# Patient Record
Sex: Female | Born: 1955 | Race: White | Hispanic: No | Smoking: Never smoker
Health system: Southern US, Community
[De-identification: ages and names within clinical notes are randomized; demographics above are authoritative.]

## PROBLEM LIST (undated history)

## (undated) DIAGNOSIS — K219 Gastro-esophageal reflux disease without esophagitis: Secondary | ICD-10-CM

## (undated) DIAGNOSIS — Z923 Personal history of irradiation: Secondary | ICD-10-CM

## (undated) DIAGNOSIS — D696 Thrombocytopenia, unspecified: Secondary | ICD-10-CM

## (undated) DIAGNOSIS — C50919 Malignant neoplasm of unspecified site of unspecified female breast: Secondary | ICD-10-CM

## (undated) DIAGNOSIS — M8580 Other specified disorders of bone density and structure, unspecified site: Secondary | ICD-10-CM

## (undated) DIAGNOSIS — D649 Anemia, unspecified: Secondary | ICD-10-CM

## (undated) DIAGNOSIS — A630 Anogenital (venereal) warts: Secondary | ICD-10-CM

## (undated) DIAGNOSIS — Z9221 Personal history of antineoplastic chemotherapy: Secondary | ICD-10-CM

## (undated) DIAGNOSIS — F32A Depression, unspecified: Secondary | ICD-10-CM

## (undated) DIAGNOSIS — C801 Malignant (primary) neoplasm, unspecified: Secondary | ICD-10-CM

## (undated) DIAGNOSIS — R569 Unspecified convulsions: Secondary | ICD-10-CM

## (undated) DIAGNOSIS — F329 Major depressive disorder, single episode, unspecified: Secondary | ICD-10-CM

## (undated) DIAGNOSIS — K635 Polyp of colon: Secondary | ICD-10-CM

## (undated) DIAGNOSIS — G40909 Epilepsy, unspecified, not intractable, without status epilepticus: Secondary | ICD-10-CM

## (undated) HISTORY — DX: Polyp of colon: K63.5

## (undated) HISTORY — DX: Unspecified convulsions: R56.9

## (undated) HISTORY — DX: Anogenital (venereal) warts: A63.0

## (undated) HISTORY — DX: Depression, unspecified: F32.A

## (undated) HISTORY — DX: Major depressive disorder, single episode, unspecified: F32.9

## (undated) HISTORY — PX: BREAST LUMPECTOMY: SHX2

## (undated) HISTORY — DX: Epilepsy, unspecified, not intractable, without status epilepticus: G40.909

## (undated) HISTORY — DX: Other specified disorders of bone density and structure, unspecified site: M85.80

---

## 1997-08-09 ENCOUNTER — Emergency Department (HOSPITAL_COMMUNITY): Admission: EM | Admit: 1997-08-09 | Discharge: 1997-08-09 | Payer: Self-pay | Admitting: Emergency Medicine

## 2000-04-08 ENCOUNTER — Encounter (INDEPENDENT_AMBULATORY_CARE_PROVIDER_SITE_OTHER): Payer: Self-pay | Admitting: Specialist

## 2000-04-08 ENCOUNTER — Other Ambulatory Visit: Admission: RE | Admit: 2000-04-08 | Discharge: 2000-04-08 | Payer: Self-pay | Admitting: *Deleted

## 2002-06-21 ENCOUNTER — Emergency Department (HOSPITAL_COMMUNITY): Admission: EM | Admit: 2002-06-21 | Discharge: 2002-06-21 | Payer: Self-pay

## 2002-07-06 ENCOUNTER — Other Ambulatory Visit: Admission: RE | Admit: 2002-07-06 | Discharge: 2002-07-06 | Payer: Self-pay | Admitting: Obstetrics and Gynecology

## 2003-07-09 ENCOUNTER — Other Ambulatory Visit: Admission: RE | Admit: 2003-07-09 | Discharge: 2003-07-09 | Payer: Self-pay | Admitting: Obstetrics and Gynecology

## 2005-01-14 ENCOUNTER — Other Ambulatory Visit: Admission: RE | Admit: 2005-01-14 | Discharge: 2005-01-14 | Payer: Self-pay | Admitting: Obstetrics and Gynecology

## 2005-04-06 ENCOUNTER — Encounter: Admission: RE | Admit: 2005-04-06 | Discharge: 2005-04-06 | Payer: Self-pay | Admitting: Gastroenterology

## 2006-01-15 ENCOUNTER — Other Ambulatory Visit: Admission: RE | Admit: 2006-01-15 | Discharge: 2006-01-15 | Payer: Self-pay | Admitting: Obstetrics and Gynecology

## 2007-08-18 ENCOUNTER — Other Ambulatory Visit: Admission: RE | Admit: 2007-08-18 | Discharge: 2007-08-18 | Payer: Self-pay | Admitting: Obstetrics and Gynecology

## 2008-11-30 ENCOUNTER — Other Ambulatory Visit: Admission: RE | Admit: 2008-11-30 | Discharge: 2008-11-30 | Payer: Self-pay | Admitting: Obstetrics and Gynecology

## 2008-11-30 ENCOUNTER — Encounter: Payer: Self-pay | Admitting: Women's Health

## 2008-11-30 ENCOUNTER — Ambulatory Visit: Payer: Self-pay | Admitting: Women's Health

## 2009-02-15 ENCOUNTER — Ambulatory Visit: Payer: Self-pay | Admitting: Women's Health

## 2010-02-07 ENCOUNTER — Ambulatory Visit: Payer: Self-pay | Admitting: Women's Health

## 2010-03-18 ENCOUNTER — Ambulatory Visit
Admission: RE | Admit: 2010-03-18 | Discharge: 2010-03-18 | Payer: Self-pay | Source: Home / Self Care | Attending: Women's Health | Admitting: Women's Health

## 2011-02-04 ENCOUNTER — Encounter: Payer: Self-pay | Admitting: *Deleted

## 2011-02-04 DIAGNOSIS — F329 Major depressive disorder, single episode, unspecified: Secondary | ICD-10-CM | POA: Insufficient documentation

## 2011-02-04 DIAGNOSIS — G40909 Epilepsy, unspecified, not intractable, without status epilepticus: Secondary | ICD-10-CM | POA: Insufficient documentation

## 2011-02-04 DIAGNOSIS — A630 Anogenital (venereal) warts: Secondary | ICD-10-CM | POA: Insufficient documentation

## 2011-02-11 ENCOUNTER — Ambulatory Visit (INDEPENDENT_AMBULATORY_CARE_PROVIDER_SITE_OTHER): Payer: Medicare Other | Admitting: Women's Health

## 2011-02-11 ENCOUNTER — Encounter: Payer: Self-pay | Admitting: Women's Health

## 2011-02-11 ENCOUNTER — Other Ambulatory Visit (HOSPITAL_COMMUNITY)
Admission: RE | Admit: 2011-02-11 | Discharge: 2011-02-11 | Disposition: A | Discharge status: Home / Self Care | Payer: Medicare Other | Source: Ambulatory Visit | Attending: Gynecology | Admitting: Gynecology

## 2011-02-11 VITALS — BP 110/60 | Ht 64.0 in | Wt 166.0 lb

## 2011-02-11 DIAGNOSIS — Z124 Encounter for screening for malignant neoplasm of cervix: Secondary | ICD-10-CM

## 2011-02-11 DIAGNOSIS — Z01419 Encounter for gynecological examination (general) (routine) without abnormal findings: Secondary | ICD-10-CM | POA: Insufficient documentation

## 2011-02-11 DIAGNOSIS — N951 Menopausal and female climacteric states: Secondary | ICD-10-CM

## 2011-02-11 MED ORDER — CONJ ESTROG-MEDROXYPROGEST ACE 0.625-2.5 MG PO TABS: 1.0000 | ORAL_TABLET | Freq: Every day | ORAL | Status: DC

## 2011-02-11 NOTE — Patient Instructions (Signed)
Schedule mammogram  Colonoscopy  Have PC get scheduled

## 2011-02-11 NOTE — Progress Notes (Signed)
Amber Bowman [birth year] [id number]    History:    The patient presents for a Pap and to discuss HRT.    Past medical history, past surgical history, family history and social history were all reviewed and documented in the EPIC chart.   ROS:  A  ROS was performed and pertinent positives and negatives are included in the history.  Exam:  Filed Vitals:   02/11/11 0906  BP: 110/60    General appearance:  Normal Head/Neck:  Normal, without cervical or supraclavicular adenopathy. Thyroid:  Symmetrical, normal in size, without palpable masses or nodularity. Respiratory  Effort:  Normal  Auscultation:  Clear without wheezing or rhonchi Cardiovascular  Auscultation:  Regular rate, without rubs, murmurs or gallops  Edema/varicosities:  Not grossly evident Abdominal  Soft,nontender, without masses, guarding or rebound.  Liver/spleen:  No organomegaly noted  Hernia:  None appreciated  Skin  Inspection:  Grossly normal  Palpation:  Grossly normal Neurologic/psychiatric  Orientation:  Normal with appropriate conversation.  Mood/affect:  Normal  Genitourinary    Breasts: Examined lying and sitting.     Right: Without masses, retractions, discharge or axillary adenopathy.     Left: Without masses, retractions, discharge or axillary adenopathy.   Inguinal/mons:  Normal without inguinal adenopathy  External genitalia:  Normal  BUS/Urethra/Skene's glands:  Normal  Bladder:  Normal  Vagina:  Normal  Cervix:  Normal  Uterus:   normal in size, shape and contour.  Midline and mobile  Adnexa/parametria:     Rt: Without masses or tenderness.   Lt: Without masses or tenderness.  Anus and perineum: Normal  Digital rectal exam: Normal sphincter tone without palpated masses or tenderness  Assessment/Plan:  55 y.o. S WF G0 for Pap and to discuss  HRT. Has a primary care doctor where she received her flu vaccine and medications for epilepsy. She's been seizure-free. States has not felt  as well on Prempro 0.45/1.5 and she did when she was on the Prempro 0.625/2.5. States would like to go back on the 0.625. States slept better, had less hot flushes, and less vaginal dryness. History of normal Paps, last Pap normal in 2010. Has not had a colonoscopy. Will discuss with primary care and get scheduled. Had a bone density in 2012 with a T score of -1.3 at femoral neck. Last mammogram in 2011, was normal.  Postmenopausal with no bleeding on HRT Epilepsy/seizure-free/medications primary care. Osteopenia/T score -1.3  Plan: Prempro 0.625/2.5, prescription, proper use, risk for blood clots, strokes, breast cancer reviewed. SBEs, instructed to schedule mammogram, reviewed importance of an annual screening. Colonoscopy will get scheduled. Encouraged daily exercise, calcium rich diet, vitamin D 2000 daily. Home safety and fall prevention discussed. Pap only   Harrington Challenger [facility], 9:45 AM 02/11/2011

## 2011-02-13 ENCOUNTER — Other Ambulatory Visit: Payer: Self-pay | Admitting: Women's Health

## 2011-03-18 ENCOUNTER — Telehealth: Payer: Self-pay | Admitting: *Deleted

## 2011-03-18 MED ORDER — ESTRADIOL-NORETHINDRONE ACET 0.5-0.1 MG PO TABS: 1.0000 | ORAL_TABLET | Freq: Every day | ORAL | Status: DC

## 2011-03-18 NOTE — Telephone Encounter (Signed)
Pt called and said that her insurance will not pay for Activella .5/.1. Pharmacy called and told her this today.

## 2011-03-18 NOTE — Telephone Encounter (Signed)
Please call patient and find out what generic  HRT insurance will pay for.

## 2011-03-18 NOTE — Telephone Encounter (Signed)
Patient had come in with insurance issue with Prempro.  Per [place] to change to generic Activella .5/.1.  Per patient ok to change and sent rx in.

## 2011-03-19 NOTE — Telephone Encounter (Signed)
Patient informed.  Will call insurance and find out more information and call us back.

## 2011-03-19 NOTE — Telephone Encounter (Signed)
Patient called back and said insurance will continue to pay for Prempro.  Patient has Prempro with her and will continue it and if there is any other issues with getting her med she will call.

## 2012-02-12 ENCOUNTER — Other Ambulatory Visit: Payer: Self-pay | Admitting: Women's Health

## 2012-02-12 ENCOUNTER — Other Ambulatory Visit: Payer: Self-pay | Admitting: Obstetrics and Gynecology

## 2012-02-12 ENCOUNTER — Ambulatory Visit (INDEPENDENT_AMBULATORY_CARE_PROVIDER_SITE_OTHER): Payer: Medicare Other | Admitting: Women's Health

## 2012-02-12 ENCOUNTER — Encounter: Payer: Self-pay | Admitting: Women's Health

## 2012-02-12 VITALS — BP 124/70 | Ht 63.0 in | Wt 171.0 lb

## 2012-02-12 DIAGNOSIS — R350 Frequency of micturition: Secondary | ICD-10-CM

## 2012-02-12 DIAGNOSIS — B373 Candidiasis of vulva and vagina: Secondary | ICD-10-CM

## 2012-02-12 DIAGNOSIS — Z1231 Encounter for screening mammogram for malignant neoplasm of breast: Secondary | ICD-10-CM

## 2012-02-12 DIAGNOSIS — N898 Other specified noninflammatory disorders of vagina: Secondary | ICD-10-CM

## 2012-02-12 LAB — URINALYSIS W MICROSCOPIC + REFLEX CULTURE
Casts: NONE SEEN
Crystals: NONE SEEN
Glucose, UA: NEGATIVE mg/dL
Hgb urine dipstick: NEGATIVE
Nitrite: NEGATIVE
Protein, ur: NEGATIVE mg/dL
Specific Gravity, Urine: 1.025 (ref 1.005–1.030)
Urobilinogen, UA: 0.2 mg/dL (ref 0.0–1.0)

## 2012-02-12 LAB — WET PREP FOR TRICH, YEAST, CLUE
Clue Cells Wet Prep HPF POC: NONE SEEN
Trich, Wet Prep: NONE SEEN

## 2012-02-12 MED ORDER — TERCONAZOLE 0.8 % VA CREA: 1.0000 | TOPICAL_CREAM | Freq: Every day | VAGINAL | Status: DC

## 2012-02-12 NOTE — Progress Notes (Signed)
Amber Bowman Mar 02, 1956 [id number]    History:    The patient presents for breast and pelvic exam. Postmenopausal with no bleeding on no HRT. History of normal mammograms and Paps. Long-term history of epilepsy, depression - doing better. Primary care labs and meds. Has not had a colonoscopy. DEXA, 03/2010 - osteopenic T score -1.3 at femoral neck, other sites normal. FRAX 6.2%/0.4%  Past medical history, past surgical history, family history and social history were all reviewed and documented in the EPIC chart.   Exam:  Filed Vitals:   02/12/12 0807  BP: 124/70    General appearance:  Normal Head/Neck:  Normal, without cervical or supraclavicular adenopathy. Thyroid:  Symmetrical, normal in size, without palpable masses or nodularity. Respiratory  Effort:  Normal  Auscultation:  Clear without wheezing or rhonchi Cardiovascular  Auscultation:  Regular rate, without rubs, murmurs or gallops  Edema/varicosities:  Not grossly evident Abdominal  Soft,nontender, without masses, guarding or rebound.  Liver/spleen:  No organomegaly noted  Hernia:  None appreciated  Skin  Inspection:  Grossly normal  Palpation:  Grossly normal Neurologic/psychiatric  Orientation:  Normal with appropriate conversation.  Mood/affect:  Normal  Genitourinary    Breasts: Examined lying and sitting.     Right: Without masses, retractions, discharge or axillary adenopathy.     Left: Without masses, retractions, discharge or axillary adenopathy.   Inguinal/mons:  Normal without inguinal adenopathy  External genitalia:  Normal  BUS/Urethra/Skene's glands:  Normal  Bladder:  Normal  Vagina:  Moderate curdy white discharge noted minimal erythema wet prep positive for yeast  Cervix:  Normal  Uterus:   normal in size, shape and contour.  Midline and mobile  Adnexa/parametria:     Rt: Without masses or tenderness.   Lt: Without masses or tenderness.  Anus and perineum: Normal  Digital rectal  exam: Normal sphincter tone without palpated masses or tenderness  Assessment/Plan:  56 y.o. G0 for breast and pelvic exam with complaint of urinary frequency.  Yeast vaginitis Postmenopausal with no bleeding/no HRT Epilepsy-depression well-controlled on medication/disability Osteopenia-T score -1.3 left femoral neck  03/2010.  Plan: SBE's, instructed to schedule screening annual mammogram. Reviewed importance of annual screen. Has not had a colonoscopy encouraged to schedule colonoscopy through primary care. Labs and meds-primary care. Terazol 3 one applicator at bedtime x3 prescription, proper use given and reviewed. Instructed to call if no relief of discharge. Urine culture pending. Reviewed importance of increasing regular exercise and decreasing calories for weight loss. Vitamin D 2000 daily and calcium rich diet encouraged. Home Hemoccult card given with instructions. Pap normal 2012, new screening guidelines reviewed.  Harrington Challenger [facility], 8:48 AM 02/12/2012

## 2012-02-12 NOTE — Patient Instructions (Signed)

## 2012-02-14 LAB — URINE CULTURE: Colony Count: >=100000

## 2012-02-16 ENCOUNTER — Other Ambulatory Visit: Payer: Self-pay | Admitting: Women's Health

## 2012-02-25 ENCOUNTER — Encounter: Payer: Self-pay | Admitting: Obstetrics and Gynecology

## 2012-03-09 DIAGNOSIS — K635 Polyp of colon: Secondary | ICD-10-CM

## 2012-03-09 HISTORY — DX: Polyp of colon: K63.5

## 2012-03-18 ENCOUNTER — Ambulatory Visit
Admission: RE | Admit: 2012-03-18 | Discharge: 2012-03-18 | Disposition: A | Discharge status: Home / Self Care | Payer: Medicare Other | Source: Ambulatory Visit | Attending: Obstetrics and Gynecology | Admitting: Obstetrics and Gynecology

## 2012-03-18 DIAGNOSIS — Z1231 Encounter for screening mammogram for malignant neoplasm of breast: Secondary | ICD-10-CM

## 2012-09-27 ENCOUNTER — Other Ambulatory Visit: Payer: Self-pay | Admitting: Neurology

## 2012-10-28 ENCOUNTER — Other Ambulatory Visit: Payer: Self-pay

## 2012-10-28 MED ORDER — TOPIRAMATE 200 MG PO TABS: 200.0000 mg | ORAL_TABLET | Freq: Two times a day (BID) | ORAL | Status: DC

## 2013-01-14 ENCOUNTER — Other Ambulatory Visit: Payer: Self-pay | Admitting: Neurology

## 2013-01-20 ENCOUNTER — Other Ambulatory Visit: Payer: Self-pay | Admitting: Neurology

## 2013-01-20 MED ORDER — ZONISAMIDE 100 MG PO CAPS: ORAL_CAPSULE | ORAL | Status: DC

## 2013-01-20 MED ORDER — FELBAMATE 600 MG PO TABS: ORAL_TABLET | ORAL | Status: DC

## 2013-01-20 MED ORDER — TOPIRAMATE 25 MG PO TABS: ORAL_TABLET | ORAL | Status: DC

## 2013-02-11 ENCOUNTER — Other Ambulatory Visit: Payer: Self-pay | Admitting: Women's Health

## 2013-02-13 ENCOUNTER — Encounter: Payer: Medicare Other | Admitting: Women's Health

## 2013-02-14 ENCOUNTER — Encounter: Payer: Self-pay | Admitting: Women's Health

## 2013-02-14 ENCOUNTER — Other Ambulatory Visit (HOSPITAL_COMMUNITY)
Admission: RE | Admit: 2013-02-14 | Discharge: 2013-02-14 | Disposition: A | Discharge status: Home / Self Care | Payer: Medicare Other | Source: Ambulatory Visit | Attending: Gynecology | Admitting: Gynecology

## 2013-02-14 ENCOUNTER — Other Ambulatory Visit: Payer: Self-pay | Admitting: Neurology

## 2013-02-14 ENCOUNTER — Ambulatory Visit (INDEPENDENT_AMBULATORY_CARE_PROVIDER_SITE_OTHER): Payer: Medicare Other | Admitting: Women's Health

## 2013-02-14 VITALS — BP 112/70 | Ht 63.0 in | Wt 166.0 lb

## 2013-02-14 DIAGNOSIS — Z124 Encounter for screening for malignant neoplasm of cervix: Secondary | ICD-10-CM

## 2013-02-14 DIAGNOSIS — B373 Candidiasis of vulva and vagina: Secondary | ICD-10-CM

## 2013-02-14 DIAGNOSIS — M8580 Other specified disorders of bone density and structure, unspecified site: Secondary | ICD-10-CM

## 2013-02-14 DIAGNOSIS — M899 Disorder of bone, unspecified: Secondary | ICD-10-CM

## 2013-02-14 DIAGNOSIS — Z79890 Hormone replacement therapy (postmenopausal): Secondary | ICD-10-CM

## 2013-02-14 LAB — WET PREP FOR TRICH, YEAST, CLUE: Clue Cells Wet Prep HPF POC: NONE SEEN

## 2013-02-14 MED ORDER — FLUCONAZOLE 150 MG PO TABS: 150.0000 mg | ORAL_TABLET | Freq: Once | ORAL | Status: DC

## 2013-02-14 MED ORDER — CONJ ESTROG-MEDROXYPROGEST ACE 0.45-1.5 MG PO TABS: 1.0000 | ORAL_TABLET | Freq: Every day | ORAL | Status: DC

## 2013-02-14 NOTE — Addendum Note (Signed)
Addended by: Richardson Chiquito on: 02/14/2013 10:30 AM   Modules accepted: Orders

## 2013-02-14 NOTE — Progress Notes (Signed)
Amber Bowman 10-Jan-1958 [id number]    History:    The patient presents for breast and pelvic exam. Postmenopausal on HRT with no bleeding. Normal Pap and mammogram history. 03/2010 Osteopenia, DEXA T score -1.3 FRAX 6.2%/0.4%. On disability depression and epilepsy, primary care manages labs and meds.   Past medical history, past surgical history, family history and social history were all reviewed and documented in the EPIC chart. Mother and sister hypertension/diabetes/heart disease both are deceased. Father hypertension.   Exam:  Filed Vitals:   58/09/14 0907  BP: 112/70    General appearance:  Normal Head/Neck:  Normal, without cervical or supraclavicular adenopathy. Thyroid:  Symmetrical, normal in size, without palpable masses or nodularity. Respiratory  Effort:  Normal  Auscultation:  Clear without wheezing or rhonchi Cardiovascular  Auscultation:  Regular rate, without rubs, murmurs or gallops  Edema/varicosities:  Not grossly evident Abdominal  Soft,nontender, without masses, guarding or rebound.  Liver/spleen:  No organomegaly noted  Hernia:  None appreciated  Skin  Inspection:  Grossly normal  Palpation:  Grossly normal Neurologic/psychiatric  Orientation:  Normal with appropriate conversation.  Mood/affect:  Normal  Genitourinary    Breasts: Examined lying and sitting.     Right: Without masses, retractions, discharge or axillary adenopathy.     Left: Without masses, retractions, discharge or axillary adenopathy.   Inguinal/mons:  Normal without inguinal adenopathy  External genitalia:  Normal  BUS/Urethra/Skene's glands:  Normal  Bladder:  Normal  Vagina:  Copious curdy discharge, wet prep positive for yeast  Cervix:  Normal  Uterus:   normal in size, shape and contour.  Midline and mobile  Adnexa/parametria:     Rt: Without masses or tenderness.   Lt: Without masses or tenderness.  Anus and perineum: Normal  Digital rectal exam: Normal sphincter tone  without palpated masses or tenderness  Assessment/Plan:  57 y.o.SWF G0  for breast and pelvic exam.  Yeast vaginitis. Osteopenia Postmenopausal/no bleeding/HRT Epilepsy/depression-primary care manages  Plan: HRT reviewed risks of blood clots, strokes, breast cancer. States has numerous hot flushes when off. Prempro 0.45/1.5 prescription, proper use given and reviewed. SBE's, continue annual mammogram, 3-D tomography reviewed and encouraged history of dense breast. Diflucan 58 times one dose prescription, proper use given and reviewed. Continue regular exercise, calcium rich diet, vitamin D 2000 daily encouraged. Pap. Pap normal 2012, new screening guidelines reviewed. Scheduled DEXA, home safety and fall prevention discussed.. Reviewed importance of screening colonoscopy instructed to schedule.   Harrington Challenger [facility], 9:51 AM 58/11/2012

## 2013-02-14 NOTE — Patient Instructions (Signed)
Monilial Vaginitis  Vaginitis in a soreness, swelling and redness (inflammation) of the vagina and vulva. Monilial vaginitis is not a sexually transmitted infection.  CAUSES   Yeast vaginitis is caused by yeast (candida) that is normally found in your vagina. With a yeast infection, the candida has overgrown in number to a point that upsets the chemical balance.  SYMPTOMS   · White, thick vaginal discharge.  · Swelling, itching, redness and irritation of the vagina and possibly the lips of the vagina (vulva).  · Burning or painful urination.  · Painful intercourse.  DIAGNOSIS   Things that may contribute to monilial vaginitis are:  · Postmenopausal and virginal states.  · Pregnancy.  · Infections.  · Being tired, sick or stressed, especially if you had monilial vaginitis in the past.  · Diabetes. Good control will help lower the chance.  · Birth control pills.  · Tight fitting garments.  · Using bubble bath, feminine sprays, douches or deodorant tampons.  · Taking certain medications that kill germs (antibiotics).  · Sporadic recurrence can occur if you become ill.  TREATMENT   Your caregiver will give you medication.  · There are several kinds of anti monilial vaginal creams and suppositories specific for monilial vaginitis. For recurrent yeast infections, use a suppository or cream in the vagina 2 times a week, or as directed.  · Anti-monilial or steroid cream for the itching or irritation of the vulva may also be used. Get your caregiver's permission.  · Painting the vagina with methylene blue solution may help if the monilial cream does not work.  · Eating yogurt may help prevent monilial vaginitis.  HOME CARE INSTRUCTIONS   · Finish all medication as prescribed.  · Do not have sex until treatment is completed or after your caregiver tells you it is okay.  · Take warm sitz baths.  · Do not douche.  · Do not use tampons, especially scented ones.  · Wear cotton underwear.  · Avoid tight pants and panty  hose.  · Tell your sexual partner that you have a yeast infection. They should go to their caregiver if they have symptoms such as mild rash or itching.  · Your sexual partner should be treated as well if your infection is difficult to eliminate.  · Practice safer sex. Use condoms.  · Some vaginal medications cause latex condoms to fail. Vaginal medications that harm condoms are:  · Cleocin cream.  · Butoconazole (Femstat®).  · Terconazole (Terazol®) vaginal suppository.  · Miconazole (Monistat®) (may be purchased over the counter).  SEEK MEDICAL CARE IF:   · You have a temperature by mouth above 102° F (38.9° C).  · The infection is getting worse after 2 days of treatment.  · The infection is not getting better after 3 days of treatment.  · You develop blisters in or around your vagina.  · You develop vaginal bleeding, and it is not your menstrual period.  · You have pain when you urinate.  · You develop intestinal problems.  · You have pain with sexual intercourse.  Document Released: 12/03/2004 Document Revised: 05/18/2011 Document Reviewed: 08/17/2008  ExitCare® Patient Information ©2014 [employer].

## 2013-02-24 ENCOUNTER — Other Ambulatory Visit: Payer: Self-pay

## 2013-02-24 DIAGNOSIS — Z1231 Encounter for screening mammogram for malignant neoplasm of breast: Secondary | ICD-10-CM

## 2013-03-13 ENCOUNTER — Other Ambulatory Visit: Payer: Self-pay | Admitting: Gynecology

## 2013-03-13 DIAGNOSIS — M8580 Other specified disorders of bone density and structure, unspecified site: Secondary | ICD-10-CM

## 2013-03-18 ENCOUNTER — Other Ambulatory Visit: Payer: Self-pay | Admitting: Neurology

## 2013-03-24 ENCOUNTER — Other Ambulatory Visit: Payer: Self-pay | Admitting: Neurology

## 2013-03-28 ENCOUNTER — Ambulatory Visit
Admission: RE | Admit: 2013-03-28 | Discharge: 2013-03-28 | Disposition: A | Discharge status: Home / Self Care | Payer: Medicare Other | Source: Ambulatory Visit

## 2013-03-28 ENCOUNTER — Other Ambulatory Visit: Payer: Self-pay | Admitting: Neurology

## 2013-03-28 DIAGNOSIS — Z1231 Encounter for screening mammogram for malignant neoplasm of breast: Secondary | ICD-10-CM

## 2013-04-05 ENCOUNTER — Other Ambulatory Visit: Payer: Self-pay | Admitting: Neurology

## 2013-04-20 ENCOUNTER — Ambulatory Visit (INDEPENDENT_AMBULATORY_CARE_PROVIDER_SITE_OTHER): Payer: Medicare Other

## 2013-04-20 DIAGNOSIS — M899 Disorder of bone, unspecified: Secondary | ICD-10-CM

## 2013-04-20 DIAGNOSIS — M8580 Other specified disorders of bone density and structure, unspecified site: Secondary | ICD-10-CM

## 2013-04-20 DIAGNOSIS — M949 Disorder of cartilage, unspecified: Secondary | ICD-10-CM

## 2013-04-26 ENCOUNTER — Other Ambulatory Visit: Payer: Self-pay | Admitting: Neurology

## 2013-04-27 ENCOUNTER — Other Ambulatory Visit: Payer: Self-pay | Admitting: Neurology

## 2013-05-01 ENCOUNTER — Other Ambulatory Visit: Payer: Self-pay | Admitting: *Deleted

## 2013-05-01 DIAGNOSIS — M898X9 Other specified disorders of bone, unspecified site: Secondary | ICD-10-CM

## 2013-05-01 DIAGNOSIS — M8580 Other specified disorders of bone density and structure, unspecified site: Secondary | ICD-10-CM

## 2013-05-03 ENCOUNTER — Other Ambulatory Visit: Payer: Medicare Other

## 2013-05-03 DIAGNOSIS — M8580 Other specified disorders of bone density and structure, unspecified site: Secondary | ICD-10-CM

## 2013-05-03 DIAGNOSIS — M898X9 Other specified disorders of bone, unspecified site: Secondary | ICD-10-CM

## 2013-05-04 LAB — VITAMIN D 25 HYDROXY (VIT D DEFICIENCY, FRACTURES): VIT D 25 HYDROXY: 32 ng/mL (ref 30–89)

## 2013-05-05 LAB — PTH, INTACT AND CALCIUM
Calcium: 9.1 mg/dL (ref 8.4–10.5)
PTH: 27.7 pg/mL (ref 14.0–72.0)

## 2013-05-18 ENCOUNTER — Encounter (INDEPENDENT_AMBULATORY_CARE_PROVIDER_SITE_OTHER): Payer: Self-pay

## 2013-05-18 ENCOUNTER — Telehealth: Payer: Self-pay | Admitting: Neurology

## 2013-05-18 ENCOUNTER — Encounter: Payer: Self-pay | Admitting: Neurology

## 2013-05-18 ENCOUNTER — Ambulatory Visit (INDEPENDENT_AMBULATORY_CARE_PROVIDER_SITE_OTHER): Payer: Medicare Other | Admitting: Neurology

## 2013-05-18 VITALS — BP 116/69 | HR 90 | Wt 166.0 lb

## 2013-05-18 DIAGNOSIS — G40909 Epilepsy, unspecified, not intractable, without status epilepticus: Secondary | ICD-10-CM

## 2013-05-18 MED ORDER — ZONISAMIDE 25 MG PO CAPS: ORAL_CAPSULE | ORAL | Status: DC

## 2013-05-18 MED ORDER — LACOSAMIDE 50 MG PO TABS: ORAL_TABLET | ORAL | Status: DC

## 2013-05-18 NOTE — Telephone Encounter (Signed)
Left message for patient to call and schedule 3-4 month follow up (ok to see NP per Dr Jannifer Franklin), could not schedule at check out since computer system was down.

## 2013-05-18 NOTE — Progress Notes (Signed)
Reason for visit: Seizures  Amber Bowman is an 58 y.o. female  History of present illness:  Amber Bowman is a 58 year old right-handed white female with a history of intractable partial complex seizures since childhood. The patient last had a seizure about one month ago that occurred without warning while she was cooking in the kitchen. The patient burned her left hand during the seizure. The patient indicates that usually, she will have an abdominal sensation prior to the seizure. The patient will then have a staring spell, or she will walk around the house. The patient indicates that she will have one or 2 such events typically in a year. The patient has never been fully controlled. The patient currently is on high dose Felbatol, maximum doses of Topamax, and she is on zonisamide. The patient has not missed any doses, and her medications have not changed in appearance recently. The patient returns to this office for an evaluation. The patient has never been set up for video EEG monitoring, and she has never been considered for possible epilepsy surgery.  Past Medical History  Diagnosis Date  . Condyloma   . Epilepsy   . Depression   . Colon polyps 2014    History reviewed. No pertinent past surgical history.  Family History  Problem Relation Age of Onset  . Hypertension Mother   . Diabetes Mother   . Heart disease Mother   . Hypertension Father   . Heart disease Father   . Diabetes Sister   . Hypertension Sister   . Heart disease Sister   . Seizures Neg Hx     Social history:  reports that she has never smoked. She has never used smokeless tobacco. She reports that she does not drink alcohol or use illicit drugs.   No Known Allergies  Medications:  Current Outpatient Prescriptions on File Prior to Visit  Medication Sig Dispense Refill  . aspirin 81 MG tablet Take 81 mg by mouth daily.        Marland Kitchen estrogen, conjugated,-medroxyprogesterone (PREMPRO) 0.45-1.5 MG per tablet  Take 1 tablet by mouth daily.  30 tablet  12  . felbamate (FELBATOL) 600 MG tablet TAKE 2 TABLETS BY MOUTH 4 TIMES A DAY  240 tablet  0  . fluconazole (DIFLUCAN) 150 MG tablet Take 1 tablet (150 mg total) by mouth once.  1 tablet  2  . Multiple Vitamins-Minerals (ICAPS AREDS FORMULA PO) Take by mouth.      Marland Kitchen omeprazole (PRILOSEC) 20 MG capsule Take 20 mg by mouth daily.      Marland Kitchen topiramate (TOPAMAX) 200 MG tablet TAKE 1 TABLET BY MOUTH TWICE A DAY  60 tablet  0  . topiramate (TOPAMAX) 25 MG tablet TAKE 1 TABLET BY MOUTH TWICE A DAY  60 tablet  0   No current facility-administered medications on file prior to visit.    ROS:  Out of a complete 14 system review of symptoms, the patient complains only of the following symptoms, and all other reviewed systems are negative.  Seizures  Blood pressure 116/69, pulse 90, weight 166 lb (75.297 kg), last menstrual period 02/11/2008.  Physical Exam  General: The patient is alert and cooperative at the time of the examination.  Skin: No significant peripheral edema is noted.   Neurologic Exam  Mental status: The patient is oriented x 3.  Cranial nerves: Facial symmetry is present. Speech is normal, no aphasia or dysarthria is noted. Extraocular movements are full. Visual fields are full.  Motor: The patient has good strength in all 4 extremities.  Sensory examination: Soft touch sensation is symmetric on the face, arms, and legs.  Coordination: The patient has good finger-nose-finger and heel-to-shin bilaterally. Some apraxia with the use of the legs is noted.  Gait and station: The patient has a normal gait. Tandem gait is normal. Romberg is negative. No drift is seen.  Reflexes: Deep tendon reflexes are symmetric.   Assessment/Plan:  1. Intractable partial complex seizures  The patient is on maximum dosing of Felbatol and Topamax. The patient is also on zonisamide, but the Topamax and zonisamide are similar medications in their mode  of activity. I will switch the patient to Vimpat, and taper the patient off of zonisamide. In the future, we may consider video EEG monitoring studies for possible epilepsy surgery. The patient will followup in 3-4 months. In the future, the 25 mg tablets of Topamax will be eliminated. Doses over 300 mg of Topamax rarely increase the effectiveness of seizure control.  Jill Alexanders MD 05/18/2013 7:17 PM  [facility] [address] [place], [place] [address]  Phone [phone] Fax [phone]

## 2013-05-19 ENCOUNTER — Telehealth: Payer: Self-pay | Admitting: *Deleted

## 2013-05-19 NOTE — Telephone Encounter (Signed)
Pt called to ask about her bone density, and that she was having hip pain, mainly with sitting and laying. I told her the BD test is not used like an xray. She cannot take OTC antiinflammatories. Advised orthopedic or Fam Md. She asked for an ortho name, I gave [facility] [employer] [facility]. She will make an apt. KW CMA

## 2013-05-27 ENCOUNTER — Other Ambulatory Visit: Payer: Self-pay | Admitting: Neurology

## 2013-08-11 ENCOUNTER — Other Ambulatory Visit: Payer: Self-pay | Admitting: Neurology

## 2013-08-11 NOTE — Telephone Encounter (Signed)
Rx signed and faxed.

## 2013-11-03 ENCOUNTER — Ambulatory Visit (INDEPENDENT_AMBULATORY_CARE_PROVIDER_SITE_OTHER): Payer: Medicare Other | Admitting: Neurology

## 2013-11-03 ENCOUNTER — Encounter: Payer: Self-pay | Admitting: Neurology

## 2013-11-03 VITALS — BP 111/73 | HR 68 | Wt 164.0 lb

## 2013-11-03 DIAGNOSIS — G40219 Localization-related (focal) (partial) symptomatic epilepsy and epileptic syndromes with complex partial seizures, intractable, without status epilepticus: Secondary | ICD-10-CM

## 2013-11-03 LAB — COMPREHENSIVE METABOLIC PANEL
ALT: 12 IU/L (ref 0–32)
AST: 14 IU/L (ref 0–40)
Albumin/Globulin Ratio: 1.4 (ref 1.1–2.5)
Albumin: 3.9 g/dL (ref 3.5–5.5)
Alkaline Phosphatase: 84 IU/L (ref 39–117)
BUN/Creatinine Ratio: 17 (ref 9–23)
BUN: 12 mg/dL (ref 6–24)
CO2: 21 mmol/L (ref 18–29)
Calcium: 8.5 mg/dL — ABNORMAL LOW (ref 8.7–10.2)
Chloride: 104 mmol/L (ref 96–108)
Creatinine, Ser: 0.7 mg/dL (ref 0.57–1.00)
GFR calc Af Amer: 110 mL/min/{1.73_m2} (ref >59)
GFR calc non Af Amer: 96 mL/min/{1.73_m2} (ref >59)
Globulin, Total: 2.8 g/dL (ref 1.5–4.5)
Glucose: 84 mg/dL (ref 65–99)
Potassium: 4.2 mmol/L (ref 3.5–5.2)
Sodium: 136 mmol/L (ref 134–144)
Total Bilirubin: 0.2 mg/dL (ref 0.0–1.2)
Total Protein: 6.7 g/dL (ref 6.0–8.5)

## 2013-11-03 LAB — CBC WITH DIFFERENTIAL
Basophils Absolute: 0 10*3/uL (ref 0.0–0.2)
Basos: 1 %
EOS ABS: 0.1 10*3/uL (ref 0.0–0.4)
Eos: 1 %
HCT: 38 % (ref 34.0–46.6)
Hemoglobin: 13.3 g/dL (ref 11.1–15.9)
Lymphocytes Absolute: 2.1 10*3/uL (ref 0.7–3.1)
Lymphs: 35 %
MCH: 31.4 pg (ref 26.6–33.0)
MCHC: 35 g/dL (ref 31.5–35.7)
MCV: 90 fL (ref 79–97)
MONOS ABS: 0.8 10*3/uL (ref 0.1–0.9)
Monocytes: 13 %
NEUTROS PCT: 50 %
Neutrophils Absolute: 3.1 10*3/uL (ref 1.4–7.0)
Platelets: 306 10*3/uL (ref 150–379)
RBC: 4.24 x10E6/uL (ref 3.77–5.28)
RDW: 12.9 % (ref 12.3–15.4)
WBC: 6.1 10*3/uL (ref 3.4–10.8)

## 2013-11-03 MED ORDER — LACOSAMIDE 150 MG PO TABS: 150.0000 mg | ORAL_TABLET | Freq: Two times a day (BID) | ORAL | Status: DC

## 2013-11-03 NOTE — Progress Notes (Signed)
Reason for visit: Seizures  Amber Bowman is an 58 y.o. female  History of present illness:  Amber Bowman is a 58 year old right-handed white female with a history of intractable partial complex epilepsy. The patient has had seizures since childhood. She is on several different antiepileptic medications at this time. She has come off of her Zonegran, and she remains on 225 mg twice daily of Topamax. The patient is on felbamate. She was recently placed on Vimpat, and she is tolerating the medication well. The patient has not had a seizure since March of 2015 when the Vimpat was started. The patient is otherwise doing quite well. 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 She denies any side effects whatsoever on her medications.  Past Medical History  Diagnosis Date  . Condyloma   . Epilepsy   . Depression   . Colon polyps 2014    History reviewed. No pertinent past surgical history.  Family History  Problem Relation Age of Onset  . Hypertension Mother   . Diabetes Mother   . Heart disease Mother   . Hypertension Father   . Heart disease Father   . Diabetes Sister   . Hypertension Sister   . Heart disease Sister   . Seizures Neg Hx     Social history:  reports that she has never smoked. She has never used smokeless tobacco. She reports that she does not drink alcohol or use illicit drugs.   No Known Allergies  Medications:  Current Outpatient Prescriptions on File Prior to Visit  Medication Sig Dispense Refill  . aspirin 81 MG tablet Take 81 mg by mouth daily.        Marland Kitchen estrogen, conjugated,-medroxyprogesterone (PREMPRO) 0.45-1.5 MG per tablet Take 1 tablet by mouth daily.  30 tablet  12  . felbamate (FELBATOL) 600 MG tablet TAKE 2 TABLETS BY MOUTH 4 TIMES A DAY  240 tablet  5  . Multiple Vitamins-Minerals (ICAPS AREDS FORMULA PO) Take by mouth.      Marland Kitchen omeprazole (PRILOSEC) 20 MG capsule Take 20 mg by mouth daily.      . silver sulfADIAZINE (SILVADENE) 1 % cream Apply 1 g  topically daily.      Marland Kitchen topiramate (TOPAMAX) 200 MG tablet TAKE 1 TABLET BY MOUTH TWICE A DAY  60 tablet  0   No current facility-administered medications on file prior to visit.    ROS:  Out of a complete 14 system review of symptoms, the patient complains only of the following symptoms, and all other reviewed systems are negative.  Seizures  Blood pressure 111/73, pulse 68, weight 164 lb (74.39 kg), last menstrual period 02/11/2008.  Physical Exam  General: The patient is alert and cooperative at the time of the examination.  Skin: No significant peripheral edema is noted.   Neurologic Exam  Mental status: The patient is oriented x 3.  Cranial nerves: Facial symmetry is present. Speech is normal, no aphasia or dysarthria is noted. Extraocular movements are full. Visual fields are full.  Motor: The patient has good strength in all 4 extremities.  Sensory examination: Soft touch sensation is symmetric on the face, arms, and legs.  Coordination: The patient has good finger-nose-finger and heel-to-shin bilaterally.  Gait and station: The patient has a normal gait. Tandem gait is normal. Romberg is negative. No drift is seen.  Reflexes: Deep tendon reflexes are symmetric.   Assessment/Plan:  One. Intractable epilepsy, partial complex  The patient will be increased on the Vimpat  taking 100 mg in the morning and 150 mg in the evening for 2 weeks, then go to 150 mg twice daily. The patient will eliminate the 25 mg tablets of the Topamax, remain on 200 mg twice daily of Topamax. The patient will followup in about 4 months. If the seizures continue, the possibility of video EEG monitoring for possible epilepsy surgery can be entertained. The patient will contact our office if she is having tolerance issues with the medication increase. The patient indicates that she does not always know when she has had a seizure if she is alone. Often times, she may get an abdominal sensation prior  to the onset of the seizure, but not always. Blood work will be checked today.   Jill Alexanders MD 11/05/2013 3:27 PM  [facility] [address] [place], [place] [address]  Phone [phone] Fax [phone]

## 2013-11-03 NOTE — Patient Instructions (Signed)
With the vimpat, begin 150 mg at night and 100 mg in the morning for 2 weeks, then start 150 mg twice a day.  You may stop the 25 mg tablets of Topamax (topiramate).  Epilepsy Epilepsy is a disorder in which a person has repeated seizures over time. A seizure is a release of abnormal electrical activity in the brain. Seizures can cause a change in attention, behavior, or the ability to remain awake and alert (altered mental status). Seizures often involve uncontrollable shaking (convulsions).  Most people with epilepsy lead normal lives. However, people with epilepsy are at an increased risk of falls, accidents, and injuries. Therefore, it is important to begin treatment right away. CAUSES  Epilepsy has many possible causes. Anything that disturbs the normal pattern of brain cell activity can lead to seizures. This may include:   Head injury.  Birth trauma.  High fever as a child.  Stroke.  Bleeding into or around the brain.  Certain drugs.  Prolonged low oxygen, such as what occurs after CPR efforts.  Abnormal brain development.  Certain illnesses, such as meningitis, encephalitis (brain infection), malaria, and other infections.  An imbalance of nerve signaling chemicals (neurotransmitters).  SIGNS AND SYMPTOMS  The symptoms of a seizure can vary greatly from one person to another. Right before a seizure, you may have a warning (aura) that a seizure is about to occur. An aura may include the following symptoms:  Fear or anxiety.  Nausea.  Feeling like the room is spinning (vertigo).  Vision changes, such as seeing flashing lights or spots. Common symptoms during a seizure include:  Abnormal sensations, such as an abnormal smell or a bitter taste in the mouth.   Sudden, general body stiffness.   Convulsions that involve rhythmic jerking of the face, arm, or leg on one or both sides.   Sudden change in consciousness.   Appearing to be awake but not responding.    Appearing to be asleep but cannot be awakened.   Grimacing, chewing, lip smacking, drooling, tongue biting, or loss of bowel or bladder control. After a seizure, you may feel sleepy for a while. DIAGNOSIS  Your health care provider will ask about your symptoms and take a medical history. Descriptions from any witnesses to your seizures will be very helpful in the diagnosis. A physical exam, including a detailed neurological exam, is necessary. Various tests may be done, such as:   An electroencephalogram (EEG). This is a painless test of your brain waves. In this test, a diagram is created of your brain waves. These diagrams can be interpreted by a specialist.  An MRI of the brain.   A CT scan of the brain.   A spinal tap (lumbar puncture, LP).  Blood tests to check for signs of infection or abnormal blood chemistry. TREATMENT  There is no cure for epilepsy, but it is generally treatable. Once epilepsy is diagnosed, it is important to begin treatment as soon as possible. For most people with epilepsy, seizures can be controlled with medicines. The following may also be used:  A pacemaker for the brain (vagus nerve stimulator) can be used for people with seizures that are not well controlled by medicine.  Surgery on the brain. For some people, epilepsy eventually goes away. HOME CARE INSTRUCTIONS   Follow your health care provider's recommendations on driving and safety in normal activities.  Get enough rest. Lack of sleep can cause seizures.  Only take over-the-counter or prescription medicines as directed by your health  care provider. Take any prescribed medicine exactly as directed.  Avoid any known triggers of your seizures.  Keep a seizure diary. Record what you recall about any seizure, especially any possible trigger.   Make sure the people you live and work with know that you are prone to seizures. They should receive instructions on how to help you. In general, a  witness to a seizure should:   Cushion your head and body.   Turn you on your side.   Avoid unnecessarily restraining you.   Not place anything inside your mouth.   Call for emergency medical help if there is any question about what has occurred.   Follow up with your health care provider as directed. You may need regular blood tests to monitor the levels of your medicine.  SEEK MEDICAL CARE IF:   You develop signs of infection or other illness. This might increase the risk of a seizure.   You seem to be having more frequent seizures.   Your seizure pattern is changing.  SEEK IMMEDIATE MEDICAL CARE IF:   You have a seizure that does not stop after a few moments.   You have a seizure that causes any difficulty in breathing.   You have a seizure that results in a very severe headache.   You have a seizure that leaves you with the inability to speak or use a part of your body.  Document Released: 02/23/2005 Document Revised: 12/14/2012 Document Reviewed: 10/05/2012 [facility] Patient Information 2015 [place], [place]. This information is not intended to replace advice given to you by your health care provider. Make sure you discuss any questions you have with your health care provider.

## 2013-11-06 ENCOUNTER — Telehealth: Payer: Self-pay | Admitting: *Deleted

## 2013-11-06 NOTE — Telephone Encounter (Signed)
    Please call the patient. Blood work was relatively unremarkable, minimally low calcium level, not clinically significant.

## 2013-11-14 ENCOUNTER — Other Ambulatory Visit: Payer: Self-pay | Admitting: Neurology

## 2014-02-15 ENCOUNTER — Encounter: Payer: Self-pay | Admitting: Women's Health

## 2014-02-15 ENCOUNTER — Ambulatory Visit (INDEPENDENT_AMBULATORY_CARE_PROVIDER_SITE_OTHER): Payer: Medicare Other | Admitting: Women's Health

## 2014-02-15 VITALS — BP 126/80 | Ht 64.0 in | Wt 163.0 lb

## 2014-02-15 DIAGNOSIS — N3281 Overactive bladder: Secondary | ICD-10-CM

## 2014-02-15 DIAGNOSIS — Z79890 Hormone replacement therapy (postmenopausal): Secondary | ICD-10-CM

## 2014-02-15 DIAGNOSIS — B3731 Acute candidiasis of vulva and vagina: Secondary | ICD-10-CM

## 2014-02-15 DIAGNOSIS — B373 Candidiasis of vulva and vagina: Secondary | ICD-10-CM

## 2014-02-15 LAB — WET PREP FOR TRICH, YEAST, CLUE
Clue Cells Wet Prep HPF POC: NONE SEEN
TRICH WET PREP: NONE SEEN

## 2014-02-15 MED ORDER — OXYBUTYNIN CHLORIDE 5 MG PO TABS: 5.0000 mg | ORAL_TABLET | Freq: Three times a day (TID) | ORAL | Status: DC

## 2014-02-15 MED ORDER — FLUCONAZOLE 150 MG PO TABS: 150.0000 mg | ORAL_TABLET | Freq: Once | ORAL | Status: DC

## 2014-02-15 MED ORDER — CONJ ESTROG-MEDROXYPROGEST ACE 0.45-1.5 MG PO TABS: 1.0000 | ORAL_TABLET | Freq: Every day | ORAL | Status: DC

## 2014-02-15 NOTE — Progress Notes (Signed)
Amber Bowman [birth year] [id number]    History:    Presents for breast and pelvic exam. Postmenopausal on HRT with no bleeding. Normal Pap and mammogram history. 2015 T score -1.6 femoral neck FRAX 7.7%/0.7% stable. History of epilepsy and depression neurologist working with medications. Urinary frequency without infection, nocturia 4 most nights.  Past medical history, past surgical history, family history and social history were all reviewed and documented in the EPIC chart. On disability. Mother, sister heart disease diabetes hypertension both deceased. Father hypertension.  ROS:  A  12 point ROS was performed and pertinent positives and negatives are included.  Exam:  Filed Vitals:   02/15/14 0925  BP: 126/80    General appearance:  Normal Thyroid:  Symmetrical, normal in size, without palpable masses or nodularity. Respiratory  Auscultation:  Clear without wheezing or rhonchi Cardiovascular  Auscultation:  Regular rate, without rubs, murmurs or gallops  Edema/varicosities:  Not grossly evident Abdominal  Soft,nontender, without masses, guarding or rebound.  Liver/spleen:  No organomegaly noted  Hernia:  None appreciated  Skin  Inspection:  Grossly normal   Breasts: Examined lying and sitting.     Right: Without masses, retractions, discharge or axillary adenopathy.     Left: Without masses, retractions, discharge or axillary adenopathy. Gentitourinary   Inguinal/mons:  Normal without inguinal adenopathy  External genitalia:  Normal  BUS/Urethra/Skene's glands:  Normal  Vagina:  Normal  Cervix:  Normal  Uterus:  normal in size, shape and contour.  Midline and mobile  Adnexa/parametria:     Rt: Without masses or tenderness.   Lt: Without masses or tenderness.  Anus and perineum: Normal  Digital rectal exam: Normal sphincter tone without palpated masses or tenderness  Assessment/Plan:  58 y.o. MWF G0 for breast and pelvic exam with complaint of urinary frequency  without infection.  Postmenopausal/no bleeding/HRT Osteopenia without elevated FRAX Primary care manages labs and meds Epilepsy/depression-neurologist Overactive bladder  Plan: HRT reviewed risks of blood clots, strokes and breast cancer except states continues to have some hot flashes prefers to continue, prescription for Prempro 0.45/1.5 prescription, proper use given and reviewed. Reviewed need to taper or decrease next year. SBE's, continue annual 3-D tomography, history of dense breast. Increase regular exercise, calcium rich diet, vitamin D 2000 daily. Home safety and fall prevention discussed. Pap normal 2014 new screening guidelines reviewed. Options reviewed, will try Ditropan  5 mg late evening, reviewed risks of dry mouth and constipation. Instructed to call if no relief reviewed will start slow. Denies history of glaucoma.  Huel Cote WHNP, 5:20 PM 02/15/2014

## 2014-02-15 NOTE — Patient Instructions (Signed)
Overactive Bladder The bladder has two functions that are totally opposite of the other. One is to relax and stretch out so it can store urine (fills like a balloon), and the other is to contract and squeeze down so that it can empty the urine that it has stored. Proper functioning of the bladder is a complex mixing of these two functions. The filling and emptying of the bladder can be influenced by:  The bladder.  The spinal cord.  The brain.  The nerves going to the bladder.  Other organs that are closely related to the bladder such as prostate in males and the vagina in females. As your bladder fills with urine, nerve signals are sent from the bladder to the brain to tell you that you may need to urinate. Normal urination requires that the bladder squeeze down with sufficient strength to empty the bladder, but this also requires that the bladder squeeze down sufficiently long to finish the job. In addition the sphincter muscles, which normally keep you from leaking urine, must also relax so that the urine can pass. Coordination between the bladder muscle squeezing down and the sphincter muscles relaxing is required to make everything happen normally. With an overactive bladder sometimes the muscles of the bladder contract unexpectedly and involuntarily and this causes an urgent need to urinate. The normal response is to try to hold urine in by contracting the sphincter muscles. Sometimes the bladder contracts so strongly that the sphincter muscles cannot stop the urine from passing out and incontinence occurs. This kind of incontinence is called urge incontinence. Having an overactive bladder can be embarrassing and awkward. It can keep you from living life the way you want to. Many people think it is just something you have to put up with as you grow older or have certain health conditions. In fact, there are treatments that can help make your life easier and more pleasant. CAUSES  Many things  can cause an overactive bladder. Possibilities include:  Urinary tract infection or infection of nearby tissues such as the prostate.  Prostate enlargement.  In women, multiple pregnancies or surgery on the uterus or urethra.  Bladder stones, inflammation, or tumors.  Caffeine.  Alcohol.  Medications. For example, diuretics (drugs that help the body get rid of extra fluid) increase urine production. Some other medicines must be taken with lots of fluids.  Muscle or nerve weakness. This might be the result of a spinal cord injury, a stroke, multiple sclerosis, or Parkinson disease.  Diabetes can cause a high urine volume which fills the bladder so quickly that the normal urge to urinate is triggered very strongly. SYMPTOMS   Loss of bladder control. You feel the need to urinate and cannot make your body wait.  Sudden, strong urges to urinate.  Urinating 8 or more times a day.  Waking up to urinate two or more times a night. DIAGNOSIS  To decide if you have overactive bladder, your health care provider will probably:  Ask about symptoms you have noticed.  Ask about your overall health. This will include questions about any medications you are taking.  Do a physical examination. This will help determine if there are obvious blockages or other problems.  Order some tests. These might include:  A blood test to check for diabetes or other health issues that could be contributing to the problem.  Urine testing. This could measure the flow of urine and the pressure on the bladder.  A test of your neurological   system (the brain, spinal cord, and nerves). This is the system that senses the need to urinate. Some of these tests are called flow tests, bladder pressure tests, and electrical measurements of the sphincter muscle.  A bladder test to check whether it is emptying completely when you urinate.  Cystoscopy. This test uses a thin tube with a tiny camera on it. It offers a  look inside your urethra and bladder to see if there are problems.  Imaging tests. You might be given a contrast dye and then asked to urinate. X-rays are taken to see how your bladder is working. TREATMENT  An overactive bladder can be treated in many ways. The treatment will depend on the cause. Whether you have a mild or severe case also makes a difference. Often, treatment can be given in your health care provider's office or clinic. Be sure to discuss the different options with your caregiver. They include:  Behavioral treatments. These do not involve medication or surgery:  Bladder training. For this, you would follow a schedule to urinate at regular intervals. This helps you learn to control the urge to urinate. At first, you might be asked to wait a few minutes after feeling the urge. In time, you should be able to schedule bathroom visits an hour or more apart.  Kegel exercises. These exercises strengthen the pelvic floor muscles, which support the bladder. Toning these muscles can help control urination even if the bladder muscles are overactive. A specialist will teach you how to do these exercises correctly. They will require daily practice.  Weight loss. If you are obese or overweight, losing weight might stop your bladder from being overactive. Talk to your health care provider about how many pounds you should lose. Also ask if there is a specific program or method that would work best for you.  Diet change. This might be suggested if constipation is making your overactive bladder worse. Your health care provider or a nutritionist can explain ways to change what you eat to ease constipation. Other people might need to take in less caffeine or alcohol. Sometimes drinking fewer fluids is needed, too.  Protection. This is not an actual treatment. But, you could wear special pads to take care of any leakage while you wait for other treatments to take effect. This will help you avoid  embarrassment.  Physical treatments.  Electrical stimulation. Electrodes will send gentle pulses to the nerves or muscles that help control the bladder. The goal is to strengthen them. Sometimes this is done with the electrodes outside the body. Or, they might be placed inside the body (implanted). This treatment can take several months to have an effect.  Medications. These are usually used along with other treatments. Several medicines are available. Some are injected into the muscles involved in urination. Others come in pill form. Medications sometimes prescribed include:  Anticholinergics. These drugs block the signals that the nerves deliver to the bladder. This keeps it from releasing urine at the wrong time. Researchers think the drugs might help in other ways, too.  Imipramine. This is an antidepressant. But, it relaxes bladder muscles.  Botox. This is still experimental. Some people believe that injecting it into the bladder muscles will relax them so they work more normally. It has also been injected into the sphincter muscle when the sphincter muscle does not open properly. This is a temporary fix, however. Also, it might make matters worse, especially in older people.  Surgery.  A device might be implanted   to help manage your nerves. It works on the nerves that signal when you need to urinate.  Surgery is sometimes needed with electrical stimulation. If the electrodes are implanted, this is done through surgery.  Sometimes repairs need to be made through surgery. For example, the size of the bladder can be changed. This is usually done in severe cases only. HOME CARE INSTRUCTIONS   Take any medications your health care provider prescribed or suggested. Follow the directions carefully.  Practice any lifestyle changes that are recommended. These might include:  Drinking less fluid or drinking at different times of the day. If you need to urinate often during the night, for  example, you may need to stop drinking fluids early in the evening.  Cutting down on caffeine or alcohol. They can both make an overactive bladder worse. Caffeine is found in coffee, tea, and sodas.  Doing Kegel exercises to strengthen muscles.  Losing weight, if that is recommended.  Eating a healthy and balanced diet. This will help you avoid constipation.  Keep a journal or a log. You might be asked to record how much you drink and when, and also when you feel the need to urinate.  Learn how to care for implants or other devices, such as pessaries. SEEK MEDICAL CARE IF:   Your overactive bladder gets worse.  You feel increased pain or irritation when you urinate.  You notice blood in your urine.  You have questions about any medications or devices that your health care provider recommended.  You notice blood, pus, or swelling at the site of any test or treatment procedure.  You have an oral temperature above 102F (38.9C). SEEK IMMEDIATE MEDICAL CARE IF:  You have an oral temperature above 102F (38.9C), not controlled by medicine. Document Released: 12/20/2008 Document Revised: 07/10/2013 Document Reviewed: 12/20/2008 [employer] Patient Information 2015 [employer]. This information is not intended to replace advice given to you by your health care provider. Make sure you discuss any questions you have with your health care provider.  

## 2014-03-03 ENCOUNTER — Other Ambulatory Visit: Payer: Self-pay | Admitting: Neurology

## 2014-03-05 ENCOUNTER — Other Ambulatory Visit: Payer: Self-pay

## 2014-03-05 DIAGNOSIS — Z79890 Hormone replacement therapy (postmenopausal): Secondary | ICD-10-CM

## 2014-03-05 MED ORDER — CONJ ESTROG-MEDROXYPROGEST ACE 0.45-1.5 MG PO TABS: 1.0000 | ORAL_TABLET | Freq: Every day | ORAL | Status: DC

## 2014-03-05 MED ORDER — LACOSAMIDE 150 MG PO TABS: 150.0000 mg | ORAL_TABLET | Freq: Two times a day (BID) | ORAL | Status: DC

## 2014-03-05 NOTE — Telephone Encounter (Signed)
Rx signed and faxed.

## 2014-03-06 ENCOUNTER — Ambulatory Visit (INDEPENDENT_AMBULATORY_CARE_PROVIDER_SITE_OTHER): Payer: Medicare Other | Admitting: Adult Health

## 2014-03-06 ENCOUNTER — Encounter: Payer: Self-pay | Admitting: Adult Health

## 2014-03-06 ENCOUNTER — Telehealth: Payer: Self-pay | Admitting: *Deleted

## 2014-03-06 VITALS — BP 118/68 | HR 70 | Ht 64.0 in | Wt 167.8 lb

## 2014-03-06 DIAGNOSIS — G40219 Localization-related (focal) (partial) symptomatic epilepsy and epileptic syndromes with complex partial seizures, intractable, without status epilepticus: Secondary | ICD-10-CM

## 2014-03-06 DIAGNOSIS — Z5181 Encounter for therapeutic drug level monitoring: Secondary | ICD-10-CM

## 2014-03-06 NOTE — Progress Notes (Signed)
PATIENT: Amber Bowman DOB: Jul 31, 1955  REASON FOR VISIT: follow up HISTORY FROM: patient  HISTORY OF PRESENT ILLNESS: Amber Bowman is a 58 year old female with a history of seizures. She returns today for follow-up. She is currently taking Topamax 200 mg twice a day, Felbamate and vimpat. She states that she is tolerating this medication well. She denies any recent seizures. She does not operate a motor vehicle. She is able to complete all ADLs independently. Denies any trouble with balance or gait.                                                                                                                                                HISTORY 11/03/13 (Bowman): Amber Bowman is a 58 year old right-handed white female with a history of intractable partial complex epilepsy. The patient has had seizures since childhood. She is on several different antiepileptic medications at this time. She has come off of her Zonegran, and she remains on 225 mg twice daily of Topamax. The patient is on felbamate. She was recently placed on Vimpat, and she is tolerating the medication well. The patient has not had a seizure since March of 2015 when the Vimpat was started. The patient is otherwise doing quite well. 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 She denies any side effects whatsoever on her medications  REVIEW OF SYSTEMS: Out of a complete 14 system review of symptoms, the patient complains only of the following symptoms, and all other reviewed systems are negative  Blurred vision  ALLERGIES: No Known Allergies  HOME MEDICATIONS: Outpatient Prescriptions Prior to Visit  Medication Sig Dispense Refill  . aspirin 81 MG tablet Take 81 mg by mouth daily.      Marland Kitchen estrogen, conjugated,-medroxyprogesterone (PREMPRO) 0.45-1.5 MG per tablet Take 1 tablet by mouth daily. 30 tablet 12  . felbamate (FELBATOL) 600 MG tablet TAKE 2 TABLETS BY MOUTH 4 TIMES A DAY 240 tablet 6  . Multiple Vitamins-Minerals (ICAPS  AREDS FORMULA PO) Take by mouth.    Marland Kitchen omeprazole (PRILOSEC) 20 MG capsule Take 20 mg by mouth daily.    Marland Kitchen oxybutynin (DITROPAN) 5 MG tablet Take 1 tablet (5 mg total) by mouth 3 (three) times daily. Take HS 30 tablet 1  . topiramate (TOPAMAX) 200 MG tablet TAKE 1 TABLET BY MOUTH TWICE A DAY 60 tablet 0  . fluconazole (DIFLUCAN) 150 MG tablet Take 1 tablet (150 mg total) by mouth once. (Patient not taking: Reported on 03/06/2014) 1 tablet 1  . Lacosamide (VIMPAT) 150 MG TABS Take 1 tablet (150 mg total) by mouth 2 (two) times daily. (Patient not taking: Reported on 03/06/2014) 60 tablet 5  . silver sulfADIAZINE (SILVADENE) 1 % cream Apply 1 g topically daily.     No facility-administered medications prior to visit.    PAST MEDICAL HISTORY: Past Medical History  Diagnosis  Date  . Condyloma   . Epilepsy   . Depression   . Colon polyps 2014    PAST SURGICAL HISTORY: History reviewed. No pertinent past surgical history.  FAMILY HISTORY: Family History  Problem Relation Age of Onset  . Hypertension Mother   . Diabetes Mother   . Heart disease Mother   . Hypertension Father   . Heart disease Father   . Diabetes Sister   . Hypertension Sister   . Heart disease Sister   . Seizures Neg Hx     SOCIAL HISTORY: History   Social History  . Marital Status: Legally Separated    Spouse Name: N/A    Number of Children: N/A  . Years of Education: N/A   Occupational History  . Not on file.   Social History Main Topics  . Smoking status: Never Smoker   . Smokeless tobacco: Never Used  . Alcohol Use: No  . Drug Use: No  . Sexual Activity:    Partners: Male    Birth Control/ Protection: Post-menopausal   Other Topics Concern  . Not on file   Social History Narrative      PHYSICAL EXAM  Filed Vitals:   03/06/14 0955  BP: 118/68  Pulse: 70  Height: 5\' 4"  (1.626 m)  Weight: 167 lb 12.8 oz (76.114 kg)   Body mass index is 28.79 kg/(m^2).  Generalized: Well developed,  in no acute distress   Neurological examination  Mentation: Alert oriented to time, place, history taking. Follows all commands speech and language fluent Cranial nerve II-XII: Pupils were equal round reactive to light. Extraocular movements were full, visual field were full on confrontational test. Facial sensation and strength were normal. Uvula tongue midline. Head turning and shoulder shrug  were normal and symmetric. Motor: The motor testing reveals 5 over 5 strength of all 4 extremities. Good symmetric motor tone is noted throughout.  Sensory: Sensory testing is intact to soft touch on all 4 extremities. No evidence of extinction is noted.  Coordination: Cerebellar testing reveals good finger-nose-finger and heel-to-shin bilaterally.  Gait and station: Gait is normal. Tandem gait is normal. Romberg is negative. No drift is seen.  Reflexes: Deep tendon reflexes are symmetric and normal bilaterally.    DIAGNOSTIC DATA (LABS, IMAGING, TESTING) - I reviewed patient records, labs, notes, testing and imaging myself where available.  Lab Results  Component Value Date   WBC 6.1 11/03/2013   HGB 13.3 11/03/2013   HCT 38.0 11/03/2013   MCV 90 11/03/2013   PLT 306 11/03/2013      Component Value Date/Time   NA 136 11/03/2013 0933   K 4.2 11/03/2013 0933   CL 104 11/03/2013 0933   CO2 21 11/03/2013 0933   GLUCOSE 84 11/03/2013 0933   BUN 12 11/03/2013 0933   CREATININE 0.70 11/03/2013 0933   CALCIUM 8.5* 11/03/2013 0933   PROT 6.7 11/03/2013 0933   AST 14 11/03/2013 0933   ALT 12 11/03/2013 0933   ALKPHOS 84 11/03/2013 0933   BILITOT 0.2 11/03/2013 0933   GFRNONAA 96 11/03/2013 0933   GFRAA 110 11/03/2013 0933      ASSESSMENT AND PLAN 58 y.o. year old female  has a past medical history of Condyloma; Epilepsy; Depression; and Colon polyps (2014). here with:  1. Seizures  Patient denies any recent seizures. She will continue Topamax, felbamate and Vimpat. I will check blood  work today- CBC and CMP If she has any additional seizures she should let us know.  Otherwise she will follow up in 6 months or sooner if needed.  Amber Givens, MSN, NP-C 03/06/2014, 10:13 AM [facility] [address] [place], [place] [address] [phone]  Note: This document was prepared with digital dictation and possible smart phrase technology. Any transcriptional errors that result from this process are unintentional.

## 2014-03-06 NOTE — Patient Instructions (Signed)
Seizure, Adult A seizure means there is unusual activity in the brain. A seizure can cause changes in attention or behavior. Seizures often cause shaking (convulsions). Seizures often last from 30 seconds to 2 minutes. HOME CARE   If you are given medicines, take them exactly as told by your doctor.  Keep all doctor visits as told.  Do not swim or drive until your doctor says it is okay.  Teach others what to do if you have a seizure. They should:  Lay you on the ground.  Put a cushion under your head.  Loosen any tight clothing around your neck.  Turn you on your side.  Stay with you until you get better. GET HELP RIGHT AWAY IF:   The seizure lasts longer than 2 to 5 minutes.  The seizure is very bad.  The person does not wake up after the seizure.  The person's attention or behavior changes. Drive the person to the emergency room or call your local emergency services (911 in [place] MAKE SURE YOU:   Understand these instructions.  Will watch your condition.  Will get help right away if you are not doing well or get worse. Document Released: 08/12/2007 Document Revised: 05/18/2011 Document Reviewed: 02/11/2011 ExitCare Patient Information 2015 [employer]. This information is not intended to replace advice given to you by your health care provider. Make sure you discuss any questions you have with your health care provider.  

## 2014-03-06 NOTE — Telephone Encounter (Signed)
Pt called requesting Rx for Ditropan 5 mg Rx is working very well. I called pt and left on her voicemail that Rx was sent on OV 02/15/14

## 2014-03-06 NOTE — Progress Notes (Signed)
I have read the note, and I agree with the clinical assessment and plan.  [name],CHARLES KEITH   

## 2014-03-07 ENCOUNTER — Telehealth: Payer: Self-pay | Admitting: *Deleted

## 2014-03-07 LAB — COMPREHENSIVE METABOLIC PANEL
ALT: 8 IU/L (ref 0–32)
AST: 13 IU/L (ref 0–40)
Albumin/Globulin Ratio: 1.6 (ref 1.1–2.5)
Albumin: 4.2 g/dL (ref 3.5–5.5)
Alkaline Phosphatase: 93 IU/L (ref 39–117)
BUN/Creatinine Ratio: 14 (ref 9–23)
BUN: 11 mg/dL (ref 6–24)
CO2: 17 mmol/L — AB (ref 18–29)
Calcium: 9.1 mg/dL (ref 8.7–10.2)
Chloride: 107 mmol/L (ref 97–108)
Creatinine, Ser: 0.81 mg/dL (ref 0.57–1.00)
GFR calc Af Amer: 92 mL/min/{1.73_m2} (ref >59)
GFR calc non Af Amer: 80 mL/min/{1.73_m2} (ref >59)
GLUCOSE: 102 mg/dL — AB (ref 65–99)
Globulin, Total: 2.6 g/dL (ref 1.5–4.5)
POTASSIUM: 3.9 mmol/L (ref 3.5–5.2)
SODIUM: 140 mmol/L (ref 134–144)
Total Bilirubin: <0.2 mg/dL (ref 0.0–1.2)
Total Protein: 6.8 g/dL (ref 6.0–8.5)

## 2014-03-07 LAB — CBC WITH DIFFERENTIAL
BASOS ABS: 0.1 10*3/uL (ref 0.0–0.2)
BASOS: 1 %
Eos: 1 %
Eosinophils Absolute: 0.1 10*3/uL (ref 0.0–0.4)
HEMATOCRIT: 42.2 % (ref 34.0–46.6)
HEMOGLOBIN: 13.5 g/dL (ref 11.1–15.9)
Immature Grans (Abs): 0 10*3/uL (ref 0.0–0.1)
Immature Granulocytes: 0 %
LYMPHS: 36 %
Lymphocytes Absolute: 2.4 10*3/uL (ref 0.7–3.1)
MCH: 30.5 pg (ref 26.6–33.0)
MCHC: 32 g/dL (ref 31.5–35.7)
MCV: 95 fL (ref 79–97)
Monocytes Absolute: 0.7 10*3/uL (ref 0.1–0.9)
Monocytes: 10 %
NEUTROS ABS: 3.4 10*3/uL (ref 1.4–7.0)
NEUTROS PCT: 52 %
Platelets: 345 10*3/uL (ref 150–379)
RBC: 4.43 x10E6/uL (ref 3.77–5.28)
RDW: 13.7 % (ref 12.3–15.4)
WBC: 6.5 10*3/uL (ref 3.4–10.8)

## 2014-03-07 NOTE — Telephone Encounter (Signed)
Called the patient and left a vm about the results of her lab

## 2014-03-14 ENCOUNTER — Other Ambulatory Visit: Payer: Self-pay | Admitting: Women's Health

## 2014-03-14 ENCOUNTER — Other Ambulatory Visit: Payer: Self-pay

## 2014-03-14 DIAGNOSIS — N3281 Overactive bladder: Secondary | ICD-10-CM

## 2014-03-14 MED ORDER — OXYBUTYNIN CHLORIDE 5 MG PO TABS: ORAL_TABLET | ORAL | Status: DC

## 2014-04-30 ENCOUNTER — Other Ambulatory Visit: Payer: Self-pay

## 2014-04-30 DIAGNOSIS — N3281 Overactive bladder: Secondary | ICD-10-CM

## 2014-04-30 MED ORDER — OXYBUTYNIN CHLORIDE 5 MG PO TABS: ORAL_TABLET | ORAL | Status: DC

## 2014-05-28 ENCOUNTER — Other Ambulatory Visit: Payer: Self-pay

## 2014-05-28 ENCOUNTER — Telehealth: Payer: Self-pay | Admitting: *Deleted

## 2014-05-28 DIAGNOSIS — Z1239 Encounter for other screening for malignant neoplasm of breast: Secondary | ICD-10-CM

## 2014-05-28 NOTE — Telephone Encounter (Signed)
PA form filled out and faxed insurance, will wait for response.

## 2014-05-31 DIAGNOSIS — E559 Vitamin D deficiency, unspecified: Secondary | ICD-10-CM | POA: Insufficient documentation

## 2014-06-04 NOTE — Telephone Encounter (Signed)
Rx denied covered alternate drug is Jinteli brand or generic.

## 2014-06-26 ENCOUNTER — Ambulatory Visit: Payer: Medicare Other

## 2014-07-02 ENCOUNTER — Other Ambulatory Visit: Payer: Self-pay | Admitting: Neurology

## 2014-07-02 ENCOUNTER — Encounter: Payer: Self-pay | Admitting: Neurology

## 2014-07-02 ENCOUNTER — Telehealth: Payer: Self-pay | Admitting: *Deleted

## 2014-07-02 NOTE — Telephone Encounter (Signed)
Form, Chief Operating Officer sent to [place] and Dr Jannifer Franklin 07/02/14.

## 2014-07-03 ENCOUNTER — Telehealth: Payer: Self-pay | Admitting: *Deleted

## 2014-07-03 NOTE — Telephone Encounter (Signed)
Carolin Sicks Summons received,completed by Dr Jannifer Franklin and Charisse Klinefelter at front desk for patient 07-03-14.

## 2014-07-03 NOTE — Telephone Encounter (Signed)
Dr. Jannifer Franklin wrote the letter. Letter given to Butch Penny.

## 2014-07-17 ENCOUNTER — Ambulatory Visit
Admission: RE | Admit: 2014-07-17 | Discharge: 2014-07-17 | Disposition: A | Discharge status: Home / Self Care | Payer: Medicare Other | Source: Ambulatory Visit

## 2014-07-17 ENCOUNTER — Ambulatory Visit: Payer: Medicare Other

## 2014-07-17 DIAGNOSIS — Z1239 Encounter for other screening for malignant neoplasm of breast: Secondary | ICD-10-CM

## 2014-08-28 ENCOUNTER — Other Ambulatory Visit: Payer: Self-pay | Admitting: Neurology

## 2014-08-28 NOTE — Telephone Encounter (Signed)
Rx signed and faxed.

## 2014-09-11 ENCOUNTER — Ambulatory Visit (INDEPENDENT_AMBULATORY_CARE_PROVIDER_SITE_OTHER): Payer: Medicare Other | Admitting: Adult Health

## 2014-09-11 ENCOUNTER — Encounter: Payer: Self-pay | Admitting: Adult Health

## 2014-09-11 VITALS — BP 110/70 | HR 80 | Ht 64.0 in | Wt 175.0 lb

## 2014-09-11 DIAGNOSIS — G40219 Localization-related (focal) (partial) symptomatic epilepsy and epileptic syndromes with complex partial seizures, intractable, without status epilepticus: Secondary | ICD-10-CM

## 2014-09-11 DIAGNOSIS — Z5181 Encounter for therapeutic drug level monitoring: Secondary | ICD-10-CM | POA: Diagnosis not present

## 2014-09-11 NOTE — Patient Instructions (Signed)
Will check blood work today. Continue all medications. If you have another seizure please let us know.

## 2014-09-11 NOTE — Progress Notes (Signed)
I have read the note, and I agree with the clinical assessment and plan.  [name],CHARLES KEITH   

## 2014-09-11 NOTE — Progress Notes (Signed)
PATIENT: Amber Bowman DOB: Jul 11, 1955  REASON FOR VISIT: follow up- seizures HISTORY FROM: patient  HISTORY OF PRESENT ILLNESS: Amber Bowman is a 59 -year-old female with a history of seizures. She returns today for follow-up. She is currently taking Topamax, Felbamate and Vimpat. She is tolerating these medications well. She states that her fianc told her that she had 2 seizures. However she cannot recall when the seizures happened. She states that the seizures consist of her staring off and she may start wandering around the house. She states that her fianc will try to talk to her but she does not answer. She states that this may go on for 5-10 minutes and then she returns to her baseline. She denies missing any medication. She does state that during this time she did have a death in her family. And in the past high stress has triggered her seizures. She states that since then she's not had a recurrence of her seizure. She states that in the past when she was on a higher dose of Vimpat that caused her to feel dizzy and fall. She states that she tolerates her current dose well. Denies any trouble with her gait. Denies any falls. She does not operate a motor vehicle. She is able to complete all ADLs independently. She states that she typically does not cook meals. She returns today for an evaluation.  HISTORY 03/06/14: Amber Bowman is a 59 year old female with a history of seizures. She returns today for follow-up. She is currently taking Topamax 200 mg twice a day, Felbamate and vimpat. She states that she is tolerating this medication well. She denies any recent seizures. She does not operate a motor vehicle. She is able to complete all ADLs independently. Denies any trouble with balance or gait.    HISTORY 11/03/13 ([name]): Amber Bowman is a 59 year old  right-handed white female with a history of intractable partial complex epilepsy. The patient has had seizures since childhood. She is on several different antiepileptic medications at this time. She has come off of her Zonegran, and she remains on 225 mg twice daily of Topamax. The patient is on felbamate. She was recently placed on Vimpat, and she is tolerating the medication well. The patient has not had a seizure since March of 2015 when the Vimpat was started. The patient is otherwise doing quite well. 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 She denies any side effects whatsoever on her medications  REVIEW OF SYSTEMS: Out of a complete 14 system review of symptoms, the patient complains only of the following symptoms, and all other reviewed systems are negative.  Double vision, blurred vision, seizure  ALLERGIES: No Known Allergies  HOME MEDICATIONS: Outpatient Prescriptions Prior to Visit  Medication Sig Dispense Refill  . aspirin 81 MG tablet Take 81 mg by mouth daily.      . felbamate (FELBATOL) 600 MG tablet TAKE 2 TABLETS BY MOUTH 4 TIMES A DAY 240 tablet 3  . Multiple Vitamins-Minerals (ICAPS AREDS FORMULA PO) Take by mouth.    Marland Kitchen omeprazole (PRILOSEC) 20 MG capsule Take 20 mg by mouth daily.    Marland Kitchen oxybutynin (DITROPAN) 5 MG tablet Take one daily HS. 30 tablet 12  . topiramate (TOPAMAX) 200 MG tablet TAKE 1 TABLET BY MOUTH TWICE A DAY 60 tablet 0  . VIMPAT 150 MG TABS TAKE 1 TABLET TWICE A DAY 60 tablet 5  . estrogen, conjugated,-medroxyprogesterone (PREMPRO) 0.45-1.5 MG per tablet Take 1 tablet by mouth daily. (Patient not  taking: Reported on 09/11/2014) 30 tablet 12   No facility-administered medications prior to visit.    PAST MEDICAL HISTORY: Past Medical History  Diagnosis Date  . Condyloma   . Epilepsy   . Depression   . Colon polyps 2014    PAST SURGICAL HISTORY: History reviewed. No pertinent past surgical history.  FAMILY HISTORY: Family History  Problem Relation  Age of Onset  . Hypertension Mother   . Diabetes Mother   . Heart disease Mother   . Hypertension Father   . Heart disease Father   . Diabetes Sister   . Hypertension Sister   . Heart disease Sister   . Seizures Neg Hx     SOCIAL HISTORY: History   Social History  . Marital Status: Legally Separated    Spouse Name: N/A  . Number of Children: 0  . Years of Education: 13   Occupational History  .      Disabled.   Social History Main Topics  . Smoking status: Never Smoker   . Smokeless tobacco: Never Used  . Alcohol Use: No  . Drug Use: No  . Sexual Activity:    Partners: Male    Birth Control/ Protection: Post-menopausal   Other Topics Concern  . Not on file   Social History Narrative   Patient lives at home alone single.   Patient is disabled.   Education some college education.   Right handed.   Caffeine four cups of tea daily.      PHYSICAL EXAM  Filed Vitals:   09/11/14 1027  BP: 110/70  Pulse: 80  Height: 5\' 4"  (1.626 m)  Weight: 175 lb (79.379 kg)   Body mass index is 30.02 kg/(m^2).  Generalized: Well developed, in no acute distress   Neurological examination  Mentation: Alert oriented to time, place, history taking. Follows all commands speech and language fluent Cranial nerve II-XII: Pupils were equal round reactive to light. Extraocular movements were full, visual field were full on confrontational test. Facial sensation and strength were normal. Uvula tongue midline. Head turning and shoulder shrug  were normal and symmetric. Motor: The motor testing reveals 5 over 5 strength of all 4 extremities. Good symmetric motor tone is noted throughout.  Sensory: Sensory testing is intact to soft touch on all 4 extremities. No evidence of extinction is noted.  Coordination: Cerebellar testing reveals good finger-nose-finger and heel-to-shin bilaterally.  Gait and station: Gait is normal. Tandem gait is normal. Romberg is negative. No drift is seen.    Reflexes: Deep tendon reflexes are symmetric and normal bilaterally.    DIAGNOSTIC DATA (LABS, IMAGING, TESTING) - I reviewed patient records, labs, notes, testing and imaging myself where available.  Lab Results  Component Value Date   WBC 6.5 03/06/2014   HGB 13.5 03/06/2014   HCT 42.2 03/06/2014   MCV 95 03/06/2014   PLT 345 03/06/2014      Component Value Date/Time   NA 140 03/06/2014 1040   K 3.9 03/06/2014 1040   CL 107 03/06/2014 1040   CO2 17* 03/06/2014 1040   GLUCOSE 102* 03/06/2014 1040   BUN 11 03/06/2014 1040   CREATININE 0.81 03/06/2014 1040   CALCIUM 9.1 03/06/2014 1040   PROT 6.8 03/06/2014 1040   AST 13 03/06/2014 1040   ALT 8 03/06/2014 1040   ALKPHOS 93 03/06/2014 1040   BILITOT <0.2 03/06/2014 1040   GFRNONAA 80 03/06/2014 1040   GFRAA 92 03/06/2014 1040      ASSESSMENT AND  PLAN 59 y.o. year old female  has a past medical history of Condyloma; Epilepsy; Depression; and Colon polyps (2014). here with:  1. Seizures  Patient states that she's had 2 seizures since the last visit. However she cannot recall the date of the seizures. She does know that the seizures occurred during a high stressful time for her. For now we will keep her medications the same. I will check blood work today. Patient advised that if she has any additional seizures she should let us know and at that time her medication will be increased. Patient verbalized understanding. She is not operating a motor vehicle. She will follow-up in 3 months or sooner if needed.     Ward Givens, MSN, NP-C 09/11/2014, 11:27 AM [facility] [address] [place], [place] [address] [phone]  Note: This document was prepared with digital dictation and possible smart phrase technology. Any transcriptional errors that result from this process are unintentional.

## 2014-09-12 ENCOUNTER — Telehealth: Payer: Self-pay

## 2014-09-12 LAB — CBC WITH DIFFERENTIAL/PLATELET
BASOS: 0 %
Basophils Absolute: 0 10*3/uL (ref 0.0–0.2)
EOS (ABSOLUTE): 0.1 10*3/uL (ref 0.0–0.4)
Eos: 1 %
HEMOGLOBIN: 13.6 g/dL (ref 11.1–15.9)
Hematocrit: 40.7 % (ref 34.0–46.6)
IMMATURE GRANS (ABS): 0 10*3/uL (ref 0.0–0.1)
Immature Granulocytes: 0 %
Lymphocytes Absolute: 2.5 10*3/uL (ref 0.7–3.1)
Lymphs: 37 %
MCH: 30.3 pg (ref 26.6–33.0)
MCHC: 33.4 g/dL (ref 31.5–35.7)
MCV: 91 fL (ref 79–97)
MONOS ABS: 0.8 10*3/uL (ref 0.1–0.9)
Monocytes: 11 %
NEUTROS PCT: 51 %
Neutrophils Absolute: 3.5 10*3/uL (ref 1.4–7.0)
Platelets: 323 10*3/uL (ref 150–379)
RBC: 4.49 x10E6/uL (ref 3.77–5.28)
RDW: 14 % (ref 12.3–15.4)
WBC: 6.9 10*3/uL (ref 3.4–10.8)

## 2014-09-12 LAB — COMPREHENSIVE METABOLIC PANEL
A/G RATIO: 2 (ref 1.1–2.5)
ALT: 19 IU/L (ref 0–32)
AST: 14 IU/L (ref 0–40)
Albumin: 4.3 g/dL (ref 3.5–5.5)
Alkaline Phosphatase: 114 IU/L (ref 39–117)
BUN/Creatinine Ratio: 18 (ref 9–23)
BUN: 13 mg/dL (ref 6–24)
Bilirubin Total: <0.2 mg/dL (ref 0.0–1.2)
CO2: 21 mmol/L (ref 18–29)
CREATININE: 0.72 mg/dL (ref 0.57–1.00)
Calcium: 9.2 mg/dL (ref 8.7–10.2)
Chloride: 105 mmol/L (ref 97–108)
GFR calc Af Amer: 106 mL/min/{1.73_m2} (ref >59)
GFR, EST NON AFRICAN AMERICAN: 92 mL/min/{1.73_m2} (ref >59)
GLOBULIN, TOTAL: 2.2 g/dL (ref 1.5–4.5)
GLUCOSE: 72 mg/dL (ref 65–99)
Potassium: 4.4 mmol/L (ref 3.5–5.2)
Sodium: 140 mmol/L (ref 134–144)
TOTAL PROTEIN: 6.5 g/dL (ref 6.0–8.5)

## 2014-09-12 NOTE — Telephone Encounter (Signed)
Called patient and left message labs were normal . Any questions or Concerns please give the office a call back.

## 2014-09-12 NOTE — Telephone Encounter (Signed)
-----   Message from Ward Givens, NP sent at 09/12/2014  7:35 AM EDT ----- Lab work is ok. Please call the patient.

## 2014-10-23 ENCOUNTER — Other Ambulatory Visit: Payer: Self-pay | Admitting: Neurology

## 2014-12-12 ENCOUNTER — Encounter: Payer: Self-pay | Admitting: Adult Health

## 2014-12-12 ENCOUNTER — Ambulatory Visit (INDEPENDENT_AMBULATORY_CARE_PROVIDER_SITE_OTHER): Payer: Medicare Other | Admitting: Adult Health

## 2014-12-12 ENCOUNTER — Telehealth: Payer: Self-pay | Admitting: Adult Health

## 2014-12-12 VITALS — BP 132/83 | HR 92 | Ht 64.0 in | Wt 174.0 lb

## 2014-12-12 DIAGNOSIS — R569 Unspecified convulsions: Secondary | ICD-10-CM | POA: Diagnosis not present

## 2014-12-12 NOTE — Telephone Encounter (Signed)
12/12/14-Called pt to try to schedule her 6 month f/u.  Pt advised that she would call back in a month or two.  I advised Dr. Jannifer Franklin' books up quickly for his follow-up appt's so to call as soon as she could.-SLB

## 2014-12-12 NOTE — Progress Notes (Signed)
PATIENT: Amber Bowman DOB: January 21, 1956  REASON FOR VISIT: follow up- seizures HISTORY FROM: patient  HISTORY OF PRESENT ILLNESS: Mrs. Amber Bowman is a 59 year old female with a history of seizures. She returns today for follow-up. She continues to take Topamax, felbamate and Vimpat. She is tolerating these medications well. She reports that since the last visit she is not had any additional seizure events. She is able to complete all ADLs independently. She states that she tries to exercise regularly. She states she normally walks 2 miles a day. She does not operate a motor vehicle. She denies any new medical issues. She returns today for an evaluation.  HISTORY 09/11/14: Mrs. Amber Bowman is a 5 -year-old female with a history of seizures. She returns today for follow-up. She is currently taking Topamax, Felbamate and Vimpat. She is tolerating these medications well. She states that her fianc told her that she had 2 seizures. However she cannot recall when the seizures happened. She states that the seizures consist of her staring off and she may start wandering around the house. She states that her fianc will try to talk to her but she does not answer. She states that this may go on for 5-10 minutes and then she returns to her baseline. She denies missing any medication. She does state that during this time she did have a death in her family. And in the past high stress has triggered her seizures. She states that since then she's not had a recurrence of her seizure. She states that in the past when she was on a higher dose of Vimpat that caused her to feel dizzy and fall. She states that she tolerates her current dose well. Denies any trouble with her gait. Denies any falls. She does not operate a motor vehicle. She is able to complete all ADLs independently. She states that she typically does not cook meals. She returns today for an evaluation. REVIEW OF SYSTEMS: Out of a complete 14 system review of  symptoms, the patient complains only of the following symptoms, and all other reviewed systems are negative.  Double vision, constipation, frequency of urination  ALLERGIES: No Known Allergies  HOME MEDICATIONS: Outpatient Prescriptions Prior to Visit  Medication Sig Dispense Refill  . aspirin 81 MG tablet Take 81 mg by mouth daily.      . felbamate (FELBATOL) 600 MG tablet TAKE 2 TABLETS BY MOUTH 4 TIMES A DAY 240 tablet 6  . Multiple Vitamins-Minerals (ICAPS AREDS FORMULA PO) Take by mouth.    Marland Kitchen omeprazole (PRILOSEC) 20 MG capsule Take 20 mg by mouth daily.    Marland Kitchen oxybutynin (DITROPAN) 5 MG tablet Take one daily HS. 30 tablet 12  . topiramate (TOPAMAX) 200 MG tablet TAKE 1 TABLET BY MOUTH TWICE A DAY 60 tablet 0  . VIMPAT 150 MG TABS TAKE 1 TABLET TWICE A DAY 60 tablet 5   No facility-administered medications prior to visit.    PAST MEDICAL HISTORY: Past Medical History  Diagnosis Date  . Condyloma   . Epilepsy ([place])   . Depression   . Colon polyps 2014    PAST SURGICAL HISTORY: History reviewed. No pertinent past surgical history.  FAMILY HISTORY: Family History  Problem Relation Age of Onset  . Hypertension Mother   . Diabetes Mother   . Heart disease Mother   . Hypertension Father   . Heart disease Father   . Diabetes Sister   . Hypertension Sister   . Heart disease Sister   .  Seizures Neg Hx     SOCIAL HISTORY: Social History   Social History  . Marital Status: Legally Separated    Spouse Name: N/A  . Number of Children: 0  . Years of Education: 13   Occupational History  .      Disabled.   Social History Main Topics  . Smoking status: Never Smoker   . Smokeless tobacco: Never Used  . Alcohol Use: No  . Drug Use: No  . Sexual Activity:    Partners: Male    Birth Control/ Protection: Post-menopausal   Other Topics Concern  . Not on file   Social History Narrative   Patient lives at home alone single.   Patient is disabled.   Education some  college education.   Right handed.   Caffeine four cups of tea daily.      PHYSICAL EXAM  Filed Vitals:   12/12/14 1038  BP: 132/83  Pulse: 92  Height: 5\' 4"  (1.626 m)  Weight: 174 lb (78.926 kg)   Body mass index is 29.85 kg/(m^2).  Generalized: Well developed, in no acute distress   Neurological examination  Mentation: Alert oriented to time, place, history taking. Follows all commands speech and language fluent Cranial nerve II-XII: Pupils were equal round reactive to light. Extraocular movements were full, visual field were full on confrontational test. Facial sensation and strength were normal. Uvula tongue midline. Head turning and shoulder shrug  were normal and symmetric. Motor: The motor testing reveals 5 over 5 strength of all 4 extremities. Good symmetric motor tone is noted throughout.  Sensory: Sensory testing is intact to soft touch on all 4 extremities. No evidence of extinction is noted.  Coordination: Cerebellar testing reveals good finger-nose-finger and heel-to-shin bilaterally.  Gait and station: Gait is normal. Tandem gait is normal. Romberg is negative. No drift is seen.  Reflexes: Deep tendon reflexes are symmetric and normal bilaterally.   DIAGNOSTIC DATA (LABS, IMAGING, TESTING) - I reviewed patient records, labs, notes, testing and imaging myself where available.  Lab Results  Component Value Date   WBC 6.9 09/11/2014   HGB 13.5 03/06/2014   HCT 40.7 09/11/2014   MCV 95 03/06/2014   PLT 345 03/06/2014      Component Value Date/Time   NA 140 09/11/2014 1150   K 4.4 09/11/2014 1150   CL 105 09/11/2014 1150   CO2 21 09/11/2014 1150   GLUCOSE 72 09/11/2014 1150   BUN 13 09/11/2014 1150   CREATININE 0.72 09/11/2014 1150   CALCIUM 9.2 09/11/2014 1150   PROT 6.5 09/11/2014 1150   AST 14 09/11/2014 1150   ALT 19 09/11/2014 1150   ALKPHOS 114 09/11/2014 1150   BILITOT <0.2 09/11/2014 1150   BILITOT <0.2 03/06/2014 1040   GFRNONAA 92 09/11/2014  1150   GFRAA 106 09/11/2014 1150   ASSESSMENT AND PLAN 59 y.o. year old female  has a past medical history of Condyloma; Epilepsy ([place]); Depression; and Colon polyps (2014). here with:  1. Seizures  Overall the patient is doing well. She will continue on Topamax, felbamate and Vimpat. She had blood work at the last visit that was unremarkable. Patient advised that if she has any additional seizure events she should let us know. She will follow-up in 6 months or sooner if needed.   Ward Givens, MSN, NP-C 12/12/2014, 10:57 AM [facility] [address] [place], [place] [address] [phone]

## 2014-12-12 NOTE — Patient Instructions (Signed)
Continue Felbamate, topamax and Vimpat If you have any seizure events please let us know.

## 2014-12-12 NOTE — Progress Notes (Signed)
I have read the note, and I agree with the clinical assessment and plan.  Mohmmad Saleeby KEITH   

## 2014-12-13 ENCOUNTER — Ambulatory Visit: Payer: Medicare Other | Admitting: Adult Health

## 2014-12-19 ENCOUNTER — Telehealth: Payer: Self-pay | Admitting: Adult Health

## 2014-12-19 NOTE — Telephone Encounter (Addendum)
Patient states she went to PCP on 12/11/14 and was told to start taking an allergy med regularly. Patient started taking Zyrtec and has never taken before, went to [facility] on 12/13/14, had a sz, was charged with shop lifting b/c items were found in her purse. Pt thinks the allergy med has triggered sz(s). Pt requesting medical info to prove she has hx of sz(s), staring spells, and not being able to recall what happens during or after sz. Per boyfriend pt had a sz on yesterday and has witnessed pt having sz(s) in the past where she will pick up things and put them in different places as if she is putting them up. Placed last OV note at front desk for pt/boyfriend Billy to p/u. Sent to Dr. Jannifer Franklin RN to see if pt can be worked in for a earlier visit.

## 2014-12-19 NOTE — Telephone Encounter (Signed)
Returned patient's call. No answer. Left vmail.  

## 2014-12-19 NOTE — Telephone Encounter (Signed)
I called the patient. She is scheduled for 10/18. I offered a sooner appointment, but she requested a 12 PM appointment vs. 8 AM.

## 2014-12-19 NOTE — Telephone Encounter (Signed)
Patient is returning a call. °

## 2014-12-19 NOTE — Telephone Encounter (Signed)
I called the patient, left a message. The patient has intractable epilepsy. Antihistamine medications can lower the seizure threshold, make seizures more like to occur. The patient does have partial complex seizures, I suppose it is possible that she may have some automatic behaviors during the seizures including putting things into her purse. If the patient needs a letter, I will provide her with 1.

## 2014-12-19 NOTE — Telephone Encounter (Signed)
Patient called requesting to speak with [facility]. She has something going on that she wants to talk to her about but doesn't wish to disclose what it is. Please call [phone].

## 2014-12-25 ENCOUNTER — Ambulatory Visit (INDEPENDENT_AMBULATORY_CARE_PROVIDER_SITE_OTHER): Payer: Medicare Other | Admitting: Neurology

## 2014-12-25 ENCOUNTER — Encounter: Payer: Self-pay | Admitting: Neurology

## 2014-12-25 VITALS — BP 122/75 | HR 80 | Ht 64.0 in | Wt 176.0 lb

## 2014-12-25 DIAGNOSIS — Z5181 Encounter for therapeutic drug level monitoring: Secondary | ICD-10-CM | POA: Diagnosis not present

## 2014-12-25 DIAGNOSIS — G40219 Localization-related (focal) (partial) symptomatic epilepsy and epileptic syndromes with complex partial seizures, intractable, without status epilepticus: Secondary | ICD-10-CM

## 2014-12-25 NOTE — Progress Notes (Signed)
Reason for visit: Seizures  Amber Bowman is an 59 y.o. female  History of present illness:  Amber Bowman is a 59 year old right-handed white female with a history of partial complex type seizure events. The patient usually has 1-3 seizures a month. The patient has recently had a seizure around 12/10/2014 while in [place]. The patient apparently had automatic behavior during the seizure, and she was detained for shoplifting. The patient has required an attorney for this issue. The patient returns this office for an evaluation. The patient is on 3 different seizure medications including Topamax, Felbatol, and Vimpat. Her seizures have never been fully controlled. She denies any new medical issues that have come up since last seen. She does not operate a motor vehicle.  Past Medical History  Diagnosis Date  . Condyloma   . Epilepsy ([place])   . Depression   . Colon polyps 2014    History reviewed. No pertinent past surgical history.  Family History  Problem Relation Age of Onset  . Hypertension Mother   . Diabetes Mother   . Heart disease Mother   . Hypertension Father   . Heart disease Father   . Diabetes Sister   . Hypertension Sister   . Heart disease Sister   . Seizures Neg Hx     Social history:  reports that she has never smoked. She has never used smokeless tobacco. She reports that she does not drink alcohol or use illicit drugs.   No Known Allergies  Medications:  Prior to Admission medications   Medication Sig Start Date End Date Taking? Authorizing Provider  aspirin 81 MG tablet Take 81 mg by mouth daily.     Yes Historical Provider, MD  felbamate (FELBATOL) 600 MG tablet TAKE 2 TABLETS BY MOUTH 4 TIMES A DAY 10/23/14  Yes Kathrynn Ducking, MD  Multiple Vitamins-Minerals (ICAPS AREDS FORMULA PO) Take by mouth.   Yes Historical Provider, MD  omeprazole (PRILOSEC) 20 MG capsule Take 20 mg by mouth daily.   Yes Historical Provider, MD  oxybutynin (DITROPAN) 5 MG  tablet Take one daily HS. 04/30/14  Yes Huel Cote, NP  topiramate (TOPAMAX) 200 MG tablet TAKE 1 TABLET BY MOUTH TWICE A DAY 04/05/13  Yes Kathrynn Ducking, MD  VIMPAT 150 MG TABS TAKE 1 TABLET TWICE A DAY 08/28/14  Yes Kathrynn Ducking, MD    ROS:  Out of a complete 14 system review of symptoms, the patient complains only of the following symptoms, and all other reviewed systems are negative.  Seizures Anxiety  Blood pressure 122/75, pulse 80, height 5\' 4"  (1.626 m), weight 176 lb (79.833 kg), last menstrual period 02/11/2008.  Physical Exam  General: The patient is alert and cooperative at the time of the examination. The patient is moderately obese.  Skin: No significant peripheral edema is noted.   Neurologic Exam  Mental status: The patient is alert and oriented x 3 at the time of the examination. The patient has apparent normal recent and remote memory, with an apparently normal attention span and concentration ability.   Cranial nerves: Facial symmetry is present. Speech is normal, no aphasia or dysarthria is noted. Extraocular movements are full. Visual fields are full.  Motor: The patient has good strength in all 4 extremities.  Sensory examination: Soft touch sensation is symmetric on the face, arms, and legs.  Coordination: The patient has good finger-nose-finger and heel-to-shin bilaterally.  Gait and station: The patient has a normal gait. Tandem  gait is normal. Romberg is negative. No drift is seen.  Reflexes: Deep tendon reflexes are symmetric.   Assessment/Plan:  1. Partial complex seizures, intractable  The patient continues to have ongoing seizure-type events. She will have blood work done today, and she will follow-up in 3-4 months. I have suggested an evaluation with video EEG monitoring, the patient is not sure that she wishes to pursue this as she does not believe that she would want surgery. A vagal nerve stimulator placement may be another option.  The patient will contact me if they decide to pursue these issues.   Amber Alexanders MD 12/25/2014 8:05 PM  [facility] [address] [place], [place] [address]  Phone [phone] Fax [phone]

## 2014-12-26 ENCOUNTER — Telehealth: Payer: Self-pay

## 2014-12-26 LAB — COMPREHENSIVE METABOLIC PANEL
A/G RATIO: 1.5 (ref 1.1–2.5)
ALT: 16 IU/L (ref 0–32)
AST: 15 IU/L (ref 0–40)
Albumin: 4.1 g/dL (ref 3.5–5.5)
Alkaline Phosphatase: 162 IU/L — ABNORMAL HIGH (ref 39–117)
BUN/Creatinine Ratio: 15 (ref 9–23)
BUN: 10 mg/dL (ref 6–24)
Bilirubin Total: <0.2 mg/dL (ref 0.0–1.2)
CALCIUM: 9.5 mg/dL (ref 8.7–10.2)
CO2: 21 mmol/L (ref 18–29)
Chloride: 104 mmol/L (ref 97–106)
Creatinine, Ser: 0.66 mg/dL (ref 0.57–1.00)
GFR calc non Af Amer: 97 mL/min/{1.73_m2} (ref >59)
GFR, EST AFRICAN AMERICAN: 112 mL/min/{1.73_m2} (ref >59)
GLOBULIN, TOTAL: 2.7 g/dL (ref 1.5–4.5)
Glucose: 91 mg/dL (ref 65–99)
POTASSIUM: 4.1 mmol/L (ref 3.5–5.2)
SODIUM: 142 mmol/L (ref 136–144)
TOTAL PROTEIN: 6.8 g/dL (ref 6.0–8.5)

## 2014-12-26 LAB — CBC WITH DIFFERENTIAL/PLATELET
BASOS: 1 %
Basophils Absolute: 0 10*3/uL (ref 0.0–0.2)
EOS (ABSOLUTE): 0.1 10*3/uL (ref 0.0–0.4)
Eos: 1 %
Hematocrit: 41.2 % (ref 34.0–46.6)
Hemoglobin: 13.8 g/dL (ref 11.1–15.9)
IMMATURE GRANS (ABS): 0 10*3/uL (ref 0.0–0.1)
Immature Granulocytes: 0 %
Lymphocytes Absolute: 2.3 10*3/uL (ref 0.7–3.1)
Lymphs: 35 %
MCH: 30.5 pg (ref 26.6–33.0)
MCHC: 33.5 g/dL (ref 31.5–35.7)
MCV: 91 fL (ref 79–97)
MONOS ABS: 0.8 10*3/uL (ref 0.1–0.9)
Monocytes: 13 %
NEUTROS ABS: 3.4 10*3/uL (ref 1.4–7.0)
Neutrophils: 50 %
PLATELETS: 308 10*3/uL (ref 150–379)
RBC: 4.52 x10E6/uL (ref 3.77–5.28)
RDW: 14 % (ref 12.3–15.4)
WBC: 6.6 10*3/uL (ref 3.4–10.8)

## 2014-12-26 NOTE — Telephone Encounter (Signed)
I called the patient and left voicemail relaying results.

## 2014-12-26 NOTE — Telephone Encounter (Signed)
-----  Message from Kathrynn Ducking, MD sent at 12/26/2014  7:35 AM EDT -----  The blood work results are unremarkable, with exception of minimal elevation of Alk Phos, not clinically significant. Will follow. Please call the patient. ----- Message -----    From: [facility] Results In Interface    Sent: 12/26/2014   5:40 AM      To: Kathrynn Ducking, MD

## 2015-02-05 ENCOUNTER — Other Ambulatory Visit: Payer: Self-pay

## 2015-02-05 ENCOUNTER — Telehealth: Payer: Self-pay | Admitting: Neurology

## 2015-02-05 MED ORDER — TOPIRAMATE 200 MG PO TABS: 200.0000 mg | ORAL_TABLET | Freq: Two times a day (BID) | ORAL | Status: DC

## 2015-02-05 MED ORDER — LACOSAMIDE 150 MG PO TABS: 1.0000 | ORAL_TABLET | Freq: Two times a day (BID) | ORAL | Status: DC

## 2015-02-05 NOTE — Telephone Encounter (Signed)
Patient called to request refill of topiramate (TOPAMAX) 200 MG tablet

## 2015-02-05 NOTE — Telephone Encounter (Signed)
Rx has been sent.  Receipt confirmed by pharmacy.   

## 2015-02-19 ENCOUNTER — Ambulatory Visit (INDEPENDENT_AMBULATORY_CARE_PROVIDER_SITE_OTHER): Payer: Medicare Other | Admitting: Women's Health

## 2015-02-19 ENCOUNTER — Encounter: Payer: Self-pay | Admitting: Women's Health

## 2015-02-19 ENCOUNTER — Other Ambulatory Visit (HOSPITAL_COMMUNITY)
Admission: RE | Admit: 2015-02-19 | Discharge: 2015-02-19 | Disposition: A | Discharge status: Home / Self Care | Payer: Medicare Other | Source: Ambulatory Visit | Attending: Gynecology | Admitting: Gynecology

## 2015-02-19 VITALS — BP 126/80 | Ht 64.0 in | Wt 174.0 lb

## 2015-02-19 DIAGNOSIS — Z124 Encounter for screening for malignant neoplasm of cervix: Secondary | ICD-10-CM

## 2015-02-19 DIAGNOSIS — N898 Other specified noninflammatory disorders of vagina: Secondary | ICD-10-CM

## 2015-02-19 DIAGNOSIS — R350 Frequency of micturition: Secondary | ICD-10-CM | POA: Diagnosis not present

## 2015-02-19 DIAGNOSIS — L298 Other pruritus: Secondary | ICD-10-CM

## 2015-02-19 DIAGNOSIS — Z79890 Hormone replacement therapy (postmenopausal): Secondary | ICD-10-CM | POA: Diagnosis not present

## 2015-02-19 LAB — WET PREP FOR TRICH, YEAST, CLUE
CLUE CELLS WET PREP: NONE SEEN
Trich, Wet Prep: NONE SEEN
Yeast Wet Prep HPF POC: NONE SEEN

## 2015-02-19 MED ORDER — ESTRADIOL 0.5 MG PO TABS: 0.5000 mg | ORAL_TABLET | Freq: Every day | ORAL | Status: DC

## 2015-02-19 MED ORDER — NORETHINDRONE ACETATE 5 MG PO TABS: 2.5000 mg | ORAL_TABLET | Freq: Every day | ORAL | Status: DC

## 2015-02-19 NOTE — Patient Instructions (Addendum)
After you "cool down" then break the estradiol in half and continue the progesteron/ aygestin   Hormone Therapy At menopause, your body begins making less estrogen and progesterone hormones. This causes the body to stop having menstrual periods. This is because estrogen and progesterone hormones control your periods and menstrual cycle. A lack of estrogen may cause symptoms such as:  Hot flushes (or hot flashes).  Vaginal dryness.  Dry skin.  Loss of sex drive.  Risk of bone loss (osteoporosis). When this happens, you may choose to take hormone therapy to get back the estrogen lost during menopause. When the hormone estrogen is given alone, it is usually referred to as ET (Estrogen Therapy). When the hormone progestin is combined with estrogen, it is generally called HT (Hormone Therapy). This was formerly known as hormone replacement therapy (HRT). Your caregiver can help you make a decision on what will be best for you. The decision to use HT seems to change often as new studies are done. Many studies do not agree on the benefits of hormone replacement therapy. LIKELY BENEFITS OF HT INCLUDE PROTECTION FROM:  Hot Flushes (also called hot flashes) - A hot flush is a sudden feeling of heat that spreads over the face and body. The skin may redden like a blush. It is connected with sweats and sleep disturbance. Women going through menopause may have hot flushes a few times a month or several times per day depending on the woman.  Osteoporosis (bone loss) - Estrogen helps guard against bone loss. After menopause, a woman's bones slowly lose calcium and become weak and brittle. As a result, bones are more likely to break. The hip, wrist, and spine are affected most often. Hormone therapy can help slow bone loss after menopause. Weight bearing exercise and taking calcium with vitamin D also can help prevent bone loss. There are also medications that your caregiver can prescribe that can help prevent  osteoporosis.  Vaginal dryness - Loss of estrogen causes changes in the vagina. Its lining may become thin and dry. These changes can cause pain and bleeding during sexual intercourse. Dryness can also lead to infections. This can cause burning and itching. (Vaginal estrogen treatment can help relieve pain, itching, and dryness.)  Urinary tract infections are more common after menopause because of lack of estrogen. Some women also develop urinary incontinence because of low estrogen levels in the vagina and bladder.  Possible other benefits of estrogen include a positive effect on mood and short-term memory in women. RISKS AND COMPLICATIONS  Using estrogen alone without progesterone causes the lining of the uterus to grow. This increases the risk of lining of the uterus (endometrial) cancer. Your caregiver should give another hormone called progestin if you have a uterus.  Women who take combined (estrogen and progestin) HT appear to have an increased risk of breast cancer. The risk appears to be small, but increases throughout the time that HT is taken.  Combined therapy also makes the breast tissue slightly denser which makes it harder to read mammograms (breast X-rays).  Combined, estrogen and progesterone therapy can be taken together every day, in which case there may be spotting of blood. HT therapy can be taken cyclically in which case you will have menstrual periods. Cyclically means HT is taken for a set amount of days, then not taken, then this process is repeated.  HT may increase the risk of stroke, heart attack, breast cancer and forming blood clots in your leg.  Transdermal estrogen (estrogen  that is absorbed through the skin with a patch or a cream) may have better results with:  Cholesterol.  Blood pressure.  Blood clots. Having the following conditions may indicate you should not have HT:  Endometrial cancer.  Liver disease.  Breast cancer.  Heart  disease.  History of blood clots.  Stroke. TREATMENT   If you choose to take HT and have a uterus, usually estrogen and progestin are prescribed.  Your caregiver will help you decide the best way to take the medications.  Possible ways to take estrogen include:  Pills.  Patches.  Gels.  Sprays.  Vaginal estrogen cream, rings and tablets.  It is best to take the lowest dose possible that will help your symptoms and take them for the shortest period of time that you can.  Hormone therapy can help relieve some of the problems (symptoms) that affect women at menopause. Before making a decision about HT, talk to your caregiver about what is best for you. Be well informed and comfortable with your decisions. HOME CARE INSTRUCTIONS   Follow your caregivers advice when taking the medications.  A Pap test is done to screen for cervical cancer.  The first Pap test should be done at age 60.  Between ages 60 and 66, Pap tests are repeated every 2 years.  Beginning at age 60, you are advised to have a Pap test every 3 years every 3 years as long as the past 3 Pap tests have been normal.  Some women have medical problems that increase the chance of getting cervical cancer. Talk to your caregiver about these problems. It is especially important to talk to your caregiver if a new problem develops soon after your last Pap test. In these cases, your caregiver may recommend more frequent screening and Pap tests.  The above recommendations are the same for women who have or have not gotten the vaccine for HPV (human papillomavirus).  If you had a hysterectomy for a problem that was not a cancer or a condition that could lead to cancer, then you no longer need Pap tests. However, even if you no longer need a Pap test, a regular exam is a good idea to make sure no other problems are starting.  If you are between ages 16 and 25, and you have had normal Pap tests going back 10 years, you no longer need Pap  tests. However, even if you no longer need a Pap test, a regular exam is a good idea to make sure no other problems are starting.  If you have had past treatment for cervical cancer or a condition that could lead to cancer, you need Pap tests and screening for cancer for at least 20 years after your treatment.  If Pap tests have been discontinued, risk factors (such as a new sexual partner)need to be re-assessed to determine if screening should be resumed.  Some women may need screenings more often if they are at high risk for cervical cancer.  Get mammograms done as per the advice of your caregiver. SEEK IMMEDIATE MEDICAL CARE IF:  You develop abnormal vaginal bleeding.  You have pain or swelling in your legs, shortness of breath, or chest pain.  You develop dizziness or headaches.  You have lumps or changes in your breasts or armpits.  You have slurred speech.  You develop weakness or numbness of your arms or legs.  You have pain, burning, or bleeding when urinating.  You develop abdominal pain.   This information is not  intended to replace advice given to you by your health care provider. Make sure you discuss any questions you have with your health care provider.   Document Released: 11/22/2002 Document Revised: 07/10/2014 Document Reviewed: 08/27/2014 Elsevier Interactive Patient Education [employer]. Menopause is a normal process in which your reproductive ability comes to an end. This process happens gradually over a span of months to years, usually between the ages of 35 and 38. Menopause is complete when you have missed 12 consecutive menstrual periods. It is important to talk with your health care provider about some of the most common conditions that affect postmenopausal women, such as heart disease, cancer, and bone loss (osteoporosis). Adopting a healthy lifestyle and getting preventive care can help to promote your health and wellness. Those actions can also  lower your chances of developing some of these common conditions. WHAT SHOULD I KNOW ABOUT MENOPAUSE? During menopause, you may experience a number of symptoms, such as:  Moderate-to-severe hot flashes.  Night sweats.  Decrease in sex drive.  Mood swings.  Headaches.  Tiredness.  Irritability.  Memory problems.  Insomnia. Choosing to treat or not to treat menopausal changes is an individual decision that you make with your health care provider. WHAT SHOULD I KNOW ABOUT HORMONE REPLACEMENT THERAPY AND SUPPLEMENTS? Hormone therapy products are effective for treating symptoms that are associated with menopause, such as hot flashes and night sweats. Hormone replacement carries certain risks, especially as you become older. If you are thinking about using estrogen or estrogen with progestin treatments, discuss the benefits and risks with your health care provider. WHAT SHOULD I KNOW ABOUT HEART DISEASE AND STROKE? Heart disease, heart attack, and stroke become more likely as you age. This may be due, in part, to the hormonal changes that your body experiences during menopause. These can affect how your body processes dietary fats, triglycerides, and cholesterol. Heart attack and stroke are both medical emergencies. There are many things that you can do to help prevent heart disease and stroke:  Have your blood pressure checked at least every 1-2 years. High blood pressure causes heart disease and increases the risk of stroke.  If you are 64-31 years old, ask your health care provider if you should take aspirin to prevent a heart attack or a stroke.  Do not use any tobacco products, including cigarettes, chewing tobacco, or electronic cigarettes. If you need help quitting, ask your health care provider.  It is important to eat a healthy diet and maintain a healthy weight.  Be sure to include plenty of vegetables, fruits, low-fat dairy products, and lean protein.  Avoid eating foods  that are high in solid fats, added sugars, or salt (sodium).  Get regular exercise. This is one of the most important things that you can do for your health.  Try to exercise for at least 150 minutes each week. The type of exercise that you do should increase your heart rate and make you sweat. This is known as moderate-intensity exercise.  Try to do strengthening exercises at least twice each week. Do these in addition to the moderate-intensity exercise.  Know your numbers.Ask your health care provider to check your cholesterol and your blood glucose. Continue to have your blood tested as directed by your health care provider. WHAT SHOULD I KNOW ABOUT CANCER SCREENING? There are several types of cancer. Take the following steps to reduce your risk and to catch any cancer development as early as possible. Breast Cancer  Practice breast self-awareness.  This means understanding how your breasts normally appear and feel.  It also means doing regular breast self-exams. Let your health care provider know about any changes, no matter how small.  If you are 52 or older, have a clinician do a breast exam (clinical breast exam or CBE) every year. Depending on your age, family history, and medical history, it may be recommended that you also have a yearly breast X-ray (mammogram).  If you have a family history of breast cancer, talk with your health care provider about genetic screening.  If you are at high risk for breast cancer, talk with your health care provider about having an MRI and a mammogram every year.  Breast cancer (BRCA) gene test is recommended for women who have family members with BRCA-related cancers. Results of the assessment will determine the need for genetic counseling and BRCA1 and for BRCA2 testing. BRCA-related cancers include these types:  Breast. This occurs in males or females.  Ovarian.  Tubal. This may also be called fallopian tube cancer.  Cancer of the  abdominal or pelvic lining (peritoneal cancer).  Prostate.  Pancreatic. Cervical, Uterine, and Ovarian Cancer Your health care provider may recommend that you be screened regularly for cancer of the pelvic organs. These include your ovaries, uterus, and vagina. This screening involves a pelvic exam, which includes checking for microscopic changes to the surface of your cervix (Pap test).  For women ages 21-65, health care providers may recommend a pelvic exam and a Pap test every three years. For women ages 50-65, they may recommend the Pap test and pelvic exam, combined with testing for human papilloma virus (HPV), every five years. Some types of HPV increase your risk of cervical cancer. Testing for HPV may also be done on women of any age who have unclear Pap test results.  Other health care providers may not recommend any screening for nonpregnant women who are considered low risk for pelvic cancer and have no symptoms. Ask your health care provider if a screening pelvic exam is right for you.  If you have had past treatment for cervical cancer or a condition that could lead to cancer, you need Pap tests and screening for cancer for at least 20 years after your treatment. If Pap tests have been discontinued for you, your risk factors (such as having a new sexual partner) need to be reassessed to determine if you should start having screenings again. Some women have medical problems that increase the chance of getting cervical cancer. In these cases, your health care provider may recommend that you have screening and Pap tests more often.  If you have a family history of uterine cancer or ovarian cancer, talk with your health care provider about genetic screening.  If you have vaginal bleeding after reaching menopause, tell your health care provider.  There are currently no reliable tests available to screen for ovarian cancer. Lung Cancer Lung cancer screening is recommended for adults 29-49  years old who are at high risk for lung cancer because of a history of smoking. A yearly low-dose CT scan of the lungs is recommended if you:  Currently smoke.  Have a history of at least 30 pack-years of smoking and you currently smoke or have quit within the past 15 years. A pack-year is smoking an average of one pack of cigarettes per day for one year. Yearly screening should:  Continue until it has been 15 years since you quit.  Stop if you develop a health problem  that would prevent you from having lung cancer treatment. Colorectal Cancer  This type of cancer can be detected and can often be prevented.  Routine colorectal cancer screening usually begins at age 72 and continues through age 67.  If you have risk factors for colon cancer, your health care provider may recommend that you be screened at an earlier age.  If you have a family history of colorectal cancer, talk with your health care provider about genetic screening.  Your health care provider may also recommend using home test kits to check for hidden blood in your stool.  A small camera at the end of a tube can be used to examine your colon directly (sigmoidoscopy or colonoscopy). This is done to check for the earliest forms of colorectal cancer.  Direct examination of the colon should be repeated every 5-10 years until age 66. However, if early forms of precancerous polyps or small growths are found or if you have a family history or genetic risk for colorectal cancer, you may need to be screened more often. Skin Cancer  Check your skin from head to toe regularly.  Monitor any moles. Be sure to tell your health care provider:  About any new moles or changes in moles, especially if there is a change in a mole's shape or color.  If you have a mole that is larger than the size of a pencil eraser.  If any of your family members has a history of skin cancer, especially at a young age, talk with your health care provider  about genetic screening.  Always use sunscreen. Apply sunscreen liberally and repeatedly throughout the day.  Whenever you are outside, protect yourself by wearing long sleeves, pants, a wide-brimmed hat, and sunglasses. WHAT SHOULD I KNOW ABOUT OSTEOPOROSIS? Osteoporosis is a condition in which bone destruction happens more quickly than new bone creation. After menopause, you may be at an increased risk for osteoporosis. To help prevent osteoporosis or the bone fractures that can happen because of osteoporosis, the following is recommended:  If you are 1-78 years old, get at least 1,000 mg of calcium and at least 600 mg of vitamin D per day.  If you are older than age 43 but younger than age 64, get at least 1,200 mg of calcium and at least 600 mg of vitamin D per day.  If you are older than age 32, get at least 1,200 mg of calcium and at least 800 mg of vitamin D per day. Smoking and excessive alcohol intake increase the risk of osteoporosis. Eat foods that are rich in calcium and vitamin D, and do weight-bearing exercises several times each week as directed by your health care provider. WHAT SHOULD I KNOW ABOUT HOW MENOPAUSE AFFECTS [place]? Depression may occur at any age, but it is more common as you become older. Common symptoms of depression include:  Low or sad mood.  Changes in sleep patterns.  Changes in appetite or eating patterns.  Feeling an overall lack of motivation or enjoyment of activities that you previously enjoyed.  Frequent crying spells. Talk with your health care provider if you think that you are experiencing depression. WHAT SHOULD I KNOW ABOUT IMMUNIZATIONS? It is important that you get and maintain your immunizations. These include:  Tetanus, diphtheria, and pertussis (Tdap) booster vaccine.  Influenza every year before the flu season begins.  Pneumonia vaccine.  Shingles vaccine. Your health care provider may also recommend other  immunizations.   This information is  not intended to replace advice given to you by your health care provider. Make sure you discuss any questions you have with your health care provider.   Document Released: 04/17/2005 Document Revised: 03/16/2014 Document Reviewed: 10/26/2013 Elsevier Interactive Patient Education 2016 [employer] Overactive Bladder, Adult Overactive bladder is a group of urinary symptoms. With overactive bladder, you may suddenly feel the need to pass urine (urinate) right away. After feeling this sudden urge, you might also leak urine if you cannot get to the bathroom fast enough (urinary incontinence). These symptoms might interfere with your daily work or social activities. Overactive bladder symptoms may also wake you up at night. Overactive bladder affects the nerve signals between your bladder and your brain. Your bladder may get the signal to empty before it is full. Very sensitive muscles can also make your bladder squeeze too soon. CAUSES Many things can cause an overactive bladder. Possible causes include:  Urinary tract infection.  Infection of nearby tissues, such as the prostate.  Prostate enlargement.  Being pregnant with twins or more (multiples).  Surgery on the uterus or urethra.  Bladder stones, inflammation, or tumors.  Drinking too much caffeine or alcohol.  Certain medicines, especially those that you take to help your body get rid of extra fluid (diuretics) by increasing urine production.  Muscle or nerve weakness, especially from:  A spinal cord injury.  Stroke.  Multiple sclerosis.  Parkinson disease.  Diabetes. This can cause a high urine volume that fills the bladder so quickly that the normal urge to urinate is triggered very strongly.  Constipation. A buildup of too much stool can put pressure on your bladder. RISK FACTORS You may be at greater risk for overactive bladder if you:  Are an older adult.  Smoke.  Are going  through menopause.  Have prostate problems.  Have a neurological disease, such as stroke, dementia, Parkinson disease, or multiple sclerosis (MS).  Eat or drink things that irritate the bladder. These include alcohol, spicy food, and caffeine.  Are overweight or obese. SIGNS AND SYMPTOMS  The signs and symptoms of an overactive bladder include:  Sudden, strong urges to urinate.  Leaking urine.  Urinating eight or more times per day.  Waking up to urinate two or more times per night. DIAGNOSIS Your health care provider may suspect overactive bladder based on your symptoms. The health care provider will do a physical exam and take your medical history. Blood or urine tests may also be done. For example, you might need to have a bladder function test to check how well you can hold your urine. You might also need to see a health care provider who specializes in the urinary tract (urologist). TREATMENT Treatment for overactive bladder depends on the cause of your condition and whether it is mild or severe. Certain treatments can be done in your health care provider's office or clinic. You can also make lifestyle changes at home. Options include: Behavioral Treatments  Biofeedback. A specialist uses sensors to help you become aware of your body's signals.  Keeping a daily log of when you need to urinate and what happens after the urge. This may help you manage your condition.  Bladder training. This helps you learn to control the urge to urinate by following a schedule that directs you to urinate at regular intervals (timed voiding). At first, you might have to wait a few minutes after feeling the urge. In time, you should be able to schedule bathroom visits an hour or more  apart.  Kegel exercises. These are exercises to strengthen the pelvic floor muscles, which support the bladder. Toning these muscles can help you control urination, even if your bladder muscles are overactive. A  specialist will teach you how to do these exercises correctly. They require daily practice.  Weight loss. If you are obese or overweight, losing weight might relieve your symptoms of overactive bladder. Talk to your health care provider about losing weight and whether there is a specific program or method that would work best for you.  Diet change. This might help if constipation is making your overactive bladder worse. Your health care provider or a dietitian can explain ways to change what you eat to ease constipation. You might also need to consume less alcohol and caffeine or drink other fluids at different times of the day.  Stopping smoking.  Wearing pads to absorb leakage while you wait for other treatments to take effect. Physical Treatments  Electrical stimulation. Electrodes send gentle pulses of electricity to strengthen the nerves or muscles that help to control the bladder. Sometimes, the electrodes are placed outside of the body. In other cases, they might be placed inside the body (implanted). This treatment can take several months to have an effect.  Supportive devices. Women may need a plastic device that fits into the vagina and supports the bladder (pessary). Medicines Several medicines can help treat overactive bladder and are usually used along with other treatments. Some are injected into the muscles involved in urination. Others come in pill form. Your health care provider may prescribe:  Antispasmodics. These medicines block the signals that the nerves send to the bladder. This keeps the bladder from releasing urine at the wrong time.  Tricyclic antidepressants. These types of antidepressants also relax bladder muscles. Surgery  You may have a device implanted to help manage the nerve signals that indicate when you need to urinate.  You may have surgery to implant electrodes for electrical stimulation.  Sometimes, very severe cases of overactive bladder require  surgery to change the shape of the bladder. HOME CARE INSTRUCTIONS   Take medicines only as directed by your health care provider.  Use any implants or a pessary as directed by your health care provider.  Make any diet or lifestyle changes that are recommended by your health care provider. These might include:  Drinking less fluid or drinking at different times of the day. If you need to urinate often during the night, you may need to stop drinking fluids early in the evening.  Cutting down on caffeine or alcohol. Both can make an overactive bladder worse. Caffeine is found in coffee, tea, and sodas.  Doing Kegel exercises to strengthen muscles.  Losing weight if you need to.  Eating a healthy and balanced diet to prevent constipation.  Keep a journal or log to track how much and when you drink and also when you feel the need to urinate. This will help your health care provider to monitor your condition. SEEK MEDICAL CARE IF:  Your symptoms do not get better after treatment.  Your pain and discomfort are getting worse.  You have more frequent urges to urinate.  You have a fever. SEEK IMMEDIATE MEDICAL CARE IF: You are not able to control your bladder at all.   This information is not intended to replace advice given to you by your health care provider. Make sure you discuss any questions you have with your health care provider.   Document Released: 12/20/2008 Document Revised:  03/16/2014 Document Reviewed: 07/19/2013 Elsevier Interactive Patient Education [employer].

## 2015-02-19 NOTE — Progress Notes (Signed)
Share Etten [birth year] [id number]    History:    Presents for breast and pelvic exam. Postmenopausal with no bleeding. Had been on Prempro but weaned off of it and having numerous hot flushes, poor sleep and would like HRT again. Normal Pap and mammogram history. 2015 T score -1.6 femoral neck FRAX 7.7%/0.7%. History of epilepsy and depression, primary care manages labs and meds. Urinary frequency/overactive bladder with no relief on Ditropan. Colonoscopy 2014.  Past medical history, past surgical history, family history and social history were all reviewed and documented in the EPIC chart. Has not driven for greater than 10 years due to epilepsy/seizures. Mother and sister heart disease, diabetes, hypertension both deceased. Father hypertension. 1 stepdaughter.  ROS:  A ROS was performed and pertinent positives and negatives are included.  Exam:  Filed Vitals:   02/19/15 1006  BP: 126/80    General appearance:  Normal Thyroid:  Symmetrical, normal in size, without palpable masses or nodularity. Respiratory  Auscultation:  Clear without wheezing or rhonchi Cardiovascular  Auscultation:  Regular rate, without rubs, murmurs or gallops  Edema/varicosities:  Not grossly evident Abdominal  Soft,nontender, without masses, guarding or rebound.  Liver/spleen:  No organomegaly noted  Hernia:  None appreciated  Skin  Inspection:  Grossly normal   Breasts: Examined lying and sitting.     Right: Without masses, retractions, discharge or axillary adenopathy.     Left: Without masses, retractions, discharge or axillary adenopathy. Gentitourinary   Inguinal/mons:  Normal without inguinal adenopathy  External genitalia:  Normal  BUS/Urethra/Skene's glands:  Normal  Vagina: Atrophic  Cervix:  Normal  Uterus:  normal in size, shape and contour.  Midline and mobile  Adnexa/parametria:     Rt: Without masses or tenderness.   Lt: Without masses or tenderness.  Anus and  perineum: Normal  Digital rectal exam: Normal sphincter tone without palpated masses or tenderness  Assessment/Plan:  59 y.o. MWF G0 for breast and pelvic exam with numerous hot flashes and urinary frequency.  Postmenopausal/no HRT Epilepsy, depression-primary care manages labs and meds Osteopenia without elevated FRAX urinary frequency/overactive bladder  Plan: UA, urine culture pending. HRT options reviewed, reviewed risks of blood clots, strokes and breast cancer. Would like will try estradiol 0.5 and Aygestin 2.5 by mouth daily prescriptions for both given, reviewed best to use shortest amount of time. SBE's, continue annual screening 3-D mammogram, calcium rich diet, vitamin D 2000 daily encouraged. Home safety, fall prevention and importance of continuing weightbearing exercise reviewed. Has had about a 10 pound weight gain reviewed importance of decreasing carbohydrates. Pap with HR HPV typing, new screening guidelines reviewed.    Huel Cote [facility], 6:50 PM 02/19/2015

## 2015-02-21 LAB — CYTOLOGY - PAP

## 2015-03-10 HISTORY — PX: CATARACT EXTRACTION: SUR2

## 2015-03-12 DIAGNOSIS — H25011 Cortical age-related cataract, right eye: Secondary | ICD-10-CM | POA: Diagnosis not present

## 2015-03-12 DIAGNOSIS — H2512 Age-related nuclear cataract, left eye: Secondary | ICD-10-CM | POA: Diagnosis not present

## 2015-03-12 DIAGNOSIS — H2511 Age-related nuclear cataract, right eye: Secondary | ICD-10-CM | POA: Diagnosis not present

## 2015-03-12 DIAGNOSIS — H25012 Cortical age-related cataract, left eye: Secondary | ICD-10-CM | POA: Diagnosis not present

## 2015-03-18 DIAGNOSIS — H25812 Combined forms of age-related cataract, left eye: Secondary | ICD-10-CM | POA: Diagnosis not present

## 2015-03-18 DIAGNOSIS — H2512 Age-related nuclear cataract, left eye: Secondary | ICD-10-CM | POA: Diagnosis not present

## 2015-03-19 DIAGNOSIS — H2511 Age-related nuclear cataract, right eye: Secondary | ICD-10-CM | POA: Diagnosis not present

## 2015-03-29 DIAGNOSIS — H25811 Combined forms of age-related cataract, right eye: Secondary | ICD-10-CM | POA: Diagnosis not present

## 2015-03-29 DIAGNOSIS — H2511 Age-related nuclear cataract, right eye: Secondary | ICD-10-CM | POA: Diagnosis not present

## 2015-04-30 ENCOUNTER — Encounter: Payer: Self-pay | Admitting: Adult Health

## 2015-04-30 ENCOUNTER — Ambulatory Visit (INDEPENDENT_AMBULATORY_CARE_PROVIDER_SITE_OTHER): Payer: Medicare Other | Admitting: Adult Health

## 2015-04-30 VITALS — BP 116/66 | HR 67 | Ht 64.0 in | Wt 165.0 lb

## 2015-04-30 DIAGNOSIS — G40219 Localization-related (focal) (partial) symptomatic epilepsy and epileptic syndromes with complex partial seizures, intractable, without status epilepticus: Secondary | ICD-10-CM | POA: Diagnosis not present

## 2015-04-30 MED ORDER — FELBAMATE 600 MG PO TABS: ORAL_TABLET | ORAL | Status: DC

## 2015-04-30 MED ORDER — TOPIRAMATE 200 MG PO TABS: 200.0000 mg | ORAL_TABLET | Freq: Two times a day (BID) | ORAL | Status: DC

## 2015-04-30 MED ORDER — LACOSAMIDE 150 MG PO TABS: 1.0000 | ORAL_TABLET | Freq: Two times a day (BID) | ORAL | Status: DC

## 2015-04-30 NOTE — Progress Notes (Signed)
I have read the note, and I agree with the clinical assessment and plan.  [name],CHARLES KEITH   

## 2015-04-30 NOTE — Progress Notes (Signed)
PATIENT: Amber Bowman DOB: January 03, 1956  REASON FOR VISIT: follow up- partial complex type seizure HISTORY FROM: patient  HISTORY OF PRESENT ILLNESS: Amber Bowman is a 60 year old female with a history of partial complex type seizure events. She returns today for follow-up. She continues on Vimpat, felbamate and Topamax. She reports that she continues to have 3 seizures a month. Her seizures present differently. She will have lapses in time if his unwitnessed. Her fianc witnessed her last seizure which was on Sunday. He states that she walked off and ended up finding her at the far department to moderately. The patient has no recollection of this. The patient states that this normally occurs when she is upset or stressed about something. She denies any convulsing, loss of bowels or bladder or biting her tongue. In the past video EEG monitoring or vagal nerve stimulator has been recommended by the patient has not. The patient is still skeptical if she would like to consider these options. She does not make any adjustments to her medications. She does not operate a motor vehicle. She is able to complete all ADLs independently. She returns today for an evaluation.  HISTORY 12/25/14 (Amber Bowman): Amber Bowman is a 60 year old right-handed white female with a history of partial complex type seizure events. The patient usually has 1-3 seizures a month. The patient has recently had a seizure around 12/10/2014 while in [place]. The patient apparently had automatic behavior during the seizure, and she was detained for shoplifting. The patient has required an attorney for this issue. The patient returns this office for an evaluation. The patient is on 3 different seizure medications including Topamax, Felbatol, and Vimpat. Her seizures have never been fully controlled. She denies any new medical issues that have come up since last seen. She does not operate a motor vehicle.  REVIEW OF SYSTEMS: Out of a complete 14  system review of symptoms, the patient complains only of the following symptoms, and all other reviewed systems are negative.  Seizure  ALLERGIES: No Known Allergies  HOME MEDICATIONS: Outpatient Prescriptions Prior to Visit  Medication Sig Dispense Refill  . aspirin 81 MG tablet Take 81 mg by mouth daily.      Marland Kitchen CRANBERRY PO Take 1 tablet by mouth daily.    Marland Kitchen estradiol (ESTRACE) 0.5 MG tablet Take 1 tablet (0.5 mg total) by mouth daily. 30 tablet 12  . felbamate (FELBATOL) 600 MG tablet TAKE 2 TABLETS BY MOUTH 4 TIMES A DAY 240 tablet 6  . Lacosamide (VIMPAT) 150 MG TABS Take 1 tablet (150 mg total) by mouth 2 (two) times daily. 60 tablet 5  . Multiple Vitamins-Minerals (ICAPS AREDS FORMULA PO) Take by mouth.    . norethindrone (AYGESTIN) 5 MG tablet Take 0.5 tablets (2.5 mg total) by mouth daily. 30 tablet 12  . phenazopyridine (PYRIDIUM) 100 MG tablet Take 100 mg by mouth as needed.    . topiramate (TOPAMAX) 200 MG tablet Take 1 tablet (200 mg total) by mouth 2 (two) times daily. 60 tablet 6   No facility-administered medications prior to visit.    PAST MEDICAL HISTORY: Past Medical History  Diagnosis Date  . Condyloma   . Epilepsy ([place])   . Depression   . Colon polyps 2014    PAST SURGICAL HISTORY: No past surgical history on file.  FAMILY HISTORY: Family History  Problem Relation Age of Onset  . Hypertension Mother   . Diabetes Mother   . Heart disease Mother   . Hypertension  Father   . Heart disease Father   . Diabetes Sister   . Hypertension Sister   . Heart disease Sister   . Seizures Neg Hx     SOCIAL HISTORY: Social History   Social History  . Marital Status: Legally Separated    Spouse Name: N/A  . Number of Children: 0  . Years of Education: 13   Occupational History  .      Disabled.   Social History Main Topics  . Smoking status: Never Smoker   . Smokeless tobacco: Never Used  . Alcohol Use: No  . Drug Use: No  . Sexual Activity:     Partners: Male    Birth Control/ Protection: Post-menopausal   Other Topics Concern  . Not on file   Social History Narrative   Patient lives at home alone single.   Patient is disabled.   Education some college education.   Right handed.   Caffeine four cups of tea daily.      PHYSICAL EXAM  Filed Vitals:   04/30/15 1023  BP: 116/66  Pulse: 67  Height: 5\' 4"  (1.626 m)  Weight: 165 lb (74.844 kg)   Body mass index is 28.31 kg/(m^2).  Generalized: Well developed, in no acute distress   Neurological examination  Mentation: Alert oriented to time, place, history taking. Follows all commands speech and language fluent Cranial nerve II-XII: Pupils were equal round reactive to light. Extraocular movements were full, visual field were full on confrontational test. Facial sensation and strength were normal. Uvula tongue midline. Head turning and shoulder shrug  were normal and symmetric. Motor: The motor testing reveals 5 over 5 strength of all 4 extremities. Good symmetric motor tone is noted throughout.  Sensory: Sensory testing is intact to soft touch on all 4 extremities. No evidence of extinction is noted.  Coordination: Cerebellar testing reveals good finger-nose-finger and heel-to-shin bilaterally.  Gait and station: Gait is normal. Tandem gait is normal. Romberg is negative. No drift is seen.  Reflexes: Deep tendon reflexes are symmetric and normal bilaterally.   DIAGNOSTIC DATA (LABS, IMAGING, TESTING) - I reviewed patient records, labs, notes, testing and imaging myself where available.  Lab Results  Component Value Date   WBC 6.6 12/25/2014   HGB 13.5 03/06/2014   HCT 41.2 12/25/2014   MCV 91 12/25/2014   PLT 308 12/25/2014      Component Value Date/Time   NA 142 12/25/2014 1253   K 4.1 12/25/2014 1253   CL 104 12/25/2014 1253   CO2 21 12/25/2014 1253   GLUCOSE 91 12/25/2014 1253   BUN 10 12/25/2014 1253   CREATININE 0.66 12/25/2014 1253   CALCIUM 9.5  12/25/2014 1253   PROT 6.8 12/25/2014 1253   ALBUMIN 4.1 12/25/2014 1253   AST 15 12/25/2014 1253   ALT 16 12/25/2014 1253   ALKPHOS 162* 12/25/2014 1253   BILITOT <0.2 12/25/2014 1253   BILITOT <0.2 03/06/2014 1040   GFRNONAA 97 12/25/2014 1253   GFRAA 112 12/25/2014 1253      ASSESSMENT AND PLAN 60 y.o. year old female  has a past medical history of Condyloma; Epilepsy ([place]); Depression; and Colon polyps (2014). here with:  1. Seizures  The patient will continue on felbamate, Topamax and Vimpat. Refills have been sent. I spoke in great detail with the patient about the vagal nerve stimulator. The patient is interested. She was given information that she plans to look over and will let us know if she will like  to proceed forward. She will follow-up in 6 months or sooner if needed.     Ward Givens, MSN, NP-C 04/30/2015, 10:35 AM [facility] [address] [place], [place] [address] [phone]

## 2015-04-30 NOTE — Patient Instructions (Signed)
Medication refill sent Consider Video EEG monitoring or Vagal nerve stimulator Could also increase Vimpat .

## 2015-05-15 ENCOUNTER — Other Ambulatory Visit: Payer: Self-pay

## 2015-08-01 ENCOUNTER — Other Ambulatory Visit: Payer: Self-pay

## 2015-08-01 DIAGNOSIS — Z1231 Encounter for screening mammogram for malignant neoplasm of breast: Secondary | ICD-10-CM

## 2015-08-12 ENCOUNTER — Ambulatory Visit
Admission: RE | Admit: 2015-08-12 | Discharge: 2015-08-12 | Disposition: A | Discharge status: Home / Self Care | Payer: Medicare Other | Source: Ambulatory Visit

## 2015-08-12 DIAGNOSIS — Z1231 Encounter for screening mammogram for malignant neoplasm of breast: Secondary | ICD-10-CM | POA: Diagnosis not present

## 2015-10-29 ENCOUNTER — Ambulatory Visit (INDEPENDENT_AMBULATORY_CARE_PROVIDER_SITE_OTHER): Payer: Medicare Other | Admitting: Adult Health

## 2015-10-29 ENCOUNTER — Encounter: Payer: Self-pay | Admitting: Adult Health

## 2015-10-29 ENCOUNTER — Other Ambulatory Visit: Payer: Self-pay | Admitting: Adult Health

## 2015-10-29 VITALS — BP 127/73 | HR 76 | Ht 64.0 in | Wt 161.2 lb

## 2015-10-29 DIAGNOSIS — G40219 Localization-related (focal) (partial) symptomatic epilepsy and epileptic syndromes with complex partial seizures, intractable, without status epilepticus: Secondary | ICD-10-CM

## 2015-10-29 DIAGNOSIS — Z5181 Encounter for therapeutic drug level monitoring: Secondary | ICD-10-CM | POA: Diagnosis not present

## 2015-10-29 NOTE — Patient Instructions (Signed)
Continue felbamate topamax and vimpat Blood work today If your symptoms worsen or you develop new symptoms please let us know.

## 2015-10-29 NOTE — Progress Notes (Signed)
I have read the note, and I agree with the clinical assessment and plan.  Amber Bowman,Amber Bowman   

## 2015-10-29 NOTE — Progress Notes (Signed)
PATIENT: Amber Bowman DOB: 08-Mar-1956  REASON FOR VISIT: follow up- seizures HISTORY FROM: patient  HISTORY OF PRESENT ILLNESS: Today 10/29/2015 Ms. Amber Bowman is a 60 year old female with a history of partial complex type seizure events. She returns today for follow-up. She continues on Vimpat, felbamate and Topamax. She returns today for follow-up. She states that her seizures have done slightly better. She states that she has maybe had "1 or 2 events" since last seen. She does not operate a motor vehicle. She is able to complete all ADLs independently. Denies any changes with her gait or balance. He states that when she initially was started on Vimpat if she got up in the middle the night she would stumble sometimes fall but that has improved. Denies any new neurological symptoms. Returns today for an evaluation.  04/30/15: Mrs. Amber Bowman is a 60 year old female with a history of partial complex type seizure events. She returns today for follow-up. She continues on Vimpat, felbamate and Topamax. She reports that she continues to have 3 seizures a month. Her seizures present differently. She will have lapses in time if his unwitnessed. Her fianc witnessed her last seizure which was on Sunday. He states that she walked off and ended up finding her at the far department to moderately. The patient has no recollection of this. The patient states that this normally occurs when she is upset or stressed about something. She denies any convulsing, loss of bowels or bladder or biting her tongue. In the past video EEG monitoring or vagal nerve stimulator has been recommended by the patient has not. The patient is still skeptical if she would like to consider these options. She does not make any adjustments to her medications. She does not operate a motor vehicle. She is able to complete all ADLs independently. She returns today for an evaluation.  HISTORY 12/25/14 ([name]): Ms. Amber Bowman is a 60 year old  right-handed white female with a history of partial complex type seizure events. The patient usually has 1-3 seizures a month. The patient has recently had a seizure around 12/10/2014 while in [place]. The patient apparently had automatic behavior during the seizure, and she was detained for shoplifting. The patient has required an attorney for this issue. The patient returns this office for an evaluation. The patient is on 3 different seizure medications including Topamax, Felbatol, and Vimpat. Her seizures have never been fully controlled. She denies any new medical issues that have come up since last seen. She does not operate a motor vehicle.    REVIEW OF SYSTEMS: Out of a complete 14 system review of symptoms, the patient complains only of the following symptoms, and all other reviewed systems are negative  See history of present illness  ALLERGIES: No Known Allergies  HOME MEDICATIONS: Outpatient Medications Prior to Visit  Medication Sig Dispense Refill  . aspirin 81 MG tablet Take 81 mg by mouth daily.      Marland Kitchen CRANBERRY PO Take 1 tablet by mouth daily.    Marland Kitchen estradiol (ESTRACE) 0.5 MG tablet Take 1 tablet (0.5 mg total) by mouth daily. 30 tablet 12  . felbamate (FELBATOL) 600 MG tablet TAKE 2 TABLETS BY MOUTH 4 TIMES A DAY 240 tablet 6  . Lacosamide (VIMPAT) 150 MG TABS Take 1 tablet (150 mg total) by mouth 2 (two) times daily. (Patient taking differently: Take 1 tablet by mouth 2 (two) times daily. Takes 1/2 tablet in AM and 1 TABLET IN pm) 60 tablet 11  . Multiple Vitamins-Minerals (ICAPS  AREDS FORMULA PO) Take by mouth.    . norethindrone (AYGESTIN) 5 MG tablet Take 0.5 tablets (2.5 mg total) by mouth daily. 30 tablet 12  . topiramate (TOPAMAX) 200 MG tablet Take 1 tablet (200 mg total) by mouth 2 (two) times daily. 60 tablet 11  . phenazopyridine (PYRIDIUM) 100 MG tablet Take 100 mg by mouth as needed.     No facility-administered medications prior to visit.     PAST MEDICAL  HISTORY: Past Medical History:  Diagnosis Date  . Colon polyps 2014  . Condyloma   . Depression   . Epilepsy ([place])     PAST SURGICAL HISTORY: Past Surgical History:  Procedure Laterality Date  . CATARACT EXTRACTION  03/2015    FAMILY HISTORY: Family History  Problem Relation Age of Onset  . Hypertension Mother   . Diabetes Mother   . Heart disease Mother   . Hypertension Father   . Heart disease Father   . Diabetes Sister   . Hypertension Sister   . Heart disease Sister   . Seizures Neg Hx     SOCIAL HISTORY: Social History   Social History  . Marital status: Legally Separated    Spouse name: N/A  . Number of children: 0  . Years of education: 65   Occupational History  .      Disabled.   Social History Main Topics  . Smoking status: Never Smoker  . Smokeless tobacco: Never Used  . Alcohol use No  . Drug use: No  . Sexual activity: Yes    Partners: Male    Birth control/ protection: Post-menopausal   Other Topics Concern  . Not on file   Social History Narrative   Patient lives at home alone single.   Patient is disabled.   Education some college education.   Right handed.   Caffeine four cups of tea daily.      PHYSICAL EXAM  Vitals:   10/29/15 1027  BP: 127/73  Pulse: 76  Weight: 161 lb 3.2 oz (73.1 kg)  Height: 5\' 4"  (1.626 m)   Body mass index is 27.67 kg/m.  Generalized: Well developed, in no acute distress   Neurological examination  Mentation: Alert oriented to time, place, history taking. Follows all commands speech and language fluent Cranial nerve II-XII: Pupils were equal round reactive to light. Extraocular movements were full, visual field were full on confrontational test. Facial sensation and strength were normal. Uvula tongue midline. Head turning and shoulder shrug  were normal and symmetric. Motor: The motor testing reveals 5 over 5 strength of all 4 extremities. Good symmetric motor tone is noted throughout.  Sensory:  Sensory testing is intact to soft touch on all 4 extremities. No evidence of extinction is noted.  Coordination: Cerebellar testing reveals good finger-nose-finger and heel-to-shin bilaterally.  Gait and station: Gait is normal. Tandem gait is normal. Romberg is negative. No drift is seen.  Reflexes: Deep tendon reflexes are symmetric and normal bilaterally.   DIAGNOSTIC DATA (LABS, IMAGING, TESTING) - I reviewed patient records, labs, notes, testing and imaging myself where available.  Lab Results  Component Value Date   WBC 6.6 12/25/2014   HGB 13.5 03/06/2014   HCT 41.2 12/25/2014   MCV 91 12/25/2014   PLT 308 12/25/2014      Component Value Date/Time   NA 142 12/25/2014 1253   K 4.1 12/25/2014 1253   CL 104 12/25/2014 1253   CO2 21 12/25/2014 1253   GLUCOSE 91 12/25/2014  1253   BUN 10 12/25/2014 1253   CREATININE 0.66 12/25/2014 1253   CALCIUM 9.5 12/25/2014 1253   PROT 6.8 12/25/2014 1253   ALBUMIN 4.1 12/25/2014 1253   AST 15 12/25/2014 1253   ALT 16 12/25/2014 1253   ALKPHOS 162 (H) 12/25/2014 1253   BILITOT <0.2 12/25/2014 1253   GFRNONAA 97 12/25/2014 1253   GFRAA 112 12/25/2014 1253     ASSESSMENT AND PLAN 60 y.o. year old female  has a past medical history of Colon polyps (2014); Condyloma; Depression; and Epilepsy ([place]). here with:  1. Seizures  Overall the patient has read stable. She will continue on felbamate, Vimpat and Topamax. In the past we have talked about vagal nerve stimulator however the patient does not want to pursue this at this time. We will check blood work today. Patient advised that if her seizure events increase she should let us know. Will follow-up in 6 months with Dr. Jannifer Franklin.     Ward Givens, MSN, NP-C 10/29/2015, 11:15 AM [facility] [address] [place], [place] [address] [phone]

## 2015-10-31 ENCOUNTER — Telehealth: Payer: Self-pay | Admitting: *Deleted

## 2015-10-31 LAB — COMPREHENSIVE METABOLIC PANEL
A/G RATIO: 1.6 (ref 1.2–2.2)
ALBUMIN: 4.3 g/dL (ref 3.6–4.8)
ALT: 9 IU/L (ref 0–32)
AST: 13 IU/L (ref 0–40)
Alkaline Phosphatase: 93 IU/L (ref 39–117)
BILIRUBIN TOTAL: 0.3 mg/dL (ref 0.0–1.2)
BUN / CREAT RATIO: 18 (ref 12–28)
BUN: 13 mg/dL (ref 8–27)
CALCIUM: 9 mg/dL (ref 8.7–10.3)
CHLORIDE: 106 mmol/L (ref 96–106)
CO2: 20 mmol/L (ref 18–29)
Creatinine, Ser: 0.71 mg/dL (ref 0.57–1.00)
GFR, EST AFRICAN AMERICAN: 107 mL/min/{1.73_m2} (ref >59)
GFR, EST NON AFRICAN AMERICAN: 93 mL/min/{1.73_m2} (ref >59)
GLOBULIN, TOTAL: 2.7 g/dL (ref 1.5–4.5)
Glucose: 87 mg/dL (ref 65–99)
POTASSIUM: 4.8 mmol/L (ref 3.5–5.2)
Sodium: 143 mmol/L (ref 134–144)
TOTAL PROTEIN: 7 g/dL (ref 6.0–8.5)

## 2015-10-31 LAB — CBC WITH DIFFERENTIAL/PLATELET
Basophils Absolute: 0.1 x10E3/uL (ref 0.0–0.2)
Basos: 1 %
EOS (ABSOLUTE): 0.1 x10E3/uL (ref 0.0–0.4)
Eos: 1 %
Hematocrit: 41.8 % (ref 34.0–46.6)
Hemoglobin: 14 g/dL (ref 11.1–15.9)
Immature Grans (Abs): 0 x10E3/uL (ref 0.0–0.1)
Immature Granulocytes: 0 %
Lymphocytes Absolute: 2.4 x10E3/uL (ref 0.7–3.1)
Lymphs: 28 %
MCH: 32.2 pg (ref 26.6–33.0)
MCHC: 33.5 g/dL (ref 31.5–35.7)
MCV: 96 fL (ref 79–97)
Monocytes Absolute: 0.8 x10E3/uL (ref 0.1–0.9)
Monocytes: 9 %
Neutrophils Absolute: 5 x10E3/uL (ref 1.4–7.0)
Neutrophils: 61 %
Platelets: 338 x10E3/uL (ref 150–379)
RBC: 4.35 x10E6/uL (ref 3.77–5.28)
RDW: 13.8 % (ref 12.3–15.4)
WBC: 8.3 x10E3/uL (ref 3.4–10.8)

## 2015-10-31 LAB — FELBAMATE LEVEL: Felbamate Lvl: 88 ug/mL (ref 40–100)

## 2015-10-31 NOTE — Telephone Encounter (Signed)
Per Edman Circle, NP spoke with patient and informed her that her lab results are unremarkable. She verbalized understanding, appreciation.

## 2015-12-02 ENCOUNTER — Other Ambulatory Visit: Payer: Self-pay | Admitting: Adult Health

## 2015-12-06 DIAGNOSIS — Z23 Encounter for immunization: Secondary | ICD-10-CM | POA: Diagnosis not present

## 2016-02-20 ENCOUNTER — Ambulatory Visit (INDEPENDENT_AMBULATORY_CARE_PROVIDER_SITE_OTHER): Payer: Medicare Other | Admitting: Women's Health

## 2016-02-20 ENCOUNTER — Encounter: Payer: Self-pay | Admitting: Women's Health

## 2016-02-20 VITALS — BP 140/80 | Ht 64.0 in | Wt 155.0 lb

## 2016-02-20 DIAGNOSIS — B3731 Acute candidiasis of vulva and vagina: Secondary | ICD-10-CM

## 2016-02-20 DIAGNOSIS — E2839 Other primary ovarian failure: Secondary | ICD-10-CM | POA: Diagnosis not present

## 2016-02-20 DIAGNOSIS — M8580 Other specified disorders of bone density and structure, unspecified site: Secondary | ICD-10-CM | POA: Diagnosis not present

## 2016-02-20 DIAGNOSIS — B373 Candidiasis of vulva and vagina: Secondary | ICD-10-CM | POA: Diagnosis not present

## 2016-02-20 LAB — WET PREP FOR TRICH, YEAST, CLUE
CLUE CELLS WET PREP: NONE SEEN
TRICH WET PREP: NONE SEEN
WBC, Wet Prep HPF POC: NONE SEEN
Yeast Wet Prep HPF POC: NONE SEEN

## 2016-02-20 MED ORDER — FLUCONAZOLE 150 MG PO TABS: 150.0000 mg | ORAL_TABLET | Freq: Once | ORAL | 1 refills | Status: AC

## 2016-02-20 MED ORDER — NYSTATIN 100000 UNIT/GM EX CREA: 1.0000 "application " | TOPICAL_CREAM | Freq: Two times a day (BID) | CUTANEOUS | 0 refills | Status: DC

## 2016-02-20 NOTE — Progress Notes (Signed)
Amber Bowman 1955/08/17 [id number]    History:    Presents for breast and pelvic exam.  Reports vaginal itching and burning for 2 days, denies odor, abdominal pain or urinary symptoms.  Postmenopausal, on HRT.  Not sexually active, partners health.  2015 T score -1.6 femoral neck FRAX 7.7%/0.7%.  Colonoscopy negative 2014. Normal Pap 2016.  Mammogram 08/2015. Epilepsy managed by primary care.   Past medical history, past surgical history, family history and social history were all reviewed and documented in the EPIC chart. Appears well.  Does not drive due to seizures.  Same partner for 14 years, healthy.  1 stepdaughter.  Mother and sister hypertension, diabetes and heart disease both deceased, sister suicide.     ROS:  A ROS was performed and pertinent positives and negatives are included.  Exam:  Vitals:   02/20/16 0927  BP: 140/80  Weight: 155 lb (70.3 kg)  Height: 5\' 4"  (1.626 m)   Body mass index is 26.61 kg/m.   General appearance:  Normal Thyroid:  Symmetrical, normal in size, without palpable masses or nodularity. Respiratory  Auscultation:  Clear without wheezing or rhonchi Cardiovascular  Auscultation:  Regular rate, without rubs, murmurs or gallops  Edema/varicosities:  Not grossly evident Abdominal  Soft,nontender, without masses, guarding or rebound.  Liver/spleen:  No organomegaly noted  Hernia:  None appreciated  Skin  Inspection:  Grossly normal   Breasts: Examined lying and sitting.     Right: Without masses, retractions, discharge or axillary adenopathy.     Left: Without masses, retractions, discharge or axillary adenopathy. Gentitourinary   Inguinal/mons:  Normal without inguinal adenopathy  External genitalia:  Normal  BUS/Urethra/Skene's glands:  Normal  Vagina:  Mucosa erythematous with moderate amount white discharge, Wet prep negative cream noted on wet prep    Cervix:  Normal  Uterus:  Normal in size, shape and contour.  Midline and  mobile  Adnexa/parametria:     Rt: Without masses or tenderness.   Lt: Without masses or tenderness.  Anus and perineum: Normal  Digital rectal exam: Normal sphincter tone without palpated masses or tenderness  Assessment/Plan:  60 y.o.  for breast and pelvic exam with complaint of vaginal itching and burning.   Postmenopausal, on HRT with no bleeding Clinical Yeast Vaginitis Osteopenia without elevated FRAX Epilepsy, managed by primary care  Plan: . Nystatin cream 1 application 2 times daily externally.  Diflucan 150 mg 1 tablet.  Discontinue norethindrone and estradiol, instructions given to decrease use gradually.  Will schedule DEXA. Safety, fall prevention and importance of weightbearing exercise reviewed. Continue SBEs, annual mammogram, heart healthy diet and exercise.  Graduated on 20 pound weight loss from last year. Pap normal 2016, new screening guidelines reviewed.      [place], 10:14 AM 02/20/2016

## 2016-02-20 NOTE — Patient Instructions (Signed)

## 2016-02-26 ENCOUNTER — Telehealth: Payer: Self-pay | Admitting: *Deleted

## 2016-02-26 NOTE — Telephone Encounter (Signed)
Phone call, states has had no relief of vaginal burning with some itching, wet prep was negative at office visit. Will try some over-the-counter A and D ointment, reviewed, and after menopause.

## 2016-02-26 NOTE — Telephone Encounter (Signed)
Patient called c/o vaginal burning and itching was seen on office visit 02/20/16 prescribed nystatin cream to use externally and took diflucan 150 mg dose and no relief. Pt asked if another Rx could be prescribed? Please advise

## 2016-04-05 ENCOUNTER — Other Ambulatory Visit: Payer: Self-pay | Admitting: Adult Health

## 2016-04-30 ENCOUNTER — Encounter: Payer: Self-pay | Admitting: Neurology

## 2016-04-30 ENCOUNTER — Ambulatory Visit (INDEPENDENT_AMBULATORY_CARE_PROVIDER_SITE_OTHER): Payer: Medicare Other | Admitting: Neurology

## 2016-04-30 VITALS — BP 125/78 | HR 68 | Ht 64.0 in | Wt 156.0 lb

## 2016-04-30 DIAGNOSIS — Z5181 Encounter for therapeutic drug level monitoring: Secondary | ICD-10-CM

## 2016-04-30 DIAGNOSIS — G40219 Localization-related (focal) (partial) symptomatic epilepsy and epileptic syndromes with complex partial seizures, intractable, without status epilepticus: Secondary | ICD-10-CM | POA: Diagnosis not present

## 2016-04-30 MED ORDER — LEVETIRACETAM 500 MG PO TABS: 500.0000 mg | ORAL_TABLET | Freq: Two times a day (BID) | ORAL | 5 refills | Status: DC

## 2016-04-30 NOTE — Progress Notes (Signed)
Reason for visit: Seizures  Amber Bowman is an 61 y.o. female  History of present illness:  Amber Bowman is a 61 year old right-handed white female with a history of intractable partial complex type seizures. The patient on average has about one seizure a month, the seizures are brief lasting 2 or 3 minutes and are associated with a staring event. The patient does not recall events during the seizure. She does not injure herself. The seizures or nonconvulsive. The patient has been on high-dose Topamax and felbamate without full control of the seizures. She has been on a number of seizure medications in the past including Zonegran. She returns for an evaluation. She has not wished to pursue evaluation for possible epilepsy surgery or consider a vagal nerve stimulator.  Past Medical History:  Diagnosis Date  . Colon polyps 2014  . Condyloma   . Depression   . Epilepsy [facility])     Past Surgical History:  Procedure Laterality Date  . CATARACT EXTRACTION  03/2015    Family History  Problem Relation Age of Onset  . Hypertension Mother   . Diabetes Mother   . Heart disease Mother   . Hypertension Father   . Heart disease Father   . Diabetes Sister   . Hypertension Sister   . Heart disease Sister   . Seizures Neg Hx     Social history:  reports that she has never smoked. She has never used smokeless tobacco. She reports that she does not drink alcohol or use drugs.   No Known Allergies  Medications:  Prior to Admission medications   Medication Sig Start Date End Date Taking? Authorizing Provider  aspirin 81 MG tablet Take 81 mg by mouth daily.     Yes Historical Provider, MD  CRANBERRY PO Take 1 tablet by mouth daily.   Yes Historical Provider, MD  felbamate (FELBATOL) 600 MG tablet TAKE 2 TABLETS BY MOUTH 4 TIMES A DAY 12/02/15  Yes Ward Givens, NP  Multiple Vitamins-Minerals (ICAPS AREDS FORMULA PO) Take by mouth.   Yes Historical Provider, MD  topiramate (TOPAMAX) 200 MG  tablet TAKE 1 TABLET (200 MG TOTAL) BY MOUTH 2 (TWO) TIMES DAILY. 04/06/16  Yes Ward Givens, NP  levETIRAcetam (KEPPRA) 500 MG tablet Take 1 tablet (500 mg total) by mouth 2 (two) times daily. 04/30/16   Kathrynn Ducking, MD    ROS:  Out of a complete 14 system review of symptoms, the patient complains only of the following symptoms, and all other reviewed systems are negative.  Double vision Frequency of urination, decreased urine Back pain Memory loss, seizures  Blood pressure 125/78, pulse 68, height 5\' 4"  (1.626 m), weight 156 lb (70.8 kg), last menstrual period 02/11/2008, SpO2 98 %.  Physical Exam  General: The patient is alert and cooperative at the time of the examination. The patient is moderately obese.  Skin: No significant peripheral edema is noted.   Neurologic Exam  Mental status: The patient is alert and oriented x 3 at the time of the examination. The patient has apparent normal recent and remote memory, with an apparently normal attention span and concentration ability.   Cranial nerves: Facial symmetry is present. Speech is normal, no aphasia or dysarthria is noted. Extraocular movements are full. Visual fields are full.  Motor: The patient has good strength in all 4 extremities.  Sensory examination: Soft touch sensation is symmetric on the face, arms, and legs.  Coordination: The patient has good finger-nose-finger and heel-to-shin  bilaterally.  Gait and station: The patient has a normal gait. Tandem gait is normal. Romberg is negative. No drift is seen.  Reflexes: Deep tendon reflexes are symmetric.   Assessment/Plan:  1. Intractable partial complex seizures  The patient will be sent for blood work today. Keppra in low dose will be added to her seizure medication regimen. We will start at 250 twice daily for 2 weeks, then go to 500 mg twice daily. She will follow-up in 6 months. The patient was on Vimpat, but she stopped the medication secondary to  side effects with dizziness. The patient does not operate a motor vehicle.  Amber Alexanders MD 04/30/2016 9:33 AM  [facility] [address] [place], [place] [address]  Phone [phone] Fax [phone]

## 2016-04-30 NOTE — Patient Instructions (Signed)
   With the Keppra 500 mg tablet, take 1/2 tablet twice a day for 2 weeks, then take 1 twice a day.  Roque Lias

## 2016-05-02 LAB — CBC WITH DIFFERENTIAL/PLATELET
BASOS ABS: 0.1 10*3/uL (ref 0.0–0.2)
Basos: 2 %
EOS (ABSOLUTE): 0.1 10*3/uL (ref 0.0–0.4)
Eos: 2 %
HEMOGLOBIN: 14.3 g/dL (ref 11.1–15.9)
Hematocrit: 43 % (ref 34.0–46.6)
Immature Grans (Abs): 0 10*3/uL (ref 0.0–0.1)
Immature Granulocytes: 0 %
LYMPHS ABS: 2.3 10*3/uL (ref 0.7–3.1)
Lymphs: 28 %
MCH: 31.6 pg (ref 26.6–33.0)
MCHC: 33.3 g/dL (ref 31.5–35.7)
MCV: 95 fL (ref 79–97)
MONOCYTES: 9 %
MONOS ABS: 0.7 10*3/uL (ref 0.1–0.9)
Neutrophils Absolute: 4.9 10*3/uL (ref 1.4–7.0)
Neutrophils: 59 %
PLATELETS: 348 10*3/uL (ref 150–379)
RBC: 4.52 x10E6/uL (ref 3.77–5.28)
RDW: 13.7 % (ref 12.3–15.4)
WBC: 8.1 10*3/uL (ref 3.4–10.8)

## 2016-05-02 LAB — TOPIRAMATE LEVEL: TOPIRAMATE LVL: 10.6 ug/mL (ref 2.0–25.0)

## 2016-05-02 LAB — COMPREHENSIVE METABOLIC PANEL
ALBUMIN: 4.3 g/dL (ref 3.6–4.8)
ALK PHOS: 115 IU/L (ref 39–117)
ALT: 19 IU/L (ref 0–32)
AST: 13 IU/L (ref 0–40)
Albumin/Globulin Ratio: 1.7 (ref 1.2–2.2)
BUN / CREAT RATIO: 18 (ref 12–28)
BUN: 15 mg/dL (ref 8–27)
CO2: 23 mmol/L (ref 18–29)
CREATININE: 0.84 mg/dL (ref 0.57–1.00)
Calcium: 9.6 mg/dL (ref 8.7–10.3)
Chloride: 107 mmol/L — ABNORMAL HIGH (ref 96–106)
GFR calc non Af Amer: 75 mL/min/{1.73_m2} (ref >59)
GFR, EST AFRICAN AMERICAN: 87 mL/min/{1.73_m2} (ref >59)
GLUCOSE: 84 mg/dL (ref 65–99)
Globulin, Total: 2.6 g/dL (ref 1.5–4.5)
Potassium: 4.7 mmol/L (ref 3.5–5.2)
Sodium: 144 mmol/L (ref 134–144)
TOTAL PROTEIN: 6.9 g/dL (ref 6.0–8.5)

## 2016-05-02 LAB — FELBAMATE LEVEL: FELBAMATE LVL: 66 ug/mL (ref 40–100)

## 2016-05-04 ENCOUNTER — Telehealth: Payer: Self-pay | Admitting: *Deleted

## 2016-05-04 NOTE — Telephone Encounter (Signed)
-----   Message from Kathrynn Ducking, MD sent at 05/03/2016  5:33 PM EST ----- The blood work results are unremarkable. Felbamate and Topamax levels are therapeutic. Please call the patient. ----- Message ----- From: Lavone Neri Lab Results In Sent: 05/01/2016   7:42 AM To: Kathrynn Ducking, MD

## 2016-05-04 NOTE — Telephone Encounter (Signed)
Called and LVM for pt about lab results per CW,MD note. (ok per DPR). Gave [facility] phone number if she has further questions or concerns.

## 2016-07-02 ENCOUNTER — Other Ambulatory Visit: Payer: Self-pay | Admitting: Women's Health

## 2016-07-02 DIAGNOSIS — Z1231 Encounter for screening mammogram for malignant neoplasm of breast: Secondary | ICD-10-CM

## 2016-07-22 ENCOUNTER — Encounter: Payer: Self-pay | Admitting: Gynecology

## 2016-07-29 DIAGNOSIS — H04129 Dry eye syndrome of unspecified lacrimal gland: Secondary | ICD-10-CM | POA: Diagnosis not present

## 2016-08-07 DIAGNOSIS — M8580 Other specified disorders of bone density and structure, unspecified site: Secondary | ICD-10-CM

## 2016-08-07 HISTORY — DX: Other specified disorders of bone density and structure, unspecified site: M85.80

## 2016-08-12 ENCOUNTER — Other Ambulatory Visit: Payer: Self-pay | Admitting: *Deleted

## 2016-08-12 ENCOUNTER — Ambulatory Visit
Admission: RE | Admit: 2016-08-12 | Discharge: 2016-08-12 | Disposition: A | Discharge status: Home / Self Care | Payer: Medicare Other | Source: Ambulatory Visit | Attending: Gynecology | Admitting: Gynecology

## 2016-08-12 ENCOUNTER — Ambulatory Visit
Admission: RE | Admit: 2016-08-12 | Discharge: 2016-08-12 | Disposition: A | Discharge status: Home / Self Care | Payer: Medicare Other | Source: Ambulatory Visit | Attending: Women's Health | Admitting: Women's Health

## 2016-08-12 DIAGNOSIS — Z1231 Encounter for screening mammogram for malignant neoplasm of breast: Secondary | ICD-10-CM | POA: Diagnosis not present

## 2016-08-12 DIAGNOSIS — M8580 Other specified disorders of bone density and structure, unspecified site: Secondary | ICD-10-CM

## 2016-08-12 DIAGNOSIS — E2839 Other primary ovarian failure: Secondary | ICD-10-CM

## 2016-08-20 ENCOUNTER — Other Ambulatory Visit: Payer: Self-pay | Admitting: Gynecology

## 2016-08-20 ENCOUNTER — Ambulatory Visit (INDEPENDENT_AMBULATORY_CARE_PROVIDER_SITE_OTHER): Payer: Medicare Other

## 2016-08-20 DIAGNOSIS — Z780 Asymptomatic menopausal state: Secondary | ICD-10-CM

## 2016-08-20 DIAGNOSIS — M8589 Other specified disorders of bone density and structure, multiple sites: Secondary | ICD-10-CM | POA: Diagnosis not present

## 2016-08-20 DIAGNOSIS — M8580 Other specified disorders of bone density and structure, unspecified site: Secondary | ICD-10-CM

## 2016-08-24 ENCOUNTER — Encounter: Payer: Self-pay | Admitting: Gynecology

## 2016-10-07 ENCOUNTER — Other Ambulatory Visit: Payer: Self-pay | Admitting: Neurology

## 2016-10-25 ENCOUNTER — Other Ambulatory Visit: Payer: Self-pay | Admitting: Adult Health

## 2016-10-29 ENCOUNTER — Encounter: Payer: Self-pay | Admitting: Adult Health

## 2016-10-29 ENCOUNTER — Ambulatory Visit (INDEPENDENT_AMBULATORY_CARE_PROVIDER_SITE_OTHER): Payer: Medicare Other | Admitting: Adult Health

## 2016-10-29 VITALS — BP 102/63 | HR 75 | Wt 160.2 lb

## 2016-10-29 DIAGNOSIS — Z5181 Encounter for therapeutic drug level monitoring: Secondary | ICD-10-CM

## 2016-10-29 DIAGNOSIS — R569 Unspecified convulsions: Secondary | ICD-10-CM | POA: Diagnosis not present

## 2016-10-29 NOTE — Progress Notes (Signed)
PATIENT: Amber Bowman DOB: 1955-09-01  REASON FOR VISIT: follow up- seizures HISTORY FROM: patient  HISTORY OF PRESENT ILLNESS: Today 10/29/16 Amber Bowman is a 61 year old female with a history of intractable partial complex type seizures. She returns today for follow-up. She denies any seizure events. Denies any staring events. She remains on Topamax and felbamate. She is able to complete all ADLs independently. Reports that she does not operate a motor vehicle. She states that she walks approximately 3 miles daily. She denies any new neurological symptoms. She returns today for an evaluation.  HISTORY 04/30/16: Amber Bowman is a 61 year old right-handed white female with a history of intractable partial complex type seizures. The patient on average has about one seizure a month, the seizures are brief lasting 2 or 3 minutes and are associated with a staring event. The patient does not recall events during the seizure. She does not injure herself. The seizures or nonconvulsive. The patient has been on high-dose Topamax and felbamate without full control of the seizures. She has been on a number of seizure medications in the past including Zonegran. She returns for an evaluation. She has not wished to pursue evaluation for possible epilepsy surgery or consider a vagal nerve stimulator.  REVIEW OF SYSTEMS: Out of a complete 14 system review of symptoms, the patient complains only of the following symptoms, and all other reviewed systems are negative. See HPI  ALLERGIES: No Known Allergies  HOME MEDICATIONS: Outpatient Medications Prior to Visit  Medication Sig Dispense Refill  . aspirin 81 MG tablet Take 81 mg by mouth daily.      Marland Kitchen CRANBERRY PO Take 1 tablet by mouth daily.    . felbamate (FELBATOL) 600 MG tablet TAKE 2 TABLETS BY MOUTH 4 TIMES A DAY 240 tablet 11  . levETIRAcetam (KEPPRA) 500 MG tablet TAKE 1 TABLET (500 MG TOTAL) BY MOUTH 2 (TWO) TIMES DAILY. 60 tablet 5  . Multiple  Vitamins-Minerals (ICAPS AREDS FORMULA PO) Take by mouth.    . topiramate (TOPAMAX) 200 MG tablet TAKE 1 TABLET (200 MG TOTAL) BY MOUTH 2 (TWO) TIMES DAILY. 60 tablet 11   No facility-administered medications prior to visit.     PAST MEDICAL HISTORY: Past Medical History:  Diagnosis Date  . Colon polyps 2014  . Condyloma   . Depression   . Epilepsy ([place])   . Osteopenia 08/2016   T score -1.6 FRAX 8.5%/0.8%  . Seizures ([place])     PAST SURGICAL HISTORY: Past Surgical History:  Procedure Laterality Date  . CATARACT EXTRACTION  03/2015    FAMILY HISTORY: Family History  Problem Relation Age of Onset  . Hypertension Mother   . Diabetes Mother   . Heart disease Mother   . Hypertension Father   . Heart disease Father   . Diabetes Sister   . Hypertension Sister   . Heart disease Sister   . Seizures Neg Hx     SOCIAL HISTORY: Social History   Social History  . Marital status: Legally Separated    Spouse name: N/A  . Number of children: 0  . Years of education: 37   Occupational History  .      Disabled.   Social History Main Topics  . Smoking status: Never Smoker  . Smokeless tobacco: Never Used  . Alcohol use No  . Drug use: No  . Sexual activity: Yes    Partners: Male    Birth control/ protection: Post-menopausal   Other Topics Concern  .  Not on file   Social History Narrative   Patient lives at home alone single.   Patient is disabled.   Education some college education.   Right handed.   Caffeine four cups of tea daily.      PHYSICAL EXAM  Vitals:   10/29/16 1108  BP: 102/63  Pulse: 75  Weight: 160 lb 3.2 oz (72.7 kg)   Body mass index is 27.5 kg/m.  Generalized: Well developed, in no acute distress   Neurological examination  Mentation: Alert oriented to time, place, history taking. Follows all commands speech and language fluent Cranial nerve II-XII: Pupils were equal round reactive to light. Extraocular movements were full, visual  field were full on confrontational test. Facial sensation and strength were normal. Uvula tongue midline. Head turning and shoulder shrug  were normal and symmetric. Motor: The motor testing reveals 5 over 5 strength of all 4 extremities. Good symmetric motor tone is noted throughout.  Sensory: Sensory testing is intact to soft touch on all 4 extremities. No evidence of extinction is noted.  Coordination: Cerebellar testing reveals good finger-nose-finger and heel-to-shin bilaterally.  Gait and station: Gait is normal. Tandem gait is normal. Romberg is negative. No drift is seen.  Reflexes: Deep tendon reflexes are symmetric and normal bilaterally.   DIAGNOSTIC DATA (LABS, IMAGING, TESTING) - I reviewed patient records, labs, notes, testing and imaging myself where available.  Lab Results  Component Value Date   WBC 8.1 04/30/2016   HGB 14.3 04/30/2016   HCT 43.0 04/30/2016   MCV 95 04/30/2016   PLT 348 04/30/2016      Component Value Date/Time   NA 144 04/30/2016 0940   K 4.7 04/30/2016 0940   CL 107 (H) 04/30/2016 0940   CO2 23 04/30/2016 0940   GLUCOSE 84 04/30/2016 0940   BUN 15 04/30/2016 0940   CREATININE 0.84 04/30/2016 0940   CALCIUM 9.6 04/30/2016 0940   PROT 6.9 04/30/2016 0940   ALBUMIN 4.3 04/30/2016 0940   AST 13 04/30/2016 0940   ALT 19 04/30/2016 0940   ALKPHOS 115 04/30/2016 0940   BILITOT <0.2 04/30/2016 0940   GFRNONAA 75 04/30/2016 0940   GFRAA 87 04/30/2016 0940      ASSESSMENT AND PLAN 61 y.o. year old female  has a past medical history of Colon polyps (2014); Condyloma; Depression; Epilepsy ([place]); Osteopenia (08/2016); and Seizures ([place]). here with :  1. Seizures  Overall patient is doing well. She will continue on Topamax and felbamate. I will check blood work today. She is advised that if her symptoms worsen or she develops new symptoms she should let us know. She will follow-up in 6 months or sooner if needed.  I spent 15 minutes with the  patient. 50% of this time was spent discussing patient's medications as well as seizure precautions.    Ward Givens, MSN, NP-C 10/29/2016, 11:31 AM [facility] [address] [place], [place] [address] [phone]

## 2016-10-29 NOTE — Patient Instructions (Signed)
Your Plan:  Continue Topamax and Felbamate Blood work today If your symptoms worsen or you develop new symptoms please let us know.    Thank you for coming to see [place] at [facility]. I hope we have been able to provide you high quality care today.  You may receive a patient satisfaction survey over the next few weeks. We would appreciate your feedback and comments so that we may continue to improve ourselves and the health of our patients.

## 2016-10-29 NOTE — Progress Notes (Signed)
I have read the note, and I agree with the clinical assessment and plan.  Freyja Govea KEITH   

## 2016-10-31 LAB — COMPREHENSIVE METABOLIC PANEL
A/G RATIO: 1.5 (ref 1.2–2.2)
ALBUMIN: 4.4 g/dL (ref 3.6–4.8)
ALT: 23 IU/L (ref 0–32)
AST: 21 IU/L (ref 0–40)
Alkaline Phosphatase: 161 IU/L — ABNORMAL HIGH (ref 39–117)
BUN/Creatinine Ratio: 21 (ref 12–28)
BUN: 15 mg/dL (ref 8–27)
Bilirubin Total: <0.2 mg/dL (ref 0.0–1.2)
CALCIUM: 9.8 mg/dL (ref 8.7–10.3)
CO2: 22 mmol/L (ref 20–29)
CREATININE: 0.71 mg/dL (ref 0.57–1.00)
Chloride: 103 mmol/L (ref 96–106)
GFR calc non Af Amer: 92 mL/min/{1.73_m2} (ref >59)
GFR, EST AFRICAN AMERICAN: 106 mL/min/{1.73_m2} (ref >59)
Globulin, Total: 3 g/dL (ref 1.5–4.5)
Glucose: 84 mg/dL (ref 65–99)
Potassium: 4.5 mmol/L (ref 3.5–5.2)
Sodium: 141 mmol/L (ref 134–144)
TOTAL PROTEIN: 7.4 g/dL (ref 6.0–8.5)

## 2016-10-31 LAB — TOPIRAMATE LEVEL: Topiramate Lvl: 11.9 ug/mL (ref 2.0–25.0)

## 2016-10-31 LAB — CBC WITH DIFFERENTIAL/PLATELET
BASOS: 1 %
Basophils Absolute: 0 10*3/uL (ref 0.0–0.2)
EOS (ABSOLUTE): 0.1 10*3/uL (ref 0.0–0.4)
EOS: 1 %
HEMATOCRIT: 42.7 % (ref 34.0–46.6)
Hemoglobin: 14.1 g/dL (ref 11.1–15.9)
IMMATURE GRANS (ABS): 0 10*3/uL (ref 0.0–0.1)
IMMATURE GRANULOCYTES: 0 %
Lymphocytes Absolute: 1.8 10*3/uL (ref 0.7–3.1)
Lymphs: 30 %
MCH: 31.1 pg (ref 26.6–33.0)
MCHC: 33 g/dL (ref 31.5–35.7)
MCV: 94 fL (ref 79–97)
Monocytes Absolute: 0.7 10*3/uL (ref 0.1–0.9)
Monocytes: 12 %
NEUTROS ABS: 3.5 10*3/uL (ref 1.4–7.0)
NEUTROS PCT: 56 %
Platelets: 335 10*3/uL (ref 150–379)
RBC: 4.53 x10E6/uL (ref 3.77–5.28)
RDW: 14.1 % (ref 12.3–15.4)
WBC: 6.1 10*3/uL (ref 3.4–10.8)

## 2016-10-31 LAB — FELBAMATE LEVEL: Felbamate Lvl: 129 ug/mL — ABNORMAL HIGH (ref 40–100)

## 2016-11-05 ENCOUNTER — Telehealth: Payer: Self-pay | Admitting: *Deleted

## 2016-11-05 DIAGNOSIS — Z5181 Encounter for therapeutic drug level monitoring: Secondary | ICD-10-CM

## 2016-11-05 NOTE — Telephone Encounter (Signed)
LVM informing the patient that her labs are fine except for the felbamate level which is is elevated. Advised her that this was not a trough level. This RN suggested she can come in prior to her morning dose to have blood work to get a true level. Requested she call back to let this RN know if she would like to do a repeat lab so that NP can place order. Left number.

## 2016-11-10 NOTE — Telephone Encounter (Addendum)
Pt returned RN's call. She is willing to come back to clinic for repeat labs. Please call her at [phone] to let her know when order has been placed. Thank you

## 2016-11-10 NOTE — Telephone Encounter (Signed)
Called home phone, no answer, did not LVM. Called cell # and spoke with patient. Advised her the repeat lab order is in; she needs to come in after 8 am but before she takes Felbatol to have blood drawn. She will then take medication at her usual time, which she stated was around 10-11 am.  Advised her she does not need an appt for the lab. She repeated correctly the instructions, stated she would come tomorrow morning.

## 2016-11-10 NOTE — Telephone Encounter (Signed)
Order placed

## 2016-11-10 NOTE — Addendum Note (Signed)
Addended by: Trudie Buckler on: 11/10/2016 10:16 AM   Modules accepted: Orders

## 2016-11-11 ENCOUNTER — Other Ambulatory Visit (INDEPENDENT_AMBULATORY_CARE_PROVIDER_SITE_OTHER): Payer: Self-pay

## 2016-11-11 DIAGNOSIS — Z0289 Encounter for other administrative examinations: Secondary | ICD-10-CM

## 2016-11-11 DIAGNOSIS — Z5181 Encounter for therapeutic drug level monitoring: Secondary | ICD-10-CM | POA: Diagnosis not present

## 2016-11-13 LAB — FELBAMATE LEVEL: Felbamate Lvl: 114 ug/mL — ABNORMAL HIGH (ref 40–100)

## 2016-11-17 ENCOUNTER — Telehealth: Payer: Self-pay | Admitting: *Deleted

## 2016-11-17 NOTE — Telephone Encounter (Signed)
LVM informing patient her Felbamate level is still slightly high but no signs of toxicity. Advised her Jinny Blossom will continue to monitor. Left number for any questions.

## 2016-12-02 DIAGNOSIS — Z23 Encounter for immunization: Secondary | ICD-10-CM | POA: Diagnosis not present

## 2017-01-25 ENCOUNTER — Other Ambulatory Visit: Payer: Self-pay | Admitting: Adult Health

## 2017-02-23 ENCOUNTER — Encounter: Payer: Self-pay | Admitting: Women's Health

## 2017-02-23 ENCOUNTER — Ambulatory Visit (INDEPENDENT_AMBULATORY_CARE_PROVIDER_SITE_OTHER): Payer: Medicare Other | Admitting: Women's Health

## 2017-02-23 VITALS — BP 134/80 | Ht 64.0 in | Wt 163.0 lb

## 2017-02-23 DIAGNOSIS — M810 Age-related osteoporosis without current pathological fracture: Secondary | ICD-10-CM

## 2017-02-23 DIAGNOSIS — Z01419 Encounter for gynecological examination (general) (routine) without abnormal findings: Secondary | ICD-10-CM | POA: Diagnosis not present

## 2017-02-23 DIAGNOSIS — M8580 Other specified disorders of bone density and structure, unspecified site: Secondary | ICD-10-CM

## 2017-02-23 NOTE — Patient Instructions (Signed)
Shingles vaccine  shingrex  2 shot vaccine  Health Maintenance for Postmenopausal Women Menopause is a normal process in which your reproductive ability comes to an end. This process happens gradually over a span of months to years, usually between the ages of 54 and 78. Menopause is complete when you have missed 12 consecutive menstrual periods. It is important to talk with your health care provider about some of the most common conditions that affect postmenopausal women, such as heart disease, cancer, and bone loss (osteoporosis). Adopting a healthy lifestyle and getting preventive care can help to promote your health and wellness. Those actions can also lower your chances of developing some of these common conditions. What should I know about menopause? During menopause, you may experience a number of symptoms, such as:  Moderate-to-severe hot flashes.  Night sweats.  Decrease in sex drive.  Mood swings.  Headaches.  Tiredness.  Irritability.  Memory problems.  Insomnia.  Choosing to treat or not to treat menopausal changes is an individual decision that you make with your health care provider. What should I know about hormone replacement therapy and supplements? Hormone therapy products are effective for treating symptoms that are associated with menopause, such as hot flashes and night sweats. Hormone replacement carries certain risks, especially as you become older. If you are thinking about using estrogen or estrogen with progestin treatments, discuss the benefits and risks with your health care provider. What should I know about heart disease and stroke? Heart disease, heart attack, and stroke become more likely as you age. This may be due, in part, to the hormonal changes that your body experiences during menopause. These can affect how your body processes dietary fats, triglycerides, and cholesterol. Heart attack and stroke are both medical emergencies. There are many things  that you can do to help prevent heart disease and stroke:  Have your blood pressure checked at least every 1-2 years. High blood pressure causes heart disease and increases the risk of stroke.  If you are 50-79 years old, ask your health care provider if you should take aspirin to prevent a heart attack or a stroke.  Do not use any tobacco products, including cigarettes, chewing tobacco, or electronic cigarettes. If you need help quitting, ask your health care provider.  It is important to eat a healthy diet and maintain a healthy weight. ? Be sure to include plenty of vegetables, fruits, low-fat dairy products, and lean protein. ? Avoid eating foods that are high in solid fats, added sugars, or salt (sodium).  Get regular exercise. This is one of the most important things that you can do for your health. ? Try to exercise for at least 150 minutes each week. The type of exercise that you do should increase your heart rate and make you sweat. This is known as moderate-intensity exercise. ? Try to do strengthening exercises at least twice each week. Do these in addition to the moderate-intensity exercise.  Know your numbers.Ask your health care provider to check your cholesterol and your blood glucose. Continue to have your blood tested as directed by your health care provider.  What should I know about cancer screening? There are several types of cancer. Take the following steps to reduce your risk and to catch any cancer development as early as possible. Breast Cancer  Practice breast self-awareness. ? This means understanding how your breasts normally appear and feel. ? It also means doing regular breast self-exams. Let your health care provider know about any changes,  no matter how small.  If you are 73 or older, have a clinician do a breast exam (clinical breast exam or CBE) every year. Depending on your age, family history, and medical history, it may be recommended that you also have  a yearly breast X-ray (mammogram).  If you have a family history of breast cancer, talk with your health care provider about genetic screening.  If you are at high risk for breast cancer, talk with your health care provider about having an MRI and a mammogram every year.  Breast cancer (BRCA) gene test is recommended for women who have family members with BRCA-related cancers. Results of the assessment will determine the need for genetic counseling and BRCA1 and for BRCA2 testing. BRCA-related cancers include these types: ? Breast. This occurs in males or females. ? Ovarian. ? Tubal. This may also be called fallopian tube cancer. ? Cancer of the abdominal or pelvic lining (peritoneal cancer). ? Prostate. ? Pancreatic.  Cervical, Uterine, and Ovarian Cancer Your health care provider may recommend that you be screened regularly for cancer of the pelvic organs. These include your ovaries, uterus, and vagina. This screening involves a pelvic exam, which includes checking for microscopic changes to the surface of your cervix (Pap test).  For women ages 21-65, health care providers may recommend a pelvic exam and a Pap test every three years. For women ages 81-65, they may recommend the Pap test and pelvic exam, combined with testing for human papilloma virus (HPV), every five years. Some types of HPV increase your risk of cervical cancer. Testing for HPV may also be done on women of any age who have unclear Pap test results.  Other health care providers may not recommend any screening for nonpregnant women who are considered low risk for pelvic cancer and have no symptoms. Ask your health care provider if a screening pelvic exam is right for you.  If you have had past treatment for cervical cancer or a condition that could lead to cancer, you need Pap tests and screening for cancer for at least 20 years after your treatment. If Pap tests have been discontinued for you, your risk factors (such as  having a new sexual partner) need to be reassessed to determine if you should start having screenings again. Some women have medical problems that increase the chance of getting cervical cancer. In these cases, your health care provider may recommend that you have screening and Pap tests more often.  If you have a family history of uterine cancer or ovarian cancer, talk with your health care provider about genetic screening.  If you have vaginal bleeding after reaching menopause, tell your health care provider.  There are currently no reliable tests available to screen for ovarian cancer.  Lung Cancer Lung cancer screening is recommended for adults 67-64 years old who are at high risk for lung cancer because of a history of smoking. A yearly low-dose CT scan of the lungs is recommended if you:  Currently smoke.  Have a history of at least 30 pack-years of smoking and you currently smoke or have quit within the past 15 years. A pack-year is smoking an average of one pack of cigarettes per day for one year.  Yearly screening should:  Continue until it has been 15 years since you quit.  Stop if you develop a health problem that would prevent you from having lung cancer treatment.  Colorectal Cancer  This type of cancer can be detected and can often be  prevented.  Routine colorectal cancer screening usually begins at age 108 and continues through age 85.  If you have risk factors for colon cancer, your health care provider may recommend that you be screened at an earlier age.  If you have a family history of colorectal cancer, talk with your health care provider about genetic screening.  Your health care provider may also recommend using home test kits to check for hidden blood in your stool.  A small camera at the end of a tube can be used to examine your colon directly (sigmoidoscopy or colonoscopy). This is done to check for the earliest forms of colorectal cancer.  Direct  examination of the colon should be repeated every 5-10 years until age 10. However, if early forms of precancerous polyps or small growths are found or if you have a family history or genetic risk for colorectal cancer, you may need to be screened more often.  Skin Cancer  Check your skin from head to toe regularly.  Monitor any moles. Be sure to tell your health care provider: ? About any new moles or changes in moles, especially if there is a change in a mole's shape or color. ? If you have a mole that is larger than the size of a pencil eraser.  If any of your family members has a history of skin cancer, especially at a Kendry Pfarr age, talk with your health care provider about genetic screening.  Always use sunscreen. Apply sunscreen liberally and repeatedly throughout the day.  Whenever you are outside, protect yourself by wearing long sleeves, pants, a wide-brimmed hat, and sunglasses.  What should I know about osteoporosis? Osteoporosis is a condition in which bone destruction happens more quickly than new bone creation. After menopause, you may be at an increased risk for osteoporosis. To help prevent osteoporosis or the bone fractures that can happen because of osteoporosis, the following is recommended:  If you are 36-50 years old, get at least 1,000 mg of calcium and at least 600 mg of vitamin D per day.  If you are older than age 7 but younger than age 29, get at least 1,200 mg of calcium and at least 600 mg of vitamin D per day.  If you are older than age 79, get at least 1,200 mg of calcium and at least 800 mg of vitamin D per day.  Smoking and excessive alcohol intake increase the risk of osteoporosis. Eat foods that are rich in calcium and vitamin D, and do weight-bearing exercises several times each week as directed by your health care provider. What should I know about how menopause affects my mental health? Depression may occur at any age, but it is more common as you become  older. Common symptoms of depression include:  Low or sad mood.  Changes in sleep patterns.  Changes in appetite or eating patterns.  Feeling an overall lack of motivation or enjoyment of activities that you previously enjoyed.  Frequent crying spells.  Talk with your health care provider if you think that you are experiencing depression. What should I know about immunizations? It is important that you get and maintain your immunizations. These include:  Tetanus, diphtheria, and pertussis (Tdap) booster vaccine.  Influenza every year before the flu season begins.  Pneumonia vaccine.  Shingles vaccine.  Your health care provider may also recommend other immunizations. This information is not intended to replace advice given to you by your health care provider. Make sure you discuss any questions you  have with your health care provider. Document Released: 04/17/2005 Document Revised: 09/13/2015 Document Reviewed: 11/27/2014 Elsevier Interactive Patient Education  2018 [employer].

## 2017-02-23 NOTE — Progress Notes (Signed)
Amber Bowman [birth year] [id number]    History:    Presents for no complaints. Postmenopausal on no HRT with no bleeding. On disability due to epilepsy, does not drive. Stable osteopenia 2018 T score -1.6 FRAX 8.5%/0.8%. Normal Pap and mammogram history. 2014 negative colonoscopy. Rare intercourse.  Past medical history, past surgical history, family history and social history were all reviewed and documented in the EPIC chart. Mother sister hypertension and diabetes, both deceased, sister suicide.  ROS:  A ROS was performed and pertinent positives and negatives are included.  Exam:  Vitals:   02/23/17 1037  BP: 134/80  Weight: 163 lb (73.9 kg)  Height: 5\' 4"  (1.626 m)   Body mass index is 27.98 kg/m.   General appearance:  Normal Thyroid:  Symmetrical, normal in size, without palpable masses or nodularity. Respiratory  Auscultation:  Clear without wheezing or rhonchi Cardiovascular  Auscultation:  Regular rate, without rubs, murmurs or gallops  Edema/varicosities:  Not grossly evident Abdominal  Soft,nontender, without masses, guarding or rebound.  Liver/spleen:  No organomegaly noted  Hernia:  None appreciated  Skin  Inspection:  Grossly normal   Breasts: Examined lying and sitting.     Right: Without masses, retractions, discharge or axillary adenopathy.     Left: Without masses, retractions, discharge or axillary adenopathy. Gentitourinary   Inguinal/mons:  Normal without inguinal adenopathy  External genitalia:  Normal  BUS/Urethra/Skene's glands:  Normal  Vagina:  Atrophic  Cervix:  Normal  Uterus:   normal in size, shape and contour.  Midline and mobile  Adnexa/parametria:     Rt: Without masses or tenderness.   Lt: Without masses or tenderness.  Anus and perineum: Normal  Digital rectal exam: Normal sphincter tone without palpated masses or tenderness  Assessment/Plan:  61 y.o. MWF G0 for breast and pelvic exam with no complaints.  Postmenopausal/no  HRT/no bleeding/asymptomatic vaginal atrophy Osteopenia without elevated FRAX Epilepsy-neurologist manages Primary care-labs  Plan: Shingrex vaccine reviewed encouraged. SBE's, continue annual screening mammogram, calcium rich diet, vitamin D 2000 daily encouraged. Home safety, fall prevention and importance of weightbearing exercise reviewed. Pap normal 2016, new screening guidelines reviewed.    Huel Cote [facility], 11:04 AM 02/23/2017

## 2017-03-21 ENCOUNTER — Other Ambulatory Visit: Payer: Self-pay | Admitting: Neurology

## 2017-05-06 ENCOUNTER — Ambulatory Visit (INDEPENDENT_AMBULATORY_CARE_PROVIDER_SITE_OTHER): Payer: Medicare Other | Admitting: Adult Health

## 2017-05-06 ENCOUNTER — Encounter: Payer: Self-pay | Admitting: Adult Health

## 2017-05-06 VITALS — BP 125/69 | HR 73 | Ht 64.0 in | Wt 161.8 lb

## 2017-05-06 DIAGNOSIS — Z5181 Encounter for therapeutic drug level monitoring: Secondary | ICD-10-CM | POA: Diagnosis not present

## 2017-05-06 DIAGNOSIS — G40219 Localization-related (focal) (partial) symptomatic epilepsy and epileptic syndromes with complex partial seizures, intractable, without status epilepticus: Secondary | ICD-10-CM

## 2017-05-06 MED ORDER — LEVETIRACETAM 500 MG PO TABS
500.0000 mg | ORAL_TABLET | Freq: Two times a day (BID) | ORAL | 11 refills | Status: DC
Start: 2017-05-06 — End: 2018-03-07

## 2017-05-06 NOTE — Patient Instructions (Signed)
Your Plan:  Continue Keppra, Felbamate and Topamax Blood work today If your symptoms worsen or you develop new symptoms please let us know.    Thank you for coming to see [place] at [facility]. I hope we have been able to provide you high quality care today.  You may receive a patient satisfaction survey over the next few weeks. We would appreciate your feedback and comments so that we may continue to improve ourselves and the health of our patients.

## 2017-05-06 NOTE — Progress Notes (Signed)
PATIENT: Amber Bowman DOB: [birth year]  REASON FOR VISIT: follow up HISTORY FROM: patient  HISTORY OF PRESENT ILLNESS:  Today 05/06/17 Amber Bowman is a 62 year old female with a history of intractable partial complex type seizures.  She returns today for follow-up.  He is currently on keppra,Topamax and felbamate.  She states that she tolerates these medications well.  She denies any seizure events since last visit.  She lives at home with significant other she is able to complete all ADLs independently.  He does not operate a motor vehicle.  She returns today for an evaluation.   HISTORY 10/29/16 Amber Bowman is a 62 year old female with a history of intractable partial complex type seizures. She returns today for follow-up. She denies any seizure events. Denies any staring events. She remains on Topamax and felbamate. She is able to complete all ADLs independently. Reports that she does not operate a motor vehicle. She states that she walks approximately 3 miles daily. She denies any new neurological symptoms. She returns today for an evaluation.  HISTORY 04/30/16: Amber Bowman is a 62 year old right-handed white female with a history of intractable partial complex type seizures. The patient on average has about one seizure a month, the seizures are brief lasting 2 or 3 minutes and are associated with a staring event. The patient does not recall events during the seizure. She does not injure herself. The seizures or nonconvulsive. The patient has been on high-dose Topamax and felbamate without full control ofthe seizures. She has been on a number of seizure medications in the past including Zonegran. She returns for an evaluation. She has not wished to pursue evaluation for possible epilepsy surgery or consider a vagal nerve stimulator.  REVIEW OF SYSTEMS: Out of a complete 14 system review of symptoms, the patient complains only of the following symptoms, and all other reviewed systems are  negative.  See HPI  ALLERGIES: No Known Allergies  HOME MEDICATIONS: Outpatient Medications Prior to Visit  Medication Sig Dispense Refill  . aspirin 81 MG tablet Take 81 mg by mouth daily.      Marland Kitchen CRANBERRY PO Take 1 tablet by mouth daily.    . felbamate (FELBATOL) 600 MG tablet TAKE 2 TABLETS BY MOUTH 4 TIMES A DAY 240 tablet 11  . levETIRAcetam (KEPPRA) 500 MG tablet TAKE 1 TABLET BY MOUTH TWICE A DAY 60 tablet 2  . Multiple Vitamins-Minerals (ICAPS AREDS FORMULA PO) Take by mouth.    . topiramate (TOPAMAX) 200 MG tablet TAKE 1 TABLET (200 MG TOTAL) BY MOUTH 2 (TWO) TIMES DAILY. 60 tablet 10   No facility-administered medications prior to visit.     PAST MEDICAL HISTORY: Past Medical History:  Diagnosis Date  . Colon polyps 2014  . Condyloma   . Depression   . Epilepsy ([place])   . Osteopenia 08/2016   T score -1.6 FRAX 8.5%/0.8%  . Seizures ([place])     PAST SURGICAL HISTORY: Past Surgical History:  Procedure Laterality Date  . CATARACT EXTRACTION  03/2015    FAMILY HISTORY: Family History  Problem Relation Age of Onset  . Hypertension Mother   . Diabetes Mother   . Heart disease Mother   . Hypertension Father   . Heart disease Father   . Diabetes Sister   . Hypertension Sister   . Heart disease Sister   . Seizures Neg Hx     SOCIAL HISTORY: Social History   Socioeconomic History  . Marital status: Legally Separated  Spouse name: Not on file  . Number of children: 0  . Years of education: 59  . Highest education level: Not on file  Social Needs  . Financial resource strain: Not on file  . Food insecurity - worry: Not on file  . Food insecurity - inability: Not on file  . Transportation needs - medical: Not on file  . Transportation needs - non-medical: Not on file  Occupational History    Comment: Disabled.  Tobacco Use  . Smoking status: Never Smoker  . Smokeless tobacco: Never Used  Substance and Sexual Activity  . Alcohol use: No     Alcohol/week: 0.0 oz  . Drug use: No  . Sexual activity: Yes    Partners: Male    Birth control/protection: Post-menopausal  Other Topics Concern  . Not on file  Social History Narrative   Patient lives at home alone single.   Patient is disabled.   Education some college education.   Right handed.   Caffeine four cups of tea daily.      PHYSICAL EXAM  Vitals:   05/06/17 1107  BP: 125/69  Pulse: 73  Weight: 161 lb 12.8 oz (73.4 kg)  Height: 5\' 4"  (1.626 m)   Body mass index is 27.77 kg/m.  Generalized: Well developed, in no acute distress   Neurological examination  Mentation: Alert oriented to time, place, history taking. Follows all commands speech and language fluent Cranial nerve II-XII: Pupils were equal round reactive to light. Extraocular movements were full, visual field were full on confrontational test. Facial sensation and strength were normal. Uvula tongue midline. Head turning and shoulder shrug  were normal and symmetric. Motor: The motor testing reveals 5 over 5 strength of all 4 extremities. Good symmetric motor tone is noted throughout.  Sensory: Sensory testing is intact to soft touch on all 4 extremities. No evidence of extinction is noted.  Coordination: Cerebellar testing reveals good finger-nose-finger and heel-to-shin bilaterally.  Gait and station: Gait is normal. Tandem gait is normal. Romberg is negative. No drift is seen.  Reflexes: Deep tendon reflexes are symmetric and normal bilaterally.   DIAGNOSTIC DATA (LABS, IMAGING, TESTING) - I reviewed patient records, labs, notes, testing and imaging myself where available.  Lab Results  Component Value Date   WBC 6.1 10/29/2016   HGB 14.1 10/29/2016   HCT 42.7 10/29/2016   MCV 94 10/29/2016   PLT 335 10/29/2016      Component Value Date/Time   NA 141 10/29/2016 1254   K 4.5 10/29/2016 1254   CL 103 10/29/2016 1254   CO2 22 10/29/2016 1254   GLUCOSE 84 10/29/2016 1254   BUN 15 10/29/2016  1254   CREATININE 0.71 10/29/2016 1254   CALCIUM 9.8 10/29/2016 1254   PROT 7.4 10/29/2016 1254   ALBUMIN 4.4 10/29/2016 1254   AST 21 10/29/2016 1254   ALT 23 10/29/2016 1254   ALKPHOS 161 (H) 10/29/2016 1254   BILITOT <0.2 10/29/2016 1254   GFRNONAA 92 10/29/2016 1254   GFRAA 106 10/29/2016 1254     ASSESSMENT AND PLAN 62 y.o. year old female  has a past medical history of Colon polyps (2014), Condyloma, Depression, Epilepsy ([place]), Osteopenia (08/2016), and Seizures ([place]). here with:  1.  Seizures  Overall the patient is doing well.  She will continue on Keppra, felbamate and Topamax.  I will check blood work today.  She is advised that if she has any additional seizure events she should let us know.  She  will follow-up in 6 months or sooner if needed.  I spent 15 minutes with the patient. 50% of this time was spent reviewing medication  Ward Givens, MSN, NP-C 05/06/2017, 11:13 AM [facility] [address], [place], [place] [address] [phone]

## 2017-05-06 NOTE — Progress Notes (Signed)
I have read the note, and I agree with the clinical assessment and plan.  Betsaida [place] K Nandini Bogdanski   

## 2017-05-10 ENCOUNTER — Telehealth: Payer: Self-pay | Admitting: *Deleted

## 2017-05-10 LAB — CBC WITH DIFFERENTIAL/PLATELET
Basophils Absolute: 0.1 10*3/uL (ref 0.0–0.2)
Basos: 1 %
EOS (ABSOLUTE): 0.1 10*3/uL (ref 0.0–0.4)
Eos: 1 %
HEMOGLOBIN: 14.2 g/dL (ref 11.1–15.9)
Hematocrit: 43.4 % (ref 34.0–46.6)
IMMATURE GRANS (ABS): 0 10*3/uL (ref 0.0–0.1)
IMMATURE GRANULOCYTES: 0 %
LYMPHS: 34 %
Lymphocytes Absolute: 1.9 10*3/uL (ref 0.7–3.1)
MCH: 32.1 pg (ref 26.6–33.0)
MCHC: 32.7 g/dL (ref 31.5–35.7)
MCV: 98 fL — ABNORMAL HIGH (ref 79–97)
MONOCYTES: 12 %
Monocytes Absolute: 0.7 10*3/uL (ref 0.1–0.9)
NEUTROS ABS: 3 10*3/uL (ref 1.4–7.0)
NEUTROS PCT: 52 %
PLATELETS: 309 10*3/uL (ref 150–379)
RBC: 4.43 x10E6/uL (ref 3.77–5.28)
RDW: 13.8 % (ref 12.3–15.4)
WBC: 5.7 10*3/uL (ref 3.4–10.8)

## 2017-05-10 LAB — COMPREHENSIVE METABOLIC PANEL
A/G RATIO: 1.5 (ref 1.2–2.2)
ALT: 21 IU/L (ref 0–32)
AST: 20 IU/L (ref 0–40)
Albumin: 4.4 g/dL (ref 3.6–4.8)
Alkaline Phosphatase: 163 IU/L — ABNORMAL HIGH (ref 39–117)
BUN/Creatinine Ratio: 19 (ref 12–28)
BUN: 13 mg/dL (ref 8–27)
CALCIUM: 9.6 mg/dL (ref 8.7–10.3)
CHLORIDE: 106 mmol/L (ref 96–106)
CO2: 21 mmol/L (ref 20–29)
Creatinine, Ser: 0.7 mg/dL (ref 0.57–1.00)
GFR, EST AFRICAN AMERICAN: 107 mL/min/{1.73_m2} (ref >59)
GFR, EST NON AFRICAN AMERICAN: 93 mL/min/{1.73_m2} (ref >59)
GLUCOSE: 82 mg/dL (ref 65–99)
Globulin, Total: 3 g/dL (ref 1.5–4.5)
Potassium: 4.3 mmol/L (ref 3.5–5.2)
Sodium: 143 mmol/L (ref 134–144)
TOTAL PROTEIN: 7.4 g/dL (ref 6.0–8.5)

## 2017-05-10 LAB — TOPIRAMATE LEVEL: TOPIRAMATE LVL: 6.9 ug/mL (ref 2.0–25.0)

## 2017-05-10 LAB — FELBAMATE LEVEL: Felbamate Lvl: 54 ug/mL (ref 40–100)

## 2017-05-10 NOTE — Telephone Encounter (Signed)
LVM informing patient her blood work is relatively unremarkable. Advised her one of the liver enzymes, Alkaline phosphatase is slightly elevated but consistent with previous blood work. advised her the NP will continue to monitor it. Left number for any questions.

## 2017-09-17 ENCOUNTER — Other Ambulatory Visit: Payer: Self-pay | Admitting: Women's Health

## 2017-09-17 DIAGNOSIS — Z1231 Encounter for screening mammogram for malignant neoplasm of breast: Secondary | ICD-10-CM

## 2017-10-03 ENCOUNTER — Other Ambulatory Visit: Payer: Self-pay | Admitting: Neurology

## 2017-10-08 ENCOUNTER — Ambulatory Visit
Admission: RE | Admit: 2017-10-08 | Discharge: 2017-10-08 | Disposition: A | Discharge status: Home / Self Care | Payer: Medicare Other | Source: Ambulatory Visit | Attending: Women's Health | Admitting: Women's Health

## 2017-10-08 DIAGNOSIS — Z1231 Encounter for screening mammogram for malignant neoplasm of breast: Secondary | ICD-10-CM | POA: Diagnosis not present

## 2017-11-09 ENCOUNTER — Ambulatory Visit (INDEPENDENT_AMBULATORY_CARE_PROVIDER_SITE_OTHER): Payer: Medicare Other | Admitting: Adult Health

## 2017-11-09 ENCOUNTER — Encounter: Payer: Self-pay | Admitting: Adult Health

## 2017-11-09 VITALS — BP 115/72 | HR 76 | Ht 64.0 in | Wt 164.0 lb

## 2017-11-09 DIAGNOSIS — Z5181 Encounter for therapeutic drug level monitoring: Secondary | ICD-10-CM | POA: Diagnosis not present

## 2017-11-09 DIAGNOSIS — R569 Unspecified convulsions: Secondary | ICD-10-CM

## 2017-11-09 NOTE — Progress Notes (Signed)
PATIENT: Amber Bowman DOB: 28-Oct-1955  REASON FOR VISIT: follow up HISTORY FROM: patient  HISTORY OF PRESENT ILLNESS: Today 11/09/17:  Amber Bowman is a 62 year old female with a history of intractable partial complex type seizures.  He returns today for follow-up.  She is currently on Topamax, Keppra and felbamate.  She denies any seizure events.  Denies any changes in her mood or behavior.  No change in her gait or balance.  She does not operate a motor vehicle.  She states that she tries to walk every morning.  She returns today for evaluation.  HISTORY 05/06/17 Amber Bowman is a 62 year old female with a history of intractable partial complex type seizures.  She returns today for follow-up.  He is currently on keppra,Topamax and felbamate.  She states that she tolerates these medications well.  She denies any seizure events since last visit.  She lives at home with significant other she is able to complete all ADLs independently.  He does not operate a motor vehicle.  She returns today for an evaluation.  REVIEW OF SYSTEMS: Out of a complete 14 system review of symptoms, the patient complains only of the following symptoms, and all other reviewed systems are negative.  Constipation, double vision  ALLERGIES: No Known Allergies  HOME MEDICATIONS: Outpatient Medications Prior to Visit  Medication Sig Dispense Refill  . aspirin 81 MG tablet Take 81 mg by mouth daily.      Marland Kitchen CRANBERRY PO Take 2 tablets by mouth daily.     . felbamate (FELBATOL) 600 MG tablet TAKE 2 TABLETS BY MOUTH 4 TIMES A DAY 240 tablet 11  . levETIRAcetam (KEPPRA) 500 MG tablet Take 1 tablet (500 mg total) by mouth 2 (two) times daily. 60 tablet 11  . Multiple Vitamins-Minerals (ICAPS AREDS FORMULA PO) Take by mouth.    . topiramate (TOPAMAX) 200 MG tablet TAKE 1 TABLET (200 MG TOTAL) BY MOUTH 2 (TWO) TIMES DAILY. 60 tablet 10   No facility-administered medications prior to visit.     PAST MEDICAL  HISTORY: Past Medical History:  Diagnosis Date  . Colon polyps 2014  . Condyloma   . Depression   . Epilepsy ([place])   . Osteopenia 08/2016   T score -1.6 FRAX 8.5%/0.8%  . Seizures ([place])     PAST SURGICAL HISTORY: Past Surgical History:  Procedure Laterality Date  . CATARACT EXTRACTION  03/2015    FAMILY HISTORY: Family History  Problem Relation Age of Onset  . Hypertension Mother   . Diabetes Mother   . Heart disease Mother   . Hypertension Father   . Heart disease Father   . Diabetes Sister   . Hypertension Sister   . Heart disease Sister   . Seizures Neg Hx     SOCIAL HISTORY: Social History   Socioeconomic History  . Marital status: Legally Separated    Spouse name: Not on file  . Number of children: 0  . Years of education: 17  . Highest education level: Not on file  Occupational History    Comment: Disabled.  Social Needs  . Financial resource strain: Not on file  . Food insecurity:    Worry: Not on file    Inability: Not on file  . Transportation needs:    Medical: Not on file    Non-medical: Not on file  Tobacco Use  . Smoking status: Never Smoker  . Smokeless tobacco: Never Used  Substance and Sexual Activity  . Alcohol use: No  Alcohol/week: 0.0 standard drinks  . Drug use: No  . Sexual activity: Yes    Partners: Male    Birth control/protection: Post-menopausal  Lifestyle  . Physical activity:    Days per week: Not on file    Minutes per session: Not on file  . Stress: Not on file  Relationships  . Social connections:    Talks on phone: Not on file    Gets together: Not on file    Attends religious service: Not on file    Active member of club or organization: Not on file    Attends meetings of clubs or organizations: Not on file    Relationship status: Not on file  . Intimate partner violence:    Fear of current or ex partner: Not on file    Emotionally abused: Not on file    Physically abused: Not on file    Forced sexual  activity: Not on file  Other Topics Concern  . Not on file  Social History Narrative   Patient lives at home alone single.   Patient is disabled.   Education some college education.   Right handed.   Caffeine four cups of tea daily.      PHYSICAL EXAM  Vitals:   11/09/17 1000  BP: 115/72  Pulse: 76  Weight: 164 lb (74.4 kg)  Height: 5\' 4"  (1.626 m)   Body mass index is 28.15 kg/m.  Generalized: Well developed, in no acute distress   Neurological examination  Mentation: Alert oriented to time, place, history taking. Follows all commands speech and language fluent Cranial nerve II-XII: Pupils were equal round reactive to light. Extraocular movements were full, visual field were full on confrontational test. Facial sensation and strength were normal. Uvula tongue midline. Head turning and shoulder shrug  were normal and symmetric. Motor: The motor testing reveals 5 over 5 strength of all 4 extremities. Good symmetric motor tone is noted throughout.  Sensory: Sensory testing is intact to soft touch on all 4 extremities. No evidence of extinction is noted.  Coordination: Cerebellar testing reveals good finger-nose-finger and heel-to-shin bilaterally.  Gait and station: Gait is normal. Tandem gait is normal. Romberg is negative. No drift is seen.  Reflexes: Deep tendon reflexes are symmetric and normal bilaterally.   DIAGNOSTIC DATA (LABS, IMAGING, TESTING) - I reviewed patient records, labs, notes, testing and imaging myself where available.  Lab Results  Component Value Date   WBC 5.7 05/06/2017   HGB 14.2 05/06/2017   HCT 43.4 05/06/2017   MCV 98 (H) 05/06/2017   PLT 309 05/06/2017      Component Value Date/Time   NA 143 05/06/2017 1133   K 4.3 05/06/2017 1133   CL 106 05/06/2017 1133   CO2 21 05/06/2017 1133   GLUCOSE 82 05/06/2017 1133   BUN 13 05/06/2017 1133   CREATININE 0.70 05/06/2017 1133   CALCIUM 9.6 05/06/2017 1133   PROT 7.4 05/06/2017 1133   ALBUMIN  4.4 05/06/2017 1133   AST 20 05/06/2017 1133   ALT 21 05/06/2017 1133   ALKPHOS 163 (H) 05/06/2017 1133   BILITOT <0.2 05/06/2017 1133   GFRNONAA 93 05/06/2017 1133   GFRAA 107 05/06/2017 1133   No results found for: CHOL, HDL, LDLCALC, LDLDIRECT, TRIG, CHOLHDL No results found for: HGBA1C No results found for: VITAMINB12 No results found for: TSH    ASSESSMENT AND PLAN 62 y.o. year old female  has a past medical history of Colon polyps (2014), Condyloma, Depression, Epilepsy (  [place]), Osteopenia (08/2016), and Seizures ([place]). here with:  1.  Seizures  Overall the patient is doing well.  She will continue on Keppra, felbamate and Topamax.  I will check blood work today.  If she has any additional seizure events she should let us know.  She will follow-up in 1 year or sooner if needed.   I spent 15 minutes with the patient. 50% of this time was spent reviewing her plan of care  Ward Givens, MSN, NP-C 11/09/2017, 10:25 AM [facility] [address] [place], [place] [address] [phone]

## 2017-11-09 NOTE — Progress Notes (Signed)
I have read the note, and I agree with the clinical assessment and plan.  Amber Bowman   

## 2017-11-09 NOTE — Patient Instructions (Signed)
Your Plan:  Continue Keppra, topamax, felbamate Blood work today If you have any seizure events please let us know.  Thank you for coming to see [place] at [facility]. I hope we have been able to provide you high quality care today.  You may receive a patient satisfaction survey over the next few weeks. We would appreciate your feedback and comments so that we may continue to improve ourselves and the health of our patients.

## 2017-11-11 LAB — CBC WITH DIFFERENTIAL/PLATELET
BASOS ABS: 0.1 10*3/uL (ref 0.0–0.2)
Basos: 1 %
EOS (ABSOLUTE): 0.1 10*3/uL (ref 0.0–0.4)
Eos: 1 %
HEMOGLOBIN: 13.4 g/dL (ref 11.1–15.9)
Hematocrit: 41.7 % (ref 34.0–46.6)
Immature Grans (Abs): 0 10*3/uL (ref 0.0–0.1)
Immature Granulocytes: 0 %
LYMPHS ABS: 2.4 10*3/uL (ref 0.7–3.1)
Lymphs: 32 %
MCH: 29.6 pg (ref 26.6–33.0)
MCHC: 32.1 g/dL (ref 31.5–35.7)
MCV: 92 fL (ref 79–97)
MONOCYTES: 12 %
MONOS ABS: 0.9 10*3/uL (ref 0.1–0.9)
NEUTROS PCT: 54 %
Neutrophils Absolute: 4.2 10*3/uL (ref 1.4–7.0)
Platelets: 329 10*3/uL (ref 150–450)
RBC: 4.53 x10E6/uL (ref 3.77–5.28)
RDW: 12.6 % (ref 12.3–15.4)
WBC: 7.7 10*3/uL (ref 3.4–10.8)

## 2017-11-11 LAB — COMPREHENSIVE METABOLIC PANEL
ALBUMIN: 4.2 g/dL (ref 3.6–4.8)
ALT: 13 IU/L (ref 0–32)
AST: 15 IU/L (ref 0–40)
Albumin/Globulin Ratio: 1.5 (ref 1.2–2.2)
Alkaline Phosphatase: 160 IU/L — ABNORMAL HIGH (ref 39–117)
BUN/Creatinine Ratio: 18 (ref 12–28)
BUN: 14 mg/dL (ref 8–27)
Bilirubin Total: <0.2 mg/dL (ref 0.0–1.2)
CALCIUM: 9.8 mg/dL (ref 8.7–10.3)
CO2: 20 mmol/L (ref 20–29)
CREATININE: 0.76 mg/dL (ref 0.57–1.00)
Chloride: 108 mmol/L — ABNORMAL HIGH (ref 96–106)
GFR calc Af Amer: 97 mL/min/{1.73_m2} (ref >59)
GFR, EST NON AFRICAN AMERICAN: 84 mL/min/{1.73_m2} (ref >59)
GLOBULIN, TOTAL: 2.8 g/dL (ref 1.5–4.5)
Glucose: 77 mg/dL (ref 65–99)
Potassium: 4.3 mmol/L (ref 3.5–5.2)
SODIUM: 145 mmol/L — AB (ref 134–144)
Total Protein: 7 g/dL (ref 6.0–8.5)

## 2017-11-11 LAB — FELBAMATE LEVEL: Felbamate Lvl: 171 ug/mL — ABNORMAL HIGH (ref 40–100)

## 2017-11-11 LAB — TOPIRAMATE LEVEL: TOPIRAMATE LVL: 14.6 ug/mL (ref 2.0–25.0)

## 2017-11-15 ENCOUNTER — Other Ambulatory Visit: Payer: Self-pay | Admitting: Adult Health

## 2017-11-15 DIAGNOSIS — Z5181 Encounter for therapeutic drug level monitoring: Secondary | ICD-10-CM

## 2017-11-16 ENCOUNTER — Telehealth: Payer: Self-pay | Admitting: *Deleted

## 2017-11-16 NOTE — Telephone Encounter (Signed)
LVM informing patient that her felbamate level is high. She will need to come in for repeat blood work. Advised the lab order is in, so she will come in during regular office hours to have drawn again. Gave lab hours and phone for any questions.

## 2017-11-17 ENCOUNTER — Other Ambulatory Visit (INDEPENDENT_AMBULATORY_CARE_PROVIDER_SITE_OTHER): Payer: Self-pay

## 2017-11-17 DIAGNOSIS — Z5181 Encounter for therapeutic drug level monitoring: Secondary | ICD-10-CM

## 2017-11-17 DIAGNOSIS — Z0289 Encounter for other administrative examinations: Secondary | ICD-10-CM

## 2017-11-19 LAB — FELBAMATE LEVEL: FELBAMATE LVL: 212 ug/mL — AB (ref 40–100)

## 2017-11-23 ENCOUNTER — Telehealth: Payer: Self-pay | Admitting: *Deleted

## 2017-11-23 NOTE — Telephone Encounter (Signed)
LVM informing patient that her Felbamate level is elevated, however she shows no signs of toxicity. Also advised her seizures are under good control. Advised her that NP discussed with Dr. Jannifer Franklin. We will continue to monitor for now. Advised her there is no change in her medication.  Advised she call for any questions problems or concerns. Left office number.

## 2017-11-25 ENCOUNTER — Other Ambulatory Visit: Payer: Self-pay | Admitting: Neurology

## 2017-11-29 DIAGNOSIS — Z23 Encounter for immunization: Secondary | ICD-10-CM | POA: Diagnosis not present

## 2018-01-14 ENCOUNTER — Telehealth: Payer: Self-pay | Admitting: *Deleted

## 2018-01-14 DIAGNOSIS — M654 Radial styloid tenosynovitis [de Quervain]: Secondary | ICD-10-CM | POA: Diagnosis not present

## 2018-01-14 DIAGNOSIS — G40909 Epilepsy, unspecified, not intractable, without status epilepticus: Secondary | ICD-10-CM | POA: Diagnosis not present

## 2018-01-14 NOTE — Telephone Encounter (Signed)
Patient called c/o thumb discomfort, asked what to do, no PCP, I suggested she follow up at Urgent Care as this is GYN office and urgent care will be able to help her with this.

## 2018-02-28 ENCOUNTER — Encounter: Payer: Medicare Other | Admitting: Women's Health

## 2018-03-07 ENCOUNTER — Ambulatory Visit (INDEPENDENT_AMBULATORY_CARE_PROVIDER_SITE_OTHER): Payer: Medicare Other | Admitting: Women's Health

## 2018-03-07 ENCOUNTER — Encounter: Payer: Self-pay | Admitting: Women's Health

## 2018-03-07 VITALS — BP 142/80 | Ht 64.0 in | Wt 164.0 lb

## 2018-03-07 DIAGNOSIS — Z01419 Encounter for gynecological examination (general) (routine) without abnormal findings: Secondary | ICD-10-CM

## 2018-03-07 DIAGNOSIS — M810 Age-related osteoporosis without current pathological fracture: Secondary | ICD-10-CM | POA: Diagnosis not present

## 2018-03-07 DIAGNOSIS — Z01411 Encounter for gynecological examination (general) (routine) with abnormal findings: Secondary | ICD-10-CM

## 2018-03-07 DIAGNOSIS — E2839 Other primary ovarian failure: Secondary | ICD-10-CM

## 2018-03-07 DIAGNOSIS — M8580 Other specified disorders of bone density and structure, unspecified site: Secondary | ICD-10-CM

## 2018-03-07 DIAGNOSIS — M79642 Pain in left hand: Secondary | ICD-10-CM | POA: Diagnosis not present

## 2018-03-07 NOTE — Patient Instructions (Signed)
Health Maintenance for Postmenopausal Women Menopause is a normal process in which your reproductive ability comes to an end. This process happens gradually over a span of months to years, usually between the ages of 62 and 89. Menopause is complete when you have missed 12 consecutive menstrual periods. It is important to talk with your health care provider about some of the most common conditions that affect postmenopausal women, such as heart disease, cancer, and bone loss (osteoporosis). Adopting a healthy lifestyle and getting preventive care can help to promote your health and wellness. Those actions can also lower your chances of developing some of these common conditions. What should I know about menopause? During menopause, you may experience a number of symptoms, such as:  Moderate-to-severe hot flashes.  Night sweats.  Decrease in sex drive.  Mood swings.  Headaches.  Tiredness.  Irritability.  Memory problems.  Insomnia. Choosing to treat or not to treat menopausal changes is an individual decision that you make with your health care provider. What should I know about hormone replacement therapy and supplements? Hormone therapy products are effective for treating symptoms that are associated with menopause, such as hot flashes and night sweats. Hormone replacement carries certain risks, especially as you become older. If you are thinking about using estrogen or estrogen with progestin treatments, discuss the benefits and risks with your health care provider. What should I know about heart disease and stroke? Heart disease, heart attack, and stroke become more likely as you age. This may be due, in part, to the hormonal changes that your body experiences during menopause. These can affect how your body processes dietary fats, triglycerides, and cholesterol. Heart attack and stroke are both medical emergencies. There are many things that you can do to help prevent heart disease  and stroke:  Have your blood pressure checked at least every 1-2 years. High blood pressure causes heart disease and increases the risk of stroke.  If you are 79-72 years old, ask your health care provider if you should take aspirin to prevent a heart attack or a stroke.  Do not use any tobacco products, including cigarettes, chewing tobacco, or electronic cigarettes. If you need help quitting, ask your health care provider.  It is important to eat a healthy diet and maintain a healthy weight. ? Be sure to include plenty of vegetables, fruits, low-fat dairy products, and lean protein. ? Avoid eating foods that are high in solid fats, added sugars, or salt (sodium).  Get regular exercise. This is one of the most important things that you can do for your health. ? Try to exercise for at least 150 minutes each week. The type of exercise that you do should increase your heart rate and make you sweat. This is known as moderate-intensity exercise. ? Try to do strengthening exercises at least twice each week. Do these in addition to the moderate-intensity exercise.  Know your numbers.Ask your health care provider to check your cholesterol and your blood glucose. Continue to have your blood tested as directed by your health care provider.  What should I know about cancer screening? There are several types of cancer. Take the following steps to reduce your risk and to catch any cancer development as early as possible. Breast Cancer  Practice breast self-awareness. ? This means understanding how your breasts normally appear and feel. ? It also means doing regular breast self-exams. Let your health care provider know about any changes, no matter how small.  If you are 40 or  older, have a clinician do a breast exam (clinical breast exam or CBE) every year. Depending on your age, family history, and medical history, it may be recommended that you also have a yearly breast X-ray (mammogram).  If you  have a family history of breast cancer, talk with your health care provider about genetic screening.  If you are at high risk for breast cancer, talk with your health care provider about having an MRI and a mammogram every year.  Breast cancer (BRCA) gene test is recommended for women who have family members with BRCA-related cancers. Results of the assessment will determine the need for genetic counseling and BRCA1 and for BRCA2 testing. BRCA-related cancers include these types: ? Breast. This occurs in males or females. ? Ovarian. ? Tubal. This may also be called fallopian tube cancer. ? Cancer of the abdominal or pelvic lining (peritoneal cancer). ? Prostate. ? Pancreatic. Cervical, Uterine, and Ovarian Cancer Your health care provider may recommend that you be screened regularly for cancer of the pelvic organs. These include your ovaries, uterus, and vagina. This screening involves a pelvic exam, which includes checking for microscopic changes to the surface of your cervix (Pap test).  For women ages 21-65, health care providers may recommend a pelvic exam and a Pap test every three years. For women ages 39-65, they may recommend the Pap test and pelvic exam, combined with testing for human papilloma virus (HPV), every five years. Some types of HPV increase your risk of cervical cancer. Testing for HPV may also be done on women of any age who have unclear Pap test results.  Other health care providers may not recommend any screening for nonpregnant women who are considered low risk for pelvic cancer and have no symptoms. Ask your health care provider if a screening pelvic exam is right for you.  If you have had past treatment for cervical cancer or a condition that could lead to cancer, you need Pap tests and screening for cancer for at least 20 years after your treatment. If Pap tests have been discontinued for you, your risk factors (such as having a new sexual partner) need to be reassessed  to determine if you should start having screenings again. Some women have medical problems that increase the chance of getting cervical cancer. In these cases, your health care provider may recommend that you have screening and Pap tests more often.  If you have a family history of uterine cancer or ovarian cancer, talk with your health care provider about genetic screening.  If you have vaginal bleeding after reaching menopause, tell your health care provider.  There are currently no reliable tests available to screen for ovarian cancer. Lung Cancer Lung cancer screening is recommended for adults 57-50 years old who are at high risk for lung cancer because of a history of smoking. A yearly low-dose CT scan of the lungs is recommended if you:  Currently smoke.  Have a history of at least 30 pack-years of smoking and you currently smoke or have quit within the past 15 years. A pack-year is smoking an average of one pack of cigarettes per day for one year. Yearly screening should:  Continue until it has been 15 years since you quit.  Stop if you develop a health problem that would prevent you from having lung cancer treatment. Colorectal Cancer  This type of cancer can be detected and can often be prevented.  Routine colorectal cancer screening usually begins at age 12 and continues through  age 75.  If you have risk factors for colon cancer, your health care provider may recommend that you be screened at an earlier age.  If you have a family history of colorectal cancer, talk with your health care provider about genetic screening.  Your health care provider may also recommend using home test kits to check for hidden blood in your stool.  A small camera at the end of a tube can be used to examine your colon directly (sigmoidoscopy or colonoscopy). This is done to check for the earliest forms of colorectal cancer.  Direct examination of the colon should be repeated every 5-10 years until  age 75. However, if early forms of precancerous polyps or small growths are found or if you have a family history or genetic risk for colorectal cancer, you may need to be screened more often. Skin Cancer  Check your skin from head to toe regularly.  Monitor any moles. Be sure to tell your health care provider: ? About any new moles or changes in moles, especially if there is a change in a mole's shape or color. ? If you have a mole that is larger than the size of a pencil eraser.  If any of your family members has a history of skin cancer, especially at a young age, talk with your health care provider about genetic screening.  Always use sunscreen. Apply sunscreen liberally and repeatedly throughout the day.  Whenever you are outside, protect yourself by wearing long sleeves, pants, a wide-brimmed hat, and sunglasses. What should I know about osteoporosis? Osteoporosis is a condition in which bone destruction happens more quickly than new bone creation. After menopause, you may be at an increased risk for osteoporosis. To help prevent osteoporosis or the bone fractures that can happen because of osteoporosis, the following is recommended:  If you are 19-50 years old, get at least 1,000 mg of calcium and at least 600 mg of vitamin D per day.  If you are older than age 50 but younger than age 70, get at least 1,200 mg of calcium and at least 600 mg of vitamin D per day.  If you are older than age 70, get at least 1,200 mg of calcium and at least 800 mg of vitamin D per day. Smoking and excessive alcohol intake increase the risk of osteoporosis. Eat foods that are rich in calcium and vitamin D, and do weight-bearing exercises several times each week as directed by your health care provider. What should I know about how menopause affects my mental health? Depression may occur at any age, but it is more common as you become older. Common symptoms of depression include:  Low or sad  mood.  Changes in sleep patterns.  Changes in appetite or eating patterns.  Feeling an overall lack of motivation or enjoyment of activities that you previously enjoyed.  Frequent crying spells. Talk with your health care provider if you think that you are experiencing depression. What should I know about immunizations? It is important that you get and maintain your immunizations. These include:  Tetanus, diphtheria, and pertussis (Tdap) booster vaccine.  Influenza every year before the flu season begins.  Pneumonia vaccine.  Shingles vaccine. Your health care provider may also recommend other immunizations. This information is not intended to replace advice given to you by your health care provider. Make sure you discuss any questions you have with your health care provider. Document Released: 04/17/2005 Document Revised: 09/13/2015 Document Reviewed: 11/27/2014 Elsevier Interactive Patient Education    2019 [place].

## 2018-03-07 NOTE — Progress Notes (Signed)
Amber Bowman January 19, 1956 [id number]    History: 62 y.o MWF, G0 presents for breast and pelvic exam.  Postmenopausal on no HRT with no bleeding. Does not drive due to epilepsy. Stable osteopenia 2018 T score -1.6 FRAX 8.5%/0.8%. Normal Pap and mammogram history. 2014 negative colonoscopy. Tendonitis of left hand, 2019. Sexually active.  Had  Shingrex.  Past medical history, past surgical history, family history and social history were all reviewed and documented in the EPIC chart. Husband had back surgery in March. Walks daily 2 to 3 miles. Enjoys working in her yard and gardening. Mother sister hypertension and diabetes, both deceased, sister suicide.  ROS:  A ROS was performed and pertinent positives and negatives are included.  Exam:  Vitals:   03/07/18 0942  BP: (!) 142/80  Weight: 164 lb (74.4 kg)  Height: 5\' 4"  (1.626 m)   Body mass index is 28.15 kg/m.   General appearance:  Normal Thyroid:  Symmetrical, normal in size, without palpable masses or nodularity. Respiratory  Auscultation:  Clear without wheezing or rhonchi Cardiovascular  Auscultation:  Regular rate, without rubs, murmurs or gallops  Edema/varicosities:  Not grossly evident Abdominal  Soft,nontender, without masses, guarding or rebound.  Liver/spleen:  No organomegaly noted  Hernia:  None appreciated  Skin  Inspection:  Grossly normal   Breasts: Examined lying and sitting.     Right: Without masses, retractions, discharge or axillary adenopathy.     Left: Without masses, retractions, discharge or axillary adenopathy. Gentitourinary   Inguinal/mons:  Normal without inguinal adenopathy  External genitalia:  Normal  BUS/Urethra/Skene's glands:  Normal  Vagina: Mild atrophy  Cervix:  Normal  Uterus:  normal in size, shape and contour.  Midline and mobile  Adnexa/parametria:     Rt: Without masses or tenderness.   Lt: Without masses or tenderness.  Anus and perineum: Normal    Assessment:  62 y.o.  MWF G0 for breast and pelvic exam presents with mild pain on left hand.  Postmenopausal/no HRT/no bleeding/mild vaginal dryness Osteopenia without elevated FRAX Epilepsy-neurologist manages Primary care-labs Left hand - Tendonitis- primary care manages Overweight  Plan:  Discussed home safety, fall prevention, and exercise regimen. Encouraged patient to continue taking Vitamin D 2000 daily and calcium rich low carb diet, SBE's, continue annual screening and mammogram. Scheduled bone density exam, 2020. Discussed using warm/cold compresses to decrease inflammation of left hand , encouraged using hand brace. Pap normal 2016, new screening guidelines reviewed.  Reviewed blood pressure slightly elevated follow-up with primary care if continues greater than 130/80.  [place], 10:18 AM 03/07/2018

## 2018-04-14 ENCOUNTER — Encounter: Payer: Self-pay | Admitting: Gynecology

## 2018-04-14 ENCOUNTER — Other Ambulatory Visit: Payer: Self-pay | Admitting: Women's Health

## 2018-04-14 ENCOUNTER — Other Ambulatory Visit: Payer: Self-pay | Admitting: Gynecology

## 2018-04-14 ENCOUNTER — Ambulatory Visit (INDEPENDENT_AMBULATORY_CARE_PROVIDER_SITE_OTHER): Payer: Medicare Other

## 2018-04-14 DIAGNOSIS — M8589 Other specified disorders of bone density and structure, multiple sites: Secondary | ICD-10-CM

## 2018-04-14 DIAGNOSIS — E2839 Other primary ovarian failure: Secondary | ICD-10-CM

## 2018-04-14 DIAGNOSIS — Z780 Asymptomatic menopausal state: Secondary | ICD-10-CM

## 2018-05-15 ENCOUNTER — Other Ambulatory Visit: Payer: Self-pay | Admitting: Adult Health

## 2018-08-31 ENCOUNTER — Other Ambulatory Visit: Payer: Self-pay | Admitting: Women's Health

## 2018-08-31 DIAGNOSIS — Z1231 Encounter for screening mammogram for malignant neoplasm of breast: Secondary | ICD-10-CM

## 2018-09-01 ENCOUNTER — Other Ambulatory Visit: Payer: Self-pay | Admitting: Adult Health

## 2018-09-29 ENCOUNTER — Other Ambulatory Visit: Payer: Self-pay | Admitting: Neurology

## 2018-10-12 ENCOUNTER — Ambulatory Visit
Admission: RE | Admit: 2018-10-12 | Discharge: 2018-10-12 | Disposition: A | Discharge status: Home / Self Care | Payer: Medicare Other | Source: Ambulatory Visit | Attending: Women's Health | Admitting: Women's Health

## 2018-10-12 ENCOUNTER — Other Ambulatory Visit: Payer: Self-pay

## 2018-10-12 DIAGNOSIS — Z1231 Encounter for screening mammogram for malignant neoplasm of breast: Secondary | ICD-10-CM | POA: Diagnosis not present

## 2018-10-22 ENCOUNTER — Other Ambulatory Visit: Payer: Self-pay | Admitting: Adult Health

## 2018-11-15 ENCOUNTER — Encounter: Payer: Self-pay | Admitting: Adult Health

## 2018-11-15 ENCOUNTER — Other Ambulatory Visit: Payer: Self-pay

## 2018-11-15 ENCOUNTER — Ambulatory Visit (INDEPENDENT_AMBULATORY_CARE_PROVIDER_SITE_OTHER): Payer: Medicare Other | Admitting: Adult Health

## 2018-11-15 VITALS — BP 113/69 | HR 64 | Temp 97.8°F | Ht 64.0 in | Wt 166.2 lb

## 2018-11-15 DIAGNOSIS — Z5181 Encounter for therapeutic drug level monitoring: Secondary | ICD-10-CM

## 2018-11-15 NOTE — Progress Notes (Signed)
I have read the note, and I agree with the clinical assessment and plan.  Amber Bowman   

## 2018-11-15 NOTE — Progress Notes (Signed)
PATIENT: Amber Bowman DOB: 27-Apr-1955  REASON FOR VISIT: follow up HISTORY FROM: patient  HISTORY OF PRESENT ILLNESS: Today 11/15/18:  Amber Bowman is a 63 year old female with a history of intractable partial complex type seizures.  She returns today for follow-up.  She remains on Topamax, Keppra and felbamate.  Patient reports that she was out in her yard a week or so ago and got bitten by fire ants.  She states that she was laying in bed that night and her heart started racing.  She feels that this may be related to the fire ants but she is unsure if she had a seizure event.  At some point EMS was called but they felt that her symptoms was related to being bitten by fire ants and was instructed to use hydrocortisone cream or go get an injection.  She does not operate a motor vehicle.  She denies any changes with her gait or balance.  She is able to complete all ADLs independently.  She denies any new issues.  She returns today for evaluation.  HISTORY 11/09/17:  Amber Bowman is a 63 year old female with a history of intractable partial complex type seizures.  He returns today for follow-up.  She is currently on Topamax, Keppra and felbamate.  She denies any seizure events.  Denies any changes in her mood or behavior.  No change in her gait or balance.  She does not operate a motor vehicle.  She states that she tries to walk every morning.  She returns today for evaluation  REVIEW OF SYSTEMS: Out of a complete 14 system review of symptoms, the patient complains only of the following symptoms, and all other reviewed systems are negative.  See HPI  ALLERGIES: No Known Allergies  HOME MEDICATIONS: Outpatient Medications Prior to Visit  Medication Sig Dispense Refill  . aspirin 81 MG tablet Take 81 mg by mouth daily.      Marland Kitchen CRANBERRY PO Take 2 tablets by mouth daily.     . felbamate (FELBATOL) 600 MG tablet TAKE 2 TABLETS BY MOUTH 4 TIMES A DAY 240 tablet 1  . levETIRAcetam (KEPPRA)  500 MG tablet TAKE 1 TABLET BY MOUTH TWICE A DAY 60 tablet 11  . Multiple Vitamins-Minerals (ICAPS AREDS FORMULA PO) Take by mouth.    . topiramate (TOPAMAX) 200 MG tablet TAKE 1 TABLET BY MOUTH TWICE A DAY 60 tablet 10   No facility-administered medications prior to visit.     PAST MEDICAL HISTORY: Past Medical History:  Diagnosis Date  . Colon polyps 2014  . Condyloma   . Depression   . Epilepsy ([place])   . Osteopenia 04/2018   T score -1.8 FRAX 9.5% / 1.1% overall stable from prior DEXA  . Seizures ([place])     PAST SURGICAL HISTORY: Past Surgical History:  Procedure Laterality Date  . CATARACT EXTRACTION  03/2015    FAMILY HISTORY: Family History  Problem Relation Age of Onset  . Hypertension Mother   . Diabetes Mother   . Heart disease Mother   . Hypertension Father   . Heart disease Father   . Diabetes Sister   . Hypertension Sister   . Heart disease Sister   . Seizures Neg Hx     SOCIAL HISTORY: Social History   Socioeconomic History  . Marital status: Legally Separated    Spouse name: Not on file  . Number of children: 0  . Years of education: 48  . Highest education level: Not on  file  Occupational History    Comment: Disabled.  Social Needs  . Financial resource strain: Not on file  . Food insecurity    Worry: Not on file    Inability: Not on file  . Transportation needs    Medical: Not on file    Non-medical: Not on file  Tobacco Use  . Smoking status: Never Smoker  . Smokeless tobacco: Never Used  Substance and Sexual Activity  . Alcohol use: No    Alcohol/week: 0.0 standard drinks  . Drug use: No  . Sexual activity: Yes    Partners: Male    Birth control/protection: Post-menopausal    Comment: DECLINED INSURANCE QUESTIONS  Lifestyle  . Physical activity    Days per week: Not on file    Minutes per session: Not on file  . Stress: Not on file  Relationships  . Social Herbalist on phone: Not on file    Gets together: Not on  file    Attends religious service: Not on file    Active member of club or organization: Not on file    Attends meetings of clubs or organizations: Not on file    Relationship status: Not on file  . Intimate partner violence    Fear of current or ex partner: Not on file    Emotionally abused: Not on file    Physically abused: Not on file    Forced sexual activity: Not on file  Other Topics Concern  . Not on file  Social History Narrative   Patient lives at home alone single.   Patient is disabled.   Education some college education.   Right handed.   Caffeine four cups of tea daily.      PHYSICAL EXAM  Vitals:   11/15/18 0917  BP: 113/69  Pulse: 64  Temp: 97.8 F (36.6 C)  TempSrc: Oral  Weight: 166 lb 3.2 oz (75.4 kg)  Height: 5\' 4"  (1.626 m)   Body mass index is 28.53 kg/m.  Generalized: Well developed, in no acute distress   Neurological examination  Mentation: Alert oriented to time, place, history taking. Follows all commands speech and language fluent Cranial nerve II-XII:Extraocular movements were full, visual field were full on confrontational test.Head turning and shoulder shrug  were normal and symmetric. Motor: The motor testing reveals 5 over 5 strength of all 4 extremities. Good symmetric motor tone is noted throughout.  Sensory: Sensory testing is intact to soft touch on all 4 extremities. No evidence of extinction is noted.  Coordination: Cerebellar testing reveals good finger-nose-finger and heel-to-shin bilaterally.  Gait and station: Gait is normal.   DIAGNOSTIC DATA (LABS, IMAGING, TESTING) - I reviewed patient records, labs, notes, testing and imaging myself where available.  Lab Results  Component Value Date   WBC 7.7 11/09/2017   HGB 13.4 11/09/2017   HCT 41.7 11/09/2017   MCV 92 11/09/2017   PLT 329 11/09/2017      Component Value Date/Time   NA 145 (H) 11/09/2017 1038   K 4.3 11/09/2017 1038   CL 108 (H) 11/09/2017 1038   CO2 20  11/09/2017 1038   GLUCOSE 77 11/09/2017 1038   BUN 14 11/09/2017 1038   CREATININE 0.76 11/09/2017 1038   CALCIUM 9.8 11/09/2017 1038   PROT 7.0 11/09/2017 1038   ALBUMIN 4.2 11/09/2017 1038   AST 15 11/09/2017 1038   ALT 13 11/09/2017 1038   ALKPHOS 160 (H) 11/09/2017 1038   BILITOT <0.2 11/09/2017  [place] 11/09/2017 1038   GFRAA 97 11/09/2017 1038      ASSESSMENT AND PLAN 63 y.o. year old female  has a past medical history of Colon polyps (2014), Condyloma, Depression, Epilepsy ([place]), Osteopenia (04/2018), and Seizures ([place]). here with :  1.  Seizures  Overall the patient is doing well.  She will continue on Topamax, Keppra and felbamate.  I will check a felbamate level today.  She is advised that if her symptoms worsen or she develops new symptoms she should let us know.  She will follow-up in 1 year or sooner if needed.  I spent 15 minutes with the patient. 50% of this time was spent reviewing medication and seizure precautions   Ward Givens, MSN, NP-C 11/15/2018, 9:31 AM [facility] [address], [place],  [address] [phone]

## 2018-11-15 NOTE — Patient Instructions (Signed)
Your Plan:  Continue keppra, Topamax and Felbamate Blood work today If your symptoms worsen or you develop new symptoms please let us know.    Thank you for coming to see [place] at [facility]. I hope we have been able to provide you high quality care today.  You may receive a patient satisfaction survey over the next few weeks. We would appreciate your feedback and comments so that we may continue to improve ourselves and the health of our patients.

## 2018-11-17 ENCOUNTER — Telehealth: Payer: Self-pay | Admitting: *Deleted

## 2018-11-17 LAB — COMPREHENSIVE METABOLIC PANEL
ALT: 13 IU/L (ref 0–32)
AST: 12 IU/L (ref 0–40)
Albumin/Globulin Ratio: 1.7 (ref 1.2–2.2)
Albumin: 4.4 g/dL (ref 3.8–4.8)
Alkaline Phosphatase: 148 IU/L — ABNORMAL HIGH (ref 39–117)
BUN/Creatinine Ratio: 19 (ref 12–28)
BUN: 15 mg/dL (ref 8–27)
Bilirubin Total: 0.2 mg/dL (ref 0.0–1.2)
CO2: 22 mmol/L (ref 20–29)
Calcium: 9.6 mg/dL (ref 8.7–10.3)
Chloride: 108 mmol/L — ABNORMAL HIGH (ref 96–106)
Creatinine, Ser: 0.78 mg/dL (ref 0.57–1.00)
GFR calc Af Amer: 94 mL/min/{1.73_m2} (ref >59)
GFR calc non Af Amer: 81 mL/min/{1.73_m2} (ref >59)
Globulin, Total: 2.6 g/dL (ref 1.5–4.5)
Glucose: 94 mg/dL (ref 65–99)
Potassium: 4.7 mmol/L (ref 3.5–5.2)
Sodium: 143 mmol/L (ref 134–144)
Total Protein: 7 g/dL (ref 6.0–8.5)

## 2018-11-17 LAB — CBC WITH DIFFERENTIAL/PLATELET
Basophils Absolute: 0.1 10*3/uL (ref 0.0–0.2)
Basos: 1 %
EOS (ABSOLUTE): 0.1 10*3/uL (ref 0.0–0.4)
Eos: 1 %
Hematocrit: 39.8 % (ref 34.0–46.6)
Hemoglobin: 13.4 g/dL (ref 11.1–15.9)
Immature Grans (Abs): 0 10*3/uL (ref 0.0–0.1)
Immature Granulocytes: 0 %
Lymphocytes Absolute: 2.5 10*3/uL (ref 0.7–3.1)
Lymphs: 37 %
MCH: 30.9 pg (ref 26.6–33.0)
MCHC: 33.7 g/dL (ref 31.5–35.7)
MCV: 92 fL (ref 79–97)
Monocytes Absolute: 0.9 10*3/uL (ref 0.1–0.9)
Monocytes: 13 %
Neutrophils Absolute: 3.3 10*3/uL (ref 1.4–7.0)
Neutrophils: 48 %
Platelets: 307 10*3/uL (ref 150–450)
RBC: 4.34 x10E6/uL (ref 3.77–5.28)
RDW: 12.7 % (ref 11.7–15.4)
WBC: 6.9 10*3/uL (ref 3.4–10.8)

## 2018-11-17 LAB — FELBAMATE LEVEL: Felbamate Lvl: 88 ug/mL (ref 40–100)

## 2018-11-17 NOTE — Telephone Encounter (Signed)
LVM informing patient her labs results are unremarkable- consistent with previous blood work. Left number for any questions.

## 2018-11-23 ENCOUNTER — Other Ambulatory Visit: Payer: Self-pay | Admitting: Adult Health

## 2018-12-06 ENCOUNTER — Encounter: Payer: Self-pay | Admitting: Gynecology

## 2018-12-30 DIAGNOSIS — Z23 Encounter for immunization: Secondary | ICD-10-CM | POA: Diagnosis not present

## 2019-01-22 ENCOUNTER — Other Ambulatory Visit: Payer: Self-pay | Admitting: Neurology

## 2019-03-14 ENCOUNTER — Encounter: Payer: Medicare Other | Admitting: Women's Health

## 2019-03-24 ENCOUNTER — Other Ambulatory Visit: Payer: Self-pay | Admitting: Adult Health

## 2019-04-13 ENCOUNTER — Other Ambulatory Visit: Payer: Self-pay | Admitting: Neurology

## 2019-05-24 ENCOUNTER — Other Ambulatory Visit: Payer: Self-pay

## 2019-05-24 ENCOUNTER — Telehealth: Payer: Self-pay

## 2019-05-24 NOTE — Telephone Encounter (Signed)
Patient called stating she has a yeast infection and has a problem with persistant yeast infections and wondered if you could tell her what she can do.

## 2019-05-24 NOTE — Telephone Encounter (Signed)
It looks like she has an appointment tomorrow with Dr. Dellis Filbert she could get a wet prep checked at appointment.  Occasionally vaginal itching can be from vaginal dryness so best to check wet prep.  I do not see that she has had problems with recurrent yeast that we have treated here.

## 2019-05-25 ENCOUNTER — Ambulatory Visit (INDEPENDENT_AMBULATORY_CARE_PROVIDER_SITE_OTHER): Payer: Medicare Other | Admitting: Obstetrics & Gynecology

## 2019-05-25 ENCOUNTER — Encounter: Payer: Self-pay | Admitting: Obstetrics & Gynecology

## 2019-05-25 ENCOUNTER — Ambulatory Visit: Payer: Medicare Other | Admitting: Obstetrics & Gynecology

## 2019-05-25 ENCOUNTER — Telehealth: Payer: Self-pay | Admitting: *Deleted

## 2019-05-25 DIAGNOSIS — N762 Acute vulvitis: Secondary | ICD-10-CM | POA: Diagnosis not present

## 2019-05-25 DIAGNOSIS — N898 Other specified noninflammatory disorders of vagina: Secondary | ICD-10-CM

## 2019-05-25 LAB — WET PREP FOR TRICH, YEAST, CLUE

## 2019-05-25 MED ORDER — FLUCONAZOLE 150 MG PO TABS: 150.0000 mg | ORAL_TABLET | Freq: Once | ORAL | 1 refills | Status: AC

## 2019-05-25 MED ORDER — TINIDAZOLE 500 MG PO TABS: 1.0000 g | ORAL_TABLET | Freq: Every day | ORAL | 1 refills | Status: DC

## 2019-05-25 MED ORDER — CLOBETASOL PROPIONATE 0.05 % EX OINT: 1.0000 "application " | TOPICAL_OINTMENT | Freq: Two times a day (BID) | CUTANEOUS | 1 refills | Status: DC

## 2019-05-25 NOTE — Progress Notes (Signed)
    Amber Bowman 04-04-1955 [id number]        64 y.o.  G0  RP: Vulvar irritation and itching  HPI: Vulvar irritation and itching with mild increase in vaginal discharge.  No pelvic pain.  No fever.  Urine and bowel movements normal.   OB History  Gravida Para Term Preterm AB Living  0            SAB TAB Ectopic Multiple Live Births               Past medical history,surgical history, problem list, medications, allergies, family history and social history were all reviewed and documented in the EPIC chart.   Directed ROS with pertinent positives and negatives documented in the history of present illness/assessment and plan.  Exam:  There were no vitals filed for this visit. General appearance:  Normal  Abdomen: Normal  Gynecologic exam: Vulva with erythema.  Speculum:  Cervix/Vagina normal.  Increased vaginal d/c.  Wet prep done.  Wet prep:  Clue cells present   Assessment/Plan:  64 y.o. G0P0   1. Vaginal pruritus Bacterial vaginosis confirmed by wet prep.  Diagnoses reviewed and decision to treat with tinidazole.  Usage reviewed and prescription sent to pharmacy.  Will give fluconazole 1 tablet per mouth after the antibiotic to prevent or treat yeast vaginitis. - WET PREP FOR [place], YEAST, CLUE  2. Acute vulvitis Probable vulvar irritation secondary to BV.  Will treat with clobetasol ointment to decrease the vulvar discomfort and irritation faster.  Usage reviewed and prescription sent to pharmacy.  Other orders - tinidazole (TINDAMAX) 500 MG tablet; Take 2 tablets (1,000 mg total) by mouth daily for 2 days. - fluconazole (DIFLUCAN) 150 MG tablet; Take 1 tablet (150 mg total) by mouth once for 1 dose. - clobetasol ointment (TEMOVATE) 0.05 %; Apply 1 application topically 2 (two) times daily for 14 days. Thin vulvar application on the involved areas.  Princess Bruins MD, 3:27 PM 05/25/2019

## 2019-05-25 NOTE — Telephone Encounter (Addendum)
Patient was seen today and Rx was sent for clobetasol ointment, medication is not covered by her insurance. The pharmacist said betamethasone is listed under formulary as covered drug.  Also the tindamax  500 mg tablet is not covered as well, but the metrogel is covered.    Please advise

## 2019-05-26 MED ORDER — BETAMETHASONE VALERATE 0.1 % EX OINT: 1.0000 "application " | TOPICAL_OINTMENT | Freq: Two times a day (BID) | CUTANEOUS | 1 refills | Status: DC

## 2019-05-26 MED ORDER — METRONIDAZOLE 0.75 % VA GEL: 1.0000 | Freq: Two times a day (BID) | VAGINAL | 0 refills | Status: DC

## 2019-05-26 NOTE — Telephone Encounter (Signed)
Rxs sent

## 2019-05-26 NOTE — Telephone Encounter (Signed)
Agree with Betamethasone and Metrogel.

## 2019-05-26 NOTE — Addendum Note (Signed)
Addended by: Ramond Craver on: 05/26/2019 10:15 AM   Modules accepted: Orders

## 2019-06-01 ENCOUNTER — Encounter: Payer: Self-pay | Admitting: Obstetrics & Gynecology

## 2019-06-01 NOTE — Patient Instructions (Signed)
1. Vaginal pruritus Bacterial vaginosis confirmed by wet prep.  Diagnoses reviewed and decision to treat with tinidazole.  Usage reviewed and prescription sent to pharmacy.  Will give fluconazole 1 tablet per mouth after the antibiotic to prevent or treat yeast vaginitis. - WET PREP FOR Amber Bowman, YEAST, CLUE  2. Acute vulvitis Probable vulvar irritation secondary to BV.  Will treat with clobetasol ointment to decrease the vulvar discomfort and irritation faster.  Usage reviewed and prescription sent to pharmacy.  Other orders - tinidazole (TINDAMAX) 500 MG tablet; Take 2 tablets (1,000 mg total) by mouth daily for 2 days. - fluconazole (DIFLUCAN) 150 MG tablet; Take 1 tablet (150 mg total) by mouth once for 1 dose. - clobetasol ointment (TEMOVATE) 0.05 %; Apply 1 application topically 2 (two) times daily for 14 days. Thin vulvar application on the involved areas.  [name], it was a pleasure seeing you today!

## 2019-06-04 ENCOUNTER — Other Ambulatory Visit: Payer: Self-pay | Admitting: Adult Health

## 2019-06-08 DIAGNOSIS — I429 Cardiomyopathy, unspecified: Secondary | ICD-10-CM

## 2019-06-08 HISTORY — DX: Cardiomyopathy, unspecified: I42.9

## 2019-06-26 ENCOUNTER — Other Ambulatory Visit: Payer: Self-pay

## 2019-06-26 ENCOUNTER — Emergency Department (HOSPITAL_COMMUNITY)
Admission: EM | Admit: 2019-06-26 | Discharge: 2019-06-27 | Disposition: A | Discharge status: Home / Self Care | Payer: Medicare Other | Source: Home / Self Care

## 2019-06-26 ENCOUNTER — Encounter (HOSPITAL_COMMUNITY): Payer: Self-pay | Admitting: Emergency Medicine

## 2019-06-26 DIAGNOSIS — R1011 Right upper quadrant pain: Secondary | ICD-10-CM | POA: Diagnosis not present

## 2019-06-26 DIAGNOSIS — M549 Dorsalgia, unspecified: Secondary | ICD-10-CM | POA: Diagnosis not present

## 2019-06-26 DIAGNOSIS — G9341 Metabolic encephalopathy: Secondary | ICD-10-CM | POA: Diagnosis not present

## 2019-06-26 DIAGNOSIS — N179 Acute kidney failure, unspecified: Secondary | ICD-10-CM | POA: Diagnosis not present

## 2019-06-26 DIAGNOSIS — U071 COVID-19: Secondary | ICD-10-CM | POA: Diagnosis not present

## 2019-06-26 DIAGNOSIS — K8020 Calculus of gallbladder without cholecystitis without obstruction: Secondary | ICD-10-CM | POA: Diagnosis not present

## 2019-06-26 DIAGNOSIS — A4151 Sepsis due to Escherichia coli [E. coli]: Secondary | ICD-10-CM | POA: Diagnosis not present

## 2019-06-26 DIAGNOSIS — N170 Acute kidney failure with tubular necrosis: Secondary | ICD-10-CM | POA: Diagnosis not present

## 2019-06-26 DIAGNOSIS — R6521 Severe sepsis with septic shock: Secondary | ICD-10-CM | POA: Diagnosis not present

## 2019-06-26 DIAGNOSIS — Z5321 Procedure and treatment not carried out due to patient leaving prior to being seen by health care provider: Secondary | ICD-10-CM | POA: Insufficient documentation

## 2019-06-26 DIAGNOSIS — J9601 Acute respiratory failure with hypoxia: Secondary | ICD-10-CM | POA: Diagnosis not present

## 2019-06-26 DIAGNOSIS — R0989 Other specified symptoms and signs involving the circulatory and respiratory systems: Secondary | ICD-10-CM | POA: Diagnosis not present

## 2019-06-26 DIAGNOSIS — R109 Unspecified abdominal pain: Secondary | ICD-10-CM | POA: Insufficient documentation

## 2019-06-26 DIAGNOSIS — R579 Shock, unspecified: Secondary | ICD-10-CM | POA: Diagnosis not present

## 2019-06-26 LAB — COMPREHENSIVE METABOLIC PANEL
ALT: 23 U/L (ref 0–44)
AST: 29 U/L (ref 15–41)
Albumin: 3.8 g/dL (ref 3.5–5.0)
Alkaline Phosphatase: 132 U/L — ABNORMAL HIGH (ref 38–126)
Anion gap: 12 (ref 5–15)
BUN: 22 mg/dL (ref 8–23)
CO2: 17 mmol/L — ABNORMAL LOW (ref 22–32)
Calcium: 9 mg/dL (ref 8.9–10.3)
Chloride: 112 mmol/L — ABNORMAL HIGH (ref 98–111)
Creatinine, Ser: 1.44 mg/dL — ABNORMAL HIGH (ref 0.44–1.00)
GFR calc Af Amer: 44 mL/min — ABNORMAL LOW (ref >60)
GFR calc non Af Amer: 38 mL/min — ABNORMAL LOW (ref >60)
Glucose, Bld: 107 mg/dL — ABNORMAL HIGH (ref 70–99)
Potassium: 3.7 mmol/L (ref 3.5–5.1)
Sodium: 141 mmol/L (ref 135–145)
Total Bilirubin: 0.6 mg/dL (ref 0.3–1.2)
Total Protein: 7.1 g/dL (ref 6.5–8.1)

## 2019-06-26 LAB — URINALYSIS, ROUTINE W REFLEX MICROSCOPIC
Bilirubin Urine: NEGATIVE
Glucose, UA: NEGATIVE mg/dL
Ketones, ur: NEGATIVE mg/dL
Nitrite: POSITIVE — AB
Protein, ur: NEGATIVE mg/dL
RBC / HPF: >50 RBC/hpf — ABNORMAL HIGH (ref 0–5)
Specific Gravity, Urine: 1.025 (ref 1.005–1.030)
pH: 5 (ref 5.0–8.0)

## 2019-06-26 LAB — CBC WITH DIFFERENTIAL/PLATELET
Abs Immature Granulocytes: 0.01 10*3/uL (ref 0.00–0.07)
Basophils Absolute: 0 10*3/uL (ref 0.0–0.1)
Basophils Relative: 1 %
Eosinophils Absolute: 0 10*3/uL (ref 0.0–0.5)
Eosinophils Relative: 1 %
HCT: 42.3 % (ref 36.0–46.0)
Hemoglobin: 13.6 g/dL (ref 12.0–15.0)
Immature Granulocytes: 0 %
Lymphocytes Relative: 11 %
Lymphs Abs: 0.5 10*3/uL — ABNORMAL LOW (ref 0.7–4.0)
MCH: 31.2 pg (ref 26.0–34.0)
MCHC: 32.2 g/dL (ref 30.0–36.0)
MCV: 97 fL (ref 80.0–100.0)
Monocytes Absolute: 0 10*3/uL — ABNORMAL LOW (ref 0.1–1.0)
Monocytes Relative: 1 %
Neutro Abs: 3.7 10*3/uL (ref 1.7–7.7)
Neutrophils Relative %: 86 %
Platelets: 280 10*3/uL (ref 150–400)
RBC: 4.36 MIL/uL (ref 3.87–5.11)
RDW: 13.4 % (ref 11.5–15.5)
WBC: 4.2 10*3/uL (ref 4.0–10.5)
nRBC: 0 % (ref 0.0–0.2)

## 2019-06-26 LAB — LACTIC ACID, PLASMA: Lactic Acid, Venous: 2.4 mmol/L (ref 0.5–1.9)

## 2019-06-26 LAB — TROPONIN I (HIGH SENSITIVITY): Troponin I (High Sensitivity): 5 ng/L (ref <18)

## 2019-06-26 MED ORDER — SODIUM CHLORIDE 0.9% FLUSH: 3.0000 mL | Freq: Once | INTRAVENOUS | Status: DC

## 2019-06-26 NOTE — ED Triage Notes (Signed)
Pt reports left sided flank pain that started yesterday.  We found her hr in triage to be 140, fever of 101.3.  No other complaints.  Does appear SOB but states it is "because I'm nervous."

## 2019-06-27 ENCOUNTER — Inpatient Hospital Stay (HOSPITAL_COMMUNITY): Payer: Medicare Other

## 2019-06-27 ENCOUNTER — Emergency Department (HOSPITAL_COMMUNITY): Payer: Medicare Other

## 2019-06-27 ENCOUNTER — Encounter (HOSPITAL_COMMUNITY): Payer: Self-pay | Admitting: Emergency Medicine

## 2019-06-27 ENCOUNTER — Inpatient Hospital Stay (HOSPITAL_COMMUNITY)
Admission: EM | Admit: 2019-06-27 | Discharge: 2019-07-11 | DRG: 871 | Disposition: A | Discharge status: Home / Self Care | Payer: Medicare Other | Attending: Internal Medicine | Admitting: Internal Medicine

## 2019-06-27 DIAGNOSIS — N170 Acute kidney failure with tubular necrosis: Secondary | ICD-10-CM | POA: Diagnosis not present

## 2019-06-27 DIAGNOSIS — Z596 Low income: Secondary | ICD-10-CM

## 2019-06-27 DIAGNOSIS — R1032 Left lower quadrant pain: Secondary | ICD-10-CM | POA: Diagnosis not present

## 2019-06-27 DIAGNOSIS — R34 Anuria and oliguria: Secondary | ICD-10-CM | POA: Diagnosis not present

## 2019-06-27 DIAGNOSIS — I5022 Chronic systolic (congestive) heart failure: Secondary | ICD-10-CM | POA: Diagnosis present

## 2019-06-27 DIAGNOSIS — J181 Lobar pneumonia, unspecified organism: Secondary | ICD-10-CM | POA: Diagnosis not present

## 2019-06-27 DIAGNOSIS — I959 Hypotension, unspecified: Secondary | ICD-10-CM | POA: Diagnosis not present

## 2019-06-27 DIAGNOSIS — Z4659 Encounter for fitting and adjustment of other gastrointestinal appliance and device: Secondary | ICD-10-CM

## 2019-06-27 DIAGNOSIS — J9601 Acute respiratory failure with hypoxia: Secondary | ICD-10-CM | POA: Diagnosis present

## 2019-06-27 DIAGNOSIS — Z09 Encounter for follow-up examination after completed treatment for conditions other than malignant neoplasm: Secondary | ICD-10-CM

## 2019-06-27 DIAGNOSIS — F39 Unspecified mood [affective] disorder: Secondary | ICD-10-CM | POA: Diagnosis present

## 2019-06-27 DIAGNOSIS — R6520 Severe sepsis without septic shock: Secondary | ICD-10-CM | POA: Diagnosis not present

## 2019-06-27 DIAGNOSIS — E876 Hypokalemia: Secondary | ICD-10-CM | POA: Diagnosis not present

## 2019-06-27 DIAGNOSIS — B001 Herpesviral vesicular dermatitis: Secondary | ICD-10-CM | POA: Diagnosis present

## 2019-06-27 DIAGNOSIS — E162 Hypoglycemia, unspecified: Secondary | ICD-10-CM | POA: Diagnosis present

## 2019-06-27 DIAGNOSIS — Z6827 Body mass index (BMI) 27.0-27.9, adult: Secondary | ICD-10-CM

## 2019-06-27 DIAGNOSIS — R509 Fever, unspecified: Secondary | ICD-10-CM | POA: Diagnosis not present

## 2019-06-27 DIAGNOSIS — R197 Diarrhea, unspecified: Secondary | ICD-10-CM | POA: Diagnosis present

## 2019-06-27 DIAGNOSIS — G9340 Encephalopathy, unspecified: Secondary | ICD-10-CM | POA: Diagnosis not present

## 2019-06-27 DIAGNOSIS — R1011 Right upper quadrant pain: Secondary | ICD-10-CM | POA: Diagnosis not present

## 2019-06-27 DIAGNOSIS — Q211 Atrial septal defect: Secondary | ICD-10-CM | POA: Diagnosis not present

## 2019-06-27 DIAGNOSIS — Z87442 Personal history of urinary calculi: Secondary | ICD-10-CM

## 2019-06-27 DIAGNOSIS — K8020 Calculus of gallbladder without cholecystitis without obstruction: Secondary | ICD-10-CM | POA: Diagnosis present

## 2019-06-27 DIAGNOSIS — R6521 Severe sepsis with septic shock: Secondary | ICD-10-CM | POA: Diagnosis not present

## 2019-06-27 DIAGNOSIS — N200 Calculus of kidney: Secondary | ICD-10-CM | POA: Diagnosis not present

## 2019-06-27 DIAGNOSIS — U071 COVID-19: Secondary | ICD-10-CM | POA: Diagnosis not present

## 2019-06-27 DIAGNOSIS — E663 Overweight: Secondary | ICD-10-CM | POA: Diagnosis present

## 2019-06-27 DIAGNOSIS — N179 Acute kidney failure, unspecified: Secondary | ICD-10-CM | POA: Diagnosis not present

## 2019-06-27 DIAGNOSIS — G40909 Epilepsy, unspecified, not intractable, without status epilepticus: Secondary | ICD-10-CM

## 2019-06-27 DIAGNOSIS — R630 Anorexia: Secondary | ICD-10-CM | POA: Diagnosis not present

## 2019-06-27 DIAGNOSIS — I361 Nonrheumatic tricuspid (valve) insufficiency: Secondary | ICD-10-CM | POA: Diagnosis not present

## 2019-06-27 DIAGNOSIS — D631 Anemia in chronic kidney disease: Secondary | ICD-10-CM | POA: Diagnosis present

## 2019-06-27 DIAGNOSIS — N201 Calculus of ureter: Secondary | ICD-10-CM | POA: Diagnosis not present

## 2019-06-27 DIAGNOSIS — J9690 Respiratory failure, unspecified, unspecified whether with hypoxia or hypercapnia: Secondary | ICD-10-CM

## 2019-06-27 DIAGNOSIS — J1282 Pneumonia due to coronavirus disease 2019: Secondary | ICD-10-CM | POA: Diagnosis not present

## 2019-06-27 DIAGNOSIS — A419 Sepsis, unspecified organism: Secondary | ICD-10-CM

## 2019-06-27 DIAGNOSIS — R748 Abnormal levels of other serum enzymes: Secondary | ICD-10-CM | POA: Diagnosis present

## 2019-06-27 DIAGNOSIS — G629 Polyneuropathy, unspecified: Secondary | ICD-10-CM | POA: Diagnosis present

## 2019-06-27 DIAGNOSIS — G9341 Metabolic encephalopathy: Secondary | ICD-10-CM | POA: Diagnosis not present

## 2019-06-27 DIAGNOSIS — N12 Tubulo-interstitial nephritis, not specified as acute or chronic: Secondary | ICD-10-CM | POA: Diagnosis not present

## 2019-06-27 DIAGNOSIS — N390 Urinary tract infection, site not specified: Secondary | ICD-10-CM | POA: Diagnosis not present

## 2019-06-27 DIAGNOSIS — A4151 Sepsis due to Escherichia coli [E. coli]: Secondary | ICD-10-CM | POA: Diagnosis not present

## 2019-06-27 DIAGNOSIS — N10 Acute tubulo-interstitial nephritis: Secondary | ICD-10-CM | POA: Diagnosis present

## 2019-06-27 DIAGNOSIS — N312 Flaccid neuropathic bladder, not elsewhere classified: Secondary | ICD-10-CM | POA: Diagnosis not present

## 2019-06-27 DIAGNOSIS — Z8719 Personal history of other diseases of the digestive system: Secondary | ICD-10-CM

## 2019-06-27 DIAGNOSIS — B9620 Unspecified Escherichia coli [E. coli] as the cause of diseases classified elsewhere: Secondary | ICD-10-CM | POA: Diagnosis not present

## 2019-06-27 DIAGNOSIS — R202 Paresthesia of skin: Secondary | ICD-10-CM | POA: Diagnosis not present

## 2019-06-27 DIAGNOSIS — K5730 Diverticulosis of large intestine without perforation or abscess without bleeding: Secondary | ICD-10-CM | POA: Diagnosis not present

## 2019-06-27 DIAGNOSIS — R7881 Bacteremia: Secondary | ICD-10-CM | POA: Diagnosis not present

## 2019-06-27 DIAGNOSIS — E872 Acidosis: Secondary | ICD-10-CM

## 2019-06-27 DIAGNOSIS — E860 Dehydration: Secondary | ICD-10-CM | POA: Diagnosis present

## 2019-06-27 DIAGNOSIS — R0989 Other specified symptoms and signs involving the circulatory and respiratory systems: Secondary | ICD-10-CM | POA: Diagnosis not present

## 2019-06-27 DIAGNOSIS — Z452 Encounter for adjustment and management of vascular access device: Secondary | ICD-10-CM

## 2019-06-27 DIAGNOSIS — D696 Thrombocytopenia, unspecified: Secondary | ICD-10-CM | POA: Diagnosis present

## 2019-06-27 DIAGNOSIS — M8580 Other specified disorders of bone density and structure, unspecified site: Secondary | ICD-10-CM | POA: Diagnosis present

## 2019-06-27 DIAGNOSIS — R600 Localized edema: Secondary | ICD-10-CM

## 2019-06-27 DIAGNOSIS — Z6828 Body mass index (BMI) 28.0-28.9, adult: Secondary | ICD-10-CM

## 2019-06-27 DIAGNOSIS — R112 Nausea with vomiting, unspecified: Secondary | ICD-10-CM | POA: Diagnosis not present

## 2019-06-27 DIAGNOSIS — R579 Shock, unspecified: Secondary | ICD-10-CM | POA: Diagnosis not present

## 2019-06-27 DIAGNOSIS — D72829 Elevated white blood cell count, unspecified: Secondary | ICD-10-CM | POA: Diagnosis present

## 2019-06-27 DIAGNOSIS — I340 Nonrheumatic mitral (valve) insufficiency: Secondary | ICD-10-CM | POA: Diagnosis not present

## 2019-06-27 DIAGNOSIS — Z79899 Other long term (current) drug therapy: Secondary | ICD-10-CM

## 2019-06-27 DIAGNOSIS — R0602 Shortness of breath: Secondary | ICD-10-CM | POA: Diagnosis not present

## 2019-06-27 DIAGNOSIS — N136 Pyonephrosis: Secondary | ICD-10-CM | POA: Diagnosis present

## 2019-06-27 DIAGNOSIS — F329 Major depressive disorder, single episode, unspecified: Secondary | ICD-10-CM | POA: Diagnosis present

## 2019-06-27 DIAGNOSIS — R609 Edema, unspecified: Secondary | ICD-10-CM

## 2019-06-27 DIAGNOSIS — J811 Chronic pulmonary edema: Secondary | ICD-10-CM | POA: Diagnosis not present

## 2019-06-27 DIAGNOSIS — J9811 Atelectasis: Secondary | ICD-10-CM | POA: Diagnosis present

## 2019-06-27 DIAGNOSIS — Z4682 Encounter for fitting and adjustment of non-vascular catheter: Secondary | ICD-10-CM | POA: Diagnosis not present

## 2019-06-27 DIAGNOSIS — F419 Anxiety disorder, unspecified: Secondary | ICD-10-CM | POA: Diagnosis not present

## 2019-06-27 DIAGNOSIS — R680 Hypothermia, not associated with low environmental temperature: Secondary | ICD-10-CM | POA: Diagnosis not present

## 2019-06-27 DIAGNOSIS — R5383 Other fatigue: Secondary | ICD-10-CM | POA: Diagnosis not present

## 2019-06-27 DIAGNOSIS — D72823 Leukemoid reaction: Secondary | ICD-10-CM | POA: Diagnosis not present

## 2019-06-27 DIAGNOSIS — M549 Dorsalgia, unspecified: Secondary | ICD-10-CM | POA: Diagnosis not present

## 2019-06-27 DIAGNOSIS — J90 Pleural effusion, not elsewhere classified: Secondary | ICD-10-CM | POA: Diagnosis not present

## 2019-06-27 DIAGNOSIS — Z7982 Long term (current) use of aspirin: Secondary | ICD-10-CM

## 2019-06-27 DIAGNOSIS — K121 Other forms of stomatitis: Secondary | ICD-10-CM | POA: Diagnosis not present

## 2019-06-27 DIAGNOSIS — N1330 Unspecified hydronephrosis: Secondary | ICD-10-CM

## 2019-06-27 LAB — BLOOD GAS, VENOUS
Acid-base deficit: 11.9 mmol/L — ABNORMAL HIGH (ref 0.0–2.0)
Bicarbonate: 13.7 mmol/L — ABNORMAL LOW (ref 20.0–28.0)
O2 Saturation: 52.3 %
Patient temperature: 98.6
pCO2, Ven: 31.3 mmHg — ABNORMAL LOW (ref 44.0–60.0)
pH, Ven: 7.263 (ref 7.250–7.430)
pO2, Ven: 32.3 mmHg (ref 32.0–45.0)

## 2019-06-27 LAB — COMPREHENSIVE METABOLIC PANEL
ALT: 36 U/L (ref 0–44)
ALT: 27 U/L (ref 0–44)
AST: 43 U/L — ABNORMAL HIGH (ref 15–41)
AST: 36 U/L (ref 15–41)
Albumin: 3.5 g/dL (ref 3.5–5.0)
Albumin: 2.6 g/dL — ABNORMAL LOW (ref 3.5–5.0)
Alkaline Phosphatase: 200 U/L — ABNORMAL HIGH (ref 38–126)
Alkaline Phosphatase: 116 U/L (ref 38–126)
Anion gap: 15 (ref 5–15)
Anion gap: 15 (ref 5–15)
BUN: 37 mg/dL — ABNORMAL HIGH (ref 8–23)
BUN: 38 mg/dL — ABNORMAL HIGH (ref 8–23)
CO2: 14 mmol/L — ABNORMAL LOW (ref 22–32)
CO2: 14 mmol/L — ABNORMAL LOW (ref 22–32)
Calcium: 8.9 mg/dL (ref 8.9–10.3)
Calcium: 7.8 mg/dL — ABNORMAL LOW (ref 8.9–10.3)
Chloride: 112 mmol/L — ABNORMAL HIGH (ref 98–111)
Chloride: 113 mmol/L — ABNORMAL HIGH (ref 98–111)
Creatinine, Ser: 3.13 mg/dL — ABNORMAL HIGH (ref 0.44–1.00)
Creatinine, Ser: 3.1 mg/dL — ABNORMAL HIGH (ref 0.44–1.00)
GFR calc Af Amer: 17 mL/min — ABNORMAL LOW (ref >60)
GFR calc Af Amer: 18 mL/min — ABNORMAL LOW (ref >60)
GFR calc non Af Amer: 15 mL/min — ABNORMAL LOW (ref >60)
GFR calc non Af Amer: 15 mL/min — ABNORMAL LOW (ref >60)
Glucose, Bld: 115 mg/dL — ABNORMAL HIGH (ref 70–99)
Glucose, Bld: 93 mg/dL (ref 70–99)
Potassium: 3.1 mmol/L — ABNORMAL LOW (ref 3.5–5.1)
Potassium: 3 mmol/L — ABNORMAL LOW (ref 3.5–5.1)
Sodium: 141 mmol/L (ref 135–145)
Sodium: 142 mmol/L (ref 135–145)
Total Bilirubin: 2.1 mg/dL — ABNORMAL HIGH (ref 0.3–1.2)
Total Bilirubin: 1.2 mg/dL (ref 0.3–1.2)
Total Protein: 7.1 g/dL (ref 6.5–8.1)
Total Protein: 5.4 g/dL — ABNORMAL LOW (ref 6.5–8.1)

## 2019-06-27 LAB — PROTIME-INR
INR: 1.7 — ABNORMAL HIGH (ref 0.8–1.2)
Prothrombin Time: 19.6 seconds — ABNORMAL HIGH (ref 11.4–15.2)

## 2019-06-27 LAB — CBC WITH DIFFERENTIAL/PLATELET
Abs Immature Granulocytes: 0.19 10*3/uL — ABNORMAL HIGH (ref 0.00–0.07)
Basophils Absolute: 0 10*3/uL (ref 0.0–0.1)
Basophils Relative: 0 %
Eosinophils Absolute: 0.1 10*3/uL (ref 0.0–0.5)
Eosinophils Relative: 1 %
HCT: 44 % (ref 36.0–46.0)
Hemoglobin: 14.2 g/dL (ref 12.0–15.0)
Immature Granulocytes: 3 %
Lymphocytes Relative: 8 %
Lymphs Abs: 0.5 10*3/uL — ABNORMAL LOW (ref 0.7–4.0)
MCH: 31.3 pg (ref 26.0–34.0)
MCHC: 32.3 g/dL (ref 30.0–36.0)
MCV: 96.9 fL (ref 80.0–100.0)
Monocytes Absolute: 0.1 10*3/uL (ref 0.1–1.0)
Monocytes Relative: 2 %
Neutro Abs: 5 10*3/uL (ref 1.7–7.7)
Neutrophils Relative %: 86 %
Platelets: 89 10*3/uL — ABNORMAL LOW (ref 150–400)
RBC: 4.54 MIL/uL (ref 3.87–5.11)
RDW: 13.7 % (ref 11.5–15.5)
WBC: 5.9 10*3/uL (ref 4.0–10.5)
nRBC: 0.5 % — ABNORMAL HIGH (ref 0.0–0.2)

## 2019-06-27 LAB — TYPE AND SCREEN
ABO/RH(D): O POS
Antibody Screen: NEGATIVE

## 2019-06-27 LAB — LACTATE DEHYDROGENASE: LDH: 398 U/L — ABNORMAL HIGH (ref 98–192)

## 2019-06-27 LAB — RESPIRATORY PANEL BY RT PCR (FLU A&B, COVID)
Influenza A by PCR: NEGATIVE
Influenza B by PCR: NEGATIVE
SARS Coronavirus 2 by RT PCR: NEGATIVE

## 2019-06-27 LAB — DIC (DISSEMINATED INTRAVASCULAR COAGULATION)PANEL
D-Dimer, Quant: >20 ug/mL-FEU — ABNORMAL HIGH (ref 0.00–0.50)
Fibrinogen: 182 mg/dL — ABNORMAL LOW (ref 210–475)
INR: 1.6 — ABNORMAL HIGH (ref 0.8–1.2)
Platelets: 80 10*3/uL — ABNORMAL LOW (ref 150–400)
Prothrombin Time: 19.1 seconds — ABNORMAL HIGH (ref 11.4–15.2)
Smear Review: NONE SEEN
aPTT: 51 seconds — ABNORMAL HIGH (ref 24–36)

## 2019-06-27 LAB — LIPASE, BLOOD: Lipase: 21 U/L (ref 11–51)

## 2019-06-27 LAB — LACTIC ACID, PLASMA
Lactic Acid, Venous: 4.6 mmol/L (ref 0.5–1.9)
Lactic Acid, Venous: 5 mmol/L (ref 0.5–1.9)

## 2019-06-27 LAB — GLUCOSE, CAPILLARY: Glucose-Capillary: 82 mg/dL (ref 70–99)

## 2019-06-27 LAB — APTT: aPTT: 60 seconds — ABNORMAL HIGH (ref 24–36)

## 2019-06-27 LAB — PROCALCITONIN: Procalcitonin: 83.55 ng/mL

## 2019-06-27 LAB — CBG MONITORING, ED: Glucose-Capillary: 94 mg/dL (ref 70–99)

## 2019-06-27 LAB — POC SARS CORONAVIRUS 2 AG -  ED: SARS Coronavirus 2 Ag: POSITIVE — AB

## 2019-06-27 LAB — AMYLASE: Amylase: 53 U/L (ref 28–100)

## 2019-06-27 MED ORDER — CHLORHEXIDINE GLUCONATE CLOTH 2 % EX PADS
6.0000 | MEDICATED_PAD | Freq: Every day | CUTANEOUS | Status: DC
  Administered 2019-06-28 – 2019-07-11 (×14): 6 via TOPICAL

## 2019-06-27 MED ORDER — FELBAMATE 600 MG/5ML PO SUSP
300.0000 mg | Freq: Two times a day (BID) | ORAL | Status: DC
  Administered 2019-06-28 – 2019-07-01 (×8): 300 mg via ORAL
  Filled 2019-06-27 (×13): qty 2.5

## 2019-06-27 MED ORDER — SODIUM CHLORIDE 0.9 % IV SOLN
100.0000 mg | Freq: Every day | INTRAVENOUS | Status: DC
  Administered 2019-06-28 – 2019-06-29 (×2): 100 mg via INTRAVENOUS
  Filled 2019-06-27 (×3): qty 20

## 2019-06-27 MED ORDER — INSULIN ASPART 100 UNIT/ML ~~LOC~~ SOLN
3.0000 [IU] | SUBCUTANEOUS | Status: DC
  Filled 2019-06-27: qty 0.09

## 2019-06-27 MED ORDER — LACTATED RINGERS IV BOLUS (SEPSIS)
1500.0000 mL | Freq: Once | INTRAVENOUS | Status: AC
  Administered 2019-06-27: 1500 mL via INTRAVENOUS

## 2019-06-27 MED ORDER — ADULT MULTIVITAMIN W/MINERALS CH
1.0000 | ORAL_TABLET | Freq: Every day | ORAL | Status: DC
  Administered 2019-06-27 – 2019-07-01 (×4): 1 via ORAL
  Filled 2019-06-27 (×5): qty 1

## 2019-06-27 MED ORDER — SODIUM CHLORIDE 0.9 % IV SOLN
1.0000 g | INTRAVENOUS | Status: DC
  Administered 2019-06-27: 1 g via INTRAVENOUS
  Filled 2019-06-27: qty 10

## 2019-06-27 MED ORDER — SODIUM BICARBONATE 8.4 % IV SOLN
INTRAVENOUS | Status: AC
  Filled 2019-06-27: qty 50

## 2019-06-27 MED ORDER — SODIUM CHLORIDE 0.9 % IV BOLUS (SEPSIS)
1000.0000 mL | Freq: Once | INTRAVENOUS | Status: AC
  Administered 2019-06-27: 1000 mL via INTRAVENOUS

## 2019-06-27 MED ORDER — NOREPINEPHRINE 4 MG/250ML-% IV SOLN
0.0000 ug/min | INTRAVENOUS | Status: DC
  Administered 2019-06-27: 3 ug/min via INTRAVENOUS
  Filled 2019-06-27: qty 250

## 2019-06-27 MED ORDER — SODIUM CHLORIDE 0.9 % IV SOLN: 200.0000 mg | Freq: Once | INTRAVENOUS | Status: DC

## 2019-06-27 MED ORDER — PANTOPRAZOLE SODIUM 40 MG IV SOLR
40.0000 mg | Freq: Every day | INTRAVENOUS | Status: DC
  Administered 2019-06-27 – 2019-07-01 (×5): 40 mg via INTRAVENOUS
  Filled 2019-06-27 (×4): qty 40

## 2019-06-27 MED ORDER — TOPIRAMATE 100 MG PO TABS
200.0000 mg | ORAL_TABLET | Freq: Two times a day (BID) | ORAL | Status: DC
  Administered 2019-06-27 – 2019-07-01 (×8): 200 mg via ORAL
  Filled 2019-06-27 (×9): qty 2

## 2019-06-27 MED ORDER — SODIUM CHLORIDE 0.9 % IV SOLN: 100.0000 mg | Freq: Every day | INTRAVENOUS | Status: DC

## 2019-06-27 MED ORDER — ORAL CARE MOUTH RINSE
15.0000 mL | Freq: Two times a day (BID) | OROMUCOSAL | Status: DC
  Administered 2019-06-27 – 2019-07-02 (×8): 15 mL via OROMUCOSAL

## 2019-06-27 MED ORDER — LACTATED RINGERS IV BOLUS
1000.0000 mL | Freq: Once | INTRAVENOUS | Status: AC
  Administered 2019-06-27: 1000 mL via INTRAVENOUS

## 2019-06-27 MED ORDER — SODIUM BICARBONATE 8.4 % IV SOLN
100.0000 meq | Freq: Once | INTRAVENOUS | Status: AC
  Administered 2019-06-27: 100 meq via INTRAVENOUS
  Filled 2019-06-27: qty 50

## 2019-06-27 MED ORDER — SODIUM CHLORIDE 0.9 % IV SOLN
2.0000 g | INTRAVENOUS | Status: DC
  Administered 2019-06-27 – 2019-07-02 (×6): 2 g via INTRAVENOUS
  Filled 2019-06-27 (×2): qty 2
  Filled 2019-06-27: qty 20
  Filled 2019-06-27 (×3): qty 2

## 2019-06-27 MED ORDER — SODIUM CHLORIDE 0.9 % IV SOLN: INTRAVENOUS | Status: DC | PRN

## 2019-06-27 MED ORDER — ASPIRIN EC 81 MG PO TBEC
81.0000 mg | DELAYED_RELEASE_TABLET | Freq: Every day | ORAL | Status: DC
  Administered 2019-06-27 – 2019-07-01 (×4): 81 mg via ORAL
  Filled 2019-06-27 (×5): qty 1

## 2019-06-27 MED ORDER — LEVETIRACETAM 500 MG PO TABS
500.0000 mg | ORAL_TABLET | Freq: Two times a day (BID) | ORAL | Status: DC
  Administered 2019-06-27 – 2019-06-28 (×3): 500 mg via ORAL
  Filled 2019-06-27 (×3): qty 1

## 2019-06-27 MED ORDER — DEXAMETHASONE SODIUM PHOSPHATE 10 MG/ML IJ SOLN
6.0000 mg | INTRAMUSCULAR | Status: DC
  Administered 2019-06-27 – 2019-07-03 (×7): 6 mg via INTRAVENOUS
  Filled 2019-06-27 (×7): qty 1

## 2019-06-27 MED ORDER — SODIUM CHLORIDE 0.9 % IV SOLN
250.0000 mL | INTRAVENOUS | Status: DC
  Administered 2019-06-28 – 2019-06-29 (×2): 250 mL via INTRAVENOUS
  Administered 2019-06-29: 500 mL via INTRAVENOUS
  Administered 2019-06-30 – 2019-07-04 (×4): 250 mL via INTRAVENOUS

## 2019-06-27 MED ORDER — NOREPINEPHRINE 4 MG/250ML-% IV SOLN
2.0000 ug/min | INTRAVENOUS | Status: DC
  Administered 2019-06-27 – 2019-06-28 (×3): 10 ug/min via INTRAVENOUS
  Filled 2019-06-27 (×3): qty 250

## 2019-06-27 MED ORDER — SODIUM CHLORIDE 0.9 % IV SOLN
100.0000 mg | INTRAVENOUS | Status: AC
  Administered 2019-06-27: 100 mg via INTRAVENOUS
  Filled 2019-06-27: qty 20

## 2019-06-27 NOTE — ED Notes (Signed)
This writer was unsuccessful collecting type and screen and venous blood gas. This Probation officer consulted with MD Kathrynn Humble. This MD stated to hold off on these labs and will proceed if necessary per MD request.

## 2019-06-27 NOTE — H&P (Signed)
NAME:  Amber Bowman, MRN:  [id number], DOB:  [birth year], LOS: 0 ADMISSION DATE:  06/27/2019, CONSULTATION DATE:  06/27/2019  REFERRING MD:  Dr Varney Baas ER, CHIEF COMPLAINT:  Acute renal failure, shoock, lactic acidosis, Covid-19   Brief History   See below  History of present illness    -64 year old relatively healthy female who has not yet been vaccinated for COVID-19.  Presented on June 26, 2019 with SIRS physiology fever 101.3, heart rate 140 and left-sided flank pain and self-reported dyspnea due to anxiety.  Had a lactic acid of 2.0, mild acute kidney injury with creatinine 1.7 and a normal white count.  She waited in the ER for 4 hours apparently and then left.  However on June 27, 2018 when she returned with nonspecific symptoms found to be hypotensive.  Per ER physician patient has had significant amount of vomiting and nausea and poor p.o. intake.  No NSAID use.  She has required 3 L of fluid and low-dose Levophed through peripheral infusion to obtain a made arterial pressure of 65 and a systolic blood pressure greater than 75.  Pulse ox reported as normal throughout the course of the stay and no respiratory issues but at time of PCCM evaluation pulse ox 94% on 2 L nasal cannula.  Mentating quite well but feeling cold.  White count low-normal.  Chest x-ray fairly clear.  Creatinine 3.13 mg percent and a lactic acid of greater than 4 mg percent.  Critical care medicine asked admit the patient.  Bedside ultrasound showed evidence of gallstones and mild biliary duct dilatation.  CC is Dr. Barry Dienes has been called by ER.  Liver function test normal other than elevated alkaline phosphatase 200.  Urine analysis on June 26, 2019: Red cells greater than 50 and white cells greater than 11 with positive nitrates and leukocytes.  Suggestive of UTI  No significant Covid history other than age greater than 68.  She is Covid positive on testing in the ER  Past medical history positive for  epilepsy/seizures on Felbatol, topiramate and Keppra  Past Medical History     has a past medical history of Colon polyps (2014), Condyloma, Depression, Epilepsy ([place]), Osteopenia (04/2018), and Seizures ([place]).   reports that she has never smoked. She has never used smokeless tobacco.  Past Surgical History:  Procedure Laterality Date  . CATARACT EXTRACTION  03/2015    No Known Allergies   There is no immunization history on file for this patient.  Family History  Problem Relation Age of Onset  . Hypertension Mother   . Diabetes Mother   . Heart disease Mother   . Hypertension Father   . Heart disease Father   . Diabetes Sister   . Hypertension Sister   . Heart disease Sister   . Seizures Neg Hx      Current Facility-Administered Medications:  .  Place/Maintain arterial line, , , Until Discontinued **AND** 0.9 %  sodium chloride infusion, , Intra-arterial, PRN, Nanavati, Ankit, MD .  cefTRIAXone (ROCEPHIN) 1 g in sodium chloride 0.9 % 100 mL IVPB, 1 g, Intravenous, Q24H, Nanavati, Ankit, MD, Stopped at 06/27/19 1614 .  norepinephrine (LEVOPHED) 4mg  in 215mL premix infusion, 0-10 mcg/min, Intravenous, Continuous, Nanavati, Ankit, MD, Last Rate: 37.5 mL/hr at 06/27/19 1726, 10 mcg/min at 06/27/19 1726  Current Outpatient Medications:  .  aspirin 81 MG tablet, Take 81 mg by mouth daily.  , Disp: , Rfl:  .  betamethasone valerate ointment (VALISONE) 0.1 %, Apply  1 application topically 2 (two) times daily., Disp: 30 g, Rfl: 1 .  CRANBERRY PO, Take 2 tablets by mouth daily. , Disp: , Rfl:  .  felbamate (FELBATOL) 600 MG tablet, TAKE 2 TABLETS BY MOUTH 4 TIMES A DAY (Patient taking differently: Take 1,200 mg by mouth 4 (four) times daily. ), Disp: 240 tablet, Rfl: 6 .  levETIRAcetam (KEPPRA) 500 MG tablet, TAKE 1 TABLET BY MOUTH TWICE A DAY (Patient taking differently: Take 500 mg by mouth 2 (two) times daily. ), Disp: 180 tablet, Rfl: 2 .  metroNIDAZOLE (METROGEL) 0.75 % vaginal  gel, Place 1 Applicatorful vaginally 2 (two) times daily., Disp: 70 g, Rfl: 0 .  Multiple Vitamins-Minerals (ICAPS AREDS FORMULA PO), Take by mouth., Disp: , Rfl:  .  topiramate (TOPAMAX) 200 MG tablet, TAKE 1 TABLET BY MOUTH TWICE A DAY (Patient taking differently: Take 200 mg by mouth 2 (two) times daily. ), Disp: 180 tablet, Rfl: 3   Significant Hospital Events   June 27, 2019: Admit  Consults:  x  Procedures:  June 27, 2019: Arterial line planned  Significant Diagnostic Tests:  x  Micro Data:  June 27, 2019: COVID-19: Positive June 27 2019: Urine culture June 27, 2019: Blood culture April 2021: Urine streptococcal and Legionella  Antimicrobials:  June 27, 2019 empiric ceftriaxone  xxx COVID Rx Decadron 4/20 x 10 days (post cortisol) Remdesivir 4/20 x 5 days Toci (not giving pending sepsis evaluation)  Interim history/subjective:  06/27/2019: Seen in emergency room [place] long recess area.  Looks well but reports feeling cold and wanting blankets  Objective   Blood pressure (!) 83/53, pulse (!) 110, temperature 98.3 F (36.8 C), temperature source Oral, resp. rate (!) 22, height 5\' 4"  (1.626 m), weight 75.3 kg, last menstrual period 02/11/2008, SpO2 93 %.        Intake/Output Summary (Last 24 hours) at 06/27/2019 1741 Last data filed at 06/27/2019 1726 Gross per 24 hour  Intake 3600 ml  Output --  Net 3600 ml   Filed Weights   06/27/19 1352  Weight: 75.3 kg    Examination: General: Looks well.  No distress.?  Mildly looks dry  HENT: Has a facemask on on 2 L nasal cannula 94% no neck nodes no elevated JVP Lungs: No distress.  Clear to auscultation bilaterally.  No accessory muscle use. Cardiovascular: Sinus tachycardia heart rate 110 Abdomen: Soft nontender no organomegaly [per the ER doctor that was flank tenderness] Extremities: No cyanosis no clubbing no edema Neuro: Alert and oriented x3.  Speech is normal. GU: Not examined  Resolved Hospital  Problem list   X  Assessment & Plan:  ASSESSMENT / PLAN:   A:  ?  Mild acute hypoxemic respiratory failure secondary to sepsis versus COVID-19 versus both.  Requiring 2 L nasal cannula    P:   Get CT scan of the chest without contrast to evaluate for pulmonary infiltrates consistent with Covid     A:   History of seizures on topiramate Keppra and Felbatol - sees Patent attorney  at [employer] normally at admission. 1-2 seizures at q2 months. Last seizure per BF - 3 weeks ago  P:  (phone consult with Dr Rory Percy neuro) Continue keppra Continue topamaex Hold felbatol for now till we get clarity on safety in renal dysfunction       A:   Circulatory shock requiring low-dose Levophed through the peripheral vein in the ER despite 3 L fluid resuscitation  P:  Place arterial  line to confirm true blood pressure Mean artery pressure greater than 65 Check cortisol If there are escalating Levophed requirements then placed central line    A:   COVID-19.  Likely hypoxemic Possible/likely bacterial sepsis secondary to UTI versus gallbladder issues P:   Get CT chest without contrast Start remdesivir x 5 days Start Decadron x 10 days Hold off Toci given concern for sepsis   - can consider depending on course and PCT results  - check quant gold Discontinue if there is no infiltrate on the CT chest which I doubt Check procalcitonin Empiric ceftriaxone Panculture    A:  Acute kidney injury secondary to volume loss dehydration +/-sepsis P:  Avoid nephrotoxins Mean artery pressure greater than 74 Recheck bmet soon    A:   Gallstones with mild biliary dilatation seen on admit ultrasound 06/27/2019  P:   Check lipase and amylase Get ct abd/pevlis wo contrast Await surgery consultation called by the ER   A:  At risk for anemia of critical illness   P:  - PRBC for hgb </= 6.9gm%    - exceptions are   -  if ACS susepcted/confirmed then transfuse for hgb  </= 8.0gm%,  or    -  active bleeding with hemodynamic instability, then transfuse regardless of hemoglobin value   At at all times try to transfuse 1 unit prbc as possible with exception of active hemorrhage    A At risk for DVT Thrombocytopenia new 06/27/2019   P DVT prophylaxis with SCD Monitor -start  lovenox if stabilizes with plat count   A:   At risk for hypoglycemia and hyperglycemia Rule out relative adrenal insufficiency  P:   Check random cortisol SSI    Best practice:  Diet: N.p.o. Pain/Anxiety/Delirium protocol (if indicated): X VAP protocol (if indicated): Head of bed greater than 30 degrees DVT prophylaxis: scd but start lovenox if platelet improves GI prophylaxis: Protonix Glucose control: SSI Mobility: Bedrest Code Status: Full code Family Communication:   - Boyfriend of 10 years/significant- Anna Genre other updated.  No biologic kids according to the patient  - Advised Mr Bonney Roussel to get covid tested 06/28/19 - he is currentl asymptomatic, Advised him to quarantine for 11-14 days but get all advise from his PCP . If he gets symptomatic advised to d/w PCP about Mab Rx  Disposition: Admit to ICU from the [facility]   The patient Carlynn Knope is critically ill with multiple organ systems failure and requires high complexity decision making for assessment and support, frequent evaluation and titration of therapies, application of advanced monitoring technologies and extensive interpretation of multiple databases.   Critical Care Time devoted to patient care services described in this note is  60  Minutes. This time reflects time of care of this signee Dr Brand Males. This critical care time does not reflect procedure time, or teaching time or supervisory time of PA/NP/Med student/Med Resident etc but could involve care discussion time     Dr. Brand Males, M.D., [facility].C.P Pulmonary and Critical Care Medicine Staff  Physician [facility] Pager: [phone], If no answer or between  15:00h - 7:00h: call [phone]  06/27/2019 5:42 PM    LABS    PULMONARY Recent Labs  Lab 06/27/19 1624  HCO3 13.7*  O2SAT 52.3    CBC Recent Labs  Lab 06/26/19 2114 06/27/19 1349  HGB 13.6  14.2  HCT 42.3 44.0  WBC 4.2 5.9  PLT 280 89*    COAGULATION Recent Labs  Lab 06/27/19 1349  INR 1.7*    CARDIAC  No results for input(s): TROPONINI in the last 168 hours. No results for input(s): PROBNP in the last 168 hours.   CHEMISTRY Recent Labs  Lab 06/26/19 2114 06/27/19 1349  NA 141 141  K 3.7 3.1*  CL 112* 112*  CO2 17* 14*  GLUCOSE 107* 115*  BUN 22 37*  CREATININE 1.44* 3.13*  CALCIUM 9.0 8.9   Estimated Creatinine Clearance: 18 mL/min (A) (by C-G formula based on SCr of 3.13 mg/dL (H)).   LIVER Recent Labs  Lab 06/26/19 2114 06/27/19 1349  AST 29 43*  ALT 23 36  ALKPHOS 132* 200*  BILITOT 0.6 2.1*  PROT 7.1 7.1  ALBUMIN 3.8 3.5  INR  --  1.7*     INFECTIOUS Recent Labs  Lab 06/26/19 2114 06/27/19 1356  LATICACIDVEN 2.4* 4.6*     ENDOCRINE CBG (last 3)  Recent Labs    06/27/19 1358  GLUCAP 94         IMAGING x48h  - image(s) personally visualized  -   highlighted in bold US Aorta  Result Date: 06/27/2019 CLINICAL DATA:  Back pain, weakness, evaluate for abdominal aortic aneurysm EXAM: ULTRASOUND OF ABDOMINAL AORTA TECHNIQUE: Ultrasound examination of the abdominal aorta and proximal common iliac arteries was performed to evaluate for aneurysm. Additional color and Doppler images of the distal aorta were obtained to document patency. COMPARISON:  None. FINDINGS: Abdominal aortic measurements as follows: Proximal:  2.2 cm Mid:  1.8 cm Distal:  1.7 cm Patent: Yes, peak systolic velocity is 61 cm/s Right common iliac artery: Not visualized cm Left common iliac artery: Not visualized cm IMPRESSION: 1.  No  abdominal aortic aneurysm identified. 2. Absence of aortic aneurysm by ultrasound does not exclude aortic dissection, intramural hematoma, or other acute aortic pathology. Consider CT angiogram to further evaluate if there is high clinical concern for aortic pathology in the setting of acute pain. Electronically Signed   By: Eddie Candle M.D.   On: 06/27/2019 17:03   DG Chest Port 1 View  Result Date: 06/27/2019 CLINICAL DATA:  LEFT-sided back pain EXAM: PORTABLE CHEST 1 VIEW COMPARISON:  None. FINDINGS: Normal cardiac silhouette. Mild venous congestion. Prominent azygos fissure. No pneumothorax. No pulmonary edema. No infiltrate. No acute osseous abnormality. IMPRESSION: Central venous congestion. Electronically Signed   By: Suzy Bouchard M.D.   On: 06/27/2019 15:02   US Abdomen Limited RUQ  Result Date: 06/27/2019 CLINICAL DATA:  Right upper quadrant pain. EXAM: ULTRASOUND ABDOMEN LIMITED RIGHT UPPER QUADRANT COMPARISON:  None. FINDINGS: Gallbladder: Multiple gallstones evident, measuring up to 3.3 cm diameter. Gallbladder wall thickness upper normal at 3 mm. No pericholecystic fluid. Sonographer reports no sonographic Murphy sign. Common bile duct: Diameter: 8 mm common duct diameter in the hepatoduodenal ligament. Liver: No focal abnormality evident with subtle increase in parenchymal echotexture suggesting component of fatty deposition. Portal vein is patent on color Doppler imaging with normal direction of blood flow towards the liver. Other: None. IMPRESSION: Cholelithiasis with mild extrahepatic biliary duct distension. Correlation with liver function test recommended. Gallbladder wall thickness upper normal without pericholecystic fluid or sonographic Murphy sign. Electronically Signed   By: Misty Stanley M.D.   On: 06/27/2019 17:07

## 2019-06-27 NOTE — ED Triage Notes (Signed)
Patient stated she had LFT sided back pain yesterday. Patient waited at [facility] ED for 3 hours and decided to leave.  Patient arrives today w/ non specific symptoms. Patient is a poor historian. Patient has low BP.   MD at bedside.

## 2019-06-27 NOTE — Procedures (Signed)
Arterial Catheter Insertion Procedure Note Izza Warbington [id number] 1955/12/26  Procedure: Insertion of Arterial Catheter  Indications: Blood pressure monitoring and Frequent blood sampling  Procedure Details Consent: Risks of procedure as well as the alternatives and risks of each were explained to the (patient/caregiver).  Consent for procedure obtained.   Time Out: Verified patient identification, verified procedure, site/side was marked, verified correct patient position, special equipment/implants available, medications/allergies/relevent history reviewed, required imaging and test results available.  Performed  Maximum sterile technique was used including antiseptics, cap, gloves, gown, hand hygiene, mask and sheet. Skin prep: Chlorhexidine; local anesthetic administered 20 gauge catheter was inserted into left radial artery using the Seldinger technique. ULTRASOUND GUIDANCE USED: NO Evaluation Blood flow good; BP tracing good. Complications: No apparent complications.   Lamonte Sakai 06/27/2019

## 2019-06-27 NOTE — ED Notes (Signed)
Observed pt messing with IV next to A-line. Told pt not to mess with IV line because the medicine was helping to keep her alive. While drawing up medication pt then removed the line. Another IV was started and the Levophed was restarted. When questioned the pt was unable to answer all of the alert and orientation questions. Soft restraints were placed on pt to protect Pt from removing her lines. Pt informed that when she was not as confused and could follow instructions the restraints would be removed. Provider to be updated.

## 2019-06-27 NOTE — Progress Notes (Addendum)
eLink Physician-Brief Progress Note Patient Name: Amber Bowman DOB: 1956/02/23 MRN: [id number]   Date of Service  06/27/2019  HPI/Events of Note  Lactic Acid = 5.0. Last pH = 7.26.   eICU Interventions  Will order: 1. Bolus with LR 1 liter IV over 1 hour now.  2. NaHCO3 100 meq IV now.  3. ABG at 12 midnight.     Intervention Category Major Interventions: Acid-Base disturbance - evaluation and management  Damario Gillie Eugene 06/27/2019, 11:23 PM

## 2019-06-27 NOTE — Progress Notes (Addendum)
Notified bedside nurse of need to draw repeat lactic acid since second lactic was higher than initial.  Margaret Pyle, RN

## 2019-06-27 NOTE — ED Notes (Signed)
Left A-Line placed by RT.

## 2019-06-27 NOTE — Consult Note (Signed)
Reason for Consult:  gallstones Referring Physician: [name] is an 64 y.o. female.  HPI: Pt is a 64 yo F who presented to the ED with n/v/shock and was found to be covid +.  She first started having weakness and back pain.  She then developed n/v/diarrhea.  She then progressed to having abdominal pain.  When she got to ED, she complained more of RUQ pain and RUQ u/s was ordered which showed gallstones.  She is febrile as well.  She denied jaundice.  COVID 19 Antigen was positive.    Past Medical History:  Diagnosis Date  . Colon polyps 2014  . Condyloma   . Depression   . Epilepsy ([place])   . Osteopenia 04/2018   T score -1.8 FRAX 9.5% / 1.1% overall stable from prior DEXA  . Seizures ([place])     Past Surgical History:  Procedure Laterality Date  . CATARACT EXTRACTION  03/2015    Family History  Problem Relation Age of Onset  . Hypertension Mother   . Diabetes Mother   . Heart disease Mother   . Hypertension Father   . Heart disease Father   . Diabetes Sister   . Hypertension Sister   . Heart disease Sister   . Seizures Neg Hx     Social History:  reports that she has never smoked. She has never used smokeless tobacco. She reports that she does not drink alcohol or use drugs.  Allergies: No Known Allergies  Medications:  aspirin 81 MG tablet betamethasone valerate ointment (VALISONE) 0.1 % CRANBERRY PO felbamate (FELBATOL) 600 MG tablet levETIRAcetam (KEPPRA) 500 MG tablet metroNIDAZOLE (METROGEL) 0.75 % vaginal gel Multiple Vitamins-Minerals (ICAPS AREDS FORMULA PO) topiramate (TOPAMAX) 200 MG tablet  Results for orders placed or performed during the hospital encounter of 06/27/19 (from the past 48 hour(s))  Comprehensive metabolic panel     Status: Abnormal   Collection Time: 06/27/19  1:49 PM  Result Value Ref Range   Sodium 141 135 - 145 mmol/L   Potassium 3.1 (L) 3.5 - 5.1 mmol/L   Chloride 112 (H) 98 - 111 mmol/L   CO2 14 (L) 22 - 32 mmol/L    Glucose, Bld 115 (H) 70 - 99 mg/dL    Comment: Glucose reference range applies only to samples taken after fasting for at least 8 hours.   BUN 37 (H) 8 - 23 mg/dL   Creatinine, Ser 3.13 (H) 0.44 - 1.00 mg/dL   Calcium 8.9 8.9 - 10.3 mg/dL   Total Protein 7.1 6.5 - 8.1 g/dL   Albumin 3.5 3.5 - 5.0 g/dL   AST 43 (H) 15 - 41 U/L   ALT 36 0 - 44 U/L   Alkaline Phosphatase 200 (H) 38 - 126 U/L   Total Bilirubin 2.1 (H) 0.3 - 1.2 mg/dL   GFR calc non Af Amer 15 (L) >60 mL/min   GFR calc Af Amer 17 (L) >60 mL/min   Anion gap 15 5 - 15    Comment: Performed at [facility], [place] [address], [place], [place] [address]  CBC WITH DIFFERENTIAL     Status: Abnormal   Collection Time: 06/27/19  1:49 PM  Result Value Ref Range   WBC 5.9 4.0 - 10.5 K/uL   RBC 4.54 3.87 - 5.11 MIL/uL   Hemoglobin 14.2 12.0 - 15.0 g/dL   HCT 44.0 36.0 - 46.0 %   MCV 96.9 80.0 - 100.0 fL   MCH 31.3 26.0 -  34.0 pg   MCHC 32.3 30.0 - 36.0 g/dL   RDW 13.7 11.5 - 15.5 %   Platelets 89 (L) 150 - 400 K/uL    Comment: SPECIMEN CHECKED FOR CLOTS Immature Platelet Fraction may be clinically indicated, consider ordering this additional test [id number] PLATELET COUNT CONFIRMED BY SMEAR REPEATED TO VERIFY    nRBC 0.5 (H) 0.0 - 0.2 %   Neutrophils Relative % 86 %   Neutro Abs 5.0 1.7 - 7.7 K/uL   Lymphocytes Relative 8 %   Lymphs Abs 0.5 (L) 0.7 - 4.0 K/uL   Monocytes Relative 2 %   Monocytes Absolute 0.1 0.1 - 1.0 K/uL   Eosinophils Relative 1 %   Eosinophils Absolute 0.1 0.0 - 0.5 K/uL   Basophils Relative 0 %   Basophils Absolute 0.0 0.0 - 0.1 K/uL   WBC Morphology MILD LEFT SHIFT (1-5% METAS, OCC MYELO, OCC BANDS)     Comment: VACUOLATED NEUTROPHILS   Immature Granulocytes 3 %   Abs Immature Granulocytes 0.19 (H) 0.00 - 0.07 K/uL    Comment: Performed at , [place] [address], [place], [place] [address]  APTT     Status: Abnormal   Collection Time: 06/27/19  1:49 PM   Result Value Ref Range   aPTT 60 (H) 24 - 36 seconds    Comment:        IF BASELINE aPTT IS ELEVATED, SUGGEST PATIENT RISK ASSESSMENT BE USED TO DETERMINE APPROPRIATE ANTICOAGULANT THERAPY. Performed at [facility], [place] [address] [place], [place] [address]   Protime-INR     Status: Abnormal   Collection Time: 06/27/19  1:49 PM  Result Value Ref Range   Prothrombin Time 19.6 (H) 11.4 - 15.2 seconds   INR 1.7 (H) 0.8 - 1.2    Comment: (NOTE) INR goal varies based on device and disease states. Performed at [facility], [place] [address], [place], [place] [address]   Lactic acid, plasma     Status: Abnormal   Collection Time: 06/27/19  1:56 PM  Result Value Ref Range   Lactic Acid, Venous 4.6 (HH) 0.5 - 1.9 mmol/L    Comment: CRITICAL RESULT CALLED TO, READ BACK BY AND VERIFIED WITH: BINGHAM,S. RN @1504  ON 04.20.2021 BY COHEN,K Performed at [facility], [place] [address], [place], [place] [address]   CBG monitoring, ED     Status: None   Collection Time: 06/27/19  1:58 PM  Result Value Ref Range   Glucose-Capillary 94 70 - 99 mg/dL    Comment: Glucose reference range applies only to samples taken after fasting for at least 8 hours.  Respiratory Panel by RT PCR (Flu A&B, Covid) - Nasopharyngeal Swab     Status: None   Collection Time: 06/27/19  2:10 PM   Specimen: Nasopharyngeal Swab  Result Value Ref Range   SARS Coronavirus 2 by RT PCR NEGATIVE NEGATIVE    Comment: (NOTE) SARS-CoV-2 target nucleic acids are NOT DETECTED. The SARS-CoV-2 RNA is generally detectable in upper respiratoy specimens during the acute phase of infection. The lowest concentration of SARS-CoV-2 viral copies this assay can detect is 131 copies/mL. A negative result does not preclude SARS-Cov-2 infection and should not be used as the sole basis for treatment or other patient management decisions. A negative result may occur with  improper specimen  collection/handling, submission of specimen other than nasopharyngeal swab, presence of viral mutation(s) within the areas targeted by this assay, and inadequate number of  viral copies (<131 copies/mL). A negative result must be combined with clinical observations, patient history, and epidemiological information. The expected result is Negative. Fact Sheet for Patients:  PinkCheek.be Fact Sheet for Healthcare Providers:  GravelBags.it This test is not yet ap proved or cleared by the [place] FDA and  has been authorized for detection and/or diagnosis of SARS-CoV-2 by FDA under an Emergency Use Authorization (EUA). This EUA will remain  in effect (meaning this test can be used) for the duration of the COVID-19 declaration under Section 564(b)(1) of the Act, 21 U.S.C. section 360bbb-3(b)(1), unless the authorization is terminated or revoked sooner.    Influenza A by PCR NEGATIVE NEGATIVE   Influenza B by PCR NEGATIVE NEGATIVE    Comment: (NOTE) The Xpert Xpress SARS-CoV-2/FLU/RSV assay is intended as an aid in  the diagnosis of influenza from Nasopharyngeal swab specimens and  should not be used as a sole basis for treatment. Nasal washings and  aspirates are unacceptable for Xpert Xpress SARS-CoV-2/FLU/RSV  testing. Fact Sheet for Patients: PinkCheek.be Fact Sheet for Healthcare Providers: GravelBags.it This test is not yet approved or cleared by the [place] FDA and  has been authorized for detection and/or diagnosis of SARS-CoV-2 by  FDA under an Emergency Use Authorization (EUA). This EUA will remain  in effect (meaning this test can be used) for the duration of the  Covid-19 declaration under Section 564(b)(1) of the Act, 21  U.S.C. section 360bbb-3(b)(1), unless the authorization is  terminated or revoked. Performed at [facility],  [place] [address] [place], [place] [address]   POC SARS Coronavirus 2 Ag-ED - Nasal Swab (BD Veritor Kit)     Status: Abnormal   Collection Time: 06/27/19  2:52 PM  Result Value Ref Range   SARS Coronavirus 2 Ag POSITIVE (A) NEGATIVE    Comment: (NOTE) SARS-CoV-2 antigen PRESENT. Positive results indicate the presence of viral antigens, but clinical correlation with patient history and other diagnostic information is necessary to determine patient infection status.  Positive results do not rule out bacterial infection or co-infection  with other viruses. False positive results are rare but can occur, and confirmatory RT-PCR testing may be appropriate in some circumstances. The expected result is Negative. Fact Sheet for Patients: PodPark.tn Fact Sheet for Providers: GiftContent.is  This test is not yet approved or cleared by the [place] FDA and  has been authorized for detection and/or diagnosis of SARS-CoV-2 by FDA under an Emergency Use Authorization (EUA).  This EUA will remain in effect (meaning this test can be used) for the duration of  the COVID-19 declaration under Section 564(b)(1) of the Act, 21 U.S.C. section 360bbb-3(b)(1), unless the a uthorization is terminated or revoked sooner.   Lactic acid, plasma     Status: Abnormal   Collection Time: 06/27/19  4:20 PM  Result Value Ref Range   Lactic Acid, Venous 5.0 (HH) 0.5 - 1.9 mmol/L    Comment: CRITICAL VALUE NOTED.  VALUE IS CONSISTENT WITH PREVIOUSLY REPORTED AND CALLED VALUE. Performed at [facility], [place] [address] [place], [place] [address]   Type and screen [place]     Status: None   Collection Time: 06/27/19  4:20 PM  Result Value Ref Range   ABO/RH(D) O POS    Antibody Screen NEG    Sample Expiration      06/30/2019,2359 Performed at [facility], [place] [address], [place], [place]  [address]   ABO/Rh  Status: None   Collection Time: 06/27/19  4:20 PM  Result Value Ref Range   ABO/RH(D)      O POS Performed at [facility], [place] [address], [place], [place] [address]   Blood gas, venous ([facility], [facility], [facility])     Status: Abnormal   Collection Time: 06/27/19  4:24 PM  Result Value Ref Range   pH, Ven 7.263 7.250 - 7.430   pCO2, Ven 31.3 (L) 44.0 - 60.0 mmHg   pO2, Ven 32.3 32.0 - 45.0 mmHg   Bicarbonate 13.7 (L) 20.0 - 28.0 mmol/L   Acid-base deficit 11.9 (H) 0.0 - 2.0 mmol/L   O2 Saturation 52.3 %   Patient temperature 98.6     Comment: Performed at [facility], [place] [address], [place], [place] [address]  Lipase, blood     Status: None   Collection Time: 06/27/19  7:04 PM  Result Value Ref Range   Lipase 21 11 - 51 U/L    Comment: Performed at [facility], [place] [address] [place], [place] [address]  Procalcitonin - Baseline     Status: None   Collection Time: 06/27/19  7:04 PM  Result Value Ref Range   Procalcitonin 83.55 ng/mL    Comment:        Interpretation: PCT >= 10 ng/mL: Important systemic inflammatory response, almost exclusively due to severe bacterial sepsis or septic shock. (NOTE)       Sepsis PCT Algorithm           Lower Respiratory Tract                                      Infection PCT Algorithm    ----------------------------     ----------------------------         PCT < 0.25 ng/mL                PCT < 0.10 ng/mL         Strongly encourage             Strongly discourage   discontinuation of antibiotics    initiation of antibiotics    ----------------------------     -----------------------------       PCT 0.25 - 0.50 ng/mL            PCT 0.10 - 0.25 ng/mL               OR       >80% decrease in PCT            Discourage initiation of                                            antibiotics      Encourage discontinuation           of antibiotics    ----------------------------      -----------------------------         PCT >= 0.50 ng/mL              PCT 0.26 - 0.50 ng/mL                AND       <80% decrease in PCT             Encourage  initiation of                                             antibiotics       Encourage continuation           of antibiotics    ----------------------------     -----------------------------        PCT >= 0.50 ng/mL                  PCT > 0.50 ng/mL               AND         increase in PCT                  Strongly encourage                                      initiation of antibiotics    Strongly encourage escalation           of antibiotics                                     -----------------------------                                           PCT <= 0.25 ng/mL                                                 OR                                        > 80% decrease in PCT                                     Discontinue / Do not initiate                                             antibiotics Performed at [place] [address] [place], [place] [address]   DIC (disseminated intravasc coag) panel     Status: Abnormal   Collection Time: 06/27/19  7:04 PM  Result Value Ref Range   Prothrombin Time 19.1 (H) 11.4 - 15.2 seconds   INR 1.6 (H) 0.8 - 1.2    Comment: (NOTE) INR goal varies based on device and disease states.    aPTT 51 (H) 24 - 36 seconds    Comment:        IF BASELINE aPTT IS ELEVATED, SUGGEST PATIENT RISK ASSESSMENT BE USED TO DETERMINE APPROPRIATE ANTICOAGULANT THERAPY.    Fibrinogen 182 (L) 210 - 475 mg/dL   D-Dimer, Quant >20.00 (H) 0.00 - 0.50  ug/mL-FEU    Comment: (NOTE) At the manufacturer cut-off of 0.50 ug/mL FEU, this assay has been documented to exclude PE with a sensitivity and negative predictive value of 97 to 99%.  At this time, this assay has not been approved by the FDA to exclude DVT/VTE. Results should be correlated with clinical presentation.    Platelets  80 (L) 150 - 400 K/uL    Comment: SPECIMEN CHECKED FOR CLOTS Immature Platelet Fraction may be clinically indicated, consider ordering this additional test [id number] PLATELET COUNT CONFIRMED BY SMEAR REPEATED TO VERIFY    Smear Review NO SCHISTOCYTES SEEN     Comment: Performed at [facility], [place] [address] [place], [place] [address]  Amylase     Status: None   Collection Time: 06/27/19  7:04 PM  Result Value Ref Range   Amylase 53 28 - 100 U/L    Comment: Performed at [facility], [place] [address], [place], [place] [address]  Lactate dehydrogenase     Status: Abnormal   Collection Time: 06/27/19  7:04 PM  Result Value Ref Range   LDH 398 (H) 98 - 192 U/L    Comment: Performed at [facility], [place] [address], [place], [place] [address]  Comprehensive metabolic panel     Status: Abnormal   Collection Time: 06/27/19  7:04 PM  Result Value Ref Range   Sodium 142 135 - 145 mmol/L   Potassium 3.0 (L) 3.5 - 5.1 mmol/L   Chloride 113 (H) 98 - 111 mmol/L   CO2 14 (L) 22 - 32 mmol/L   Glucose, Bld 93 70 - 99 mg/dL    Comment: Glucose reference range applies only to samples taken after fasting for at least 8 hours.   BUN 38 (H) 8 - 23 mg/dL   Creatinine, Ser 3.10 (H) 0.44 - 1.00 mg/dL   Calcium 7.8 (L) 8.9 - 10.3 mg/dL   Total Protein 5.4 (L) 6.5 - 8.1 g/dL   Albumin 2.6 (L) 3.5 - 5.0 g/dL   AST 36 15 - 41 U/L   ALT 27 0 - 44 U/L   Alkaline Phosphatase 116 38 - 126 U/L   Total Bilirubin 1.2 0.3 - 1.2 mg/dL   GFR calc non Af Amer 15 (L) >60 mL/min   GFR calc Af Amer 18 (L) >60 mL/min   Anion gap 15 5 - 15    Comment: Performed at [facility], [place] [address], [place], [place] [address]  C-reactive protein     Status: Abnormal   Collection Time: 06/27/19  7:04 PM  Result Value Ref Range   CRP 22.0 (H) <1.0 mg/dL    Comment: Performed at [facility], [place] [address], [place], [place] [address]   Cortisol     Status: None   Collection Time: 06/27/19  7:04 PM  Result Value Ref Range   Cortisol, Plasma 38.5 ug/dL    Comment: (NOTE) AM    6.7 - 22.6 ug/dL PM   <10.0       ug/dL Performed at [place] [address] [place], [place] [address]   MRSA PCR Screening     Status: Abnormal   Collection Time: 06/27/19 10:54 PM   Specimen: Nasal Mucosa; Nasopharyngeal  Result Value Ref Range   MRSA by PCR POSITIVE (A) NEGATIVE    Comment:        The GeneXpert MRSA Assay (FDA approved for NASAL specimens only), is one  component of a comprehensive MRSA colonization surveillance program. It is not intended to diagnose MRSA infection nor to guide or monitor treatment for MRSA infections. RESULT CALLED TO, READ BACK BY AND VERIFIED WITH: JOHNSON,H @ 0050 ON [id number] BY POTEAT,S Performed at [facility], [place] [address], [place], [place] [address]   Glucose, capillary     Status: None   Collection Time: 06/27/19 11:30 PM  Result Value Ref Range   Glucose-Capillary 82 70 - 99 mg/dL    Comment: Glucose reference range applies only to samples taken after fasting for at least 8 hours.  Blood gas, arterial     Status: Abnormal   Collection Time: 06/27/19 11:38 PM  Result Value Ref Range   pH, Arterial 7.393 7.350 - 7.450   pCO2 arterial 18.5 (LL) 32.0 - 48.0 mmHg    Comment: CRITICAL RESULT CALLED TO, READ BACK BY AND VERIFIED WITH: BARBARA MAY @ 0032 ON 06/28/19 C VARNER    pO2, Arterial 130 (H) 83.0 - 108.0 mmHg   Bicarbonate 11.2 (L) 20.0 - 28.0 mmol/L   Acid-base deficit 12.1 (H) 0.0 - 2.0 mmol/L   O2 Saturation 98.2 %   Patient temperature 96.7     Comment: Performed at [facility], [place] [address] [place], [place] [address]  Glucose, capillary     Status: Abnormal   Collection Time: 06/28/19  4:06 AM  Result Value Ref Range   Glucose-Capillary 68 (L) 70 - 99 mg/dL    Comment: Glucose reference range applies only to samples  taken after fasting for at least 8 hours.  Glucose, capillary     Status: Abnormal   Collection Time: 06/28/19  4:37 AM  Result Value Ref Range   Glucose-Capillary 187 (H) 70 - 99 mg/dL    Comment: Glucose reference range applies only to samples taken after fasting for at least 8 hours.  Procalcitonin     Status: None   Collection Time: 06/28/19  5:49 AM  Result Value Ref Range   Procalcitonin 71.36 ng/mL    Comment:        Interpretation: PCT >= 10 ng/mL: Important systemic inflammatory response, almost exclusively due to severe bacterial sepsis or septic shock. (NOTE)       Sepsis PCT Algorithm           Lower Respiratory Tract                                      Infection PCT Algorithm    ----------------------------     ----------------------------         PCT < 0.25 ng/mL                PCT < 0.10 ng/mL         Strongly encourage             Strongly discourage   discontinuation of antibiotics    initiation of antibiotics    ----------------------------     -----------------------------       PCT 0.25 - 0.50 ng/mL            PCT 0.10 - 0.25 ng/mL               OR       >80% decrease in PCT            Discourage initiation of  antibiotics      Encourage discontinuation           of antibiotics    ----------------------------     -----------------------------         PCT >= 0.50 ng/mL              PCT 0.26 - 0.50 ng/mL                AND       <80% decrease in PCT             Encourage initiation of                                             antibiotics       Encourage continuation           of antibiotics    ----------------------------     -----------------------------        PCT >= 0.50 ng/mL                  PCT > 0.50 ng/mL               AND         increase in PCT                  Strongly encourage                                      initiation of antibiotics    Strongly encourage escalation           of antibiotics                                      -----------------------------                                           PCT <= 0.25 ng/mL                                                 OR                                        > 80% decrease in PCT                                     Discontinue / Do not initiate                                             antibiotics Performed at [place] [address], [place], [place] [address]   CBC with Differential/Platelet     Status: Abnormal   Collection Time:  06/28/19  5:49 AM  Result Value Ref Range   WBC 20.2 (H) 4.0 - 10.5 K/uL   RBC 3.52 (L) 3.87 - 5.11 MIL/uL   Hemoglobin 10.9 (L) 12.0 - 15.0 g/dL   HCT 33.5 (L) 36.0 - 46.0 %   MCV 95.2 80.0 - 100.0 fL   MCH 31.0 26.0 - 34.0 pg   MCHC 32.5 30.0 - 36.0 g/dL   RDW 14.3 11.5 - 15.5 %   Platelets 67 (L) 150 - 400 K/uL    Comment: CONSISTENT WITH PREVIOUS RESULT Immature Platelet Fraction may be clinically indicated, consider ordering this additional test [id number]    nRBC 0.0 0.0 - 0.2 %   Neutrophils Relative % 80 %   Neutro Abs 16.0 (H) 1.7 - 7.7 K/uL   Lymphocytes Relative 8 %   Lymphs Abs 1.7 0.7 - 4.0 K/uL   Monocytes Relative 7 %   Monocytes Absolute 1.5 (H) 0.1 - 1.0 K/uL   Eosinophils Relative 0 %   Eosinophils Absolute 0.1 0.0 - 0.5 K/uL   Basophils Relative 0 %   Basophils Absolute 0.1 0.0 - 0.1 K/uL   WBC Morphology MILD LEFT SHIFT (1-5% METAS, OCC MYELO, OCC BANDS)     Comment: VACUOLATED NEUTROPHILS   Immature Granulocytes 5 %   Abs Immature Granulocytes 0.90 (H) 0.00 - 0.07 K/uL    Comment: Performed at [facility], [place] [address], [place], [place] [address]  Comprehensive metabolic panel     Status: Abnormal   Collection Time: 06/28/19  5:49 AM  Result Value Ref Range   Sodium 141 135 - 145 mmol/L   Potassium 3.8 3.5 - 5.1 mmol/L    Comment: DELTA CHECK NOTED NO VISIBLE HEMOLYSIS    Chloride 110 98 - 111 mmol/L   CO2 14 (L) 22  - 32 mmol/L   Glucose, Bld 97 70 - 99 mg/dL    Comment: Glucose reference range applies only to samples taken after fasting for at least 8 hours.   BUN 49 (H) 8 - 23 mg/dL   Creatinine, Ser 3.80 (H) 0.44 - 1.00 mg/dL   Calcium 7.4 (L) 8.9 - 10.3 mg/dL   Total Protein 5.5 (L) 6.5 - 8.1 g/dL   Albumin 2.6 (L) 3.5 - 5.0 g/dL   AST 33 15 - 41 U/L   ALT 26 0 - 44 U/L   Alkaline Phosphatase 92 38 - 126 U/L   Total Bilirubin 0.8 0.3 - 1.2 mg/dL   GFR calc non Af Amer 12 (L) >60 mL/min   GFR calc Af Amer 14 (L) >60 mL/min   Anion gap 17 (H) 5 - 15    Comment: Performed at [facility], [place] [address], [place], [place] [address]  Magnesium     Status: Abnormal   Collection Time: 06/28/19  5:49 AM  Result Value Ref Range   Magnesium 1.3 (L) 1.7 - 2.4 mg/dL    Comment: Performed at [facility], [place] [address] [place], [place] [address]  D-dimer, quantitative (not at [facility])     Status: Abnormal   Collection Time: 06/28/19  5:49 AM  Result Value Ref Range   D-Dimer, Quant >20.00 (H) 0.00 - 0.50 ug/mL-FEU    Comment: (NOTE) At the manufacturer cut-off of 0.50 ug/mL FEU, this assay has been documented to exclude PE with a sensitivity and negative predictive value of 97 to 99%.  At this time, this assay has not been approved by the FDA to  exclude DVT/VTE. Results should be correlated with clinical presentation. Performed at [facility], [place] [address], [place], [place] [address]   Phosphorus     Status: None   Collection Time: 06/28/19  5:49 AM  Result Value Ref Range   Phosphorus 4.0 2.5 - 4.6 mg/dL    Comment: Performed at [facility], [place] [address] [place], [place] [address]  Lactic acid, plasma     Status: Abnormal   Collection Time: 06/28/19  5:49 AM  Result Value Ref Range   Lactic Acid, Venous 4.5 (HH) 0.5 - 1.9 mmol/L    Comment: CRITICAL VALUE NOTED.  VALUE IS CONSISTENT WITH PREVIOUSLY REPORTED AND  CALLED VALUE. Performed at [facility], [place] [address], [place], [place] [address]   Glucose, capillary     Status: None   Collection Time: 06/28/19  7:37 AM  Result Value Ref Range   Glucose-Capillary 77 70 - 99 mg/dL    Comment: Glucose reference range applies only to samples taken after fasting for at least 8 hours.    CT ABDOMEN PELVIS WO CONTRAST  Result Date: 06/27/2019 CLINICAL DATA:  Sepsis EXAM: CT CHEST, ABDOMEN AND PELVIS WITHOUT CONTRAST TECHNIQUE: Multidetector CT imaging of the chest, abdomen and pelvis was performed following the standard protocol without IV contrast. COMPARISON:  None. FINDINGS: CT CHEST FINDINGS Cardiovascular: Heart size is normal. There are atherosclerotic calcifications of the thoracic aorta. Mediastinum/Nodes: Small hiatal hernia. No mediastinal or axillary lymphadenopathy. Lungs/Pleura: Small pleural effusions with basilar atelectasis. No other consolidation. There is an incidentally noted azygos fissure. Central airways are patent. Musculoskeletal: No chest wall mass or suspicious bone lesions identified. CT ABDOMEN PELVIS FINDINGS HEPATOBILIARY: Normal hepatic contours. No intra- or extrahepatic biliary dilatation. There is cholelithiasis with a hydropic gallbladder. No evidence of acute inflammation. PANCREAS: Normal pancreas. No ductal dilatation or peripancreatic fluid collection. SPLEEN: Normal. ADRENALS/URINARY TRACT: The adrenal glands are normal. There is a stone within the proximal left ureter measuring 10 mm, causing mild hydroureteronephrosis and mild perinephric stranding. Additionally, there are multiple bilateral nonobstructing renal calculi that measure up to 6 mm. The urinary bladder is normal for degree of distention STOMACH/BOWEL: There is a small hiatal hernia. Normal duodenal course and caliber. No small bowel dilatation or inflammation. No focal colonic abnormality. Normal appendix. VASCULAR/LYMPHATIC: There is calcific  atherosclerosis of the abdominal aorta. No abdominal or pelvic lymphadenopathy. REPRODUCTIVE: Normal uterus and ovaries. MUSCULOSKELETAL. No bony spinal canal stenosis or focal osseous abnormality. OTHER: None. IMPRESSION: 1. Left-sided obstructive uropathy with 10 mm stone in the proximal left ureter causing mild hydroureteronephrosis and perinephric stranding. 2. Multiple bilateral nonobstructing renal calculi measuring up to 6 mm. 3. Small pleural effusions with basilar atelectasis. 4. Cholelithiasis with hydropic gallbladder. No evidence of acute inflammation. 5. Aortic Atherosclerosis (ICD10-I70.0). Electronically Signed   By: Ulyses Jarred M.D.   On: 06/27/2019 22:09   CT CHEST WO CONTRAST  Result Date: 06/27/2019 CLINICAL DATA:  Sepsis EXAM: CT CHEST, ABDOMEN AND PELVIS WITHOUT CONTRAST TECHNIQUE: Multidetector CT imaging of the chest, abdomen and pelvis was performed following the standard protocol without IV contrast. COMPARISON:  None. FINDINGS: CT CHEST FINDINGS Cardiovascular: Heart size is normal. There are atherosclerotic calcifications of the thoracic aorta. Mediastinum/Nodes: Small hiatal hernia. No mediastinal or axillary lymphadenopathy. Lungs/Pleura: Small pleural effusions with basilar atelectasis. No other consolidation. There is an incidentally noted azygos fissure. Central airways are patent. Musculoskeletal: No chest wall mass or suspicious bone lesions identified. CT ABDOMEN PELVIS  FINDINGS HEPATOBILIARY: Normal hepatic contours. No intra- or extrahepatic biliary dilatation. There is cholelithiasis with a hydropic gallbladder. No evidence of acute inflammation. PANCREAS: Normal pancreas. No ductal dilatation or peripancreatic fluid collection. SPLEEN: Normal. ADRENALS/URINARY TRACT: The adrenal glands are normal. There is a stone within the proximal left ureter measuring 10 mm, causing mild hydroureteronephrosis and mild perinephric stranding. Additionally, there are multiple bilateral  nonobstructing renal calculi that measure up to 6 mm. The urinary bladder is normal for degree of distention STOMACH/BOWEL: There is a small hiatal hernia. Normal duodenal course and caliber. No small bowel dilatation or inflammation. No focal colonic abnormality. Normal appendix. VASCULAR/LYMPHATIC: There is calcific atherosclerosis of the abdominal aorta. No abdominal or pelvic lymphadenopathy. REPRODUCTIVE: Normal uterus and ovaries. MUSCULOSKELETAL. No bony spinal canal stenosis or focal osseous abnormality. OTHER: None. IMPRESSION: 1. Left-sided obstructive uropathy with 10 mm stone in the proximal left ureter causing mild hydroureteronephrosis and perinephric stranding. 2. Multiple bilateral nonobstructing renal calculi measuring up to 6 mm. 3. Small pleural effusions with basilar atelectasis. 4. Cholelithiasis with hydropic gallbladder. No evidence of acute inflammation. 5. Aortic Atherosclerosis (ICD10-I70.0). Electronically Signed   By: Ulyses Jarred M.D.   On: 06/27/2019 22:09   US Aorta  Result Date: 06/27/2019 CLINICAL DATA:  Back pain, weakness, evaluate for abdominal aortic aneurysm EXAM: ULTRASOUND OF ABDOMINAL AORTA TECHNIQUE: Ultrasound examination of the abdominal aorta and proximal common iliac arteries was performed to evaluate for aneurysm. Additional color and Doppler images of the distal aorta were obtained to document patency. COMPARISON:  None. FINDINGS: Abdominal aortic measurements as follows: Proximal:  2.2 cm Mid:  1.8 cm Distal:  1.7 cm Patent: Yes, peak systolic velocity is 61 cm/s Right common iliac artery: Not visualized cm Left common iliac artery: Not visualized cm IMPRESSION: 1.  No abdominal aortic aneurysm identified. 2. Absence of aortic aneurysm by ultrasound does not exclude aortic dissection, intramural hematoma, or other acute aortic pathology. Consider CT angiogram to further evaluate if there is high clinical concern for aortic pathology in the setting of acute pain.  Electronically Signed   By: Eddie Candle M.D.   On: 06/27/2019 17:03   DG Chest Port 1 View  Result Date: 06/27/2019 CLINICAL DATA:  LEFT-sided back pain EXAM: PORTABLE CHEST 1 VIEW COMPARISON:  None. FINDINGS: Normal cardiac silhouette. Mild venous congestion. Prominent azygos fissure. No pneumothorax. No pulmonary edema. No infiltrate. No acute osseous abnormality. IMPRESSION: Central venous congestion. Electronically Signed   By: Suzy Bouchard M.D.   On: 06/27/2019 15:02   US Abdomen Limited RUQ  Result Date: 06/27/2019 CLINICAL DATA:  Right upper quadrant pain. EXAM: ULTRASOUND ABDOMEN LIMITED RIGHT UPPER QUADRANT COMPARISON:  None. FINDINGS: Gallbladder: Multiple gallstones evident, measuring up to 3.3 cm diameter. Gallbladder wall thickness upper normal at 3 mm. No pericholecystic fluid. Sonographer reports no sonographic Murphy sign. Common bile duct: Diameter: 8 mm common duct diameter in the hepatoduodenal ligament. Liver: No focal abnormality evident with subtle increase in parenchymal echotexture suggesting component of fatty deposition. Portal vein is patent on color Doppler imaging with normal direction of blood flow towards the liver. Other: None. IMPRESSION: Cholelithiasis with mild extrahepatic biliary duct distension. Correlation with liver function test recommended. Gallbladder wall thickness upper normal without pericholecystic fluid or sonographic Murphy sign. Electronically Signed   By: Misty Stanley M.D.   On: 06/27/2019 17:07    Review of Systems  Constitutional: Positive for chills and fever.  HENT: Negative.   Eyes: Negative.   Cardiovascular: Negative.  Gastrointestinal: Positive for diarrhea, nausea and vomiting.  Endocrine: Negative.   Genitourinary: Positive for difficulty urinating, flank pain and hematuria.  Skin: Negative.   Allergic/Immunologic: Negative.   Neurological: Positive for seizures (history of, well controlled.).  Hematological: Negative.    Psychiatric/Behavioral: Negative.   All other systems reviewed and are negative.     Blood pressure (!) 84/61, pulse (!) 112, temperature (!) 97.5 F (36.4 C), temperature source Oral, resp. rate (!) 26, height 5' 4"  (1.626 m), weight 75.3 kg, last menstrual period 02/11/2008, SpO2 93 %.   Physical Exam  Constitutional: She is oriented to person, place, and time. She appears well-developed and well-nourished. She appears distressed.  HENT:  Head: Normocephalic and atraumatic.  Right Ear: External ear normal.  Left Ear: External ear normal.  Nose: Nose normal.  Mouth/Throat: Oropharynx is clear and moist. No oropharyngeal exudate.  Eyes: Pupils are equal, round, and reactive to light. Conjunctivae are normal. Right eye exhibits no discharge. Left eye exhibits no discharge. No scleral icterus.  Neck: No JVD present. No tracheal deviation present. No thyromegaly present.  Cardiovascular: Normal rate, regular rhythm and intact distal pulses.  Respiratory: Effort normal. No stridor. No respiratory distress. She exhibits no tenderness.  GI: Soft. She exhibits distension. She exhibits no mass. There is no abdominal tenderness. There is no rebound and no guarding.  No hepatosplenomegaly  Musculoskeletal:        General: No tenderness, deformity or edema. Normal range of motion.     Cervical back: Normal range of motion and neck supple.  Lymphadenopathy:    She has no cervical adenopathy.  Neurological: She is alert and oriented to person, place, and time. No cranial nerve deficit. Coordination normal.  Skin: Skin is warm and dry. No rash noted. She is not diaphoretic. No erythema. There is pallor.  Psychiatric: She has a normal mood and affect. Her behavior is normal. Judgment and thought content normal.     Assessment/Plan: Septic shock UTI with obstructing ureteral stone ARF Gallstones. Mildly elevated Alk phos and T bili- now normalized COVID 19  This patient appears to have  symptoms consistent with ureteral stone and covid given sepsis and n/v/d.  She continues to have n/v/d and NO abdominal pain.  Back pain has improved as well.    She may need elective gallbladder surgery in the future, but current illness appears to be unrelated to the gallbladder.    No need for surgical intervention at this time.    Let us know if more concerns arise regarding the gallbladder.     Amber Bowman 06/28/2019, 8:08 AM

## 2019-06-27 NOTE — ED Provider Notes (Addendum)
[place] DEPT Provider Note   CSN: [id number] Arrival date & time: 06/27/19  1301     History Chief Complaint  Patient presents with  . Weakness  . Back Pain    Amber Bowman is a 64 y.o. female.  HPI     64 year old female comes in a chief complaint of weakness and back pain. Patient has history of seizures.  She reports that she started feeling unwell 2 or 3 days ago.  Her symptoms are described as generalized weakness.  She went to the ER at [facility] yesterday, after waiting for 3 hours she left.  However she did not feel great overnight and continued to feel weak therefore she decided to come back to the ER.  Patient was noted to be hypotensive, and she was assessed by [place] immediately.  She reports that she started having some nausea and vomiting yesterday.  She also had loose bowel movements.  She had 3-5 episodes of each, nonbloody.  She denies any melena.  She did take that there was bilious emesis.   She is also complaining of abdominal pain that is in the epigastric and right upper quadrant region and flank pain.  She has had no fevers but reports chills.  She suspects that she has a UTI.  She has not been around anyone with COVID-19.  Past Medical History:  Diagnosis Date  . Colon polyps 2014  . Condyloma   . Depression   . Epilepsy ([place])   . Osteopenia 04/2018   T score -1.8 FRAX 9.5% / 1.1% overall stable from prior DEXA  . Seizures [facility])     Patient Active Problem List   Diagnosis Date Noted  . Partial epilepsy with impairment of consciousness, intractable ([place]) 11/03/2013  . Condyloma   . Epilepsy ([place])   . Depression     Past Surgical History:  Procedure Laterality Date  . CATARACT EXTRACTION  03/2015     OB History    Gravida  0   Para      Term      Preterm      AB      Living        SAB      TAB      Ectopic      Multiple      Live Births              Family History  Problem Relation  Age of Onset  . Hypertension Mother   . Diabetes Mother   . Heart disease Mother   . Hypertension Father   . Heart disease Father   . Diabetes Sister   . Hypertension Sister   . Heart disease Sister   . Seizures Neg Hx     Social History   Tobacco Use  . Smoking status: Never Smoker  . Smokeless tobacco: Never Used  Substance Use Topics  . Alcohol use: No    Alcohol/week: 0.0 standard drinks  . Drug use: No    Home Medications Prior to Admission medications   Medication Sig Start Date End Date Taking? Authorizing Provider  aspirin 81 MG tablet Take 81 mg by mouth daily.      [provider]  betamethasone valerate ointment (VALISONE) 0.1 % Apply 1 application topically 2 (two) times daily. 05/26/19   Princess Bruins, MD  CRANBERRY PO Take 2 tablets by mouth daily.     [provider]  felbamate (FELBATOL) 600 MG tablet  TAKE 2 TABLETS BY MOUTH 4 TIMES A DAY Patient taking differently: Take 1,200 mg by mouth 4 (four) times daily.  06/07/19   Ward Givens, NP  levETIRAcetam (KEPPRA) 500 MG tablet TAKE 1 TABLET BY MOUTH TWICE A DAY Patient taking differently: Take 500 mg by mouth 2 (two) times daily.  03/27/19   Ward Givens, NP  metroNIDAZOLE (METROGEL) 0.75 % vaginal gel Place 1 Applicatorful vaginally 2 (two) times daily. 05/26/19   Princess Bruins, MD  Multiple Vitamins-Minerals (ICAPS AREDS FORMULA PO) Take by mouth.    [provider]  topiramate (TOPAMAX) 200 MG tablet TAKE 1 TABLET BY MOUTH TWICE A DAY Patient taking differently: Take 200 mg by mouth 2 (two) times daily.  04/14/19   Ward Givens, NP    Allergies    Patient has no known allergies.  Review of Systems   Review of Systems  Constitutional: Positive for activity change, chills and fatigue.  Respiratory: Negative for cough and shortness of breath.   Cardiovascular: Negative for chest pain.  Gastrointestinal: Positive for nausea and vomiting.  Genitourinary: Positive for  flank pain.  Allergic/Immunologic: Negative for immunocompromised state.  Neurological: Positive for dizziness and light-headedness.  All other systems reviewed and are negative.   Physical Exam Updated Vital Signs BP (!) 89/60   Pulse (!) 110   Temp 98.3 F (36.8 C) (Oral)   Resp (!) 26   Ht 5' 4"  (1.626 m)   Wt 75.3 kg   LMP 02/11/2008   SpO2 95%   BMI 28.49 kg/m   Physical Exam Vitals and nursing note reviewed.  Constitutional:      Appearance: She is well-developed.  HENT:     Head: Normocephalic and atraumatic.  Cardiovascular:     Rate and Rhythm: Tachycardia present.  Pulmonary:     Effort: Pulmonary effort is normal.  Abdominal:     General: Bowel sounds are normal.     Tenderness: There is abdominal tenderness. There is no guarding or rebound.     Comments: Right upper quadrant tenderness without Murphy's.  Patient also has epigastric tenderness  Musculoskeletal:     Cervical back: Normal range of motion and neck supple.  Skin:    General: Skin is warm and dry.  Neurological:     Mental Status: She is alert and oriented to person, place, and time.     ED Results / Procedures / Treatments   Labs (all labs ordered are listed, but only abnormal results are displayed) Labs Reviewed  LACTIC ACID, PLASMA - Abnormal; Notable for the following components:      Result Value   Lactic Acid, Venous 4.6 (*)    All other components within normal limits  COMPREHENSIVE METABOLIC PANEL - Abnormal; Notable for the following components:   Potassium 3.1 (*)    Chloride 112 (*)    CO2 14 (*)    Glucose, Bld 115 (*)    BUN 37 (*)    Creatinine, Ser 3.13 (*)    AST 43 (*)    Alkaline Phosphatase 200 (*)    Total Bilirubin 2.1 (*)    GFR calc non Af Amer 15 (*)    GFR calc Af Amer 17 (*)    All other components within normal limits  CBC WITH DIFFERENTIAL/PLATELET - Abnormal; Notable for the following components:   Platelets 89 (*)    nRBC 0.5 (*)    Lymphs Abs 0.5  (*)    Abs Immature Granulocytes 0.19 (*)  All other components within normal limits  APTT - Abnormal; Notable for the following components:   aPTT 60 (*)    All other components within normal limits  PROTIME-INR - Abnormal; Notable for the following components:   Prothrombin Time 19.6 (*)    INR 1.7 (*)    All other components within normal limits  BLOOD GAS, VENOUS - Abnormal; Notable for the following components:   pCO2, Ven 31.3 (*)    Bicarbonate 13.7 (*)    Acid-base deficit 11.9 (*)    All other components within normal limits  POC SARS CORONAVIRUS 2 AG -  ED - Abnormal; Notable for the following components:   SARS Coronavirus 2 Ag POSITIVE (*)    All other components within normal limits  RESPIRATORY PANEL BY RT PCR (FLU A&B, COVID)  CULTURE, BLOOD (ROUTINE X 2)  CULTURE, BLOOD (ROUTINE X 2)  URINE CULTURE  LACTIC ACID, PLASMA  URINALYSIS, ROUTINE W REFLEX MICROSCOPIC  LIPASE, BLOOD  CBG MONITORING, ED  CBG MONITORING, ED  TYPE AND SCREEN    EKG EKG Interpretation  Date/Time:  Tuesday June 27 2019 14:02:26 EDT Ventricular Rate:  95 PR Interval:    QRS Duration: 109 QT Interval:  379 QTC Calculation: 477 R Axis:   62 Text Interpretation: Sinus rhythm Probable left atrial enlargement No acute changes No significant change since last tracing Confirmed by Varney Biles [phone]) on 06/27/2019 3:19:35 PM   Radiology US Aorta  Result Date: 06/27/2019 CLINICAL DATA:  Back pain, weakness, evaluate for abdominal aortic aneurysm EXAM: ULTRASOUND OF ABDOMINAL AORTA TECHNIQUE: Ultrasound examination of the abdominal aorta and proximal common iliac arteries was performed to evaluate for aneurysm. Additional color and Doppler images of the distal aorta were obtained to document patency. COMPARISON:  None. FINDINGS: Abdominal aortic measurements as follows: Proximal:  2.2 cm Mid:  1.8 cm Distal:  1.7 cm Patent: Yes, peak systolic velocity is 61 cm/s Right common iliac artery:  Not visualized cm Left common iliac artery: Not visualized cm IMPRESSION: 1.  No abdominal aortic aneurysm identified. 2. Absence of aortic aneurysm by ultrasound does not exclude aortic dissection, intramural hematoma, or other acute aortic pathology. Consider CT angiogram to further evaluate if there is high clinical concern for aortic pathology in the setting of acute pain. Electronically Signed   By: Eddie Candle M.D.   On: 06/27/2019 17:03   DG Chest Port 1 View  Result Date: 06/27/2019 CLINICAL DATA:  LEFT-sided back pain EXAM: PORTABLE CHEST 1 VIEW COMPARISON:  None. FINDINGS: Normal cardiac silhouette. Mild venous congestion. Prominent azygos fissure. No pneumothorax. No pulmonary edema. No infiltrate. No acute osseous abnormality. IMPRESSION: Central venous congestion. Electronically Signed   By: Suzy Bouchard M.D.   On: 06/27/2019 15:02   US Abdomen Limited RUQ  Result Date: 06/27/2019 CLINICAL DATA:  Right upper quadrant pain. EXAM: ULTRASOUND ABDOMEN LIMITED RIGHT UPPER QUADRANT COMPARISON:  None. FINDINGS: Gallbladder: Multiple gallstones evident, measuring up to 3.3 cm diameter. Gallbladder wall thickness upper normal at 3 mm. No pericholecystic fluid. Sonographer reports no sonographic Murphy sign. Common bile duct: Diameter: 8 mm common duct diameter in the hepatoduodenal ligament. Liver: No focal abnormality evident with subtle increase in parenchymal echotexture suggesting component of fatty deposition. Portal vein is patent on color Doppler imaging with normal direction of blood flow towards the liver. Other: None. IMPRESSION: Cholelithiasis with mild extrahepatic biliary duct distension. Correlation with liver function test recommended. Gallbladder wall thickness upper normal without pericholecystic fluid or sonographic Percell Miller  sign. Electronically Signed   By: Misty Stanley M.D.   On: 06/27/2019 17:07    Procedures .Critical Care Performed by: Varney Biles, MD Authorized by:  Varney Biles, MD   Critical care provider statement:    Critical care time (minutes):  100   Critical care was necessary to treat or prevent imminent or life-threatening deterioration of the following conditions:  Circulatory failure and shock   Critical care was time spent personally by me on the following activities:  Discussions with consultants, evaluation of patient's response to treatment, examination of patient, ordering and performing treatments and interventions, ordering and review of laboratory studies, ordering and review of radiographic studies, pulse oximetry, re-evaluation of patient's condition, obtaining history from patient or surrogate and review of old charts   (including critical care time)  Medications Ordered in ED Medications  cefTRIAXone (ROCEPHIN) 1 g in sodium chloride 0.9 % 100 mL IVPB (0 g Intravenous Stopped 06/27/19 1614)  norepinephrine (LEVOPHED) 97m in 2520mpremix infusion (10 mcg/min Intravenous Rate/Dose Change 06/27/19 1726)  0.9 %  sodium chloride infusion (has no administration in time range)  sodium chloride 0.9 % bolus 1,000 mL (0 mLs Intravenous Stopped 06/27/19 1614)  lactated ringers bolus 1,500 mL (0 mLs Intravenous Stopped 06/27/19 1614)  lactated ringers bolus 1,000 mL (0 mLs Intravenous Stopped 06/27/19 1726)    ED Course  I have reviewed the triage vital signs and the nursing notes.  Pertinent labs & imaging results that were available during my care of the patient were reviewed by me and considered in my medical decision making (see chart for details).  Clinical Course as of Jun 26 1749  Tue Jun 27, 2019  1500 Pt is covid positive - results discussed.  POC SARS Coronavirus 2 Ag-ED - Nasal Swab (BD Veritor Kit)(!) [AN]  157353atient has AKI and lactic acidosis -likely because of her low blood pressure. She has now received 2 L of IV fluid.  Blood pressure continues to be low.  Ultrasound results pending.  Peripheral vasopressors initiated.    Creatinine(!): 3.13 [AN]  1552 Dr. MuJohann Caperscritical care has requested that we put an art-line.  If the patient remains hypotensive then central line will be placed either by ED or by ICU team.  Dr. DyRoslynn Ambleas been made aware of patient, in case she deteriorates.   [AN]  1747 Results of the ED work-up discussed with the patient.  SARS Coronavirus 2 Ag(!): POSITIVE [AN]  1747 Lactic acid and elevated creatinine likely because of poor perfusion.  Patient has received 3.5 L IV fluid.  Lactic Acid, Venous(!!): 4.6 [AN]  1747 Ultrasound shows gallstones.  I have added lipase to her work-up.  Dr. ByBarry Dienesgeneral surgery has been consulted.  USKoreabdomen Limited RUQ [AN]  1750 Sepsis - Repeat Assessment  Sepsis hemodynamic reassessment completed at: 5:51 PM       [AN]    Clinical Course User Index [AN] NaVarney BilesMD   MDM Rules/Calculators/A&P                      [age]ear old female comes in a chief complaint of weakness. She is noted to be hypotensive, tachycardic.  Differential diagnosis initially includes hemorrhagic shock, septic shock, anaphylactic shock, hypovolemic shock.  Patient allegedly came to the ED yesterday.  I reviewed her labs and she is noted to have positive nitrites.  In the setting of her having flank pain, chills, nausea, vomiting -she could be having pyelonephritis and  resultant hypotension, septic shock.  Appropriate sepsis work-up has been initiated.  Oddly also noted that patient had lymphopenia.  COVID-19 is possible.  Point-of-care COVID-19 test was ordered and it is positive.   Additionally she is having right upper quadrant tenderness, epigastric tenderness.  Ultrasound right upper quadrant and ultrasound aorta ordered to rule out AAA -but there is clear evidence of gallstones.  I suspect that her primary issue is not cholelithiasis, but her right upper quadrant tenderness is likely because of gallstones.  We will consult surgery.  We have ordered 30  cc/kg IV fluid. Pressors have been initiated.   Amber Bowman was evaluated in Emergency Department on 06/27/2019 for the symptoms described in the history of present illness. She was evaluated in the context of the global COVID-19 pandemic, which necessitated consideration that the patient might be at risk for infection with the SARS-CoV-2 virus that causes COVID-19. Institutional protocols and algorithms that pertain to the evaluation of patients at risk for COVID-19 are in a state of rapid change based on information released by regulatory bodies including the CDC and federal and state organizations. These policies and algorithms were followed during the patient's care in the ED.  Sepsis - Repeat Assessment     Final Clinical Impression(s) / ED Diagnoses Final diagnoses:  RUQ abdominal pain  COVID-19  AKI (acute kidney injury) ([place])  Shock ([place])    Rx / DC Orders ED Discharge Orders    None        Varney Biles, MD 06/27/19 1752

## 2019-06-27 NOTE — ED Notes (Signed)
RT has been notified for A Line placement

## 2019-06-27 NOTE — ED Notes (Signed)
Pt left waiting room

## 2019-06-27 NOTE — Progress Notes (Signed)
Notified bedside nurse of need to draw repeat lactic acid due to the second one coming in higher than the first. Bedside RN Alroy Dust agreed.

## 2019-06-28 ENCOUNTER — Encounter (HOSPITAL_COMMUNITY): Payer: Medicare Other

## 2019-06-28 ENCOUNTER — Encounter (HOSPITAL_COMMUNITY): Payer: Self-pay | Admitting: Internal Medicine

## 2019-06-28 ENCOUNTER — Inpatient Hospital Stay (HOSPITAL_COMMUNITY): Payer: Medicare Other

## 2019-06-28 DIAGNOSIS — N179 Acute kidney failure, unspecified: Secondary | ICD-10-CM | POA: Diagnosis not present

## 2019-06-28 HISTORY — PX: IR NEPHROSTOMY PLACEMENT LEFT: IMG6063

## 2019-06-28 LAB — BLOOD GAS, ARTERIAL
Acid-base deficit: 10.7 mmol/L — ABNORMAL HIGH (ref 0.0–2.0)
Acid-base deficit: 12.1 mmol/L — ABNORMAL HIGH (ref 0.0–2.0)
Bicarbonate: 11.2 mmol/L — ABNORMAL LOW (ref 20.0–28.0)
Bicarbonate: 13.8 mmol/L — ABNORMAL LOW (ref 20.0–28.0)
Drawn by: 295031
FIO2: 32
O2 Saturation: 93.8 %
O2 Saturation: 98.2 %
Patient temperature: 96.7
Patient temperature: 98.6
pCO2 arterial: 18.5 mmHg — CL (ref 32.0–48.0)
pCO2 arterial: 27.7 mmHg — ABNORMAL LOW (ref 32.0–48.0)
pH, Arterial: 7.319 — ABNORMAL LOW (ref 7.350–7.450)
pH, Arterial: 7.393 (ref 7.350–7.450)
pO2, Arterial: 130 mmHg — ABNORMAL HIGH (ref 83.0–108.0)
pO2, Arterial: 72.3 mmHg — ABNORMAL LOW (ref 83.0–108.0)

## 2019-06-28 LAB — RENAL FUNCTION PANEL
Albumin: 2.3 g/dL — ABNORMAL LOW (ref 3.5–5.0)
Anion gap: 14 (ref 5–15)
BUN: 51 mg/dL — ABNORMAL HIGH (ref 8–23)
CO2: 14 mmol/L — ABNORMAL LOW (ref 22–32)
Calcium: 6.6 mg/dL — ABNORMAL LOW (ref 8.9–10.3)
Chloride: 111 mmol/L (ref 98–111)
Creatinine, Ser: 4.12 mg/dL — ABNORMAL HIGH (ref 0.44–1.00)
GFR calc Af Amer: 12 mL/min — ABNORMAL LOW (ref >60)
GFR calc non Af Amer: 11 mL/min — ABNORMAL LOW (ref >60)
Glucose, Bld: 95 mg/dL (ref 70–99)
Phosphorus: 4.6 mg/dL (ref 2.5–4.6)
Potassium: 3.7 mmol/L (ref 3.5–5.1)
Sodium: 139 mmol/L (ref 135–145)

## 2019-06-28 LAB — HEPATITIS B SURFACE ANTIGEN: Hepatitis B Surface Ag: NONREACTIVE

## 2019-06-28 LAB — CBC WITH DIFFERENTIAL/PLATELET
Abs Immature Granulocytes: 0.9 10*3/uL — ABNORMAL HIGH (ref 0.00–0.07)
Basophils Absolute: 0.1 10*3/uL (ref 0.0–0.1)
Basophils Relative: 0 %
Eosinophils Absolute: 0.1 10*3/uL (ref 0.0–0.5)
Eosinophils Relative: 0 %
HCT: 33.5 % — ABNORMAL LOW (ref 36.0–46.0)
Hemoglobin: 10.9 g/dL — ABNORMAL LOW (ref 12.0–15.0)
Immature Granulocytes: 5 %
Lymphocytes Relative: 8 %
Lymphs Abs: 1.7 10*3/uL (ref 0.7–4.0)
MCH: 31 pg (ref 26.0–34.0)
MCHC: 32.5 g/dL (ref 30.0–36.0)
MCV: 95.2 fL (ref 80.0–100.0)
Monocytes Absolute: 1.5 10*3/uL — ABNORMAL HIGH (ref 0.1–1.0)
Monocytes Relative: 7 %
Neutro Abs: 16 10*3/uL — ABNORMAL HIGH (ref 1.7–7.7)
Neutrophils Relative %: 80 %
Platelets: 67 10*3/uL — ABNORMAL LOW (ref 150–400)
RBC: 3.52 MIL/uL — ABNORMAL LOW (ref 3.87–5.11)
RDW: 14.3 % (ref 11.5–15.5)
WBC: 20.2 10*3/uL — ABNORMAL HIGH (ref 4.0–10.5)
nRBC: 0 % (ref 0.0–0.2)

## 2019-06-28 LAB — MRSA PCR SCREENING: MRSA by PCR: POSITIVE — AB

## 2019-06-28 LAB — BLOOD CULTURE ID PANEL (REFLEXED)

## 2019-06-28 LAB — COMPREHENSIVE METABOLIC PANEL
ALT: 26 U/L (ref 0–44)
AST: 33 U/L (ref 15–41)
Albumin: 2.6 g/dL — ABNORMAL LOW (ref 3.5–5.0)
Alkaline Phosphatase: 92 U/L (ref 38–126)
Anion gap: 17 — ABNORMAL HIGH (ref 5–15)
BUN: 49 mg/dL — ABNORMAL HIGH (ref 8–23)
CO2: 14 mmol/L — ABNORMAL LOW (ref 22–32)
Calcium: 7.4 mg/dL — ABNORMAL LOW (ref 8.9–10.3)
Chloride: 110 mmol/L (ref 98–111)
Creatinine, Ser: 3.8 mg/dL — ABNORMAL HIGH (ref 0.44–1.00)
GFR calc Af Amer: 14 mL/min — ABNORMAL LOW (ref >60)
GFR calc non Af Amer: 12 mL/min — ABNORMAL LOW (ref >60)
Glucose, Bld: 97 mg/dL (ref 70–99)
Potassium: 3.8 mmol/L (ref 3.5–5.1)
Sodium: 141 mmol/L (ref 135–145)
Total Bilirubin: 0.8 mg/dL (ref 0.3–1.2)
Total Protein: 5.5 g/dL — ABNORMAL LOW (ref 6.5–8.1)

## 2019-06-28 LAB — CORTISOL: Cortisol, Plasma: 38.5 ug/dL

## 2019-06-28 LAB — LACTIC ACID, PLASMA: Lactic Acid, Venous: 4.5 mmol/L (ref 0.5–1.9)

## 2019-06-28 LAB — CREATININE, URINE, RANDOM: Creatinine, Urine: 187.31 mg/dL

## 2019-06-28 LAB — GLUCOSE, CAPILLARY
Glucose-Capillary: 68 mg/dL — ABNORMAL LOW (ref 70–99)
Glucose-Capillary: 187 mg/dL — ABNORMAL HIGH (ref 70–99)
Glucose-Capillary: 77 mg/dL (ref 70–99)
Glucose-Capillary: 64 mg/dL — ABNORMAL LOW (ref 70–99)
Glucose-Capillary: 125 mg/dL — ABNORMAL HIGH (ref 70–99)
Glucose-Capillary: 64 mg/dL — ABNORMAL LOW (ref 70–99)
Glucose-Capillary: 62 mg/dL — ABNORMAL LOW (ref 70–99)
Glucose-Capillary: 80 mg/dL (ref 70–99)
Glucose-Capillary: 69 mg/dL — ABNORMAL LOW (ref 70–99)
Glucose-Capillary: 74 mg/dL (ref 70–99)
Glucose-Capillary: 63 mg/dL — ABNORMAL LOW (ref 70–99)

## 2019-06-28 LAB — SODIUM, URINE, RANDOM: Sodium, Ur: 85 mmol/L

## 2019-06-28 LAB — PROTIME-INR
INR: 1.6 — ABNORMAL HIGH (ref 0.8–1.2)
Prothrombin Time: 18.8 seconds — ABNORMAL HIGH (ref 11.4–15.2)

## 2019-06-28 LAB — URINALYSIS, ROUTINE W REFLEX MICROSCOPIC
Glucose, UA: NEGATIVE mg/dL
Ketones, ur: 5 mg/dL — AB
Nitrite: POSITIVE — AB
Protein, ur: 100 mg/dL — AB
RBC / HPF: >50 RBC/hpf — ABNORMAL HIGH (ref 0–5)
Specific Gravity, Urine: 1.025 (ref 1.005–1.030)
WBC, UA: >50 WBC/hpf — ABNORMAL HIGH (ref 0–5)
pH: 5 (ref 5.0–8.0)

## 2019-06-28 LAB — ABO/RH: ABO/RH(D): O POS

## 2019-06-28 LAB — C-REACTIVE PROTEIN: CRP: 22 mg/dL — ABNORMAL HIGH (ref <1.0)

## 2019-06-28 LAB — HIV ANTIBODY (ROUTINE TESTING W REFLEX): HIV Screen 4th Generation wRfx: NONREACTIVE

## 2019-06-28 LAB — PROCALCITONIN: Procalcitonin: 71.36 ng/mL

## 2019-06-28 LAB — D-DIMER, QUANTITATIVE: D-Dimer, Quant: >20 ug/mL-FEU — ABNORMAL HIGH (ref 0.00–0.50)

## 2019-06-28 LAB — PHOSPHORUS: Phosphorus: 4 mg/dL (ref 2.5–4.6)

## 2019-06-28 LAB — MAGNESIUM: Magnesium: 1.3 mg/dL — ABNORMAL LOW (ref 1.7–2.4)

## 2019-06-28 MED ORDER — MIDAZOLAM HCL 2 MG/2ML IJ SOLN
INTRAMUSCULAR | Status: AC | PRN
  Administered 2019-06-28: 1 mg via INTRAVENOUS

## 2019-06-28 MED ORDER — POTASSIUM CHLORIDE 10 MEQ/100ML IV SOLN
10.0000 meq | INTRAVENOUS | Status: AC
  Administered 2019-06-28 (×4): 10 meq via INTRAVENOUS
  Filled 2019-06-28 (×3): qty 100

## 2019-06-28 MED ORDER — ONDANSETRON HCL 4 MG/2ML IJ SOLN
4.0000 mg | Freq: Four times a day (QID) | INTRAMUSCULAR | Status: DC | PRN
  Administered 2019-06-28 – 2019-07-03 (×4): 4 mg via INTRAVENOUS
  Filled 2019-06-28 (×4): qty 2

## 2019-06-28 MED ORDER — DEXTROSE 50 % IV SOLN: 12.5000 g | INTRAVENOUS | Status: AC

## 2019-06-28 MED ORDER — STERILE WATER FOR INJECTION IV SOLN: INTRAVENOUS | Status: DC

## 2019-06-28 MED ORDER — SODIUM CHLORIDE 0.9% FLUSH: 10.0000 mL | INTRAVENOUS | Status: DC | PRN

## 2019-06-28 MED ORDER — DEXTROSE 50 % IV SOLN
12.5000 g | INTRAVENOUS | Status: AC
  Administered 2019-06-28: 12.5 g via INTRAVENOUS

## 2019-06-28 MED ORDER — ONDANSETRON HCL 4 MG/2ML IJ SOLN
4.0000 mg | Freq: Once | INTRAMUSCULAR | Status: AC
  Administered 2019-06-28: 4 mg via INTRAVENOUS
  Filled 2019-06-28: qty 2

## 2019-06-28 MED ORDER — FENTANYL CITRATE (PF) 100 MCG/2ML IJ SOLN
50.0000 ug | Freq: Once | INTRAMUSCULAR | Status: AC
  Administered 2019-06-28: 50 ug via INTRAVENOUS

## 2019-06-28 MED ORDER — LACTATED RINGERS IV BOLUS
1000.0000 mL | Freq: Once | INTRAVENOUS | Status: AC
  Administered 2019-06-28: 1000 mL via INTRAVENOUS

## 2019-06-28 MED ORDER — ONDANSETRON HCL 4 MG/2ML IJ SOLN
INTRAMUSCULAR | Status: AC
  Filled 2019-06-28: qty 2

## 2019-06-28 MED ORDER — MUPIROCIN 2 % EX OINT
1.0000 "application " | TOPICAL_OINTMENT | Freq: Two times a day (BID) | CUTANEOUS | Status: AC
  Administered 2019-06-28 – 2019-07-02 (×10): 1 via NASAL

## 2019-06-28 MED ORDER — FENTANYL CITRATE (PF) 100 MCG/2ML IJ SOLN
INTRAMUSCULAR | Status: AC
  Filled 2019-06-28: qty 2

## 2019-06-28 MED ORDER — INSULIN ASPART 100 UNIT/ML ~~LOC~~ SOLN
0.0000 [IU] | SUBCUTANEOUS | Status: DC
  Administered 2019-06-29 – 2019-07-09 (×19): 1 [IU] via SUBCUTANEOUS

## 2019-06-28 MED ORDER — VASOPRESSIN 20 UNIT/ML IV SOLN
0.0300 [IU]/min | INTRAVENOUS | Status: DC
  Administered 2019-06-28 – 2019-06-29 (×2): 0.03 [IU]/min via INTRAVENOUS
  Filled 2019-06-28 (×3): qty 2

## 2019-06-28 MED ORDER — DEXTROSE 50 % IV SOLN
INTRAVENOUS | Status: AC
  Administered 2019-06-28: 25 mL via INTRAVENOUS
  Filled 2019-06-28: qty 50

## 2019-06-28 MED ORDER — LIP MEDEX EX OINT
TOPICAL_OINTMENT | CUTANEOUS | Status: AC
  Filled 2019-06-28: qty 7

## 2019-06-28 MED ORDER — CEFAZOLIN SODIUM-DEXTROSE 2-4 GM/100ML-% IV SOLN
INTRAVENOUS | Status: AC
  Administered 2019-06-28: 2000 mg
  Filled 2019-06-28: qty 100

## 2019-06-28 MED ORDER — ONDANSETRON HCL 4 MG/2ML IJ SOLN
INTRAMUSCULAR | Status: AC | PRN
  Administered 2019-06-28: 4 mg via INTRAVENOUS

## 2019-06-28 MED ORDER — STERILE WATER FOR INJECTION IV SOLN
INTRAVENOUS | Status: DC
  Filled 2019-06-28 (×4): qty 850

## 2019-06-28 MED ORDER — ORAL CARE MOUTH RINSE
15.0000 mL | Freq: Two times a day (BID) | OROMUCOSAL | Status: DC
  Administered 2019-06-28 – 2019-07-11 (×20): 15 mL via OROMUCOSAL

## 2019-06-28 MED ORDER — IOHEXOL 300 MG/ML  SOLN
50.0000 mL | Freq: Once | INTRAMUSCULAR | Status: AC | PRN
  Administered 2019-06-28: 15 mL

## 2019-06-28 MED ORDER — MIDAZOLAM HCL 2 MG/2ML IJ SOLN
INTRAMUSCULAR | Status: AC
  Filled 2019-06-28: qty 2

## 2019-06-28 MED ORDER — NOREPINEPHRINE 4 MG/250ML-% IV SOLN
0.0000 ug/min | INTRAVENOUS | Status: DC
  Administered 2019-06-28: 22 ug/min via INTRAVENOUS
  Administered 2019-06-28 (×3): 28 ug/min via INTRAVENOUS
  Administered 2019-06-28: 26 ug/min via INTRAVENOUS
  Filled 2019-06-28 (×6): qty 250

## 2019-06-28 MED ORDER — MUPIROCIN 2 % EX OINT
TOPICAL_OINTMENT | CUTANEOUS | Status: AC
  Filled 2019-06-28: qty 22

## 2019-06-28 MED ORDER — DEXTROSE 50 % IV SOLN
INTRAVENOUS | Status: AC
  Filled 2019-06-28: qty 50

## 2019-06-28 MED ORDER — SODIUM CHLORIDE 0.9 % IV SOLN: INTRAVENOUS | Status: DC

## 2019-06-28 MED ORDER — LIDOCAINE HCL 1 % IJ SOLN
INTRAMUSCULAR | Status: AC
  Filled 2019-06-28: qty 20

## 2019-06-28 MED ORDER — FENTANYL CITRATE (PF) 100 MCG/2ML IJ SOLN
INTRAMUSCULAR | Status: AC | PRN
  Administered 2019-06-28: 50 ug via INTRAVENOUS

## 2019-06-28 MED ORDER — LIDOCAINE HCL (PF) 1 % IJ SOLN
INTRAMUSCULAR | Status: AC | PRN
  Administered 2019-06-28: 10 mL

## 2019-06-28 MED ORDER — DEXTROSE 50 % IV SOLN: 25.0000 mL | Freq: Once | INTRAVENOUS | Status: AC

## 2019-06-28 MED ORDER — HEPARIN (PORCINE) 25000 UT/250ML-% IV SOLN
1400.0000 [IU]/h | INTRAVENOUS | Status: DC
  Administered 2019-06-28: 1000 [IU]/h via INTRAVENOUS
  Administered 2019-06-29: 18:00:00 1150 [IU]/h via INTRAVENOUS
  Administered 2019-07-01 – 2019-07-02 (×2): 1400 [IU]/h via INTRAVENOUS
  Filled 2019-06-28 (×6): qty 250

## 2019-06-28 MED ORDER — PROMETHAZINE HCL 25 MG/ML IJ SOLN
12.5000 mg | Freq: Four times a day (QID) | INTRAMUSCULAR | Status: AC | PRN
  Administered 2019-06-29: 01:00:00 12.5 mg via INTRAVENOUS
  Filled 2019-06-28: qty 1

## 2019-06-28 MED ORDER — SODIUM CHLORIDE 0.9% FLUSH
10.0000 mL | Freq: Two times a day (BID) | INTRAVENOUS | Status: DC
  Administered 2019-06-28: 20 mL
  Administered 2019-06-28 – 2019-07-03 (×9): 10 mL
  Administered 2019-07-03: 40 mL
  Administered 2019-07-04 – 2019-07-11 (×14): 10 mL

## 2019-06-28 MED ORDER — DEXTROSE 50 % IV SOLN
INTRAVENOUS | Status: AC
  Administered 2019-06-28: 50 mL
  Filled 2019-06-28: qty 50

## 2019-06-28 MED ORDER — DEXTROSE 5 % IV SOLN: INTRAVENOUS | Status: DC

## 2019-06-28 MED ORDER — HEPARIN SODIUM (PORCINE) 1000 UNIT/ML DIALYSIS
1000.0000 [IU] | INTRAMUSCULAR | Status: DC | PRN
  Administered 2019-06-28: 12:00:00 2400 [IU] via INTRAVENOUS_CENTRAL
  Administered 2019-07-02: 1200 [IU] via INTRAVENOUS_CENTRAL
  Administered 2019-07-05: 15:00:00 2400 [IU] via INTRAVENOUS_CENTRAL
  Filled 2019-06-28: qty 6
  Filled 2019-06-28: qty 3
  Filled 2019-06-28 (×2): qty 6
  Filled 2019-06-28: qty 4
  Filled 2019-06-28: qty 6

## 2019-06-28 MED ORDER — DEXTROSE 50 % IV SOLN
INTRAVENOUS | Status: AC
  Administered 2019-06-28: 12.5 g via INTRAVENOUS
  Filled 2019-06-28: qty 50

## 2019-06-28 NOTE — Progress Notes (Signed)
NAME:  Amber Bowman, MRN:  [id number], DOB:  08-19-1955, LOS: 1 ADMISSION DATE:  06/27/2019, CONSULTATION DATE:  06/27/2019  REFERRING MD:  Dr Varney Baas ER, CHIEF COMPLAINT:  Acute renal failure, shoock, lactic acidosis, Covid-19   Brief History   See below  History of present illness   64 year old relatively healthy female who has not yet been vaccinated for COVID-19.  Presented on June 26, 2019 with SIRS physiology fever 101.3, heart rate 140 and left-sided flank pain and self-reported dyspnea due to anxiety.  Had a lactic acid of 2.0, mild acute kidney injury with creatinine 1.7 and a normal white count.  She waited in the ER for 4 hours apparently and then left.  However on June 27, 2018 when she returned with nonspecific symptoms found to be hypotensive.  Per ER physician patient has had significant amount of vomiting and nausea and poor p.o. intake.  No NSAID use.  She has required 3 L of fluid and low-dose Levophed through peripheral infusion to obtain a made arterial pressure of 65 and a systolic blood pressure greater than 75.  Pulse ox reported as normal throughout the course of the stay and no respiratory issues but at time of PCCM evaluation pulse ox 94% on 2 L nasal cannula.  Mentating quite well but feeling cold.  White count low-normal.  Chest x-ray fairly clear.  Creatinine 3.13 mg percent and a lactic acid of greater than 4 mg percent.  Critical care medicine asked admit the patient.  Bedside ultrasound showed evidence of gallstones and mild biliary duct dilatation.  CC is Dr. Barry Dienes has been called by ER.  Liver function test normal other than elevated alkaline phosphatase 200.  Urine analysis on June 26, 2019: Red cells greater than 50 and white cells greater than 11 with positive nitrates and leukocytes.  Suggestive of UTI  No significant Covid history other than age greater than 60.  She is Covid positive on testing in the ER  Past medical history positive for  epilepsy/seizures on Felbatol, topiramate and Keppra  Past Medical History    has a past medical history of Colon polyps (2014), Condyloma, Depression, Epilepsy ([place]), Osteopenia (04/2018), and Seizures ([place]).   reports that she has never smoked. She has never used smokeless tobacco.  Significant Hospital Events   June 27, 2019: Admit  Consults:  Nephrology 4/21 Urology 4/21  Procedures:  June 27, 2019: Arterial line planned  Significant Diagnostic Tests:  CT chest/abdomen/pelvis non contrast 4/20 > 1. Left-sided obstructive uropathy with 10 mm stone in the proximal left ureter causing mild hydroureteronephrosis and perinephric stranding. 2. Multiple bilateral nonobstructing renal calculi measuring up to 6 mm. 3. Small pleural effusions with basilar atelectasis. 4. Cholelithiasis with hydropic gallbladder. No evidence of acute inflammation. 5. Aortic Atherosclerosis (ICD10-I70.0).  Chest X-ray 4/20 > Central venous congestion.  US Aorta 4/20 >  1.  No abdominal aortic aneurysm identified. 2. Absence of aortic aneurysm by ultrasound does not exclude aortic dissection, intramural hematoma, or other acute aortic pathology. Consider CT angiogram to further evaluate if there is high clinical concern for aortic pathology in the setting of acute pain.  US Abdomen 4/20 >  Cholelithiasis with mild extrahepatic biliary duct distension. Correlation with liver function test recommended.  Gallbladder wall thickness upper normal without pericholecystic fluid or sonographic Murphy sign.   Micro Data:  June 27, 2019: COVID-19 > Positive June 27 2019: Urine culture > June 27, 2019: Blood culture > April 2021: Urine  streptococcal and Legionella >  Antimicrobials:  June 27, 2019 empiric ceftriaxone  COVID Rx Decadron 4/20 x 10 days (post cortisol) Remdesivir 4/20 x 5 days Toci (not giving pending sepsis evaluation)  Interim history/subjective:  Lying in bed with no  acute complaints, denies any pain. Able to answer all questions appropriately   Objective   Blood pressure (!) 84/61, pulse (!) 112, temperature (!) 97.5 F (36.4 C), temperature source Oral, resp. rate (!) 26, height 5\' 4"  (1.626 m), weight 75.3 kg, last menstrual period 02/11/2008, SpO2 93 %.        Intake/Output Summary (Last 24 hours) at 06/28/2019 0758 Last data filed at 06/28/2019 [id number] Gross per 24 hour  Intake 6580.43 ml  Output 150 ml  Net 6430.43 ml   Filed Weights   06/27/19 1352  Weight: 75.3 kg    Examination: General: Chronically ill appearing elderly female lying in bed in no acute distress, appears slightly lethargic  HEENT: [place]/AT MM pink/moist, PERRL, sclera non-icteric  Neuro: Alert and oriented x3 with some mild underlying confusion, able to follow all commands, non-focal  CV: s1s2 regular rate and rhythm, no murmur, rubs, or gallops,  PULM:  Clear to ascultation bilaterally, slightly diminished breath sounds, no added breat sounds, no increased work of breathing, oxygen saturations 90-96 on 3L [place] GI: soft, bowel sounds hypoactive in all 4 quadrants, non-tender, non-distended, mild left sided CVA tenderness on palpation  Extremities: warm/dry, no edema  Skin: no rashes or lesions  Resolved Hospital Problem list     Assessment & Plan:  Evolving Sepsis -Criteria: tachycardia, tachypnea, mild AMS, leukocytosis, and concern for acute infection   -Source is likely secondary to acute UTI  P: Continue supplemental oxygen Follow cultures  Continue IV antibiotics  Aggressive IV hydration provided overnight  Continue peripheral Levo, will need central access  Monitor urine output May use CVP to asses fluid status Procal 83.55 > 71.63 Lactic 2.4 > 5.0 > 4.5  Mild acute hypoxemic respiratory failure in the setting of sepsis and COVID-19  -COVID positive 4/20, no acute respiratory complaints currently  -CT chest with small pleural effusions with basilar  atelectasis. P:   Continue supplemental oxygen for sat goal >92 Head of bed elevated 30 degrees.   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 1-2 seizures at q2 months. Last seizure per BF - 3 weeks ago P: Continue keppra Continue topamaex Admitting provider had phone consult with Dr Rory Percy neuro with recommendations to continue Felbamate at half strength  Seizure precautions     Circulatory shock in the setting of sepsis  -S/P aggressive IV hydration with worsening renal function and oliguria  P:  Close monitoring of BP in ICU setting, A-lin in place  Continue pressors for MAP foal greater than 65  UTI -UA positive for nitrates and bacteria   Obstructing left kidney stone with hydroureteronephrosis  -Seen on CT scan 4/20 Acute kidney  -Baseline creatiine appears to be 0.7-0.8, creatinine currently 3.8 P:  Nephrology consulted, appreciate assistance  Urology consulted, appreciate assistance  Follow renal function / urine output Trend Bmet Avoid nephrotoxins, ensure adequate renal perfusion  IV hydration Obtain urine lytes  Consider renal ultrasound   Gallstones with mild biliary dilatation seen on admit ultrasound 06/27/2019 -patient denise any acute abdominal pain  -CT abdomen with Cholelithiasis with hydropic gallbladder  but no acute inflammation  P:   lipase and amylase WNL Surgery consulted on admission with no acute indications of surgical interventions Will need to follow up with surgery at discharge   At risk for DVT Thrombocytopenia new 06/27/2019 P: DVT prophylaxis with SCD Will begin pharmacologic prophylaxis once plt count stabilizes  Monitor for bleeding    Hypoglycemia  P: PRN hypoglycemia protocol  CBG q4  Best practice:  Diet: N.p.o. Pain/Anxiety/Delirium protocol (if  indicated): PRNs VAP protocol (if indicated):N/A DVT prophylaxis: scd but start lovenox if platelet improves GI prophylaxis: Protonix Glucose control: SSI Mobility: Bedrest Code Status: Full code Family Communication: Updated over the phone  Disposition: Admit to ICU from the [place] Performed by: Johnsie Cancel  Total critical care time: 55 minutes  Critical care time was exclusive of separately billable procedures and treating other patients.  Critical care was necessary to treat or prevent imminent or life-threatening deterioration.  Critical care was time spent personally by me on the following activities: development of treatment plan with patient and/or surrogate as well as nursing, discussions with consultants, evaluation of patient's response to treatment, examination of patient, obtaining history from patient or surrogate, ordering and performing treatments and interventions, ordering and review of laboratory studies, ordering and review of radiographic studies, pulse oximetry and re-evaluation of patient's condition.  Johnsie Cancel, NP-C [facility] Contact / Pager information can be found on Amion  06/28/2019, 8:53 AM  LABS    PULMONARY Recent Labs  Lab 06/27/19 1624 06/27/19 2338  PHART  --  7.393  PCO2ART  --  18.5*  PO2ART  --  130*  HCO3 13.7* 11.2*  O2SAT 52.3 98.2    CBC Recent Labs  Lab 06/26/19 2114 06/26/19 2114 06/27/19 1349 06/27/19 1904 06/28/19 0549  HGB 13.6  --  14.2  --  10.9*  HCT 42.3  --  44.0  --  33.5*  WBC 4.2  --  5.9  --  20.2*  PLT 280   < > 89* 80* 67*   < > = values in this interval not displayed.    COAGULATION Recent Labs  Lab 06/27/19 1349 06/27/19 1904  INR 1.7* 1.6*    CARDIAC  No results for input(s): TROPONINI in the last 168 hours. No results for input(s): PROBNP in the last 168 hours.   CHEMISTRY Recent Labs  Lab 06/26/19 2114  06/26/19 2114 06/27/19 1349 06/27/19 1349 06/27/19 1904 06/28/19 0549  NA 141  --  141  --  142 141  K 3.7   < > 3.1*   < > 3.0* 3.8  CL 112*  --  112*  --  113* 110  CO2 17*  --  14*  --  14* 14*  GLUCOSE 107*  --  115*  --  93 97  BUN 22  --  37*  --  38* 49*  CREATININE 1.44*  --  3.13*  --  3.10* 3.80*  CALCIUM 9.0  --  8.9  --  7.8* 7.4*  MG  --   --   --   --   --  1.3*  PHOS  --   --   --   --   --  4.0   < > = values in this interval not displayed.   Estimated Creatinine Clearance: 14.9 mL/min (A) (by C-G formula based on SCr of 3.8 mg/dL (H)).   LIVER Recent Labs  Lab 06/26/19 2114 06/27/19 1349 06/27/19 1904 06/28/19 0549  AST 29 43* 36 33  ALT 23 36 27 26  ALKPHOS 132* 200* 116 92  BILITOT 0.6 2.1* 1.2 0.8  PROT 7.1 7.1 5.4* 5.5*  ALBUMIN 3.8 3.5 2.6* 2.6*  INR  --  1.7* 1.6*  --      INFECTIOUS Recent Labs  Lab 06/27/19 1356 06/27/19 1620 06/27/19 1904 06/28/19 0549  LATICACIDVEN 4.6* 5.0*  --  4.5*  PROCALCITON  --   --  83.55 71.36     ENDOCRINE CBG (last 3)  Recent Labs    06/28/19 0406 06/28/19 0437 06/28/19 0737  GLUCAP 68* 187* 77         IMAGING x48h  - image(s) personally visualized  -   highlighted in bold CT ABDOMEN PELVIS WO CONTRAST  Result Date: 06/27/2019 CLINICAL DATA:  Sepsis EXAM: CT CHEST, ABDOMEN AND PELVIS WITHOUT CONTRAST TECHNIQUE: Multidetector CT imaging of the chest, abdomen and pelvis was performed following the standard protocol without IV contrast. COMPARISON:  None. FINDINGS: CT CHEST FINDINGS Cardiovascular: Heart size is normal. There are atherosclerotic calcifications of the thoracic aorta. Mediastinum/Nodes: Small hiatal hernia. No mediastinal or axillary lymphadenopathy. Lungs/Pleura: Small pleural effusions with basilar atelectasis. No other consolidation. There is an incidentally noted azygos fissure. Central airways are patent. Musculoskeletal: No chest wall mass or suspicious bone lesions identified.  CT ABDOMEN PELVIS FINDINGS HEPATOBILIARY: Normal hepatic contours. No intra- or extrahepatic biliary dilatation. There is cholelithiasis with a hydropic gallbladder. No evidence of acute inflammation. PANCREAS: Normal pancreas. No ductal dilatation or peripancreatic fluid collection. SPLEEN: Normal. ADRENALS/URINARY TRACT: The adrenal glands are normal. There is a stone within the proximal left ureter measuring 10 mm, causing mild hydroureteronephrosis and mild perinephric stranding. Additionally, there are multiple bilateral nonobstructing renal calculi that measure up to 6 mm. The urinary bladder is normal for degree of distention STOMACH/BOWEL: There is a small hiatal hernia. Normal duodenal course and caliber. No small bowel dilatation or inflammation. No focal colonic abnormality. Normal appendix. VASCULAR/LYMPHATIC: There is calcific atherosclerosis of the abdominal aorta. No abdominal or pelvic lymphadenopathy. REPRODUCTIVE: Normal uterus and ovaries. MUSCULOSKELETAL. No bony spinal canal stenosis or focal osseous abnormality. OTHER: None. IMPRESSION: 1. Left-sided obstructive uropathy with 10 mm stone in the proximal left ureter causing mild hydroureteronephrosis and perinephric stranding. 2. Multiple bilateral nonobstructing renal calculi measuring up to 6 mm. 3. Small pleural effusions with basilar atelectasis. 4. Cholelithiasis with hydropic gallbladder. No evidence of acute inflammation. 5. Aortic Atherosclerosis (ICD10-I70.0). Electronically Signed   By: Ulyses Jarred M.D.   On: 06/27/2019 22:09   CT CHEST WO CONTRAST  Result Date: 06/27/2019 CLINICAL DATA:  Sepsis EXAM: CT CHEST, ABDOMEN AND PELVIS WITHOUT CONTRAST TECHNIQUE: Multidetector CT imaging of the chest, abdomen and pelvis was performed following the standard protocol without IV contrast. COMPARISON:  None. FINDINGS: CT CHEST FINDINGS Cardiovascular: Heart size is normal. There are atherosclerotic calcifications of the thoracic aorta.  Mediastinum/Nodes: Small hiatal hernia. No mediastinal or axillary lymphadenopathy. Lungs/Pleura: Small pleural effusions with basilar atelectasis. No other consolidation. There is an incidentally noted azygos fissure. Central airways are patent. Musculoskeletal: No chest wall mass or suspicious bone lesions identified. CT ABDOMEN PELVIS FINDINGS HEPATOBILIARY: Normal hepatic contours. No intra- or extrahepatic biliary dilatation. There is cholelithiasis with a hydropic gallbladder. No evidence of acute inflammation. PANCREAS: Normal pancreas. No ductal dilatation or peripancreatic fluid collection. SPLEEN: Normal. ADRENALS/URINARY TRACT: The adrenal glands are normal. There is a stone within the proximal  left ureter measuring 10 mm, causing mild hydroureteronephrosis and mild perinephric stranding. Additionally, there are multiple bilateral nonobstructing renal calculi that measure up to 6 mm. The urinary bladder is normal for degree of distention STOMACH/BOWEL: There is a small hiatal hernia. Normal duodenal course and caliber. No small bowel dilatation or inflammation. No focal colonic abnormality. Normal appendix. VASCULAR/LYMPHATIC: There is calcific atherosclerosis of the abdominal aorta. No abdominal or pelvic lymphadenopathy. REPRODUCTIVE: Normal uterus and ovaries. MUSCULOSKELETAL. No bony spinal canal stenosis or focal osseous abnormality. OTHER: None. IMPRESSION: 1. Left-sided obstructive uropathy with 10 mm stone in the proximal left ureter causing mild hydroureteronephrosis and perinephric stranding. 2. Multiple bilateral nonobstructing renal calculi measuring up to 6 mm. 3. Small pleural effusions with basilar atelectasis. 4. Cholelithiasis with hydropic gallbladder. No evidence of acute inflammation. 5. Aortic Atherosclerosis (ICD10-I70.0). Electronically Signed   By: Ulyses Jarred M.D.   On: 06/27/2019 22:09   US Aorta  Result Date: 06/27/2019 CLINICAL DATA:  Back pain, weakness, evaluate for  abdominal aortic aneurysm EXAM: ULTRASOUND OF ABDOMINAL AORTA TECHNIQUE: Ultrasound examination of the abdominal aorta and proximal common iliac arteries was performed to evaluate for aneurysm. Additional color and Doppler images of the distal aorta were obtained to document patency. COMPARISON:  None. FINDINGS: Abdominal aortic measurements as follows: Proximal:  2.2 cm Mid:  1.8 cm Distal:  1.7 cm Patent: Yes, peak systolic velocity is 61 cm/s Right common iliac artery: Not visualized cm Left common iliac artery: Not visualized cm IMPRESSION: 1.  No abdominal aortic aneurysm identified. 2. Absence of aortic aneurysm by ultrasound does not exclude aortic dissection, intramural hematoma, or other acute aortic pathology. Consider CT angiogram to further evaluate if there is high clinical concern for aortic pathology in the setting of acute pain. Electronically Signed   By: Eddie Candle M.D.   On: 06/27/2019 17:03   DG Chest Port 1 View  Result Date: 06/27/2019 CLINICAL DATA:  LEFT-sided back pain EXAM: PORTABLE CHEST 1 VIEW COMPARISON:  None. FINDINGS: Normal cardiac silhouette. Mild venous congestion. Prominent azygos fissure. No pneumothorax. No pulmonary edema. No infiltrate. No acute osseous abnormality. IMPRESSION: Central venous congestion. Electronically Signed   By: Suzy Bouchard M.D.   On: 06/27/2019 15:02   US Abdomen Limited RUQ  Result Date: 06/27/2019 CLINICAL DATA:  Right upper quadrant pain. EXAM: ULTRASOUND ABDOMEN LIMITED RIGHT UPPER QUADRANT COMPARISON:  None. FINDINGS: Gallbladder: Multiple gallstones evident, measuring up to 3.3 cm diameter. Gallbladder wall thickness upper normal at 3 mm. No pericholecystic fluid. Sonographer reports no sonographic Murphy sign. Common bile duct: Diameter: 8 mm common duct diameter in the hepatoduodenal ligament. Liver: No focal abnormality evident with subtle increase in parenchymal echotexture suggesting component of fatty deposition. Portal vein is  patent on color Doppler imaging with normal direction of blood flow towards the liver. Other: None. IMPRESSION: Cholelithiasis with mild extrahepatic biliary duct distension. Correlation with liver function test recommended. Gallbladder wall thickness upper normal without pericholecystic fluid or sonographic Murphy sign. Electronically Signed   By: Misty Stanley M.D.   On: 06/27/2019 17:07

## 2019-06-28 NOTE — Progress Notes (Signed)
eLink Physician-Brief Progress Note Patient Name: Amber Bowman DOB: Aug 09, 1955 MRN: [id number]   Date of Service  06/28/2019  HPI/Events of Note  Urolithiasis with urinary tract obstruction and sepsis of urinary tract origin, Pt asking for something for nausea.  eICU Interventions  Zofran 4 mg iv Q 6 hours prn nausea ordered.        [name] Skylen Bowman 06/28/2019, 9:24 PM

## 2019-06-28 NOTE — Procedures (Signed)
Hemodialysis Catheter Insertion Procedure Note Amber Bowman [id number] 1955/07/10  Procedure: Insertion of Hemodialysis Catheter Indications: Dialysis Access   Procedure Details Consent: Risks of procedure as well as the alternatives and risks of each were explained to the (patient/caregiver).  Consent for procedure obtained. Time Out: Verified patient identification, verified procedure, site/side was marked, verified correct patient position, special equipment/implants available, medications/allergies/relevent history reviewed, required imaging and test results available.  Performed  Maximum sterile technique was used including antiseptics, cap, gloves, gown, hand hygiene, mask and sheet. Skin prep: Chlorhexidine x2; local anesthetic administered Triple lumen hemodialysis catheter was inserted into right internal jugular vein using the Seldinger technique.  Evaluation Blood flow good Complications: No apparent complications Patient did tolerate procedure well. Chest X-ray ordered to verify placement.  CXR: pending.  Johnsie Cancel, NP-C [facility] Contact / Pager information can be found on Amion  06/28/2019, 10:52 AM

## 2019-06-28 NOTE — Progress Notes (Signed)
Inpatient Diabetes Program Recommendations  AACE/ADA: New Consensus Statement on Inpatient Glycemic Control (2015)  Target Ranges:  Prepandial:   less than 140 mg/dL      Peak postprandial:   less than 180 mg/dL (1-2 hours)      Critically ill patients:  140 - 180 mg/dL   Lab Results  Component Value Date   GLUCAP 77 06/28/2019    Review of Glycemic Control Results for Amber Bowman, Amber Bowman (MRN [id number]) as of 06/28/2019 09:20  Ref. Range 06/27/2019 13:58 06/27/2019 23:30 06/28/2019 04:06 06/28/2019 04:37 06/28/2019 07:37  Glucose-Capillary Latest Ref Range: 70 - 99 mg/dL 94 82 68 (L) 187 (H) 77   AKI Diabetes history: none  Current orders for Inpatient glycemic control:  ICU order set Novolog 3-9 units Q4 hours  Inpatient Diabetes Program Recommendations:    Hypoglycemia without prior insulin being given.  Pt with AKI, reduce Novolog Correction to "sensitive" 1-3 units Q4 hours.  Thanks,  Tama Headings RN, MSN, BC-ADM Inpatient Diabetes Coordinator Team Pager [phone] (8a-5p)

## 2019-06-28 NOTE — Progress Notes (Signed)
Hypoglycemic Event  CBG: 64  Treatment: 1/2 amp D50 %  Symptoms: Asymptomatic   Follow-up CBG: Time: 1134 CBG 125   Possible Reasons for Event: Pt is NPO   Comments/MD notified: Yes    Emelio Schneller C Lithzy Bernard

## 2019-06-28 NOTE — Progress Notes (Signed)
Hypoglycemic Event  CBG: 64  Treatment: 1/2 amp of D50 %  Symptoms: cold and shaky  Follow-up CBG: Time: 1648   CBG Result: 80  Possible Reasons for Event: Pt is NPO  Comments/MD notified: Yes    Amber Bowman C Amber Bowman

## 2019-06-28 NOTE — Progress Notes (Signed)
[facility] Progress Note Patient Name: Amber Bowman DOB: 03/17/1955 MRN: [id number]   Date of Service  06/28/2019  HPI/Events of Note  ABG on room air = 7.39/18.5/130/11.2. Patient is ventilating her pCO2 down to 18.5 to make a normal pH. BP = 76/48 with MAP = 58. Last LA = 5.0 . Patient is on top dose of peripheral Norepinephrine IV infusion. K+ = 3.0 and Creatinine = 3.10.   eICU Interventions  Plan: 1. Follow respiratory status closely for need for intubation and ventilation. 2. Bolus with LR 1 liter IV over 1 hour now.  3. Replace K+.  4. Repeat Lactic Acid at 5 AM.      Intervention Category Major Interventions: Acid-Base disturbance - evaluation and management  Naly Schwanz [place] 06/28/2019, 1:42 AM

## 2019-06-28 NOTE — Consult Note (Signed)
Urology Consult   Physician requesting consult: Dr. Simonne Maffucci  Reason for consult: Left ureteral stone and sepsis  History of Present Illness: Amber Bowman is a 64 y.o. female who has a 3-day history of fever and malaise.  She developed 2 days of acute left-sided flank pain that became severe.  This was associate with nausea and vomiting caused her to present to the emergency department.  She was noted to be hypotensive and tachycardic consistent with sepsis.  She was admitted to the ICU.  She did undergo a CT scan of the abdomen and pelvis that revealed a 10 mm obstructing left proximal ureteral calculus.  She has no prior known history of urolithiasis.  She was incidentally noted to be Covid positive on admission.  She has not been vaccinated.  She denies a history of voiding or storage urinary symptoms, hematuria, UTIs, STDs, urolithiasis, GU malignancy/trauma/surgery.  Past Medical History:  Diagnosis Date  . Colon polyps 2014  . Condyloma   . Depression   . Epilepsy ([place])   . Osteopenia 04/2018   T score -1.8 FRAX 9.5% / 1.1% overall stable from prior DEXA  . Seizures ([place])     Past Surgical History:  Procedure Laterality Date  . CATARACT EXTRACTION  03/2015     Current Hospital Medications:  Home meds:  No current facility-administered medications on file prior to encounter.   Current Outpatient Medications on File Prior to Encounter  Medication Sig Dispense Refill  . aspirin 81 MG tablet Take 81 mg by mouth daily.      . betamethasone valerate ointment (VALISONE) 0.1 % Apply 1 application topically 2 (two) times daily. 30 g 1  . CRANBERRY PO Take 2 tablets by mouth daily.     . felbamate (FELBATOL) 600 MG tablet TAKE 2 TABLETS BY MOUTH 4 TIMES A DAY (Patient taking differently: Take 1,200 mg by mouth 4 (four) times daily. ) 240 tablet 6  . levETIRAcetam (KEPPRA) 500 MG tablet TAKE 1 TABLET BY MOUTH TWICE A DAY (Patient taking differently: Take 500 mg by mouth 2  (two) times daily. ) 180 tablet 2  . metroNIDAZOLE (METROGEL) 0.75 % vaginal gel Place 1 Applicatorful vaginally 2 (two) times daily. 70 g 0  . Multiple Vitamins-Minerals (ICAPS AREDS FORMULA PO) Take by mouth.    . topiramate (TOPAMAX) 200 MG tablet TAKE 1 TABLET BY MOUTH TWICE A DAY (Patient taking differently: Take 200 mg by mouth 2 (two) times daily. ) 180 tablet 3     Scheduled Meds: . aspirin EC  81 mg Oral Daily  . Chlorhexidine Gluconate Cloth  6 each Topical Daily  . dexamethasone (DECADRON) injection  6 mg Intravenous Q24H  . felbamate  300 mg Oral Q12H  . insulin aspart  3-9 Units Subcutaneous Q4H  . levETIRAcetam  500 mg Oral BID  . mouth rinse  15 mL Mouth Rinse BID  . multivitamin with minerals  1 tablet Oral Daily  . mupirocin ointment  1 application Nasal BID  . pantoprazole (PROTONIX) IV  40 mg Intravenous QHS  . topiramate  200 mg Oral BID   Continuous Infusions: . sodium chloride    . sodium chloride    . cefTRIAXone (ROCEPHIN)  IV Stopped (06/27/19 2357)  . norepinephrine (LEVOPHED) Adult infusion 10.0267 mcg/min (06/28/19 0733)  . remdesivir 100 mg in NS 100 mL    .  sodium bicarbonate (isotonic) infusion in sterile water     PRN Meds:.Place/Maintain arterial line **AND** sodium chloride  Allergies: No Known Allergies  Family History  Problem Relation Age of Onset  . Hypertension Mother   . Diabetes Mother   . Heart disease Mother   . Hypertension Father   . Heart disease Father   . Diabetes Sister   . Hypertension Sister   . Heart disease Sister   . Seizures Neg Hx     Social History:  reports that she has never smoked. She has never used smokeless tobacco. She reports that she does not drink alcohol or use drugs.  ROS: A complete review of systems was performed.  All systems are negative except for pertinent findings as noted.  Physical Exam:  Vital signs in last 24 hours: Temp:  [97.5 F (36.4 C)-98.3 F (36.8 C)] 97.5 F (36.4 C) (04/21  0738) Pulse Rate:  [98-124] 110 (04/21 0800) Resp:  [16-31] 27 (04/21 0800) BP: (64-97)/(38-70) 83/40 (04/21 0800) SpO2:  [90 %-99 %] 96 % (04/21 0800) Arterial Line BP: (73-138)/(46-69) 100/68 (04/21 0800) Weight:  [75.3 kg] 75.3 kg (04/20 1352) Constitutional:  Alert and oriented, No acute distress Cardiovascular: Tachycardic, No JVD Respiratory: Normal respiratory effort GI: Abdomen is soft, nondistended, no abdominal masses, diffuse abdominal tenderness GU: Severe left CVA tenderness Lymphatic: No lymphadenopathy Neurologic: Grossly intact, no focal deficits Psychiatric: Normal mood and affect  Laboratory Data:  Recent Labs    06/26/19 2114 06/27/19 1349 06/27/19 1904 06/28/19 0549  WBC 4.2 5.9  --  20.2*  HGB 13.6 14.2  --  10.9*  HCT 42.3 44.0  --  33.5*  PLT 280 89* 80* 67*    Recent Labs    06/26/19 2114 06/27/19 1349 06/27/19 1904 06/28/19 0549  NA 141 141 142 141  K 3.7 3.1* 3.0* 3.8  CL 112* 112* 113* 110  GLUCOSE 107* 115* 93 97  BUN 22 37* 38* 49*  CALCIUM 9.0 8.9 7.8* 7.4*  CREATININE 1.44* 3.13* 3.10* 3.80*     Results for orders placed or performed during the hospital encounter of 06/27/19 (from the past 24 hour(s))  Comprehensive metabolic panel     Status: Abnormal   Collection Time: 06/27/19  1:49 PM  Result Value Ref Range   Sodium 141 135 - 145 mmol/L   Potassium 3.1 (L) 3.5 - 5.1 mmol/L   Chloride 112 (H) 98 - 111 mmol/L   CO2 14 (L) 22 - 32 mmol/L   Glucose, Bld 115 (H) 70 - 99 mg/dL   BUN 37 (H) 8 - 23 mg/dL   Creatinine, Ser 3.13 (H) 0.44 - 1.00 mg/dL   Calcium 8.9 8.9 - 10.3 mg/dL   Total Protein 7.1 6.5 - 8.1 g/dL   Albumin 3.5 3.5 - 5.0 g/dL   AST 43 (H) 15 - 41 U/L   ALT 36 0 - 44 U/L   Alkaline Phosphatase 200 (H) 38 - 126 U/L   Total Bilirubin 2.1 (H) 0.3 - 1.2 mg/dL   GFR calc non Af Amer 15 (L) >60 mL/min   GFR calc Af Amer 17 (L) >60 mL/min   Anion gap 15 5 - 15  CBC WITH DIFFERENTIAL     Status: Abnormal    Collection Time: 06/27/19  1:49 PM  Result Value Ref Range   WBC 5.9 4.0 - 10.5 K/uL   RBC 4.54 3.87 - 5.11 MIL/uL   Hemoglobin 14.2 12.0 - 15.0 g/dL   HCT 44.0 36.0 - 46.0 %   MCV 96.9 80.0 - 100.0 fL   MCH 31.3 26.0 - 34.0  pg   MCHC 32.3 30.0 - 36.0 g/dL   RDW 13.7 11.5 - 15.5 %   Platelets 89 (L) 150 - 400 K/uL   nRBC 0.5 (H) 0.0 - 0.2 %   Neutrophils Relative % 86 %   Neutro Abs 5.0 1.7 - 7.7 K/uL   Lymphocytes Relative 8 %   Lymphs Abs 0.5 (L) 0.7 - 4.0 K/uL   Monocytes Relative 2 %   Monocytes Absolute 0.1 0.1 - 1.0 K/uL   Eosinophils Relative 1 %   Eosinophils Absolute 0.1 0.0 - 0.5 K/uL   Basophils Relative 0 %   Basophils Absolute 0.0 0.0 - 0.1 K/uL   WBC Morphology MILD LEFT SHIFT (1-5% METAS, OCC MYELO, OCC BANDS)    Immature Granulocytes 3 %   Abs Immature Granulocytes 0.19 (H) 0.00 - 0.07 K/uL  APTT     Status: Abnormal   Collection Time: 06/27/19  1:49 PM  Result Value Ref Range   aPTT 60 (H) 24 - 36 seconds  Protime-INR     Status: Abnormal   Collection Time: 06/27/19  1:49 PM  Result Value Ref Range   Prothrombin Time 19.6 (H) 11.4 - 15.2 seconds   INR 1.7 (H) 0.8 - 1.2  Blood Culture (routine x 2)     Status: None (Preliminary result)   Collection Time: 06/27/19  1:49 PM   Specimen: BLOOD  Result Value Ref Range   Specimen Description      BLOOD RIGHT WRIST Performed at [facility] [address] [place], [place] [address]    Special Requests      BOTTLES DRAWN AEROBIC AND ANAEROBIC Blood Culture adequate volume Performed at [facility] [address] [place], [place] [address]    Culture  Setup Time      GRAM NEGATIVE RODS AEROBIC BOTTLE ONLY Organism ID to follow    Culture      CULTURE REINCUBATED FOR BETTER GROWTH Performed at [facility], [place] [address], [place], [place] [address]    Report Status PENDING   Lactic acid, plasma     Status: Abnormal   Collection Time: 06/27/19  1:56 PM   Result Value Ref Range   Lactic Acid, Venous 4.6 (HH) 0.5 - 1.9 mmol/L  CBG monitoring, ED     Status: None   Collection Time: 06/27/19  1:58 PM  Result Value Ref Range   Glucose-Capillary 94 70 - 99 mg/dL  Respiratory Panel by RT PCR (Flu A&B, Covid) - Nasopharyngeal Swab     Status: None   Collection Time: 06/27/19  2:10 PM   Specimen: Nasopharyngeal Swab  Result Value Ref Range   SARS Coronavirus 2 by RT PCR NEGATIVE NEGATIVE   Influenza A by PCR NEGATIVE NEGATIVE   Influenza B by PCR NEGATIVE NEGATIVE  POC SARS Coronavirus 2 Ag-ED - Nasal Swab (BD Veritor Kit)     Status: Abnormal   Collection Time: 06/27/19  2:52 PM  Result Value Ref Range   SARS Coronavirus 2 Ag POSITIVE (A) NEGATIVE  Lactic acid, plasma     Status: Abnormal   Collection Time: 06/27/19  4:20 PM  Result Value Ref Range   Lactic Acid, Venous 5.0 (HH) 0.5 - 1.9 mmol/L  Type and screen [place]     Status: None   Collection Time: 06/27/19  4:20 PM  Result Value Ref Range   ABO/RH(D) O POS    Antibody Screen NEG    Sample  Expiration      06/30/2019,2359 Performed at [facility], [place] [address] [place], [place] [address]   ABO/Rh     Status: None   Collection Time: 06/27/19  4:20 PM  Result Value Ref Range   ABO/RH(D)      O POS Performed at [facility], [place] [address] [place], [place] [address]   Blood gas, venous ([facility], [facility], [facility])     Status: Abnormal   Collection Time: 06/27/19  4:24 PM  Result Value Ref Range   pH, Ven 7.263 7.250 - 7.430   pCO2, Ven 31.3 (L) 44.0 - 60.0 mmHg   pO2, Ven 32.3 32.0 - 45.0 mmHg   Bicarbonate 13.7 (L) 20.0 - 28.0 mmol/L   Acid-base deficit 11.9 (H) 0.0 - 2.0 mmol/L   O2 Saturation 52.3 %   Patient temperature 98.6   Lipase, blood     Status: None   Collection Time: 06/27/19  7:04 PM  Result Value Ref Range   Lipase 21 11 - 51 U/L  Procalcitonin - Baseline     Status: None   Collection Time: 06/27/19   7:04 PM  Result Value Ref Range   Procalcitonin 83.55 ng/mL  DIC (disseminated intravasc coag) panel     Status: Abnormal   Collection Time: 06/27/19  7:04 PM  Result Value Ref Range   Prothrombin Time 19.1 (H) 11.4 - 15.2 seconds   INR 1.6 (H) 0.8 - 1.2   aPTT 51 (H) 24 - 36 seconds   Fibrinogen 182 (L) 210 - 475 mg/dL   D-Dimer, Quant >20.00 (H) 0.00 - 0.50 ug/mL-FEU   Platelets 80 (L) 150 - 400 K/uL   Smear Review NO SCHISTOCYTES SEEN   Amylase     Status: None   Collection Time: 06/27/19  7:04 PM  Result Value Ref Range   Amylase 53 28 - 100 U/L  Lactate dehydrogenase     Status: Abnormal   Collection Time: 06/27/19  7:04 PM  Result Value Ref Range   LDH 398 (H) 98 - 192 U/L  Comprehensive metabolic panel     Status: Abnormal   Collection Time: 06/27/19  7:04 PM  Result Value Ref Range   Sodium 142 135 - 145 mmol/L   Potassium 3.0 (L) 3.5 - 5.1 mmol/L   Chloride 113 (H) 98 - 111 mmol/L   CO2 14 (L) 22 - 32 mmol/L   Glucose, Bld 93 70 - 99 mg/dL   BUN 38 (H) 8 - 23 mg/dL   Creatinine, Ser 3.10 (H) 0.44 - 1.00 mg/dL   Calcium 7.8 (L) 8.9 - 10.3 mg/dL   Total Protein 5.4 (L) 6.5 - 8.1 g/dL   Albumin 2.6 (L) 3.5 - 5.0 g/dL   AST 36 15 - 41 U/L   ALT 27 0 - 44 U/L   Alkaline Phosphatase 116 38 - 126 U/L   Total Bilirubin 1.2 0.3 - 1.2 mg/dL   GFR calc non Af Amer 15 (L) >60 mL/min   GFR calc Af Amer 18 (L) >60 mL/min   Anion gap 15 5 - 15  C-reactive protein     Status: Abnormal   Collection Time: 06/27/19  7:04 PM  Result Value Ref Range   CRP 22.0 (H) <1.0 mg/dL  Cortisol     Status: None   Collection Time: 06/27/19  7:04 PM  Result Value Ref Range   Cortisol, Plasma 38.5 ug/dL  MRSA PCR Screening     Status: Abnormal  Collection Time: 06/27/19 10:54 PM   Specimen: Nasal Mucosa; Nasopharyngeal  Result Value Ref Range   MRSA by PCR POSITIVE (A) NEGATIVE  Glucose, capillary     Status: None   Collection Time: 06/27/19 11:30 PM  Result Value Ref Range    Glucose-Capillary 82 70 - 99 mg/dL  Blood gas, arterial     Status: Abnormal   Collection Time: 06/27/19 11:38 PM  Result Value Ref Range   pH, Arterial 7.393 7.350 - 7.450   pCO2 arterial 18.5 (LL) 32.0 - 48.0 mmHg   pO2, Arterial 130 (H) 83.0 - 108.0 mmHg   Bicarbonate 11.2 (L) 20.0 - 28.0 mmol/L   Acid-base deficit 12.1 (H) 0.0 - 2.0 mmol/L   O2 Saturation 98.2 %   Patient temperature 96.7   Glucose, capillary     Status: Abnormal   Collection Time: 06/28/19  4:06 AM  Result Value Ref Range   Glucose-Capillary 68 (L) 70 - 99 mg/dL  Glucose, capillary     Status: Abnormal   Collection Time: 06/28/19  4:37 AM  Result Value Ref Range   Glucose-Capillary 187 (H) 70 - 99 mg/dL  Procalcitonin     Status: None   Collection Time: 06/28/19  5:49 AM  Result Value Ref Range   Procalcitonin 71.36 ng/mL  CBC with Differential/Platelet     Status: Abnormal   Collection Time: 06/28/19  5:49 AM  Result Value Ref Range   WBC 20.2 (H) 4.0 - 10.5 K/uL   RBC 3.52 (L) 3.87 - 5.11 MIL/uL   Hemoglobin 10.9 (L) 12.0 - 15.0 g/dL   HCT 33.5 (L) 36.0 - 46.0 %   MCV 95.2 80.0 - 100.0 fL   MCH 31.0 26.0 - 34.0 pg   MCHC 32.5 30.0 - 36.0 g/dL   RDW 14.3 11.5 - 15.5 %   Platelets 67 (L) 150 - 400 K/uL   nRBC 0.0 0.0 - 0.2 %   Neutrophils Relative % 80 %   Neutro Abs 16.0 (H) 1.7 - 7.7 K/uL   Lymphocytes Relative 8 %   Lymphs Abs 1.7 0.7 - 4.0 K/uL   Monocytes Relative 7 %   Monocytes Absolute 1.5 (H) 0.1 - 1.0 K/uL   Eosinophils Relative 0 %   Eosinophils Absolute 0.1 0.0 - 0.5 K/uL   Basophils Relative 0 %   Basophils Absolute 0.1 0.0 - 0.1 K/uL   WBC Morphology MILD LEFT SHIFT (1-5% METAS, OCC MYELO, OCC BANDS)    Immature Granulocytes 5 %   Abs Immature Granulocytes 0.90 (H) 0.00 - 0.07 K/uL  Comprehensive metabolic panel     Status: Abnormal   Collection Time: 06/28/19  5:49 AM  Result Value Ref Range   Sodium 141 135 - 145 mmol/L   Potassium 3.8 3.5 - 5.1 mmol/L   Chloride 110 98 - 111  mmol/L   CO2 14 (L) 22 - 32 mmol/L   Glucose, Bld 97 70 - 99 mg/dL   BUN 49 (H) 8 - 23 mg/dL   Creatinine, Ser 3.80 (H) 0.44 - 1.00 mg/dL   Calcium 7.4 (L) 8.9 - 10.3 mg/dL   Total Protein 5.5 (L) 6.5 - 8.1 g/dL   Albumin 2.6 (L) 3.5 - 5.0 g/dL   AST 33 15 - 41 U/L   ALT 26 0 - 44 U/L   Alkaline Phosphatase 92 38 - 126 U/L   Total Bilirubin 0.8 0.3 - 1.2 mg/dL   GFR calc non Af Amer 12 (L) >60 mL/min  GFR calc Af Amer 14 (L) >60 mL/min   Anion gap 17 (H) 5 - 15  Magnesium     Status: Abnormal   Collection Time: 06/28/19  5:49 AM  Result Value Ref Range   Magnesium 1.3 (L) 1.7 - 2.4 mg/dL  D-dimer, quantitative (not at [facility])     Status: Abnormal   Collection Time: 06/28/19  5:49 AM  Result Value Ref Range   D-Dimer, Quant >20.00 (H) 0.00 - 0.50 ug/mL-FEU  Phosphorus     Status: None   Collection Time: 06/28/19  5:49 AM  Result Value Ref Range   Phosphorus 4.0 2.5 - 4.6 mg/dL  Lactic acid, plasma     Status: Abnormal   Collection Time: 06/28/19  5:49 AM  Result Value Ref Range   Lactic Acid, Venous 4.5 (HH) 0.5 - 1.9 mmol/L  Glucose, capillary     Status: None   Collection Time: 06/28/19  7:37 AM  Result Value Ref Range   Glucose-Capillary 77 70 - 99 mg/dL  Blood gas, arterial     Status: Abnormal   Collection Time: 06/28/19  8:10 AM  Result Value Ref Range   FIO2 32.00    Delivery systems NASAL CANNULA    pH, Arterial 7.319 (L) 7.350 - 7.450   pCO2 arterial 27.7 (L) 32.0 - 48.0 mmHg   pO2, Arterial 72.3 (L) 83.0 - 108.0 mmHg   Bicarbonate 13.8 (L) 20.0 - 28.0 mmol/L   Acid-base deficit 10.7 (H) 0.0 - 2.0 mmol/L   O2 Saturation 93.8 %   Patient temperature 98.6    Collection site A-LINE    Drawn by [phone]    Sample type ARTERIAL    Recent Results (from the past 240 hour(s))  Blood Culture (routine x 2)     Status: None (Preliminary result)   Collection Time: 06/27/19  1:49 PM   Specimen: BLOOD  Result Value Ref Range Status   Specimen Description   Final     BLOOD RIGHT WRIST Performed at [facility], [place] [address] [place], [place] [address]    Special Requests   Final    BOTTLES DRAWN AEROBIC AND ANAEROBIC Blood Culture adequate volume Performed at [facility] [address], [place], [place] [address]    Culture  Setup Time   Final    GRAM NEGATIVE RODS AEROBIC BOTTLE ONLY Organism ID to follow    Culture   Final    CULTURE REINCUBATED FOR BETTER GROWTH Performed at [facility], [place] [address] [place], [place] [address]    Report Status PENDING  Incomplete  Respiratory Panel by RT PCR (Flu A&B, Covid) - Nasopharyngeal Swab     Status: None   Collection Time: 06/27/19  2:10 PM   Specimen: Nasopharyngeal Swab  Result Value Ref Range Status   SARS Coronavirus 2 by RT PCR NEGATIVE NEGATIVE Final    Comment: (NOTE) SARS-CoV-2 target nucleic acids are NOT DETECTED. The SARS-CoV-2 RNA is generally detectable in upper respiratoy specimens during the acute phase of infection. The lowest concentration of SARS-CoV-2 viral copies this assay can detect is 131 copies/mL. A negative result does not preclude SARS-Cov-2 infection and should not be used as the sole basis for treatment or other patient management decisions. A negative result may occur with  improper specimen collection/handling, submission of specimen other than nasopharyngeal swab, presence of viral mutation(s) within the areas targeted by this assay, and inadequate number of viral copies (<131 copies/mL). A negative  result must be combined with clinical observations, patient history, and epidemiological information. The expected result is Negative. Fact Sheet for Patients:  PinkCheek.be Fact Sheet for Healthcare Providers:  GravelBags.it This test is not yet ap proved or cleared by the [place] FDA and  has been authorized for detection and/or diagnosis of SARS-CoV-2  by FDA under an Emergency Use Authorization (EUA). This EUA will remain  in effect (meaning this test can be used) for the duration of the COVID-19 declaration under Section 564(b)(1) of the Act, 21 U.S.C. section 360bbb-3(b)(1), unless the authorization is terminated or revoked sooner.    Influenza A by PCR NEGATIVE NEGATIVE Final   Influenza B by PCR NEGATIVE NEGATIVE Final    Comment: (NOTE) The Xpert Xpress SARS-CoV-2/FLU/RSV assay is intended as an aid in  the diagnosis of influenza from Nasopharyngeal swab specimens and  should not be used as a sole basis for treatment. Nasal washings and  aspirates are unacceptable for Xpert Xpress SARS-CoV-2/FLU/RSV  testing. Fact Sheet for Patients: PinkCheek.be Fact Sheet for Healthcare Providers: GravelBags.it This test is not yet approved or cleared by the [place] FDA and  has been authorized for detection and/or diagnosis of SARS-CoV-2 by  FDA under an Emergency Use Authorization (EUA). This EUA will remain  in effect (meaning this test can be used) for the duration of the  Covid-19 declaration under Section 564(b)(1) of the Act, 21  U.S.C. section 360bbb-3(b)(1), unless the authorization is  terminated or revoked. Performed at [facility], [place] [address] [place], [place] [address]   MRSA PCR Screening     Status: Abnormal   Collection Time: 06/27/19 10:54 PM   Specimen: Nasal Mucosa; Nasopharyngeal  Result Value Ref Range Status   MRSA by PCR POSITIVE (A) NEGATIVE Final    Comment:        The GeneXpert MRSA Assay (FDA approved for NASAL specimens only), is one component of a comprehensive MRSA colonization surveillance program. It is not intended to diagnose MRSA infection nor to guide or monitor treatment for MRSA infections. RESULT CALLED TO, READ BACK BY AND VERIFIED WITH: JOHNSON,H @ 0050 ON [id number] BY POTEAT,S Performed at [facility], [place] [address] [place],  [address]     Renal Function: Recent Labs    06/26/19 2114 06/27/19 1349 06/27/19 1904 06/28/19 0549  CREATININE 1.44* 3.13* 3.10* 3.80*   Estimated Creatinine Clearance: 14.9 mL/min (A) (by C-G formula based on SCr of 3.8 mg/dL (H)).  Radiologic Imaging: CT ABDOMEN PELVIS WO CONTRAST  Result Date: 06/27/2019 CLINICAL DATA:  Sepsis EXAM: CT CHEST, ABDOMEN AND PELVIS WITHOUT CONTRAST TECHNIQUE: Multidetector CT imaging of the chest, abdomen and pelvis was performed following the standard protocol without IV contrast. COMPARISON:  None. FINDINGS: CT CHEST FINDINGS Cardiovascular: Heart size is normal. There are atherosclerotic calcifications of the thoracic aorta. Mediastinum/Nodes: Small hiatal hernia. No mediastinal or axillary lymphadenopathy. Lungs/Pleura: Small pleural effusions with basilar atelectasis. No other consolidation. There is an incidentally noted azygos fissure. Central airways are patent. Musculoskeletal: No chest wall mass or suspicious bone lesions identified. CT ABDOMEN PELVIS FINDINGS HEPATOBILIARY: Normal hepatic contours. No intra- or extrahepatic biliary dilatation. There is cholelithiasis with a hydropic gallbladder. No evidence of acute inflammation. PANCREAS: Normal pancreas. No ductal dilatation or peripancreatic fluid collection. SPLEEN: Normal. ADRENALS/URINARY TRACT: The adrenal glands are normal. There is a stone within the proximal left ureter measuring 10 mm, causing mild hydroureteronephrosis and mild perinephric stranding. Additionally, there are multiple bilateral  nonobstructing renal calculi that measure up to 6 mm. The urinary bladder is normal for degree of distention STOMACH/BOWEL: There is a small hiatal hernia. Normal duodenal course and caliber. No small bowel dilatation or inflammation. No focal colonic abnormality. Normal appendix. VASCULAR/LYMPHATIC: There is calcific atherosclerosis of the abdominal  aorta. No abdominal or pelvic lymphadenopathy. REPRODUCTIVE: Normal uterus and ovaries. MUSCULOSKELETAL. No bony spinal canal stenosis or focal osseous abnormality. OTHER: None. IMPRESSION: 1. Left-sided obstructive uropathy with 10 mm stone in the proximal left ureter causing mild hydroureteronephrosis and perinephric stranding. 2. Multiple bilateral nonobstructing renal calculi measuring up to 6 mm. 3. Small pleural effusions with basilar atelectasis. 4. Cholelithiasis with hydropic gallbladder. No evidence of acute inflammation. 5. Aortic Atherosclerosis (ICD10-I70.0). Electronically Signed   By: Ulyses Jarred M.D.   On: 06/27/2019 22:09   CT CHEST WO CONTRAST  Result Date: 06/27/2019 CLINICAL DATA:  Sepsis EXAM: CT CHEST, ABDOMEN AND PELVIS WITHOUT CONTRAST TECHNIQUE: Multidetector CT imaging of the chest, abdomen and pelvis was performed following the standard protocol without IV contrast. COMPARISON:  None. FINDINGS: CT CHEST FINDINGS Cardiovascular: Heart size is normal. There are atherosclerotic calcifications of the thoracic aorta. Mediastinum/Nodes: Small hiatal hernia. No mediastinal or axillary lymphadenopathy. Lungs/Pleura: Small pleural effusions with basilar atelectasis. No other consolidation. There is an incidentally noted azygos fissure. Central airways are patent. Musculoskeletal: No chest wall mass or suspicious bone lesions identified. CT ABDOMEN PELVIS FINDINGS HEPATOBILIARY: Normal hepatic contours. No intra- or extrahepatic biliary dilatation. There is cholelithiasis with a hydropic gallbladder. No evidence of acute inflammation. PANCREAS: Normal pancreas. No ductal dilatation or peripancreatic fluid collection. SPLEEN: Normal. ADRENALS/URINARY TRACT: The adrenal glands are normal. There is a stone within the proximal left ureter measuring 10 mm, causing mild hydroureteronephrosis and mild perinephric stranding. Additionally, there are multiple bilateral nonobstructing renal calculi that  measure up to 6 mm. The urinary bladder is normal for degree of distention STOMACH/BOWEL: There is a small hiatal hernia. Normal duodenal course and caliber. No small bowel dilatation or inflammation. No focal colonic abnormality. Normal appendix. VASCULAR/LYMPHATIC: There is calcific atherosclerosis of the abdominal aorta. No abdominal or pelvic lymphadenopathy. REPRODUCTIVE: Normal uterus and ovaries. MUSCULOSKELETAL. No bony spinal canal stenosis or focal osseous abnormality. OTHER: None. IMPRESSION: 1. Left-sided obstructive uropathy with 10 mm stone in the proximal left ureter causing mild hydroureteronephrosis and perinephric stranding. 2. Multiple bilateral nonobstructing renal calculi measuring up to 6 mm. 3. Small pleural effusions with basilar atelectasis. 4. Cholelithiasis with hydropic gallbladder. No evidence of acute inflammation. 5. Aortic Atherosclerosis (ICD10-I70.0). Electronically Signed   By: Ulyses Jarred M.D.   On: 06/27/2019 22:09   US Aorta  Result Date: 06/27/2019 CLINICAL DATA:  Back pain, weakness, evaluate for abdominal aortic aneurysm EXAM: ULTRASOUND OF ABDOMINAL AORTA TECHNIQUE: Ultrasound examination of the abdominal aorta and proximal common iliac arteries was performed to evaluate for aneurysm. Additional color and Doppler images of the distal aorta were obtained to document patency. COMPARISON:  None. FINDINGS: Abdominal aortic measurements as follows: Proximal:  2.2 cm Mid:  1.8 cm Distal:  1.7 cm Patent: Yes, peak systolic velocity is 61 cm/s Right common iliac artery: Not visualized cm Left common iliac artery: Not visualized cm IMPRESSION: 1.  No abdominal aortic aneurysm identified. 2. Absence of aortic aneurysm by ultrasound does not exclude aortic dissection, intramural hematoma, or other acute aortic pathology. Consider CT angiogram to further evaluate if there is high clinical concern for aortic pathology in the setting of acute pain. Electronically Signed  By: Eddie Candle M.D.   On: 06/27/2019 17:03   DG Chest Port 1 View  Result Date: 06/27/2019 CLINICAL DATA:  LEFT-sided back pain EXAM: PORTABLE CHEST 1 VIEW COMPARISON:  None. FINDINGS: Normal cardiac silhouette. Mild venous congestion. Prominent azygos fissure. No pneumothorax. No pulmonary edema. No infiltrate. No acute osseous abnormality. IMPRESSION: Central venous congestion. Electronically Signed   By: Suzy Bouchard M.D.   On: 06/27/2019 15:02   US Abdomen Limited RUQ  Result Date: 06/27/2019 CLINICAL DATA:  Right upper quadrant pain. EXAM: ULTRASOUND ABDOMEN LIMITED RIGHT UPPER QUADRANT COMPARISON:  None. FINDINGS: Gallbladder: Multiple gallstones evident, measuring up to 3.3 cm diameter. Gallbladder wall thickness upper normal at 3 mm. No pericholecystic fluid. Sonographer reports no sonographic Murphy sign. Common bile duct: Diameter: 8 mm common duct diameter in the hepatoduodenal ligament. Liver: No focal abnormality evident with subtle increase in parenchymal echotexture suggesting component of fatty deposition. Portal vein is patent on color Doppler imaging with normal direction of blood flow towards the liver. Other: None. IMPRESSION: Cholelithiasis with mild extrahepatic biliary duct distension. Correlation with liver function test recommended. Gallbladder wall thickness upper normal without pericholecystic fluid or sonographic Murphy sign. Electronically Signed   By: Misty Stanley M.D.   On: 06/27/2019 17:07    I independently reviewed the above imaging studies.  Impression/Recommendation: Left ureteral stone with sepsis: Continue broad-spectrum IV antibiotics.  Send urine for culture.  Considering the fact that the patient is hemodynamically unstable on IV pressor agents, I recommended proceeding with left nephrostomy placement rather than proceeding with ureteral stent placement under general anesthesia.  I have spoken with Dr. Laurence Ferrari in interventional radiology and they will proceed  with left nephrostomy tube placement today.  Hopefully, cultures from nephrostomy drainage will be useful for directing further antibiotic therapy.  Definitive stone management will be deferred until her infection has cleared.  She also will need time to appropriately quarantine due to her Covid positive status.  Dutch Gray 06/28/2019, 9:21 AM  Pryor Curia. MD   CC: Dr. Simonne Maffucci

## 2019-06-28 NOTE — Progress Notes (Signed)
PCCM progress note  Patient is now s/p percutaneous placement of nephrostomy tube for obstructing renal calculi without any acute complications. Therefore will proceed with initiation of heparin drip for DVT prophylaxis in the setting of COVID with elevated D-dimer on admission. Have asked pharmacy to assist in dosing.   Johnsie Cancel, NP-C [facility] Contact / Pager information can be found on Amion  06/28/2019, 5:13 PM

## 2019-06-28 NOTE — Progress Notes (Signed)
PHARMACY - PHYSICIAN COMMUNICATION CRITICAL VALUE ALERT - BLOOD CULTURE IDENTIFICATION (BCID)  Amber Bowman is an 64 y.o. female who presented to [facility] on 06/27/2019 with a chief complaint of weakness, back pain, found to be COVID positive.  Assessment:  Sepsis, UTI, possible pyelo with left ureteral stone.  Urology is planning for left nephrostomy placement in IR today. Blood cultures with GNR and BCID result is Ecoli.  Name of physician (or Provider) Contacted: Dr. Lake Bells  Current antibiotics: Ceftriaxone 2g IV q24h  Changes to prescribed antibiotics recommended:  Patient is on recommended antibiotics - No changes needed  Results for orders placed or performed during the hospital encounter of 06/27/19  Blood Culture ID Panel (Reflexed) (Collected: 06/27/2019  1:49 PM)  Result Value Ref Range   Enterococcus species NOT DETECTED NOT DETECTED   Listeria monocytogenes NOT DETECTED NOT DETECTED   Staphylococcus species NOT DETECTED NOT DETECTED   Staphylococcus aureus (BCID) NOT DETECTED NOT DETECTED   Streptococcus species NOT DETECTED NOT DETECTED   Streptococcus agalactiae NOT DETECTED NOT DETECTED   Streptococcus pneumoniae NOT DETECTED NOT DETECTED   Streptococcus pyogenes NOT DETECTED NOT DETECTED   Acinetobacter baumannii NOT DETECTED NOT DETECTED   Enterobacteriaceae species DETECTED (A) NOT DETECTED   Enterobacter cloacae complex NOT DETECTED NOT DETECTED   Escherichia coli DETECTED (A) NOT DETECTED   Klebsiella oxytoca NOT DETECTED NOT DETECTED   Klebsiella pneumoniae NOT DETECTED NOT DETECTED   Proteus species NOT DETECTED NOT DETECTED   Serratia marcescens NOT DETECTED NOT DETECTED   Carbapenem resistance NOT DETECTED NOT DETECTED   Haemophilus influenzae NOT DETECTED NOT DETECTED   Neisseria meningitidis NOT DETECTED NOT DETECTED   Pseudomonas aeruginosa NOT DETECTED NOT DETECTED   Candida albicans NOT DETECTED NOT DETECTED   Candida glabrata NOT DETECTED NOT  DETECTED   Candida krusei NOT DETECTED NOT DETECTED   Candida parapsilosis NOT DETECTED NOT DETECTED   Candida tropicalis NOT DETECTED NOT [place] PharmD, BCPS Pager: [phone] 06/28/2019 11:21 AM

## 2019-06-28 NOTE — Progress Notes (Addendum)
[facility] Progress Note Patient Name: Amber Bowman DOB: 02-09-1956 MRN: [id number]   Date of Service  06/28/2019  HPI/Events of Note  Will order: 1. Nausea - QTc interval = 0.45 seconds and 2. Oliguria - Bladder scan with > 400 mL residual.   eICU Interventions  Plan: 1. I/O Cath PRN. 2. Nursing to call back with QTc interval.      Intervention Category Major Interventions: Other:  Lysle Dingwall 06/28/2019, 3:55 AM

## 2019-06-28 NOTE — Progress Notes (Signed)
[facility] Progress Note Patient Name: Amber Bowman DOB: 12/26/1955 MRN: [id number]   Date of Service  06/28/2019  HPI/Events of Note  Low normal blood sugar, Pt with minimal po intake due to nausea and vomiting. Creatinine 4.  eICU Interventions  D5 % water infusion ordered at 50 ml / hour.        [name] Yochanan Eddleman 06/28/2019, 11:48 PM

## 2019-06-28 NOTE — H&P (Signed)
Referring Physician(s): Raynelle Bring, MD  Supervising Physician: Dr. Jacqulynn Cadet  Patient Status: [facility] Inpatient  Chief Complaint:  Left ureteral stone with sepsis   Subjective:  64 y.o. female who has a 3-day history of fever and malaise. She developed 2 days of acute left-sided flank pain that became severe. This was associated with nausea and vomiting causing her to present to the emergency department. She was noted to be hypotensive and tachycardic consistent with sepsis and she was admitted to the ICU. CT scan of the abdomen and pelvis revealed a 10 mm obstructing left proximal ureteral calculus. She has no prior known history of urolithiasis. She was incidentally noted to be Covid positive on admission.  She has not been vaccinated.  Patient endorses left flank pain. She denies nausea, vomiting, headache or dizziness.   Past Medical History:  Diagnosis Date  . Colon polyps 2014  . Condyloma   . Depression   . Epilepsy ([place])   . Osteopenia 04/2018   T score -1.8 FRAX 9.5% / 1.1% overall stable from prior DEXA  . Seizures ([place])    Past Surgical History:  Procedure Laterality Date  . CATARACT EXTRACTION  03/2015     Allergies: Patient has no known allergies.  Medications: Prior to Admission medications   Medication Sig Start Date End Date Taking? Authorizing Provider  aspirin 81 MG tablet Take 81 mg by mouth daily.      [provider]  betamethasone valerate ointment (VALISONE) 0.1 % Apply 1 application topically 2 (two) times daily. 05/26/19   Princess Bruins, MD  CRANBERRY PO Take 2 tablets by mouth daily.     [provider]  felbamate (FELBATOL) 600 MG tablet TAKE 2 TABLETS BY MOUTH 4 TIMES A DAY Patient taking differently: Take 1,200 mg by mouth 4 (four) times daily.  06/07/19   Ward Givens, NP  levETIRAcetam (KEPPRA) 500 MG tablet TAKE 1 TABLET BY MOUTH TWICE A DAY Patient taking differently: Take 500 mg by mouth 2 (two) times  daily.  03/27/19   Ward Givens, NP  metroNIDAZOLE (METROGEL) 0.75 % vaginal gel Place 1 Applicatorful vaginally 2 (two) times daily. 05/26/19   Princess Bruins, MD  Multiple Vitamins-Minerals (ICAPS AREDS FORMULA PO) Take by mouth.    [provider]  topiramate (TOPAMAX) 200 MG tablet TAKE 1 TABLET BY MOUTH TWICE A DAY Patient taking differently: Take 200 mg by mouth 2 (two) times daily.  04/14/19   Ward Givens, NP     Vital Signs: BP (!) 83/40   Pulse (!) 110   Temp (!) 97.5 F (36.4 C) (Oral)   Resp (!) 27   Ht 5\' 4"  (1.626 m)   Wt 166 lb 0.1 oz (75.3 kg)   LMP 02/11/2008   SpO2 96%   BMI 28.49 kg/m   Physical Exam   HENT: Mucous membranes moist, voice clear, non-icteric Cardiac: Tachycardic, regular rhythm, S1, S2. Extremities cool, pulses weak. Respiratory: Mild expiratory wheeze, relaxed work of breathing GI: Soft, non-distended, hypoactive. Diffuse tenderness GU: Severe left CVA tenderness Neuro: mild lethargy, alert and oriented x4, mood and affect appropriate for situation.   Imaging: CT ABDOMEN PELVIS WO CONTRAST  Result Date: 06/27/2019 CLINICAL DATA:  Sepsis EXAM: CT CHEST, ABDOMEN AND PELVIS WITHOUT CONTRAST TECHNIQUE: Multidetector CT imaging of the chest, abdomen and pelvis was performed following the standard protocol without IV contrast. COMPARISON:  None. FINDINGS: CT CHEST FINDINGS Cardiovascular: Heart size is normal. There are atherosclerotic calcifications of the thoracic  aorta. Mediastinum/Nodes: Small hiatal hernia. No mediastinal or axillary lymphadenopathy. Lungs/Pleura: Small pleural effusions with basilar atelectasis. No other consolidation. There is an incidentally noted azygos fissure. Central airways are patent. Musculoskeletal: No chest wall mass or suspicious bone lesions identified. CT ABDOMEN PELVIS FINDINGS HEPATOBILIARY: Normal hepatic contours. No intra- or extrahepatic biliary dilatation. There is cholelithiasis with a hydropic  gallbladder. No evidence of acute inflammation. PANCREAS: Normal pancreas. No ductal dilatation or peripancreatic fluid collection. SPLEEN: Normal. ADRENALS/URINARY TRACT: The adrenal glands are normal. There is a stone within the proximal left ureter measuring 10 mm, causing mild hydroureteronephrosis and mild perinephric stranding. Additionally, there are multiple bilateral nonobstructing renal calculi that measure up to 6 mm. The urinary bladder is normal for degree of distention STOMACH/BOWEL: There is a small hiatal hernia. Normal duodenal course and caliber. No small bowel dilatation or inflammation. No focal colonic abnormality. Normal appendix. VASCULAR/LYMPHATIC: There is calcific atherosclerosis of the abdominal aorta. No abdominal or pelvic lymphadenopathy. REPRODUCTIVE: Normal uterus and ovaries. MUSCULOSKELETAL. No bony spinal canal stenosis or focal osseous abnormality. OTHER: None. IMPRESSION: 1. Left-sided obstructive uropathy with 10 mm stone in the proximal left ureter causing mild hydroureteronephrosis and perinephric stranding. 2. Multiple bilateral nonobstructing renal calculi measuring up to 6 mm. 3. Small pleural effusions with basilar atelectasis. 4. Cholelithiasis with hydropic gallbladder. No evidence of acute inflammation. 5. Aortic Atherosclerosis (ICD10-I70.0). Electronically Signed   By: Ulyses Jarred M.D.   On: 06/27/2019 22:09   CT CHEST WO CONTRAST  Result Date: 06/27/2019 CLINICAL DATA:  Sepsis EXAM: CT CHEST, ABDOMEN AND PELVIS WITHOUT CONTRAST TECHNIQUE: Multidetector CT imaging of the chest, abdomen and pelvis was performed following the standard protocol without IV contrast. COMPARISON:  None. FINDINGS: CT CHEST FINDINGS Cardiovascular: Heart size is normal. There are atherosclerotic calcifications of the thoracic aorta. Mediastinum/Nodes: Small hiatal hernia. No mediastinal or axillary lymphadenopathy. Lungs/Pleura: Small pleural effusions with basilar atelectasis. No other  consolidation. There is an incidentally noted azygos fissure. Central airways are patent. Musculoskeletal: No chest wall mass or suspicious bone lesions identified. CT ABDOMEN PELVIS FINDINGS HEPATOBILIARY: Normal hepatic contours. No intra- or extrahepatic biliary dilatation. There is cholelithiasis with a hydropic gallbladder. No evidence of acute inflammation. PANCREAS: Normal pancreas. No ductal dilatation or peripancreatic fluid collection. SPLEEN: Normal. ADRENALS/URINARY TRACT: The adrenal glands are normal. There is a stone within the proximal left ureter measuring 10 mm, causing mild hydroureteronephrosis and mild perinephric stranding. Additionally, there are multiple bilateral nonobstructing renal calculi that measure up to 6 mm. The urinary bladder is normal for degree of distention STOMACH/BOWEL: There is a small hiatal hernia. Normal duodenal course and caliber. No small bowel dilatation or inflammation. No focal colonic abnormality. Normal appendix. VASCULAR/LYMPHATIC: There is calcific atherosclerosis of the abdominal aorta. No abdominal or pelvic lymphadenopathy. REPRODUCTIVE: Normal uterus and ovaries. MUSCULOSKELETAL. No bony spinal canal stenosis or focal osseous abnormality. OTHER: None. IMPRESSION: 1. Left-sided obstructive uropathy with 10 mm stone in the proximal left ureter causing mild hydroureteronephrosis and perinephric stranding. 2. Multiple bilateral nonobstructing renal calculi measuring up to 6 mm. 3. Small pleural effusions with basilar atelectasis. 4. Cholelithiasis with hydropic gallbladder. No evidence of acute inflammation. 5. Aortic Atherosclerosis (ICD10-I70.0). Electronically Signed   By: Ulyses Jarred M.D.   On: 06/27/2019 22:09   US Aorta  Result Date: 06/27/2019 CLINICAL DATA:  Back pain, weakness, evaluate for abdominal aortic aneurysm EXAM: ULTRASOUND OF ABDOMINAL AORTA TECHNIQUE: Ultrasound examination of the abdominal aorta and proximal common iliac arteries was  performed to  evaluate for aneurysm. Additional color and Doppler images of the distal aorta were obtained to document patency. COMPARISON:  None. FINDINGS: Abdominal aortic measurements as follows: Proximal:  2.2 cm Mid:  1.8 cm Distal:  1.7 cm Patent: Yes, peak systolic velocity is 61 cm/s Right common iliac artery: Not visualized cm Left common iliac artery: Not visualized cm IMPRESSION: 1.  No abdominal aortic aneurysm identified. 2. Absence of aortic aneurysm by ultrasound does not exclude aortic dissection, intramural hematoma, or other acute aortic pathology. Consider CT angiogram to further evaluate if there is high clinical concern for aortic pathology in the setting of acute pain. Electronically Signed   By: Eddie Candle M.D.   On: 06/27/2019 17:03   DG Chest Port 1 View  Result Date: 06/27/2019 CLINICAL DATA:  LEFT-sided back pain EXAM: PORTABLE CHEST 1 VIEW COMPARISON:  None. FINDINGS: Normal cardiac silhouette. Mild venous congestion. Prominent azygos fissure. No pneumothorax. No pulmonary edema. No infiltrate. No acute osseous abnormality. IMPRESSION: Central venous congestion. Electronically Signed   By: Suzy Bouchard M.D.   On: 06/27/2019 15:02   US Abdomen Limited RUQ  Result Date: 06/27/2019 CLINICAL DATA:  Right upper quadrant pain. EXAM: ULTRASOUND ABDOMEN LIMITED RIGHT UPPER QUADRANT COMPARISON:  None. FINDINGS: Gallbladder: Multiple gallstones evident, measuring up to 3.3 cm diameter. Gallbladder wall thickness upper normal at 3 mm. No pericholecystic fluid. Sonographer reports no sonographic Murphy sign. Common bile duct: Diameter: 8 mm common duct diameter in the hepatoduodenal ligament. Liver: No focal abnormality evident with subtle increase in parenchymal echotexture suggesting component of fatty deposition. Portal vein is patent on color Doppler imaging with normal direction of blood flow towards the liver. Other: None. IMPRESSION: Cholelithiasis with mild extrahepatic biliary  duct distension. Correlation with liver function test recommended. Gallbladder wall thickness upper normal without pericholecystic fluid or sonographic Murphy sign. Electronically Signed   By: Misty Stanley M.D.   On: 06/27/2019 17:07    Labs:  CBC: Recent Labs    11/15/18 0954 06/26/19 2114 06/27/19 1349 06/27/19 1904 06/28/19 0549  WBC 6.9 4.2 5.9  --  20.2*  HGB 13.4 13.6 14.2  --  10.9*  HCT 39.8 42.3 44.0  --  33.5*  PLT 307 280 89* 80* 67*    COAGS: Recent Labs    06/27/19 1349 06/27/19 1904  INR 1.7* 1.6*  APTT 60* 51*    BMP: Recent Labs    06/26/19 2114 06/27/19 1349 06/27/19 1904 06/28/19 0549  NA 141 141 142 141  K 3.7 3.1* 3.0* 3.8  CL 112* 112* 113* 110  CO2 17* 14* 14* 14*  GLUCOSE 107* 115* 93 97  BUN 22 37* 38* 49*  CALCIUM 9.0 8.9 7.8* 7.4*  CREATININE 1.44* 3.13* 3.10* 3.80*  GFRNONAA 38* 15* 15* 12*  GFRAA 44* 17* 18* 14*    LIVER FUNCTION TESTS: Recent Labs    06/26/19 2114 06/27/19 1349 06/27/19 1904 06/28/19 0549  BILITOT 0.6 2.1* 1.2 0.8  AST 29 43* 36 33  ALT 23 36 27 26  ALKPHOS 132* 200* 116 92  PROT 7.1 7.1 5.4* 5.5*  ALBUMIN 3.8 3.5 2.6* 2.6*    Assessment and Plan:  Patient with 3-day history of nausea, vomiting and severe left-sided flank pain. CT scan of the abdomen and pelvis revealed a 10 mm obstructing left proximal ureteral calculus. Patient with hypotension and tachycardia consistent with sepsis and requiring pressor support. The patient is scheduled for a left nephrostomy placement in interventional radiology today, to be  placed by Dr. Jacqulynn Cadet. Imaging studies were reviewed by [place]. Definitive stone management will be deferred until her infection has been cleared. Antibiotics will be managed by primary team.   This plan was discussed with the patient and consent was obtained. Risks and benefits of this procedure, including but not limited to internal bleeding, infection and sedation-related  complications were discussed with patient, patient verbalized understanding of risks and benefits.   Procedure scheduled for today in Interventional Radiology.   Electronically Signed: Theresa Duty, NP 06/28/2019, 9:48 AM   I spent a total of 30 min at the the patient's bedside AND on the patient's hospital floor or unit, greater than 50% of which was counseling/coordinating care for placement of a left percutaneous nephrostomy tube.

## 2019-06-28 NOTE — Procedures (Signed)
Interventional Radiology Procedure Note  Procedure: Left 1F percutaneous nephrostomy tube placement.   Complications: None  Estimated Blood Loss: None  Recommendations: - Tube to gravity drainage    Signed,  Criselda Peaches, MD

## 2019-06-28 NOTE — Progress Notes (Signed)
MEDICATION-RELATED CONSULT NOTE   IR Procedure Consult - Anticoagulant/Antiplatelet PTA/Inpatient Med List Review by Pharmacist    Procedure: Left 72F percutaneous nephrostomy tube placement.     Completed: 4/21 1600  Post-Procedural bleeding risk per IR MD assessment:  standard  Antithrombotic medications on inpatient or PTA profile prior to procedure:  none    Recommended restart time per IR Post-Procedure Guidelines: N/A    Other considerations:      Plan:    Thrombocytopenia new 06/27/2019 P: DVT prophylaxis with SCD  Amber Bowman, Pharm.D 06/28/2019 4:26 PM

## 2019-06-28 NOTE — Consult Note (Signed)
Renal Service Consult Note [facility]  Citalli Kopcho 06/28/2019 Sol Blazing Requesting Physician:  Dr Lake Bells  Reason for Consult:  AKI HPI: The patient is a 64 y.o. year-old with hx of seizures, depression admitted yesterday 4/20 w/ L flank pain, hypotension, N/V.  Rec'd 3L NS and then required pressor support.  CT abd showed L ureteral stone w/ L hydro, normal R kidney.  Today creat is rising despite 5 L + yesterday w/ no UOP.  Pt also tested + for COVID-19 but is w/o symptoms. Asked to see for AKI.    Pt lethargic but Ox3.  No c/o today.  BP's 80's, on pressor support. Poor historian. +dysuria prior to admission.    ROS  denies CP  no joint pain   no HA  no blurry vision  no rash    Past Medical History  Past Medical History:  Diagnosis Date  . Colon polyps 2014  . Condyloma   . Depression   . Epilepsy ([place])   . Osteopenia 04/2018   T score -1.8 FRAX 9.5% / 1.1% overall stable from prior DEXA  . Seizures [facility])    Past Surgical History  Past Surgical History:  Procedure Laterality Date  . CATARACT EXTRACTION  03/2015   Family History  Family History  Problem Relation Age of Onset  . Hypertension Mother   . Diabetes Mother   . Heart disease Mother   . Hypertension Father   . Heart disease Father   . Diabetes Sister   . Hypertension Sister   . Heart disease Sister   . Seizures Neg Hx    Social History  reports that she has never smoked. She has never used smokeless tobacco. She reports that she does not drink alcohol or use drugs. Allergies No Known Allergies Home medications Prior to Admission medications   Medication Sig Start Date End Date Taking? Authorizing Provider  aspirin 81 MG tablet Take 81 mg by mouth daily.      [provider]  betamethasone valerate ointment (VALISONE) 0.1 % Apply 1 application topically 2 (two) times daily. 05/26/19   Princess Bruins, MD  CRANBERRY PO Take 2 tablets by mouth daily.     [provider]  felbamate (FELBATOL) 600 MG tablet TAKE 2 TABLETS BY MOUTH 4 TIMES A DAY Patient taking differently: Take 1,200 mg by mouth 4 (four) times daily.  06/07/19   Ward Givens, NP  levETIRAcetam (KEPPRA) 500 MG tablet TAKE 1 TABLET BY MOUTH TWICE A DAY Patient taking differently: Take 500 mg by mouth 2 (two) times daily.  03/27/19   Ward Givens, NP  metroNIDAZOLE (METROGEL) 0.75 % vaginal gel Place 1 Applicatorful vaginally 2 (two) times daily. 05/26/19   Princess Bruins, MD  Multiple Vitamins-Minerals (ICAPS AREDS FORMULA PO) Take by mouth.    [provider]  topiramate (TOPAMAX) 200 MG tablet TAKE 1 TABLET BY MOUTH TWICE A DAY Patient taking differently: Take 200 mg by mouth 2 (two) times daily.  04/14/19   Ward Givens, NP     Vitals:   06/28/19 0630 06/28/19 0700 06/28/19 0738 06/28/19 0800  BP:    (!) 83/40  Pulse: (!) 112 (!) 112  (!) 110  Resp: (!) 25 (!) 26  (!) 27  Temp:   (!) 97.5 F (36.4 C)   TempSrc:   Oral   SpO2: 92% 93%  96%  Weight:      Height:       Exam Gen  alert, in ICU, not in distress No rash, cyanosis or gangrene Sclera anicteric, throat clear  No jvd, flat neck veins Chest hard to hear BS w/ fan noise Cor not able to discern HS Abd soft ntnd no mass or ascites +bs GU defer MS no joint effusions or deformity Ext no LE or UE edema, no wounds or ulcers Neuro is alert, Ox 3 , nf     Home meds:   Neuro - keppra 500 bid, topamax 200 bid, felbamate 1200 qid   Other - ASA 81 qd    Prn's/ vitamins/ supplements     UA - > 50 rbc, 11-20 wbc, few bact, neg protein   Assessment/ Plan: 1. Renal failure - acute / AKI due to shock most likely.  Has L hydro w/ ureteral stone which may explain shock d/t comp UTI.  Has room for fluids, will cont NS at 100 cc/ hr for now.  Otherwise Rx is supportive care. Place foley, treat infection.  No indication for RRT at this time.  2. Shock / sepsis - prob UTI w/ L ureteral  obstruction 3. Seizure d/o - on several meds 4. Hypotension - per CCM 5. +COVID-19 - asymptomatic      Rob Nicholl Onstott  MD 06/28/2019, 8:29 AM  Recent Labs  Lab 06/27/19 1349 06/28/19 0549  WBC 5.9 20.2*  HGB 14.2 10.9*   Recent Labs  Lab 06/26/19 2114 06/26/19 2114 06/27/19 1349 06/27/19 1349 06/27/19 1904 06/28/19 0549  K 3.7   < > 3.1*   < > 3.0* 3.8  BUN 22   < > 37*   < > 38* 49*  CREATININE 1.44*   < > 3.13*   < > 3.10* 3.80*  CALCIUM 9.0  --  8.9  --  7.8* 7.4*  PHOS  --   --   --   --   --  4.0   < > = values in this interval not displayed.

## 2019-06-28 NOTE — Progress Notes (Signed)
eLink Physician-Brief Progress Note Patient Name: Amber Bowman DOB: 1955/07/16 MRN: [id number]   Date of Service  06/28/2019  HPI/Events of Note  Pt with persistent nausea despite Zofran.  eICU Interventions  Phenergan 12.5 mg iv Q 6 hours prn nausea or vomiting ordered.        [name] [name] 06/28/2019, 11:53 PM

## 2019-06-28 NOTE — Progress Notes (Signed)
ANTICOAGULATION CONSULT NOTE - Initial Consult  Pharmacy Consult for heparin Indication: Elevated D-dimer with COVID, would like heparin drip without a bolus please  No Known Allergies  Patient Measurements: Height: 5\' 4"  (162.6 cm) Weight: 77.5 kg (170 lb 13.7 oz) IBW/kg (Calculated) : 54.7 Heparin Dosing Weight: 70.5 kg  Vital Signs: Temp: 97.5 F (36.4 C) (04/21 0738) Temp Source: Oral (04/21 0738) BP: 110/58 (04/21 1528) Pulse Rate: 102 (04/21 1630)  Labs: Recent Labs    06/26/19 2114 06/26/19 2114 06/27/19 1349 06/27/19 1349 06/27/19 1904 06/28/19 0549 06/28/19 1314 06/28/19 1617  HGB 13.6   < > 14.2  --   --  10.9*  --   --   HCT 42.3  --  44.0  --   --  33.5*  --   --   PLT 280   < > 89*  --  80* 67*  --   --   APTT  --   --  60*  --  51*  --   --   --   LABPROT  --   --  19.6*  --  19.1*  --  18.8*  --   INR  --   --  1.7*  --  1.6*  --  1.6*  --   CREATININE 1.44*   < > 3.13*   < > 3.10* 3.80*  --  4.12*  TROPONINIHS 5  --   --   --   --   --   --   --    < > = values in this interval not displayed.    Estimated Creatinine Clearance: 13.9 mL/min (A) (by C-G formula based on SCr of 4.12 mg/dL (H)).   Medical History: Past Medical History:  Diagnosis Date  . Colon polyps 2014  . Condyloma   . Depression   . Epilepsy ([place])   . Osteopenia 04/2018   T score -1.8 FRAX 9.5% / 1.1% overall stable from prior DEXA  . Seizures ([place])     Medications:  Medications Prior to Admission  Medication Sig Dispense Refill Last Dose  . aspirin 81 MG tablet Take 81 mg by mouth daily.       . betamethasone valerate ointment (VALISONE) 0.1 % Apply 1 application topically 2 (two) times daily. 30 g 1   . CRANBERRY PO Take 2 tablets by mouth daily.      . felbamate (FELBATOL) 600 MG tablet TAKE 2 TABLETS BY MOUTH 4 TIMES A DAY (Patient taking differently: Take 1,200 mg by mouth 4 (four) times daily. ) 240 tablet 6   . levETIRAcetam (KEPPRA) 500 MG tablet TAKE 1 TABLET BY  MOUTH TWICE A DAY (Patient taking differently: Take 500 mg by mouth 2 (two) times daily. ) 180 tablet 2   . metroNIDAZOLE (METROGEL) 0.75 % vaginal gel Place 1 Applicatorful vaginally 2 (two) times daily. 70 g 0   . Multiple Vitamins-Minerals (ICAPS AREDS FORMULA PO) Take by mouth.     . topiramate (TOPAMAX) 200 MG tablet TAKE 1 TABLET BY MOUTH TWICE A DAY (Patient taking differently: Take 200 mg by mouth 2 (two) times daily. ) 180 tablet 3     Assessment: Pharmacy consulted for heparin drip for "Elevated D-dimer with COVID, would like heparin drip without a bolus please".   Pt currently on SCDs for VTE px due to new onset thrombocytopenia 4/20.   SCr 4.12 INR elevated at 1.6 , Hg 13.6> 14.2> 10.9, PLTC 280> 89>80>67 D dimer > 20  today s/p percutaneous placement of nephrostomy tube for obstructing renal calculi without any acute complications.  2,400 units heparin given today at 1151 w/ HD catheter placement   Goal of Therapy:  Heparin level 0.3-0.7 units/ml Monitor platelets by anticoagulation protocol: Yes   Plan:  No bolus Start heparin drip at  Check 8 hr heparin level  Daily heparin level and CBC while on heparin  Eudelia Bunch, Pharm.D 06/28/2019 5:18 PM

## 2019-06-29 ENCOUNTER — Inpatient Hospital Stay (HOSPITAL_COMMUNITY): Payer: Medicare Other

## 2019-06-29 DIAGNOSIS — G9340 Encephalopathy, unspecified: Secondary | ICD-10-CM

## 2019-06-29 DIAGNOSIS — N12 Tubulo-interstitial nephritis, not specified as acute or chronic: Secondary | ICD-10-CM | POA: Diagnosis not present

## 2019-06-29 DIAGNOSIS — R7881 Bacteremia: Secondary | ICD-10-CM | POA: Diagnosis not present

## 2019-06-29 DIAGNOSIS — B9620 Unspecified Escherichia coli [E. coli] as the cause of diseases classified elsewhere: Secondary | ICD-10-CM

## 2019-06-29 DIAGNOSIS — A419 Sepsis, unspecified organism: Secondary | ICD-10-CM | POA: Diagnosis not present

## 2019-06-29 DIAGNOSIS — R6521 Severe sepsis with septic shock: Secondary | ICD-10-CM

## 2019-06-29 DIAGNOSIS — N179 Acute kidney failure, unspecified: Secondary | ICD-10-CM | POA: Diagnosis not present

## 2019-06-29 DIAGNOSIS — E162 Hypoglycemia, unspecified: Secondary | ICD-10-CM

## 2019-06-29 LAB — COMPREHENSIVE METABOLIC PANEL
ALT: 37 U/L (ref 0–44)
AST: 61 U/L — ABNORMAL HIGH (ref 15–41)
Albumin: 2.3 g/dL — ABNORMAL LOW (ref 3.5–5.0)
Alkaline Phosphatase: 123 U/L (ref 38–126)
Anion gap: 19 — ABNORMAL HIGH (ref 5–15)
BUN: 58 mg/dL — ABNORMAL HIGH (ref 8–23)
CO2: 17 mmol/L — ABNORMAL LOW (ref 22–32)
Calcium: 6.7 mg/dL — ABNORMAL LOW (ref 8.9–10.3)
Chloride: 102 mmol/L (ref 98–111)
Creatinine, Ser: 4.69 mg/dL — ABNORMAL HIGH (ref 0.44–1.00)
GFR calc Af Amer: 11 mL/min — ABNORMAL LOW (ref >60)
GFR calc non Af Amer: 9 mL/min — ABNORMAL LOW (ref >60)
Glucose, Bld: 131 mg/dL — ABNORMAL HIGH (ref 70–99)
Potassium: 3.9 mmol/L (ref 3.5–5.1)
Sodium: 138 mmol/L (ref 135–145)
Total Bilirubin: 1 mg/dL (ref 0.3–1.2)
Total Protein: 5.2 g/dL — ABNORMAL LOW (ref 6.5–8.1)

## 2019-06-29 LAB — RENAL FUNCTION PANEL
Albumin: 2.3 g/dL — ABNORMAL LOW (ref 3.5–5.0)
Anion gap: 12 (ref 5–15)
BUN: 46 mg/dL — ABNORMAL HIGH (ref 8–23)
CO2: 20 mmol/L — ABNORMAL LOW (ref 22–32)
Calcium: 6.9 mg/dL — ABNORMAL LOW (ref 8.9–10.3)
Chloride: 104 mmol/L (ref 98–111)
Creatinine, Ser: 3.38 mg/dL — ABNORMAL HIGH (ref 0.44–1.00)
GFR calc Af Amer: 16 mL/min — ABNORMAL LOW (ref >60)
GFR calc non Af Amer: 14 mL/min — ABNORMAL LOW (ref >60)
Glucose, Bld: 139 mg/dL — ABNORMAL HIGH (ref 70–99)
Phosphorus: 3.4 mg/dL (ref 2.5–4.6)
Potassium: 3.8 mmol/L (ref 3.5–5.1)
Sodium: 136 mmol/L (ref 135–145)

## 2019-06-29 LAB — PHOSPHORUS
Phosphorus: 4.6 mg/dL (ref 2.5–4.6)
Phosphorus: 3.4 mg/dL (ref 2.5–4.6)

## 2019-06-29 LAB — GLUCOSE, CAPILLARY
Glucose-Capillary: 100 mg/dL — ABNORMAL HIGH (ref 70–99)
Glucose-Capillary: 119 mg/dL — ABNORMAL HIGH (ref 70–99)
Glucose-Capillary: 129 mg/dL — ABNORMAL HIGH (ref 70–99)
Glucose-Capillary: 133 mg/dL — ABNORMAL HIGH (ref 70–99)
Glucose-Capillary: 63 mg/dL — ABNORMAL LOW (ref 70–99)
Glucose-Capillary: 72 mg/dL (ref 70–99)
Glucose-Capillary: 91 mg/dL (ref 70–99)
Glucose-Capillary: 99 mg/dL (ref 70–99)

## 2019-06-29 LAB — D-DIMER, QUANTITATIVE: D-Dimer, Quant: >20 ug/mL-FEU — ABNORMAL HIGH (ref 0.00–0.50)

## 2019-06-29 LAB — CBC WITH DIFFERENTIAL/PLATELET
Abs Immature Granulocytes: 2.37 10*3/uL — ABNORMAL HIGH (ref 0.00–0.07)
Basophils Absolute: 0.1 10*3/uL (ref 0.0–0.1)
Basophils Relative: 0 %
Eosinophils Absolute: 0 10*3/uL (ref 0.0–0.5)
Eosinophils Relative: 0 %
HCT: 30 % — ABNORMAL LOW (ref 36.0–46.0)
Hemoglobin: 10.4 g/dL — ABNORMAL LOW (ref 12.0–15.0)
Immature Granulocytes: 7 %
Lymphocytes Relative: 5 %
Lymphs Abs: 1.6 10*3/uL (ref 0.7–4.0)
MCH: 32.6 pg (ref 26.0–34.0)
MCHC: 34.7 g/dL (ref 30.0–36.0)
MCV: 94 fL (ref 80.0–100.0)
Monocytes Absolute: 1.5 10*3/uL — ABNORMAL HIGH (ref 0.1–1.0)
Monocytes Relative: 5 %
Neutro Abs: 27.7 10*3/uL — ABNORMAL HIGH (ref 1.7–7.7)
Neutrophils Relative %: 83 %
Platelets: 61 10*3/uL — ABNORMAL LOW (ref 150–400)
RBC: 3.19 MIL/uL — ABNORMAL LOW (ref 3.87–5.11)
RDW: 14.6 % (ref 11.5–15.5)
WBC: 33.5 10*3/uL — ABNORMAL HIGH (ref 4.0–10.5)
nRBC: 0.1 % (ref 0.0–0.2)

## 2019-06-29 LAB — URINE CULTURE: Culture: NO GROWTH

## 2019-06-29 LAB — PROCALCITONIN: Procalcitonin: <0.1 ng/mL

## 2019-06-29 LAB — HEPARIN LEVEL (UNFRACTIONATED)
Heparin Unfractionated: 0.22 IU/mL — ABNORMAL LOW (ref 0.30–0.70)
Heparin Unfractionated: 0.22 IU/mL — ABNORMAL LOW (ref 0.30–0.70)
Heparin Unfractionated: 0.3 IU/mL (ref 0.30–0.70)

## 2019-06-29 LAB — BLOOD GAS, ARTERIAL
Acid-base deficit: 6.2 mmol/L — ABNORMAL HIGH (ref 0.0–2.0)
Bicarbonate: 18 mmol/L — ABNORMAL LOW (ref 20.0–28.0)
O2 Saturation: 95.1 %
Patient temperature: 98.6
pCO2 arterial: 32.5 mmHg (ref 32.0–48.0)
pH, Arterial: 7.361 (ref 7.350–7.450)
pO2, Arterial: 78 mmHg — ABNORMAL LOW (ref 83.0–108.0)

## 2019-06-29 LAB — STREP PNEUMONIAE URINARY ANTIGEN: Strep Pneumo Urinary Antigen: NEGATIVE

## 2019-06-29 LAB — MAGNESIUM
Magnesium: 1.3 mg/dL — ABNORMAL LOW (ref 1.7–2.4)
Magnesium: 2.7 mg/dL — ABNORMAL HIGH (ref 1.7–2.4)

## 2019-06-29 LAB — POTASSIUM: Potassium: 3.8 mmol/L (ref 3.5–5.1)

## 2019-06-29 MED ORDER — SODIUM CHLORIDE 0.9 % FOR CRRT
INTRAVENOUS_CENTRAL | Status: DC | PRN
  Administered 2019-06-29: 10:00:00 2000 mL via INTRAVENOUS_CENTRAL

## 2019-06-29 MED ORDER — ALTEPLASE 2 MG IJ SOLR: 2.0000 mg | Freq: Once | INTRAMUSCULAR | Status: DC | PRN

## 2019-06-29 MED ORDER — FENTANYL CITRATE (PF) 100 MCG/2ML IJ SOLN
25.0000 ug | INTRAMUSCULAR | Status: DC | PRN
  Administered 2019-06-29 – 2019-07-11 (×41): 25 ug via INTRAVENOUS
  Filled 2019-06-29 (×41): qty 2

## 2019-06-29 MED ORDER — LORAZEPAM 2 MG/ML IJ SOLN
1.0000 mg | Freq: Once | INTRAMUSCULAR | Status: AC
  Administered 2019-06-29: 16:00:00 1 mg via INTRAVENOUS
  Filled 2019-06-29: qty 1

## 2019-06-29 MED ORDER — CALCIUM GLUCONATE-NACL 1-0.675 GM/50ML-% IV SOLN
1.0000 g | Freq: Once | INTRAVENOUS | Status: AC
  Administered 2019-06-29: 15:00:00 1000 mg via INTRAVENOUS
  Filled 2019-06-29: qty 50

## 2019-06-29 MED ORDER — CALCIUM GLUCONATE-NACL 1-0.675 GM/50ML-% IV SOLN
1.0000 g | Freq: Once | INTRAVENOUS | Status: AC
  Administered 2019-06-29: 07:00:00 1000 mg via INTRAVENOUS
  Filled 2019-06-29: qty 50

## 2019-06-29 MED ORDER — HEPARIN SODIUM (PORCINE) 1000 UNIT/ML DIALYSIS
1000.0000 [IU] | INTRAMUSCULAR | Status: DC | PRN
  Administered 2019-07-02: 1200 [IU] via INTRAVENOUS_CENTRAL
  Filled 2019-06-29 (×2): qty 6

## 2019-06-29 MED ORDER — PRISMASOL BGK 4/2.5 32-4-2.5 MEQ/L IV SOLN: INTRAVENOUS | Status: DC

## 2019-06-29 MED ORDER — PRISMASOL BGK 4/2.5 32-4-2.5 MEQ/L REPLACEMENT SOLN: Status: DC

## 2019-06-29 MED ORDER — DEXTROSE 50 % IV SOLN
INTRAVENOUS | Status: AC
  Filled 2019-06-29: qty 50

## 2019-06-29 MED ORDER — PRO-STAT SUGAR FREE PO LIQD
30.0000 mL | Freq: Two times a day (BID) | ORAL | Status: DC
  Administered 2019-06-29 – 2019-07-06 (×16): 30 mL
  Filled 2019-06-29 (×17): qty 30

## 2019-06-29 MED ORDER — DEXTROSE 10 % IV SOLN: INTRAVENOUS | Status: DC

## 2019-06-29 MED ORDER — MAGNESIUM SULFATE 4 GM/100ML IV SOLN
4.0000 g | Freq: Once | INTRAVENOUS | Status: AC
  Administered 2019-06-29: 09:00:00 4 g via INTRAVENOUS
  Filled 2019-06-29: qty 100

## 2019-06-29 MED ORDER — NOREPINEPHRINE 16 MG/250ML-% IV SOLN
0.0000 ug/min | INTRAVENOUS | Status: DC
  Administered 2019-06-29: 14 ug/min via INTRAVENOUS
  Administered 2019-06-29: 28 ug/min via INTRAVENOUS
  Administered 2019-06-29: 19 ug/min via INTRAVENOUS
  Filled 2019-06-29 (×4): qty 250

## 2019-06-29 MED ORDER — LEVETIRACETAM IN NACL 500 MG/100ML IV SOLN
500.0000 mg | Freq: Two times a day (BID) | INTRAVENOUS | Status: DC
  Administered 2019-06-29 – 2019-07-01 (×6): 500 mg via INTRAVENOUS
  Filled 2019-06-29 (×7): qty 100

## 2019-06-29 MED ORDER — FENTANYL CITRATE (PF) 100 MCG/2ML IJ SOLN
INTRAMUSCULAR | Status: AC
  Filled 2019-06-29: qty 2

## 2019-06-29 MED ORDER — MAGNESIUM SULFATE 2 GM/50ML IV SOLN
2.0000 g | Freq: Once | INTRAVENOUS | Status: AC
  Administered 2019-06-29: 08:00:00 2 g via INTRAVENOUS
  Filled 2019-06-29: qty 50

## 2019-06-29 MED ORDER — VITAL HIGH PROTEIN PO LIQD
1000.0000 mL | ORAL | Status: DC
  Administered 2019-06-29: 1000 mL

## 2019-06-29 MED ORDER — DEXMEDETOMIDINE HCL IN NACL 200 MCG/50ML IV SOLN
0.2000 ug/kg/h | INTRAVENOUS | Status: DC
  Administered 2019-06-29: 0.2 ug/kg/h via INTRAVENOUS
  Administered 2019-06-29: 0.6 ug/kg/h via INTRAVENOUS
  Administered 2019-06-29: 0.2 ug/kg/h via INTRAVENOUS
  Administered 2019-06-29: 0.5 ug/kg/h via INTRAVENOUS
  Administered 2019-06-30: 0.3 ug/kg/h via INTRAVENOUS
  Administered 2019-07-01: 0.4 ug/kg/h via INTRAVENOUS
  Filled 2019-06-29 (×2): qty 50
  Filled 2019-06-29: qty 100
  Filled 2019-06-29 (×2): qty 50

## 2019-06-29 MED ORDER — SODIUM CHLORIDE 0.9 % IV SOLN: 6.0000 g | Freq: Once | INTRAVENOUS | Status: DC

## 2019-06-29 NOTE — Progress Notes (Signed)
[facility] Progress Note Patient Name: Amber Bowman DOB: [birth year] MRN: [id number]   Date of Service  06/29/2019  HPI/Events of Note  Mg++ 1.3, Ca++ 6.7  eICU Interventions  Magnesium sulfate 6 gm iv x 1 per protocol, Calcium gluconate 1 gm iv x 1        Amber Bowman U Melchizedek Espinola 06/29/2019, 6:00 AM

## 2019-06-29 NOTE — Progress Notes (Signed)
ANTICOAGULATION CONSULT NOTE - Follow Up Consult  Pharmacy Consult for Heparin Indication: Elevated D-dimer with COVID, would like heparin drip without a bolus please  No Known Allergies  Patient Measurements: Height: 5\' 4"  ([id number] cm) Weight: 77.5 kg (170 lb 13.7 oz) IBW/kg (Calculated) : 54.7 Heparin Dosing Weight:   Vital Signs: Temp: 97.7 F (36.5 C) (04/21 2100) Temp Source: Axillary (04/21 2100) BP: 125/63 (04/22 0400) Pulse Rate: 95 (04/22 0446)  Labs: Recent Labs    06/26/19 2114 06/26/19 2114 06/27/19 1349 06/27/19 1349 06/27/19 1904 06/28/19 0549 06/28/19 1314 06/28/19 1617 06/29/19 0250 06/29/19 0353  HGB 13.6   < > 14.2   < >  --  10.9*  --   --   --  10.4*  HCT 42.3   < > 44.0  --   --  33.5*  --   --   --  30.0*  PLT 280   < > 89*   < > 80* 67*  --   --   --  61*  APTT  --   --  60*  --  51*  --   --   --   --   --   LABPROT  --   --  19.6*  --  19.1*  --  18.8*  --   --   --   INR  --   --  1.7*  --  1.6*  --  1.6*  --   --   --   HEPARINUNFRC  --   --   --   --   --   --   --   --  0.22*  --   CREATININE 1.44*   < > 3.13*   < > 3.10* 3.80*  --  4.12*  --  4.69*  TROPONINIHS 5  --   --   --   --   --   --   --   --   --    < > = values in this interval not displayed.    Estimated Creatinine Clearance: 12.2 mL/min (A) (by C-G formula based on SCr of 4.69 mg/dL (H)).   Medications:  Infusions:  . sodium chloride    . sodium chloride Stopped (06/29/19 0014)  . cefTRIAXone (ROCEPHIN)  IV Stopped (06/28/19 2353)  . dextrose 50 mL/hr at 06/29/19 0400  . heparin 1,000 Units/hr (06/29/19 0400)  . norepinephrine (LEVOPHED) Adult infusion 24 mcg/min (06/29/19 0359)  . remdesivir 100 mg in NS 100 mL Stopped (06/28/19 1054)  .  sodium bicarbonate (isotonic) infusion in sterile water 100 mL/hr at 06/29/19 0400  . vasopressin (PITRESSIN) infusion - *FOR SHOCK* 0.03 Units/min (06/29/19 0400)    Assessment: Patient with low heparin level.  Per RN, patient  was moving arm and causing IV heparin drip to pause/stop.  RN moved heparin to new site ~0300.   No other heparin issues per RN.  Goal of Therapy:  Heparin level 0.3-0.7 units/ml Monitor platelets by anticoagulation protocol: Yes   Plan:  Continue heparin drip at current rate Recheck level at [address] [place] 06/29/2019,5:01 AM

## 2019-06-29 NOTE — Progress Notes (Signed)
ANTICOAGULATION CONSULT NOTE - Follow Up Consult  Pharmacy Consult for Heparin Indication: Elevated D-dimer with COVID  No Known Allergies  Patient Measurements: Height: 5\' 4"  (162.6 cm) Weight: 77.5 kg (170 lb 13.7 oz) IBW/kg (Calculated) : 54.7 Heparin Dosing Weight:   Vital Signs: Temp: 93.4 F (34.1 C) (04/22 1600) Temp Source: Axillary (04/22 1600) BP: 79/48 (04/22 1800) Pulse Rate: 71 (04/22 2300)  Labs: Recent Labs    06/27/19 1349 06/27/19 1349 06/27/19 1904 06/28/19 0549 06/28/19 0549 06/28/19 1314 06/28/19 1617 06/29/19 0250 06/29/19 0353 06/29/19 1130 06/29/19 1600 06/29/19 2140  HGB 14.2   < >  --  10.9*  --   --   --   --  10.4*  --   --   --   HCT 44.0  --   --  33.5*  --   --   --   --  30.0*  --   --   --   PLT 89*   < > 80* 67*  --   --   --   --  61*  --   --   --   APTT 60*  --  51*  --   --   --   --   --   --   --   --   --   LABPROT 19.6*  --  19.1*  --   --  18.8*  --   --   --   --   --   --   INR 1.7*  --  1.6*  --   --  1.6*  --   --   --   --   --   --   HEPARINUNFRC  --   --   --   --   --   --   --  0.22*  --  0.22*  --  0.30  CREATININE 3.13*   < > 3.10* 3.80*   < >  --  4.12*  --  4.69*  --  3.38*  --    < > = values in this interval not displayed.    Estimated Creatinine Clearance: 16.9 mL/min (A) (by C-G formula based on SCr of 3.38 mg/dL (H)).   Medications:  Infusions:  .  prismasol BGK 4/2.5 400 mL/hr at 06/29/19 2208  .  prismasol BGK 4/2.5 200 mL/hr at 06/29/19 0937  . sodium chloride    . sodium chloride 500 mL (06/29/19 2100)  . cefTRIAXone (ROCEPHIN)  IV Stopped (06/28/19 2353)  . dexmedetomidine (PRECEDEX) IV infusion 0.2 mcg/kg/hr (06/29/19 2300)  . dextrose 50 mL/hr at 06/29/19 2300  . heparin 1,150 Units/hr (06/29/19 2300)  . levETIRAcetam Stopped (06/29/19 2134)  . norepinephrine (LEVOPHED) Adult infusion 9 mcg/min (06/29/19 2300)  . prismasol BGK 4/2.5 1,800 mL/hr at 06/29/19 2020  . remdesivir 100 mg in NS  100 mL Stopped (06/29/19 1237)  . vasopressin (PITRESSIN) infusion - *FOR SHOCK* 0.03 Units/min (06/29/19 2300)    Assessment: Patient with heparin level at goal, but lowest end of goal.  No heparin issues per RN.  Goal of Therapy:  Heparin level 0.3-0.7 units/ml Monitor platelets by anticoagulation protocol: Yes   Plan:  Increase heparin to 1250 units/hr Recheck level at 0900  Nani Skillern [place] 06/29/2019,11:44 PM

## 2019-06-29 NOTE — Progress Notes (Signed)
NAME:  Amber Bowman, MRN:  [id number], DOB:  10/22/1955, LOS: 2 ADMISSION DATE:  06/27/2019, CONSULTATION DATE:  06/27/2019  REFERRING MD:  Dr Varney Baas ER, CHIEF COMPLAINT:  Acute renal failure, shoock, lactic acidosis, Covid-19   Brief History   Presented with CC of shortness of breath coupled with nausea, vomiting, diarrhea, and left flank pain. Found to be COVID positive with urosepsis from pyelonephritis and obstructive uropathy.   History of present illness   64 year old relatively healthy female who has not yet been vaccinated for COVID-19.  Presented on June 26, 2019 with SIRS physiology fever 101.3, heart rate 140 and left-sided flank pain and self-reported dyspnea due to anxiety.  Had a lactic acid of 2.0, mild acute kidney injury with creatinine 1.7 and a normal white count.  She waited in the ER for 4 hours apparently and then left.  However on June 27, 2018 when she returned with nonspecific symptoms found to be hypotensive.  Per ER physician patient has had significant amount of vomiting and nausea and poor p.o. intake.  No NSAID use.  She has required 3 L of fluid and low-dose Levophed through peripheral infusion to obtain a made arterial pressure of 65 and a systolic blood pressure greater than 75.  Pulse ox reported as normal throughout the course of the stay and no respiratory issues but at time of PCCM evaluation pulse ox 94% on 2 L nasal cannula.  Mentating quite well but feeling cold.  White count low-normal.  Chest x-ray fairly clear.  Creatinine 3.13 mg percent and a lactic acid of greater than 4 mg percent.  Critical care medicine asked admit the patient.  Bedside ultrasound showed evidence of gallstones and mild biliary duct dilatation.  CC is Dr. Barry Dienes has been called by ER.  Liver function test normal other than elevated alkaline phosphatase 200.  Urine analysis on June 26, 2019: Red cells greater than 50 and white cells greater than 11 with positive nitrates  and leukocytes.  Suggestive of UTI  No significant Covid history other than age greater than 65.  She is Covid positive on testing in the ER  Past medical history positive for epilepsy/seizures on Felbatol, topiramate and Keppra  Past Medical History    has a past medical history of Colon polyps (2014), Condyloma, Depression, Epilepsy ([facility]), Osteopenia (04/2018), and Seizures ([place]).   reports that she has never smoked. She has never used smokeless tobacco.  Significant Hospital Events   4/20 Admit 4/21 percutaneous placement of nephrostomy tube   Consults:  Nephrology 4/21 Urology 4/21 IR 4/21  Procedures:  4/20 Arterial line planned 4/21 left nephrostomy tube placed bu IR   Significant Diagnostic Tests:  CT chest/abdomen/pelvis non contrast 4/20 > 1. Left-sided obstructive uropathy with 10 mm stone in the proximal left ureter causing mild hydroureteronephrosis and perinephric stranding. 2. Multiple bilateral nonobstructing renal calculi measuring up to 6 mm. 3. Small pleural effusions with basilar atelectasis. 4. Cholelithiasis with hydropic gallbladder. No evidence of acute inflammation. 5. Aortic Atherosclerosis (ICD10-I70.0).  Chest X-ray 4/20 > Central venous congestion.  US Aorta 4/20 >  1.  No abdominal aortic aneurysm identified. 2. Absence of aortic aneurysm by ultrasound does not exclude aortic dissection, intramural hematoma, or other acute aortic pathology. Consider CT angiogram to further evaluate if there is high clinical concern for aortic pathology in the setting of acute pain.  US Abdomen 4/20 >  Cholelithiasis with mild extrahepatic biliary duct distension. Correlation with liver function test  recommended.  Gallbladder wall thickness upper normal without pericholecystic fluid or sonographic Murphy sign.  Micro Data:  4/20 COVID-19 > Positive 4/20 Urine culture > 4/20 Blood culture > positive for Enterobacter and E.Coli susceptibilities  pending   4/20 Urine streptococcal > negative  4/20 Urine Legionella >  Antimicrobials:  4/20 empiric ceftriaxone  COVID Rx Decadron 4/20 x 10 days (post cortisol) Remdesivir 4/20 x 5 days Toci (not giving pending sepsis evaluation)  Interim history/subjective:  Lying in bed acutely confused, unable to follow any commands but currently sedated with Precedex drip.   Objective   Blood pressure 131/69, pulse 89, temperature 98.4 F (36.9 C), temperature source Axillary, resp. rate 15, height 5\' 4"  (1.626 m), weight 77.5 kg, last menstrual period 02/11/2008, SpO2 95 %.        Intake/Output Summary (Last 24 hours) at 06/29/2019 0823 Last data filed at 06/29/2019 0736 Gross per 24 hour  Intake 4654.17 ml  Output 318 ml  Net 4336.17 ml   Filed Weights   06/27/19 1352 06/28/19 0916  Weight: 75.3 kg 77.5 kg    Examination: General: Acutely ill appearing elderly female lying in bed in mild respiratory distress  HEENT: [place]/AT, MM pink/dry, PERRL, pupils 30mm bilaterally with sluggish response  Neuro: Will open eyes to verbal stimuli but unable to follow commands, CV: s1s2 regular rate and rhythm, no murmur, rubs, or gallops,  PULM:  Diminished air entry with crackles bilatreally, slight accessory muscle use seen, oxygen saturations 93-95 on 3L [place]  GI: soft, bowel sounds active in all 4 quadrants, non-tender, non-distended Extremities: warm/dry, no edema  Skin: no rashes or lesions  Resolved Hospital Problem list     Assessment & Plan:  Sepsis with E.Coli bacteremia  -Criteria: tachycardia, tachypnea, mild AMS, leukocytosis, and concern for acute infection   -Source is likely secondary to acute UTI, urine culture pending P: Continue supplemental oxygen Low threshold for intubation  Follow cultures  Continue IV Ceftriaxone  Continue pressors for MAP goal > 65 [place]cal 83.55 > 71.63 > <0.10  Mild acute hypoxemic respiratory failure in the setting of sepsis and COVID-19  -COVID  positive 4/20, no acute respiratory complaints currently  -CT chest with small pleural effusions with basilar atelectasis. P:   Continue supplemental oxygen for sat goal > 92 Encourage pulmonary hygiene  Follow cultures  Continue Decadron and Remdesivir  Low threshold for intubation  My need to trial volume removal with CRRT Obtain CXR   UTI -UA positive for nitrates and bacteria   Obstructing left kidney stone with hydroureteronephrosis  -Seen on CT scan 4/20 Acute kidney  -Baseline creatiine appears to be 0.7-0.8, creatinine currently 3.8 P:  Nephrology and urology following appreciate assistance  Renal function worsening will need CRRT today  Volume removal per nephrology  Nephrostomy tube placed 4/21 by IR  Trend Bmet  Avoid nephrotoxins  Continue IV antibiotics   History of seizures on topiramate Keppra and Felbatol  -Mentating normally at admission. 1-2 seizures at q2 months. Last seizure per BF - 3 weeks ago -Admitting provider had phone consult with Dr Rory Percy neuro with recommendations to continue Felbamate at half strength  P: Continue Keppra, Topamaex and Felbamate Seizure precautions  Circulatory shock in the setting of sepsis, improving  -S/P aggressive IV hydration with worsening renal function and oliguria  P:  Continue pressor support  Close monitoring in the ICU setting  A-line in place   Gallstones with mild biliary dilatation seen on admit ultrasound 06/27/2019 -patient denise  any acute abdominal pain  -CT abdomen with Cholelithiasis with hydropic gallbladder but no acute inflammation  -Lipase and amylase WNL -Surgery consulted on admission with no acute indications of surgical interventions P:   Will need follow up as an outpatient   At risk for DVT Elevated D-dimer in the setting of COVID  Thrombocytopenia new 06/27/2019 P: Heparin drip started 4/21 Close monitoring for bleeding  Trend CBC    Hypoglycemia  P: Continue D5 drip   Best  practice:  Diet: N.p.o. Pain/Anxiety/Delirium protocol (if indicated): PRNs VAP protocol (if indicated):N/A DVT prophylaxis: scd but start lovenox if platelet improves GI prophylaxis: Protonix Glucose control: SSI Mobility: Bedrest Code Status: Full code Family Communication: Updated over the phone  Disposition: Admit to ICU from the [facility] Performed by: Johnsie Cancel  Total critical care time: 40 minutes  Critical care time was exclusive of separately billable procedures and treating other patients.  Critical care was necessary to treat or prevent imminent or life-threatening deterioration.  Critical care was time spent personally by me on the following activities: development of treatment plan with patient and/or surrogate as well as nursing, discussions with consultants, evaluation of patient's response to treatment, examination of patient, obtaining history from patient or surrogate, ordering and performing treatments and interventions, ordering and review of laboratory studies, ordering and review of radiographic studies, pulse oximetry and re-evaluation of patient's condition.  Johnsie Cancel, NP-C [facility] Contact / Pager information can be found on Amion  06/29/2019, 8:23 AM  LABS    PULMONARY Recent Labs  Lab 06/27/19 1624 06/27/19 2338 06/28/19 0810 06/29/19 0530  PHART  --  7.393 7.319* 7.361  PCO2ART  --  18.5* 27.7* 32.5  PO2ART  --  130* 72.3* 78.0*  HCO3 13.7* 11.2* 13.8* 18.0*  O2SAT 52.3 98.2 93.8 95.1    CBC Recent Labs  Lab 06/27/19 1349 06/27/19 1349 06/27/19 1904 06/28/19 0549 06/29/19 0353  HGB 14.2  --   --  10.9* 10.4*  HCT 44.0  --   --  33.5* 30.0*  WBC 5.9  --   --  20.2* 33.5*  PLT 89*   < > 80* 67* 61*   < > = values in this interval not displayed.    COAGULATION Recent Labs  Lab 06/27/19 1349 06/27/19 1904 06/28/19 1314  INR 1.7* 1.6* 1.6*    CARDIAC  No  results for input(s): TROPONINI in the last 168 hours. No results for input(s): PROBNP in the last 168 hours.   CHEMISTRY Recent Labs  Lab 06/27/19 1349 06/27/19 1349 06/27/19 1904 06/27/19 1904 06/28/19 0549 06/28/19 0549 06/28/19 1617 06/29/19 0353  NA 141  --  142  --  141  --  139 138  K 3.1*   < > 3.0*   < > 3.8   < > 3.7 3.9  CL 112*  --  113*  --  110  --  111 102  CO2 14*  --  14*  --  14*  --  14* 17*  GLUCOSE 115*  --  93  --  97  --  95 131*  BUN 37*  --  38*  --  49*  --  51* 58*  CREATININE 3.13*  --  3.10*  --  3.80*  --  4.12* 4.69*  CALCIUM 8.9  --  7.8*  --  7.4*  --  6.6* 6.7*  MG  --   --   --   --  1.3*  --   --  1.3*  PHOS  --   --   --   --  4.0  --  4.6 4.6   < > = values in this interval not displayed.   Estimated Creatinine Clearance: 12.2 mL/min (A) (by C-G formula based on SCr of 4.69 mg/dL (H)).   LIVER Recent Labs  Lab 06/26/19 2114 06/26/19 2114 06/27/19 1349 06/27/19 1904 06/28/19 0549 06/28/19 1314 06/28/19 1617 06/29/19 0353  AST 29  --  43* 36 33  --   --  61*  ALT 23  --  36 27 26  --   --  37  ALKPHOS 132*  --  200* 116 92  --   --  123  BILITOT 0.6  --  2.1* 1.2 0.8  --   --  1.0  PROT 7.1  --  7.1 5.4* 5.5*  --   --  5.2*  ALBUMIN 3.8   < > 3.5 2.6* 2.6*  --  2.3* 2.3*  INR  --   --  1.7* 1.6*  --  1.6*  --   --    < > = values in this interval not displayed.     INFECTIOUS Recent Labs  Lab 06/27/19 1356 06/27/19 1620 06/27/19 1904 06/28/19 0549 06/29/19 0353  LATICACIDVEN 4.6* 5.0*  --  4.5*  --   PROCALCITON  --   --  83.55 71.36 <0.10     ENDOCRINE CBG (last 3)  Recent Labs    06/29/19 0013 06/29/19 0350 06/29/19 0736  GLUCAP 119* 99 91         IMAGING x48h  - image(s) personally visualized  -   highlighted in bold CT ABDOMEN PELVIS WO CONTRAST  Result Date: 06/27/2019 CLINICAL DATA:  Sepsis EXAM: CT CHEST, ABDOMEN AND PELVIS WITHOUT CONTRAST TECHNIQUE: Multidetector CT imaging of the chest,  abdomen and pelvis was performed following the standard protocol without IV contrast. COMPARISON:  None. FINDINGS: CT CHEST FINDINGS Cardiovascular: Heart size is normal. There are atherosclerotic calcifications of the thoracic aorta. Mediastinum/Nodes: Small hiatal hernia. No mediastinal or axillary lymphadenopathy. Lungs/Pleura: Small pleural effusions with basilar atelectasis. No other consolidation. There is an incidentally noted azygos fissure. Central airways are patent. Musculoskeletal: No chest wall mass or suspicious bone lesions identified. CT ABDOMEN PELVIS FINDINGS HEPATOBILIARY: Normal hepatic contours. No intra- or extrahepatic biliary dilatation. There is cholelithiasis with a hydropic gallbladder. No evidence of acute inflammation. PANCREAS: Normal pancreas. No ductal dilatation or peripancreatic fluid collection. SPLEEN: Normal. ADRENALS/URINARY TRACT: The adrenal glands are normal. There is a stone within the proximal left ureter measuring 10 mm, causing mild hydroureteronephrosis and mild perinephric stranding. Additionally, there are multiple bilateral nonobstructing renal calculi that measure up to 6 mm. The urinary bladder is normal for degree of distention STOMACH/BOWEL: There is a small hiatal hernia. Normal duodenal course and caliber. No small bowel dilatation or inflammation. No focal colonic abnormality. Normal appendix. VASCULAR/LYMPHATIC: There is calcific atherosclerosis of the abdominal aorta. No abdominal or pelvic lymphadenopathy. REPRODUCTIVE: Normal uterus and ovaries. MUSCULOSKELETAL. No bony spinal canal stenosis or focal osseous abnormality. OTHER: None. IMPRESSION: 1. Left-sided obstructive uropathy with 10 mm stone in the proximal left ureter causing mild hydroureteronephrosis and perinephric stranding. 2. Multiple bilateral nonobstructing renal calculi measuring up to 6 mm. 3. Small pleural effusions with basilar atelectasis. 4. Cholelithiasis with hydropic gallbladder. No  evidence of acute inflammation. 5. Aortic Atherosclerosis (ICD10-I70.0). Electronically Signed   By: Cletus Gash.D.  On: 06/27/2019 22:09   DG Chest 1 View  Result Date: 06/28/2019 CLINICAL DATA:  COVID positive, central line placement. EXAM: CHEST  1 VIEW COMPARISON:  Chest radiograph and CT chest 06/27/2019. FINDINGS: Right IJ central line tip is in the SVC. No pneumothorax. Patient is slightly rotated. Trachea is midline. Heart size normal. Next articular airspace opacification with small bilateral pleural effusions. IMPRESSION: 1. Right IJ central line placement without complicating feature. 2. Pulmonary edema and small bilateral pleural effusions. Electronically Signed   By: Lorin Picket M.D.   On: 06/28/2019 11:28   CT CHEST WO CONTRAST  Result Date: 06/27/2019 CLINICAL DATA:  Sepsis EXAM: CT CHEST, ABDOMEN AND PELVIS WITHOUT CONTRAST TECHNIQUE: Multidetector CT imaging of the chest, abdomen and pelvis was performed following the standard protocol without IV contrast. COMPARISON:  None. FINDINGS: CT CHEST FINDINGS Cardiovascular: Heart size is normal. There are atherosclerotic calcifications of the thoracic aorta. Mediastinum/Nodes: Small hiatal hernia. No mediastinal or axillary lymphadenopathy. Lungs/Pleura: Small pleural effusions with basilar atelectasis. No other consolidation. There is an incidentally noted azygos fissure. Central airways are patent. Musculoskeletal: No chest wall mass or suspicious bone lesions identified. CT ABDOMEN PELVIS FINDINGS HEPATOBILIARY: Normal hepatic contours. No intra- or extrahepatic biliary dilatation. There is cholelithiasis with a hydropic gallbladder. No evidence of acute inflammation. PANCREAS: Normal pancreas. No ductal dilatation or peripancreatic fluid collection. SPLEEN: Normal. ADRENALS/URINARY TRACT: The adrenal glands are normal. There is a stone within the proximal left ureter measuring 10 mm, causing mild hydroureteronephrosis and mild  perinephric stranding. Additionally, there are multiple bilateral nonobstructing renal calculi that measure up to 6 mm. The urinary bladder is normal for degree of distention STOMACH/BOWEL: There is a small hiatal hernia. Normal duodenal course and caliber. No small bowel dilatation or inflammation. No focal colonic abnormality. Normal appendix. VASCULAR/LYMPHATIC: There is calcific atherosclerosis of the abdominal aorta. No abdominal or pelvic lymphadenopathy. REPRODUCTIVE: Normal uterus and ovaries. MUSCULOSKELETAL. No bony spinal canal stenosis or focal osseous abnormality. OTHER: None. IMPRESSION: 1. Left-sided obstructive uropathy with 10 mm stone in the proximal left ureter causing mild hydroureteronephrosis and perinephric stranding. 2. Multiple bilateral nonobstructing renal calculi measuring up to 6 mm. 3. Small pleural effusions with basilar atelectasis. 4. Cholelithiasis with hydropic gallbladder. No evidence of acute inflammation. 5. Aortic Atherosclerosis (ICD10-I70.0). Electronically Signed   By: Ulyses Jarred M.D.   On: 06/27/2019 22:09   US Aorta  Result Date: 06/27/2019 CLINICAL DATA:  Back pain, weakness, evaluate for abdominal aortic aneurysm EXAM: ULTRASOUND OF ABDOMINAL AORTA TECHNIQUE: Ultrasound examination of the abdominal aorta and proximal common iliac arteries was performed to evaluate for aneurysm. Additional color and Doppler images of the distal aorta were obtained to document patency. COMPARISON:  None. FINDINGS: Abdominal aortic measurements as follows: Proximal:  2.2 cm Mid:  1.8 cm Distal:  1.7 cm Patent: Yes, peak systolic velocity is 61 cm/s Right common iliac artery: Not visualized cm Left common iliac artery: Not visualized cm IMPRESSION: 1.  No abdominal aortic aneurysm identified. 2. Absence of aortic aneurysm by ultrasound does not exclude aortic dissection, intramural hematoma, or other acute aortic pathology. Consider CT angiogram to further evaluate if there is high  clinical concern for aortic pathology in the setting of acute pain. Electronically Signed   By: Eddie Candle M.D.   On: 06/27/2019 17:03   DG Chest Port 1 View  Result Date: 06/27/2019 CLINICAL DATA:  LEFT-sided back pain EXAM: PORTABLE CHEST 1 VIEW COMPARISON:  None. FINDINGS: Normal cardiac silhouette. Mild venous  congestion. Prominent azygos fissure. No pneumothorax. No pulmonary edema. No infiltrate. No acute osseous abnormality. IMPRESSION: Central venous congestion. Electronically Signed   By: Suzy Bouchard M.D.   On: 06/27/2019 15:02   IR NEPHROSTOMY PLACEMENT LEFT  Result Date: 06/28/2019 INDICATION: 64 year old female with urosepsis secondary to proximal ureteral stone and obstructed pyonephrosis on the left. She is critically ill and too unstable for general anesthesia and retrograde ureteral stent placement. Therefore, she presents to interventional radiology for percutaneous nephrostomy tube placement. EXAM: IR NEPHROSTOMY PLACEMENT LEFT COMPARISON:  CT abdomen/pelvis 06/27/2019 MEDICATIONS: Ancef 2 gm IV; The antibiotic was administered in an appropriate time frame prior to skin puncture. ANESTHESIA/SEDATION: Fentanyl 50 mcg IV; Versed 1 mg IV Moderate Sedation Time:  14 minutes The patient was continuously monitored during the procedure by the interventional radiology nurse under my direct supervision. CONTRAST:  72mL OMNIPAQUE IOHEXOL 300 MG/ML SOLN - administered into the collecting system(s) FLUOROSCOPY TIME:  Fluoroscopy Time: 2 minutes 36 seconds (36 mGy). COMPLICATIONS: None immediate. TECHNIQUE: The procedure, risks, benefits, and alternatives were explained to the patient. Questions regarding the procedure were encouraged and answered. The patient understands and consents to the procedure. The left flank was prepped with chlorhexidine in a sterile fashion, and a sterile drape was applied covering the operative field. A sterile gown and sterile gloves were used for the procedure.  Local anesthesia was provided with 1% Lidocaine. The left flank was interrogated with ultrasound and the left kidney identified. The kidney is hydronephrotic. A suitable access site on the skin overlying the lower pole, posterior calix was identified. After local mg anesthesia was achieved, a small skin nick was made with an 11 blade scalpel. A 21 gauge Accustick needle was then advanced under direct sonographic guidance into the lower pole of the kidney. A 0.018 inch wire was advanced under fluoroscopic guidance into the left renal collecting system. The Accustick sheath was then advanced over the wire and a 0.018 system exchanged for a 0.035 system. Gentle hand injection of contrast material confirms placement of the sheath within the renal collecting system. There is mild hydronephrosis. The tract from the scan into the renal collecting system was then dilated serially to 10-French. A 10-French Cook all-purpose drain was then placed and positioned under fluoroscopic guidance. The locking loop is well formed within the left renal pelvis. The catheter was secured to the skin with 2-0 Prolene and a sterile bandage was placed. Catheter was left to gravity bag drainage. IMPRESSION: Successful placement of a left 10 French percutaneous nephrostomy tube. Electronically Signed   By: Jacqulynn Cadet M.D.   On: 06/28/2019 16:06   US Abdomen Limited RUQ  Result Date: 06/27/2019 CLINICAL DATA:  Right upper quadrant pain. EXAM: ULTRASOUND ABDOMEN LIMITED RIGHT UPPER QUADRANT COMPARISON:  None. FINDINGS: Gallbladder: Multiple gallstones evident, measuring up to 3.3 cm diameter. Gallbladder wall thickness upper normal at 3 mm. No pericholecystic fluid. Sonographer reports no sonographic Murphy sign. Common bile duct: Diameter: 8 mm common duct diameter in the hepatoduodenal ligament. Liver: No focal abnormality evident with subtle increase in parenchymal echotexture suggesting component of fatty deposition. Portal vein is  patent on color Doppler imaging with normal direction of blood flow towards the liver. Other: None. IMPRESSION: Cholelithiasis with mild extrahepatic biliary duct distension. Correlation with liver function test recommended. Gallbladder wall thickness upper normal without pericholecystic fluid or sonographic Murphy sign. Electronically Signed   By: Misty Stanley M.D.   On: 06/27/2019 17:07

## 2019-06-29 NOTE — Progress Notes (Signed)
eLink Physician-Brief Progress Note Patient Name: Amber Bowman DOB: 06/09/1955 MRN: [id number]   Date of Service  06/29/2019  HPI/Events of Note  Pt more restless over the course of the night, saturation has drifted down from about 97 % to 92 %.  eICU Interventions  Will check ABG, Precedex 0.2 - 0.6 mcg infusion for anxiolysis and comfort without respiratory depression.        [name] Ogan 06/29/2019, 5:08 AM

## 2019-06-29 NOTE — Progress Notes (Signed)
Patient ID: Amber Bowman, female   DOB: 1955-06-05, 64 y.o.   MRN: [id number]    Subjective: S/P left nephrostomy tube placement yesterday.  Scant UOP overnight overall.  Objective: Vital signs in last 24 hours: Temp:  [97.5 F (36.4 C)-97.7 F (36.5 C)] 97.7 F (36.5 C) (04/21 2100) Pulse Rate:  [80-119] 80 (04/22 0600) Resp:  [14-30] 15 (04/22 0600) BP: (71-157)/(40-79) 131/69 (04/22 0600) SpO2:  [58 %-99 %] 93 % (04/22 0630) Arterial Line BP: (69-147)/(42-110) 139/73 (04/22 0630) Weight:  [77.5 kg] 77.5 kg (04/21 0916)  Intake/Output from previous day: 04/21 0701 - 04/22 0700 In: 4442.3 [I.V.:3967.2; IV Piggyback:475] Out: 268 [Urine:18; Emesis/NG output:250] Intake/Output this shift: No intake/output data recorded.   Lab Results: Recent Labs    06/27/19 1349 06/28/19 0549 06/29/19 0353  HGB 14.2 10.9* 10.4*  HCT 44.0 33.5* 30.0*   CBC Latest Ref Rng & Units 06/29/2019 06/28/2019 06/27/2019  WBC 4.0 - 10.5 K/uL 33.5(H) 20.2(H) -  Hemoglobin 12.0 - 15.0 g/dL 10.4(L) 10.9(L) -  Hematocrit 36.0 - 46.0 % 30.0(L) 33.5(L) -  Platelets 150 - 400 K/uL 61(L) 67(L) 80(L)     BMET Recent Labs    06/28/19 1617 06/29/19 0353  NA 139 138  K 3.7 3.9  CL 111 102  CO2 14* 17*  GLUCOSE 95 131*  BUN 51* 58*  CREATININE 4.12* 4.69*  CALCIUM 6.6* 6.7*     Studies/Results: GNRs in blood culture  Assessment/Plan: 1) Left ureteral stone with sepsis: S/P left nephrostomy tube placement yesterday.  Continue broad spectrum antibiotics pending final culture results.  Culture sent from nephrostomy last night.  Renal function worsening and mostly anuric likely related to pre-renal etiology due to sepsis.  Definitive stone management deferred until full recovery from and treatment of acute infection.   LOS: 2 days   Amber Bowman 06/29/2019, 7:06 AM

## 2019-06-29 NOTE — Progress Notes (Signed)
[facility] Progress Note  Subjective: had L perc neph yesterday. Very minimal UOP from foley and L perc neph.   Vitals:   06/29/19 0630 06/29/19 0700 06/29/19 0715 06/29/19 0745  BP:      Pulse:  83 78   Resp:  19 14   Temp:    98.4 F (36.9 C)  TempSrc:    Axillary  SpO2: 93% 92% 95%   Weight:      Height:        Exam: Seen thru window , not in resp distress    Home meds:   Neuro - keppra 500 bid, topamax 200 bid, felbamate 1200 qid   Other - ASA 81 qd    Prn's/ vitamins/ supplements     UA - > 50 rbc, 11-20 wbc, few bact, neg protein   Assessment/ Plan: 1. Renal failure - acute / AKI due to septic shock/ hypoperfusion/ ATN.  SP L perc neph yest by IR. Not doing much better today. Minimal UOP. Lethargic, was agitated yest pulling at lines. Recommend starting CRRT this am. No heparin w/ CRRT, is on IV systemic heparin and has low plts.   2. UTI w/ L ureteral obstruction - sp L perc neph 4/21 3. Seizure d/o - on several meds at home 4. Hypotension/ shock - on pressor support 5. Volume - sig vol overload, 3rd spacing due to shock. Repeat CXR this am.  6. +COVID-19 - asymptomatic     Rob Oneika Simonian 06/29/2019, 7:51 AM   Recent Labs  Lab 06/28/19 0549 06/28/19 0549 06/28/19 1617 06/29/19 0353  K 3.8   < > 3.7 3.9  BUN 49*   < > 51* 58*  CREATININE 3.80*   < > 4.12* 4.69*  CALCIUM 7.4*   < > 6.6* 6.7*  PHOS 4.0   < > 4.6 4.6  HGB 10.9*  --   --  10.4*   < > = values in this interval not displayed.   Inpatient medications: . aspirin EC  81 mg Oral Daily  . Chlorhexidine Gluconate Cloth  6 each Topical Daily  . dexamethasone (DECADRON) injection  6 mg Intravenous Q24H  . dextrose      . felbamate  300 mg Oral Q12H  . insulin aspart  0-9 Units Subcutaneous Q4H  . levETIRAcetam  500 mg Oral BID  . mouth rinse  15 mL Mouth Rinse BID  . mouth rinse  15 mL Mouth Rinse BID  . multivitamin with minerals  1 tablet Oral Daily  . mupirocin  ointment  1 application Nasal BID  . pantoprazole (PROTONIX) IV  40 mg Intravenous QHS  . sodium chloride flush  10-40 mL Intracatheter Q12H  . topiramate  200 mg Oral BID   . sodium chloride    . sodium chloride 20 mL/hr at 06/29/19 0736  . cefTRIAXone (ROCEPHIN)  IV Stopped (06/28/19 2353)  . dexmedetomidine (PRECEDEX) IV infusion 0.6 mcg/kg/hr (06/29/19 0736)  . dextrose 50 mL/hr at 06/29/19 0736  . heparin 1,000 Units/hr (06/29/19 0736)  . magnesium sulfate bolus IVPB 2 g (06/29/19 0739)   Followed by  . magnesium sulfate bolus IVPB    . norepinephrine (LEVOPHED) Adult infusion 24 mcg/min (06/29/19 0359)  . remdesivir 100 mg in NS 100 mL Stopped (06/28/19 1054)  .  sodium bicarbonate (isotonic) infusion in sterile water 100 mL/hr at 06/29/19 0736  . vasopressin (PITRESSIN) infusion - *FOR SHOCK* 0.03 Units/min (06/29/19 0736)   Place/Maintain arterial line **AND** sodium chloride,  heparin, ondansetron (ZOFRAN) IV, promethazine, sodium chloride flush

## 2019-06-29 NOTE — Progress Notes (Signed)
ANTICOAGULATION CONSULT NOTE  Pharmacy Consult for heparin Indication: Elevated D-dimer with COVID  No Known Allergies  Patient Measurements: Height: 5\' 4"  (162.6 cm) Weight: 77.5 kg (170 lb 13.7 oz) IBW/kg (Calculated) : 54.7 Heparin Dosing Weight: 70.5 kg  Vital Signs: Temp: 98.4 F (36.9 C) (04/22 0745) Temp Source: Axillary (04/22 0745) BP: 137/77 (04/22 1200) Pulse Rate: 96 (04/22 1200)  Labs: Recent Labs    06/26/19 2114 06/26/19 2114 06/27/19 1349 06/27/19 1349 06/27/19 1904 06/28/19 0549 06/28/19 1314 06/28/19 1617 06/29/19 0250 06/29/19 0353 06/29/19 1130  HGB 13.6   < > 14.2   < >  --  10.9*  --   --   --  10.4*  --   HCT 42.3   < > 44.0  --   --  33.5*  --   --   --  30.0*  --   PLT 280   < > 89*   < > 80* 67*  --   --   --  61*  --   APTT  --   --  60*  --  51*  --   --   --   --   --   --   LABPROT  --   --  19.6*  --  19.1*  --  18.8*  --   --   --   --   INR  --   --  1.7*  --  1.6*  --  1.6*  --   --   --   --   HEPARINUNFRC  --   --   --   --   --   --   --   --  0.22*  --  0.22*  CREATININE 1.44*   < > 3.13*   < > 3.10* 3.80*  --  4.12*  --  4.69*  --   TROPONINIHS 5  --   --   --   --   --   --   --   --   --   --    < > = values in this interval not displayed.    Estimated Creatinine Clearance: 12.2 mL/min (A) (by C-G formula based on SCr of 4.69 mg/dL (H)).   Medical History: Past Medical History:  Diagnosis Date  . Colon polyps 2014  . Condyloma   . Depression   . Epilepsy ([place])   . Osteopenia 04/2018   T score -1.8 FRAX 9.5% / 1.1% overall stable from prior DEXA  . Seizures ([place])     Medications:  Medications Prior to Admission  Medication Sig Dispense Refill Last Dose  . aspirin 81 MG tablet Take 81 mg by mouth daily.       . betamethasone valerate ointment (VALISONE) 0.1 % Apply 1 application topically 2 (two) times daily. 30 g 1   . CRANBERRY PO Take 2 tablets by mouth daily.      . felbamate (FELBATOL) 600 MG tablet TAKE 2  TABLETS BY MOUTH 4 TIMES A DAY (Patient taking differently: Take 1,200 mg by mouth 4 (four) times daily. ) 240 tablet 6   . levETIRAcetam (KEPPRA) 500 MG tablet TAKE 1 TABLET BY MOUTH TWICE A DAY (Patient taking differently: Take 500 mg by mouth 2 (two) times daily. ) 180 tablet 2   . metroNIDAZOLE (METROGEL) 0.75 % vaginal gel Place 1 Applicatorful vaginally 2 (two) times daily. 70 g 0   . Multiple Vitamins-Minerals (ICAPS AREDS FORMULA PO) Take by  mouth.     . topiramate (TOPAMAX) 200 MG tablet TAKE 1 TABLET BY MOUTH TWICE A DAY (Patient taking differently: Take 200 mg by mouth 2 (two) times daily. ) 180 tablet 3     Assessment: Pharmacy consulted for heparin drip for "Elevated D-dimer with COVID, would like heparin drip without a bolus please".   Pt currently on SCDs for VTE px due to new onset thrombocytopenia 4/20.   Baseline labs: SCr 4.12, INR elevated at 1.6, Hg 13.6> 14.2> 10.9, PLTC 280> 89>80>67 D dimer > 20 4/21: s/p percutaneous placement of nephrostomy tube for obstructing renal calculi without any acute complications & 0000000 units heparin given today at 1151 w/ HD catheter placement  06/29/2019: - Heparin level remains subtherapeutic (0.22) on heparin infusion at 1000 units/hr - CBC: Hg low bust stable at 10.4, pltc low but stable at 61 - No bleeding or infusion related issues noted by nursing   Goal of Therapy:  Heparin level 0.3-0.7 units/ml Monitor platelets by anticoagulation protocol: Yes   Plan:  Increase heparin drip to 1150 units/hr Check 8 hr heparin level after rate increase Daily heparin level and CBC while on heparin Monitor closely for s/sx bleeding  Netta Cedars, PharmD, BCPS 06/29/2019 12:13 PM

## 2019-06-29 NOTE — Progress Notes (Signed)
Referring Physician(s): Raynelle Bring  Supervising Physician: Arne Cleveland  Patient Status:  [facility] - In-pt  Chief Complaint: None- lethargic  Subjective:  History of urosepsis secondary to left obstructed pyonephrosis due to proximal left ureteral stone s.p left percutaneous nephrostomy tube placement in IR 06/28/2019 by Dr. Laurence Ferrari. Patient laying in bed resting comfortably. Appears lethargic- responds to voice but eyes remain closed. On CRRT. Left PCN site c/d/i.   Allergies: Patient has no known allergies.  Medications: Prior to Admission medications   Medication Sig Start Date End Date Taking? Authorizing Provider  aspirin 81 MG tablet Take 81 mg by mouth daily.      [provider]  betamethasone valerate ointment (VALISONE) 0.1 % Apply 1 application topically 2 (two) times daily. 05/26/19   Princess Bruins, MD  CRANBERRY PO Take 2 tablets by mouth daily.     [provider]  felbamate (FELBATOL) 600 MG tablet TAKE 2 TABLETS BY MOUTH 4 TIMES A DAY Patient taking differently: Take 1,200 mg by mouth 4 (four) times daily.  06/07/19   Ward Givens, NP  levETIRAcetam (KEPPRA) 500 MG tablet TAKE 1 TABLET BY MOUTH TWICE A DAY Patient taking differently: Take 500 mg by mouth 2 (two) times daily.  03/27/19   Ward Givens, NP  metroNIDAZOLE (METROGEL) 0.75 % vaginal gel Place 1 Applicatorful vaginally 2 (two) times daily. 05/26/19   Princess Bruins, MD  Multiple Vitamins-Minerals (ICAPS AREDS FORMULA PO) Take by mouth.    [provider]  topiramate (TOPAMAX) 200 MG tablet TAKE 1 TABLET BY MOUTH TWICE A DAY Patient taking differently: Take 200 mg by mouth 2 (two) times daily.  04/14/19   Ward Givens, NP     Vital Signs: BP 126/68   Pulse 69   Temp 98.4 F (36.9 C) (Axillary)   Resp 17   Ht 5\' 4"  (1.626 m)   Wt 170 lb 13.7 oz (77.5 kg)   LMP 02/11/2008   SpO2 (!) 89%   BMI 29.33 kg/m   Physical Exam Vitals and nursing note  reviewed.  Constitutional:      General: She is not in acute distress.    Appearance: She is toxic-appearing.  Pulmonary:     Effort: Pulmonary effort is normal. No respiratory distress.  Genitourinary:    Comments: Left PCN site without erythema, drainage, or active bleeding; approximately 75 cc blood tinged urine in gravity bag. Skin:    General: Skin is warm and dry.     Imaging: CT ABDOMEN PELVIS WO CONTRAST  Result Date: 06/27/2019 CLINICAL DATA:  Sepsis EXAM: CT CHEST, ABDOMEN AND PELVIS WITHOUT CONTRAST TECHNIQUE: Multidetector CT imaging of the chest, abdomen and pelvis was performed following the standard protocol without IV contrast. COMPARISON:  None. FINDINGS: CT CHEST FINDINGS Cardiovascular: Heart size is normal. There are atherosclerotic calcifications of the thoracic aorta. Mediastinum/Nodes: Small hiatal hernia. No mediastinal or axillary lymphadenopathy. Lungs/Pleura: Small pleural effusions with basilar atelectasis. No other consolidation. There is an incidentally noted azygos fissure. Central airways are patent. Musculoskeletal: No chest wall mass or suspicious bone lesions identified. CT ABDOMEN PELVIS FINDINGS HEPATOBILIARY: Normal hepatic contours. No intra- or extrahepatic biliary dilatation. There is cholelithiasis with a hydropic gallbladder. No evidence of acute inflammation. PANCREAS: Normal pancreas. No ductal dilatation or peripancreatic fluid collection. SPLEEN: Normal. ADRENALS/URINARY TRACT: The adrenal glands are normal. There is a stone within the proximal left ureter measuring 10 mm, causing mild hydroureteronephrosis and mild perinephric stranding. Additionally, there are multiple bilateral nonobstructing renal  calculi that measure up to 6 mm. The urinary bladder is normal for degree of distention STOMACH/BOWEL: There is a small hiatal hernia. Normal duodenal course and caliber. No small bowel dilatation or inflammation. No focal colonic abnormality. Normal  appendix. VASCULAR/LYMPHATIC: There is calcific atherosclerosis of the abdominal aorta. No abdominal or pelvic lymphadenopathy. REPRODUCTIVE: Normal uterus and ovaries. MUSCULOSKELETAL. No bony spinal canal stenosis or focal osseous abnormality. OTHER: None. IMPRESSION: 1. Left-sided obstructive uropathy with 10 mm stone in the proximal left ureter causing mild hydroureteronephrosis and perinephric stranding. 2. Multiple bilateral nonobstructing renal calculi measuring up to 6 mm. 3. Small pleural effusions with basilar atelectasis. 4. Cholelithiasis with hydropic gallbladder. No evidence of acute inflammation. 5. Aortic Atherosclerosis (ICD10-I70.0). Electronically Signed   By: Ulyses Jarred M.D.   On: 06/27/2019 22:09   DG Chest 1 View  Result Date: 06/28/2019 CLINICAL DATA:  COVID positive, central line placement. EXAM: CHEST  1 VIEW COMPARISON:  Chest radiograph and CT chest 06/27/2019. FINDINGS: Right IJ central line tip is in the SVC. No pneumothorax. Patient is slightly rotated. Trachea is midline. Heart size normal. Next articular airspace opacification with small bilateral pleural effusions. IMPRESSION: 1. Right IJ central line placement without complicating feature. 2. Pulmonary edema and small bilateral pleural effusions. Electronically Signed   By: Lorin Picket M.D.   On: 06/28/2019 11:28   CT CHEST WO CONTRAST  Result Date: 06/27/2019 CLINICAL DATA:  Sepsis EXAM: CT CHEST, ABDOMEN AND PELVIS WITHOUT CONTRAST TECHNIQUE: Multidetector CT imaging of the chest, abdomen and pelvis was performed following the standard protocol without IV contrast. COMPARISON:  None. FINDINGS: CT CHEST FINDINGS Cardiovascular: Heart size is normal. There are atherosclerotic calcifications of the thoracic aorta. Mediastinum/Nodes: Small hiatal hernia. No mediastinal or axillary lymphadenopathy. Lungs/Pleura: Small pleural effusions with basilar atelectasis. No other consolidation. There is an incidentally noted  azygos fissure. Central airways are patent. Musculoskeletal: No chest wall mass or suspicious bone lesions identified. CT ABDOMEN PELVIS FINDINGS HEPATOBILIARY: Normal hepatic contours. No intra- or extrahepatic biliary dilatation. There is cholelithiasis with a hydropic gallbladder. No evidence of acute inflammation. PANCREAS: Normal pancreas. No ductal dilatation or peripancreatic fluid collection. SPLEEN: Normal. ADRENALS/URINARY TRACT: The adrenal glands are normal. There is a stone within the proximal left ureter measuring 10 mm, causing mild hydroureteronephrosis and mild perinephric stranding. Additionally, there are multiple bilateral nonobstructing renal calculi that measure up to 6 mm. The urinary bladder is normal for degree of distention STOMACH/BOWEL: There is a small hiatal hernia. Normal duodenal course and caliber. No small bowel dilatation or inflammation. No focal colonic abnormality. Normal appendix. VASCULAR/LYMPHATIC: There is calcific atherosclerosis of the abdominal aorta. No abdominal or pelvic lymphadenopathy. REPRODUCTIVE: Normal uterus and ovaries. MUSCULOSKELETAL. No bony spinal canal stenosis or focal osseous abnormality. OTHER: None. IMPRESSION: 1. Left-sided obstructive uropathy with 10 mm stone in the proximal left ureter causing mild hydroureteronephrosis and perinephric stranding. 2. Multiple bilateral nonobstructing renal calculi measuring up to 6 mm. 3. Small pleural effusions with basilar atelectasis. 4. Cholelithiasis with hydropic gallbladder. No evidence of acute inflammation. 5. Aortic Atherosclerosis (ICD10-I70.0). Electronically Signed   By: Ulyses Jarred M.D.   On: 06/27/2019 22:09   US Aorta  Result Date: 06/27/2019 CLINICAL DATA:  Back pain, weakness, evaluate for abdominal aortic aneurysm EXAM: ULTRASOUND OF ABDOMINAL AORTA TECHNIQUE: Ultrasound examination of the abdominal aorta and proximal common iliac arteries was performed to evaluate for aneurysm. Additional  color and Doppler images of the distal aorta were obtained to document patency. COMPARISON:  None. FINDINGS: Abdominal aortic measurements as follows: Proximal:  2.2 cm Mid:  1.8 cm Distal:  1.7 cm Patent: Yes, peak systolic velocity is 61 cm/s Right common iliac artery: Not visualized cm Left common iliac artery: Not visualized cm IMPRESSION: 1.  No abdominal aortic aneurysm identified. 2. Absence of aortic aneurysm by ultrasound does not exclude aortic dissection, intramural hematoma, or other acute aortic pathology. Consider CT angiogram to further evaluate if there is high clinical concern for aortic pathology in the setting of acute pain. Electronically Signed   By: Eddie Candle M.D.   On: 06/27/2019 17:03   DG Chest Port 1 View  Result Date: 06/27/2019 CLINICAL DATA:  LEFT-sided back pain EXAM: PORTABLE CHEST 1 VIEW COMPARISON:  None. FINDINGS: Normal cardiac silhouette. Mild venous congestion. Prominent azygos fissure. No pneumothorax. No pulmonary edema. No infiltrate. No acute osseous abnormality. IMPRESSION: Central venous congestion. Electronically Signed   By: Suzy Bouchard M.D.   On: 06/27/2019 15:02   IR NEPHROSTOMY PLACEMENT LEFT  Result Date: 06/28/2019 INDICATION: 64 year old female with urosepsis secondary to proximal ureteral stone and obstructed pyonephrosis on the left. She is critically ill and too unstable for general anesthesia and retrograde ureteral stent placement. Therefore, she presents to interventional radiology for percutaneous nephrostomy tube placement. EXAM: IR NEPHROSTOMY PLACEMENT LEFT COMPARISON:  CT abdomen/pelvis 06/27/2019 MEDICATIONS: Ancef 2 gm IV; The antibiotic was administered in an appropriate time frame prior to skin puncture. ANESTHESIA/SEDATION: Fentanyl 50 mcg IV; Versed 1 mg IV Moderate Sedation Time:  14 minutes The patient was continuously monitored during the procedure by the interventional radiology nurse under my direct supervision. CONTRAST:  23mL  OMNIPAQUE IOHEXOL 300 MG/ML SOLN - administered into the collecting system(s) FLUOROSCOPY TIME:  Fluoroscopy Time: 2 minutes 36 seconds (36 mGy). COMPLICATIONS: None immediate. TECHNIQUE: The procedure, risks, benefits, and alternatives were explained to the patient. Questions regarding the procedure were encouraged and answered. The patient understands and consents to the procedure. The left flank was prepped with chlorhexidine in a sterile fashion, and a sterile drape was applied covering the operative field. A sterile gown and sterile gloves were used for the procedure. Local anesthesia was provided with 1% Lidocaine. The left flank was interrogated with ultrasound and the left kidney identified. The kidney is hydronephrotic. A suitable access site on the skin overlying the lower pole, posterior calix was identified. After local mg anesthesia was achieved, a small skin nick was made with an 11 blade scalpel. A 21 gauge Accustick needle was then advanced under direct sonographic guidance into the lower pole of the kidney. A 0.018 inch wire was advanced under fluoroscopic guidance into the left renal collecting system. The Accustick sheath was then advanced over the wire and a 0.018 system exchanged for a 0.035 system. Gentle hand injection of contrast material confirms placement of the sheath within the renal collecting system. There is mild hydronephrosis. The tract from the scan into the renal collecting system was then dilated serially to 10-French. A 10-French Cook all-purpose drain was then placed and positioned under fluoroscopic guidance. The locking loop is well formed within the left renal pelvis. The catheter was secured to the skin with 2-0 Prolene and a sterile bandage was placed. Catheter was left to gravity bag drainage. IMPRESSION: Successful placement of a left 10 French percutaneous nephrostomy tube. Electronically Signed   By: Jacqulynn Cadet M.D.   On: 06/28/2019 16:06   US Abdomen Limited  RUQ  Result Date: 06/27/2019 CLINICAL DATA:  Right upper quadrant  pain. EXAM: ULTRASOUND ABDOMEN LIMITED RIGHT UPPER QUADRANT COMPARISON:  None. FINDINGS: Gallbladder: Multiple gallstones evident, measuring up to 3.3 cm diameter. Gallbladder wall thickness upper normal at 3 mm. No pericholecystic fluid. Sonographer reports no sonographic Murphy sign. Common bile duct: Diameter: 8 mm common duct diameter in the hepatoduodenal ligament. Liver: No focal abnormality evident with subtle increase in parenchymal echotexture suggesting component of fatty deposition. Portal vein is patent on color Doppler imaging with normal direction of blood flow towards the liver. Other: None. IMPRESSION: Cholelithiasis with mild extrahepatic biliary duct distension. Correlation with liver function test recommended. Gallbladder wall thickness upper normal without pericholecystic fluid or sonographic Murphy sign. Electronically Signed   By: Misty Stanley M.D.   On: 06/27/2019 17:07    Labs:  CBC: Recent Labs    06/26/19 2114 06/26/19 2114 06/27/19 1349 06/27/19 1904 06/28/19 0549 06/29/19 0353  WBC 4.2  --  5.9  --  20.2* 33.5*  HGB 13.6  --  14.2  --  10.9* 10.4*  HCT 42.3  --  44.0  --  33.5* 30.0*  PLT 280   < > 89* 80* 67* 61*   < > = values in this interval not displayed.    COAGS: Recent Labs    06/27/19 1349 06/27/19 1904 06/28/19 1314  INR 1.7* 1.6* 1.6*  APTT 60* 51*  --     BMP: Recent Labs    06/27/19 1904 06/28/19 0549 06/28/19 1617 06/29/19 0353  NA 142 141 139 138  K 3.0* 3.8 3.7 3.9  CL 113* 110 111 102  CO2 14* 14* 14* 17*  GLUCOSE 93 97 95 131*  BUN 38* 49* 51* 58*  CALCIUM 7.8* 7.4* 6.6* 6.7*  CREATININE 3.10* 3.80* 4.12* 4.69*  GFRNONAA 15* 12* 11* 9*  GFRAA 18* 14* 12* 11*    LIVER FUNCTION TESTS: Recent Labs    06/27/19 1349 06/27/19 1349 06/27/19 1904 06/28/19 0549 06/28/19 1617 06/29/19 0353  BILITOT 2.1*  --  1.2 0.8  --  1.0  AST 43*  --  36 33  --  61*    ALT 36  --  27 26  --  37  ALKPHOS 200*  --  116 92  --  123  PROT 7.1  --  5.4* 5.5*  --  5.2*  ALBUMIN 3.5   < > 2.6* 2.6* 2.3* 2.3*   < > = values in this interval not displayed.    Assessment and Plan:  History of urosepsis secondary to left obstructed pyonephrosis due to proximal left ureteral stone s.p left percutaneous nephrostomy tube placement in IR 06/28/2019 by Dr. Laurence Ferrari. Left PCN stable with approximately 75 cc blood tinged urine in gravity bag (additional 50 cc output from drain in past 24 hours per chart). Continue current drain management while in house, tube to be managed by urology upon discharge. Further plans per CCM/urology/nephrology- appreciate and agree with management. IR to follow.   Electronically Signed: Earley Abide, PA-C 06/29/2019, 11:55 AM   I spent a total of 25 Minutes at the the patient's bedside AND on the patient's hospital floor or unit, greater than 50% of which was counseling/coordinating care for obstructed pyonephrosis s/p left PCN placement.

## 2019-06-29 NOTE — Progress Notes (Signed)
eLink Physician-Brief Progress Note Patient Name: Amber Bowman DOB: April 15, 1955 MRN: [id number]   Date of Service  06/29/2019  HPI/Events of Note  Frequent watery stools.  eICU Interventions  Flexiseal ordered.        [name] Catharina Pica 06/29/2019, 1:46 AM

## 2019-06-30 ENCOUNTER — Inpatient Hospital Stay (HOSPITAL_COMMUNITY): Payer: Medicare Other

## 2019-06-30 DIAGNOSIS — U071 COVID-19: Secondary | ICD-10-CM

## 2019-06-30 DIAGNOSIS — I340 Nonrheumatic mitral (valve) insufficiency: Secondary | ICD-10-CM

## 2019-06-30 DIAGNOSIS — I361 Nonrheumatic tricuspid (valve) insufficiency: Secondary | ICD-10-CM | POA: Diagnosis not present

## 2019-06-30 DIAGNOSIS — J9601 Acute respiratory failure with hypoxia: Secondary | ICD-10-CM

## 2019-06-30 DIAGNOSIS — N179 Acute kidney failure, unspecified: Secondary | ICD-10-CM | POA: Diagnosis not present

## 2019-06-30 LAB — CBC WITH DIFFERENTIAL/PLATELET
Abs Immature Granulocytes: 3.04 10*3/uL — ABNORMAL HIGH (ref 0.00–0.07)
Basophils Absolute: 0 10*3/uL (ref 0.0–0.1)
Basophils Relative: 0 %
Eosinophils Absolute: 0 10*3/uL (ref 0.0–0.5)
Eosinophils Relative: 0 %
HCT: 28.7 % — ABNORMAL LOW (ref 36.0–46.0)
Hemoglobin: 9.9 g/dL — ABNORMAL LOW (ref 12.0–15.0)
Immature Granulocytes: 8 %
Lymphocytes Relative: 5 %
Lymphs Abs: 1.9 10*3/uL (ref 0.7–4.0)
MCH: 32 pg (ref 26.0–34.0)
MCHC: 34.5 g/dL (ref 30.0–36.0)
MCV: 92.9 fL (ref 80.0–100.0)
Monocytes Absolute: 1.4 10*3/uL — ABNORMAL HIGH (ref 0.1–1.0)
Monocytes Relative: 3 %
Neutro Abs: 33.8 10*3/uL — ABNORMAL HIGH (ref 1.7–7.7)
Neutrophils Relative %: 84 %
Platelets: 48 10*3/uL — ABNORMAL LOW (ref 150–400)
RBC: 3.09 MIL/uL — ABNORMAL LOW (ref 3.87–5.11)
RDW: 14.6 % (ref 11.5–15.5)
WBC: 40.2 10*3/uL — ABNORMAL HIGH (ref 4.0–10.5)
nRBC: 0.4 % — ABNORMAL HIGH (ref 0.0–0.2)

## 2019-06-30 LAB — RENAL FUNCTION PANEL
Albumin: 2.5 g/dL — ABNORMAL LOW (ref 3.5–5.0)
Albumin: 2.3 g/dL — ABNORMAL LOW (ref 3.5–5.0)
Anion gap: 13 (ref 5–15)
Anion gap: 8 (ref 5–15)
BUN: 40 mg/dL — ABNORMAL HIGH (ref 8–23)
BUN: 35 mg/dL — ABNORMAL HIGH (ref 8–23)
CO2: 22 mmol/L (ref 22–32)
CO2: 24 mmol/L (ref 22–32)
Calcium: 7.6 mg/dL — ABNORMAL LOW (ref 8.9–10.3)
Calcium: 7.3 mg/dL — ABNORMAL LOW (ref 8.9–10.3)
Chloride: 102 mmol/L (ref 98–111)
Chloride: 105 mmol/L (ref 98–111)
Creatinine, Ser: 2.34 mg/dL — ABNORMAL HIGH (ref 0.44–1.00)
Creatinine, Ser: 1.74 mg/dL — ABNORMAL HIGH (ref 0.44–1.00)
GFR calc Af Amer: 25 mL/min — ABNORMAL LOW (ref >60)
GFR calc Af Amer: 35 mL/min — ABNORMAL LOW (ref >60)
GFR calc non Af Amer: 21 mL/min — ABNORMAL LOW (ref >60)
GFR calc non Af Amer: 30 mL/min — ABNORMAL LOW (ref >60)
Glucose, Bld: 150 mg/dL — ABNORMAL HIGH (ref 70–99)
Glucose, Bld: 104 mg/dL — ABNORMAL HIGH (ref 70–99)
Phosphorus: 2.2 mg/dL — ABNORMAL LOW (ref 2.5–4.6)
Phosphorus: 1.8 mg/dL — ABNORMAL LOW (ref 2.5–4.6)
Potassium: 4.1 mmol/L (ref 3.5–5.1)
Potassium: 3.7 mmol/L (ref 3.5–5.1)
Sodium: 137 mmol/L (ref 135–145)
Sodium: 137 mmol/L (ref 135–145)

## 2019-06-30 LAB — CULTURE, BLOOD (ROUTINE X 2)
Special Requests: ADEQUATE
Special Requests: ADEQUATE

## 2019-06-30 LAB — COOXEMETRY PANEL
Carboxyhemoglobin: 0.9 % (ref 0.5–1.5)
Methemoglobin: 1 % (ref 0.0–1.5)
O2 Saturation: 67.8 %
Total hemoglobin: 9.9 g/dL — ABNORMAL LOW (ref 12.0–16.0)

## 2019-06-30 LAB — GLUCOSE, CAPILLARY
Glucose-Capillary: 141 mg/dL — ABNORMAL HIGH (ref 70–99)
Glucose-Capillary: 130 mg/dL — ABNORMAL HIGH (ref 70–99)
Glucose-Capillary: 104 mg/dL — ABNORMAL HIGH (ref 70–99)
Glucose-Capillary: 102 mg/dL — ABNORMAL HIGH (ref 70–99)
Glucose-Capillary: 98 mg/dL (ref 70–99)
Glucose-Capillary: 111 mg/dL — ABNORMAL HIGH (ref 70–99)

## 2019-06-30 LAB — ECHOCARDIOGRAM COMPLETE
Height: 64 in
Weight: 2998.26 oz

## 2019-06-30 LAB — LEGIONELLA PNEUMOPHILA SEROGP 1 UR AG: L. pneumophila Serogp 1 Ur Ag: NEGATIVE

## 2019-06-30 LAB — DIC (DISSEMINATED INTRAVASCULAR COAGULATION)PANEL
D-Dimer, Quant: 12.49 ug/mL-FEU — ABNORMAL HIGH (ref 0.00–0.50)
Fibrinogen: 648 mg/dL — ABNORMAL HIGH (ref 210–475)
INR: 1.3 — ABNORMAL HIGH (ref 0.8–1.2)
Platelets: 43 10*3/uL — ABNORMAL LOW (ref 150–400)
Prothrombin Time: 16 seconds — ABNORMAL HIGH (ref 11.4–15.2)
Smear Review: NONE SEEN
aPTT: 67 seconds — ABNORMAL HIGH (ref 24–36)

## 2019-06-30 LAB — HEPATIC FUNCTION PANEL
ALT: 47 U/L — ABNORMAL HIGH (ref 0–44)
AST: 46 U/L — ABNORMAL HIGH (ref 15–41)
Albumin: 2.5 g/dL — ABNORMAL LOW (ref 3.5–5.0)
Alkaline Phosphatase: 138 U/L — ABNORMAL HIGH (ref 38–126)
Bilirubin, Direct: 0.2 mg/dL (ref 0.0–0.2)
Indirect Bilirubin: 0.6 mg/dL (ref 0.3–0.9)
Total Bilirubin: 0.8 mg/dL (ref 0.3–1.2)
Total Protein: 5.7 g/dL — ABNORMAL LOW (ref 6.5–8.1)

## 2019-06-30 LAB — APTT: aPTT: 57 seconds — ABNORMAL HIGH (ref 24–36)

## 2019-06-30 LAB — URINE CULTURE: Culture: NO GROWTH

## 2019-06-30 LAB — HEPARIN LEVEL (UNFRACTIONATED)
Heparin Unfractionated: 0.15 IU/mL — ABNORMAL LOW (ref 0.30–0.70)
Heparin Unfractionated: 0.34 IU/mL (ref 0.30–0.70)

## 2019-06-30 LAB — D-DIMER, QUANTITATIVE: D-Dimer, Quant: 18.13 ug/mL-FEU — ABNORMAL HIGH (ref 0.00–0.50)

## 2019-06-30 LAB — MAGNESIUM: Magnesium: 2.5 mg/dL — ABNORMAL HIGH (ref 1.7–2.4)

## 2019-06-30 MED ORDER — VITAL 1.5 CAL PO LIQD
1000.0000 mL | ORAL | Status: DC
  Administered 2019-06-30 – 2019-07-07 (×6): 1000 mL
  Filled 2019-06-30 (×11): qty 1000

## 2019-06-30 MED ORDER — PHENOL 1.4 % MT LIQD
1.0000 | OROMUCOSAL | Status: DC | PRN
  Administered 2019-06-30 – 2019-07-11 (×13): 1 via OROMUCOSAL
  Filled 2019-06-30 (×3): qty 177

## 2019-06-30 MED ORDER — SODIUM PHOSPHATES 45 MMOLE/15ML IV SOLN
10.0000 mmol | Freq: Once | INTRAVENOUS | Status: AC
  Administered 2019-06-30: 09:00:00 10 mmol via INTRAVENOUS
  Filled 2019-06-30: qty 3.33

## 2019-06-30 MED ORDER — SODIUM CHLORIDE 0.9 % IV SOLN
100.0000 mg | Freq: Every day | INTRAVENOUS | Status: AC
  Administered 2019-06-30 – 2019-07-01 (×2): 100 mg via INTRAVENOUS
  Filled 2019-06-30: qty 20

## 2019-06-30 MED ORDER — ACETAMINOPHEN 160 MG/5ML PO SOLN
650.0000 mg | Freq: Four times a day (QID) | ORAL | Status: DC | PRN
  Administered 2019-07-01 – 2019-07-05 (×4): 650 mg
  Filled 2019-06-30 (×4): qty 20.3

## 2019-06-30 NOTE — Progress Notes (Signed)
Patient ID: Amber Bowman, female   DOB: 1955-07-30, 64 y.o.   MRN: [id number]    Subjective: Still anuric.  Now on CRRT.  On norephinephrine and vasopression.  Objective: Vital signs in last 24 hours: Temp:  [93.4 F (34.1 C)-97.6 F (36.4 C)] 96.7 F (35.9 C) (04/23 0240) Pulse Rate:  [57-96] 66 (04/23 0600) Resp:  [13-28] 18 (04/23 0600) BP: (79-155)/(48-80) 143/65 (04/23 0600) SpO2:  [84 %-100 %] 98 % (04/23 0600) Arterial Line BP: (83-136)/(52-88) 132/61 (04/23 0630) Weight:  [85 kg] 85 kg (04/23 0500)  Intake/Output from previous day: 04/22 0701 - 04/23 0700 In: 3911.8 [P.O.:50; I.V.:2521.9; NG/GT:690; IV Piggyback:649.9] Out: 3211 [Urine:66; Stool:400] Intake/Output this shift: No intake/output data recorded.   Lab Results: Recent Labs    06/28/19 0549 06/29/19 0353 06/30/19 0417  HGB 10.9* 10.4* 9.9*  HCT 33.5* 30.0* 28.7*   CBC Latest Ref Rng & Units 06/30/2019 06/29/2019 06/28/2019  WBC 4.0 - 10.5 K/uL 40.2(H) 33.5(H) 20.2(H)  Hemoglobin 12.0 - 15.0 g/dL 9.9(L) 10.4(L) 10.9(L)  Hematocrit 36.0 - 46.0 % 28.7(L) 30.0(L) 33.5(L)  Platelets 150 - 400 K/uL 48(L) 61(L) 67(L)     BMET Recent Labs    06/29/19 1600 06/30/19 0417  NA 136 137  K 3.8  3.8 4.1  CL 104 102  CO2 20* 22  GLUCOSE 139* 150*  BUN 46* 40*  CREATININE 3.38* 2.34*  CALCIUM 6.9* 7.6*     Studies/Results: Blood culture with E coli growth.  Final sensitivities pending.  Assessment/Plan: Left ureteral stone with sepsis: S/P left percutaneous nephrostomy tube.  Remains critically ill with anuria.  Continue supportive care and antibiotic therapy.  Will reassess for definitive therapy after resolution of acute medical condition.   LOS: 3 days   Dutch Gray 06/30/2019, 7:55 AM

## 2019-06-30 NOTE — Progress Notes (Signed)
NAME:  Amber Bowman, MRN:  [id number], DOB:  Apr 20, 1955, LOS: 3 ADMISSION DATE:  06/27/2019, CONSULTATION DATE:  06/27/2019  REFERRING MD:  Dr Varney Baas ER, CHIEF COMPLAINT:  Acute renal failure, shoock, lactic acidosis, Covid-19   Brief History   Presented with CC of shortness of breath coupled with nausea, vomiting, diarrhea, and left flank pain. Found to be COVID positive with urosepsis from pyelonephritis and obstructive uropathy.   History of present illness   64 year old relatively healthy female who has not yet been vaccinated for COVID-19.  Presented on June 26, 2019 with SIRS physiology fever 101.3, heart rate 140 and left-sided flank pain and self-reported dyspnea due to anxiety.  Had a lactic acid of 2.0, mild acute kidney injury with creatinine 1.7 and a normal white count.  She waited in the ER for 4 hours apparently and then left.  However on June 27, 2018 when she returned with nonspecific symptoms found to be hypotensive.  Per ER physician patient has had significant amount of vomiting and nausea and poor p.o. intake.  No NSAID use.  She has required 3 L of fluid and low-dose Levophed through peripheral infusion to obtain a made arterial pressure of 65 and a systolic blood pressure greater than 75.  Pulse ox reported as normal throughout the course of the stay and no respiratory issues but at time of PCCM evaluation pulse ox 94% on 2 L nasal cannula.  Mentating quite well but feeling cold.  White count low-normal.  Chest x-ray fairly clear.  Creatinine 3.13 mg percent and a lactic acid of greater than 4 mg percent.  Critical care medicine asked admit the patient.  Bedside ultrasound showed evidence of gallstones and mild biliary duct dilatation.  CC is Dr. Barry Dienes has been called by ER.  Liver function test normal other than elevated alkaline phosphatase 200.  Urine analysis on June 26, 2019: Red cells greater than 50 and white cells greater than 11 with positive nitrates  and leukocytes.  Suggestive of UTI  No significant Covid history other than age greater than 65.  She is Covid positive on testing in the ER  Past medical history positive for epilepsy/seizures on Felbatol, topiramate and Keppra  Past Medical History    has a past medical history of Colon polyps (2014), Condyloma, Depression, Epilepsy ([facility]), Osteopenia (04/2018), and Seizures ([place]).   reports that she has never smoked. She has never used smokeless tobacco.  Significant Hospital Events   4/20 Admit 4/21 percutaneous placement of nephrostomy tube   Consults:  Nephrology 4/21 Urology 4/21 IR 4/21  Procedures:  4/20 Arterial line planned 4/21 left nephrostomy tube placed bu IR   Significant Diagnostic Tests:  CT chest/abdomen/pelvis non contrast 4/20 > 1. Left-sided obstructive uropathy with 10 mm stone in the proximal left ureter causing mild hydroureteronephrosis and perinephric stranding. 2. Multiple bilateral nonobstructing renal calculi measuring up to 6 mm. 3. Small pleural effusions with basilar atelectasis. 4. Cholelithiasis with hydropic gallbladder. No evidence of acute inflammation. 5. Aortic Atherosclerosis (ICD10-I70.0).  Chest X-ray 4/20 > Central venous congestion.  US Aorta 4/20 >  1.  No abdominal aortic aneurysm identified. 2. Absence of aortic aneurysm by ultrasound does not exclude aortic dissection, intramural hematoma, or other acute aortic pathology. Consider CT angiogram to further evaluate if there is high clinical concern for aortic pathology in the setting of acute pain.  US Abdomen 4/20 >  Cholelithiasis with mild extrahepatic biliary duct distension. Correlation with liver function test  recommended.  Gallbladder wall thickness upper normal without pericholecystic fluid or sonographic Murphy sign.  Micro Data:  4/20 COVID-19 > Positive 4/20 Urine culture > negative  4/20 Blood culture > positive for Enterobacter and E.Coli  pan-sensitive  4/20 Urine streptococcal > negative  4/20 Urine Legionella >  Antimicrobials:  4/20 empiric ceftriaxone  COVID Rx Decadron 4/20 x 10 days (post cortisol) Remdesivir 4/20 x 5 days Toci (not giving pending sepsis evaluation)  Interim history/subjective:  No acute events overnight, she appears more alert and oriented this morning.   Objective   Blood pressure (!) 143/65, pulse 66, temperature (!) 96.7 F (35.9 C), temperature source Axillary, resp. rate 18, height 5\' 4"  (1.626 m), weight 85 kg, last menstrual period 02/11/2008, SpO2 98 %.        Intake/Output Summary (Last 24 hours) at 06/30/2019 0820 Last data filed at 06/30/2019 0800 Gross per 24 hour  Intake 3566.73 ml  Output 3362 ml  Net 204.73 ml   Filed Weights   06/27/19 1352 06/28/19 0916 06/30/19 0500  Weight: 75.3 kg 77.5 kg 85 kg    Examination: General: Acutely ill appearing middle aged female lying in bed in NAD HEENT: [place]/AT, MM pink/moist, PERRL,  Neuro: Alert and oriented x3, slow response time, will follow simple commands  CV: s1s2 regular rate and rhythm, no murmur, rubs, or gallops,  PULM:  Diminished breath sounds bilaterally, no added breath sounds, no increased work of breathing, oxygen saturations 94-100 on 2LNC GI: soft, bowel sounds active in all 4 quadrants, non-tender, non-distended, tolerating tube feeds  Extremities: warm/dry, non-pitting edema  Skin: Generalized bruising, all toes on bilateral feet appear dusky in appearance with delayed cap refill  Resolved Hospital Problem list     Assessment & Plan:  Severe septic shock with E.Coli bacteremia  -Patient presented with tachycardia, tachypnea, mild AMS, leukocytosis, and found to be bacteremic due to E.coli. She continues to require pressor due to hypotension and CRRT for -Source is likely secondary to acute pyelonephritis  Circulatory shock in the setting of sepsis, improving  -S/P aggressive IV hydration with worsening renal  function and oliguria  P: Continue supplemental oxygen  Continue antibiotics  Enteral hydration  Continue to wean pressor, MAP goal > 65 Monitor urine output  Obtain renal ultrasound to assess for possible paranephritic abscess, if imagine is poor will need CT ABD  Obtain ECHO and Coox  A-line remains in place  Close monitoring in the ICU setting   Mild acute hypoxemic respiratory failure in the setting of sepsis and COVID-19  Pulmonary edema  -COVID positive 4/20, no acute respiratory complaints currently  -CT chest with small pleural effusions with basilar atelectasis. P:   Supplemental oxygen requirements decreased  Encourage pulmonary hygiene  Follow cultures  Continue decadron and Remdesivir  Obtain ECHO and Coox as above  Remains 10L positive but continues to remain on pressors unsure if she would tolerate volume removal, defer to nephrology    UTI -UA positive for nitrates and bacteria, urine culture with no growth however this sample was obtain after antibiotic administration   Obstructing left kidney stone with hydroureteronephrosis  -Seen on CT scan 4/20, S/P nephrostomy tube placed by IR on 4/21. Given worsening leukocytosis and continued need of pressor need to rule out paranephritic abscess Acute kidney injury  -Baseline creatiine appears to be 0.7-0.8, creatinine peaked at 4.69 P:  Nephrology and urology following appreciate assistance  CRRT started 4/22 Urine output remains low No documented output from nephrostomy  Volume removal per Nephrology  Nephrostomy tube care  Trend Bmet  Avoid nephrotoxins  Continue IV antibiotics   History of seizures on topiramate Keppra and Felbatol  -Mentating normally at admission. 1-2 seizures at q2 months. Last seizure per BF - 3 weeks ago -Admitting provider had phone consult with Dr Rory Percy neuro with recommendations to continue Felbamate at half strength  P: Continue Keppra IV NG tube placed for administration of Topamaex  and Felbamate Seizure precautions   Gallstones with mild biliary dilatation seen on admit ultrasound 06/27/2019 -patient denise any acute abdominal pain  -CT abdomen with Cholelithiasis with hydropic gallbladder but no acute inflammation  -Lipase and amylase WNL -Surgery consulted on admission with no acute indications of surgical interventions P:   Will need follow up in the outpatient setting   At risk for DVT Elevated D-dimer in the setting of COVID  Thrombocytopenia  P: Continue heparin drip Close monitoring of thrombocytopenia  Close monitoring for bleeding  Trend CBC    Hypoglycemia  P: Improved with TF Trail off Dextrose drip   Best practice:  Diet: TF  Pain/Anxiety/Delirium protocol (if indicated): PRNs VAP protocol (if indicated):N/A DVT prophylaxis: scd but start lovenox if platelet improves GI prophylaxis: Protonix Glucose control: SSI Mobility: Bedrest Code Status: Full code Family Communication: Updated over the phone  Disposition: Admit to ICU from the [place] Performed by: Johnsie Cancel  Total critical care time: 43 minutes  Critical care time was exclusive of separately billable procedures and treating other patients.  Critical care was necessary to treat or prevent imminent or life-threatening deterioration.  Critical care was time spent personally by me on the following activities: development of treatment plan with patient and/or surrogate as well as nursing, discussions with consultants, evaluation of patient's response to treatment, examination of patient, obtaining history from patient or surrogate, ordering and performing treatments and interventions, ordering and review of laboratory studies, ordering and review of radiographic studies, pulse oximetry and re-evaluation of patient's condition.  Johnsie Cancel, NP-C [facility] Contact / Pager information can be found on Amion    06/30/2019, 8:20 AM  LABS    PULMONARY Recent Labs  Lab 06/27/19 1624 06/27/19 2338 06/28/19 0810 06/29/19 0530  PHART  --  7.393 7.319* 7.361  PCO2ART  --  18.5* 27.7* 32.5  PO2ART  --  130* 72.3* 78.0*  HCO3 13.7* 11.2* 13.8* 18.0*  O2SAT 52.3 98.2 93.8 95.1    CBC Recent Labs  Lab 06/28/19 0549 06/29/19 0353 06/30/19 0417  HGB 10.9* 10.4* 9.9*  HCT 33.5* 30.0* 28.7*  WBC 20.2* 33.5* 40.2*  PLT 67* 61* 48*    COAGULATION Recent Labs  Lab 06/27/19 1349 06/27/19 1904 06/28/19 1314  INR 1.7* 1.6* 1.6*    CARDIAC  No results for input(s): TROPONINI in the last 168 hours. No results for input(s): PROBNP in the last 168 hours.   CHEMISTRY Recent Labs  Lab 06/28/19 0549 06/28/19 0549 06/28/19 1617 06/28/19 1617 06/29/19 0353 06/29/19 0353 06/29/19 1600 06/30/19 0417  NA 141  --  139  --  138  --  136 137  K 3.8   < > 3.7   < > 3.9   < > 3.8  3.8 4.1  CL 110  --  111  --  102  --  104 102  CO2 14*  --  14*  --  17*  --  20* 22  GLUCOSE  97  --  95  --  131*  --  139* 150*  BUN 49*  --  51*  --  58*  --  46* 40*  CREATININE 3.80*  --  4.12*  --  4.69*  --  3.38* 2.34*  CALCIUM 7.4*  --  6.6*  --  6.7*  --  6.9* 7.6*  MG 1.3*  --   --   --  1.3*  --  2.7* 2.5*  PHOS 4.0  --  4.6  --  4.6  --  3.4  3.4 2.2*   < > = values in this interval not displayed.   Estimated Creatinine Clearance: 25.6 mL/min (A) (by C-G formula based on SCr of 2.34 mg/dL (H)).   LIVER Recent Labs  Lab 06/27/19 1349 06/27/19 1349 06/27/19 1904 06/27/19 1904 06/28/19 0549 06/28/19 1314 06/28/19 1617 06/29/19 0353 06/29/19 1600 06/30/19 0417  AST 43*  --  36  --  33  --   --  61*  --  46*  ALT 36  --  27  --  26  --   --  37  --  47*  ALKPHOS 200*  --  116  --  92  --   --  123  --  138*  BILITOT 2.1*  --  1.2  --  0.8  --   --  1.0  --  0.8  PROT 7.1  --  5.4*  --  5.5*  --   --  5.2*  --  5.7*  ALBUMIN 3.5   < > 2.6*   < > 2.6*  --  2.3* 2.3* 2.3* 2.5*  2.5*   INR 1.7*  --  1.6*  --   --  1.6*  --   --   --   --    < > = values in this interval not displayed.     INFECTIOUS Recent Labs  Lab 06/27/19 1356 06/27/19 1620 06/27/19 1904 06/28/19 0549 06/29/19 0353  LATICACIDVEN 4.6* 5.0*  --  4.5*  --   PROCALCITON  --   --  83.55 71.36 <0.10     ENDOCRINE CBG (last 3)  Recent Labs    06/29/19 1959 06/29/19 2307 06/30/19 0423  GLUCAP 129* 100* 141*         IMAGING x48h  - image(s) personally visualized  -   highlighted in bold DG Chest 1 View  Result Date: 06/28/2019 CLINICAL DATA:  COVID positive, central line placement. EXAM: CHEST  1 VIEW COMPARISON:  Chest radiograph and CT chest 06/27/2019. FINDINGS: Right IJ central line tip is in the SVC. No pneumothorax. Patient is slightly rotated. Trachea is midline. Heart size normal. Next articular airspace opacification with small bilateral pleural effusions. IMPRESSION: 1. Right IJ central line placement without complicating feature. 2. Pulmonary edema and small bilateral pleural effusions. Electronically Signed   By: Lorin Picket M.D.   On: 06/28/2019 11:28   DG Abd 1 View  Result Date: 06/29/2019 CLINICAL DATA:  64 year old female with feeding tube placement. EXAM: ABDOMEN - 1 VIEW COMPARISON:  CT abdomen pelvis dated 06/27/2019. FINDINGS: An enteric tube is noted with tip in the distal stomach likely in the region of the gastric antrum or pylorus. There is no bowel dilatation or evidence of obstruction. No free air identified. A left percutaneous nephrostomy is noted. Several bilateral renal calculi again seen. The osseous structures and soft tissues are grossly unremarkable. IMPRESSION: Enteric tube with tip in the distal stomach.  Electronically Signed   By: Anner Crete M.D.   On: 06/29/2019 15:35   DG CHEST PORT 1 VIEW  Result Date: 06/29/2019 CLINICAL DATA:  Fever, elevated heart rate, LEFT-sided flank pain. EXAM: PORTABLE CHEST 1 VIEW COMPARISON:  Chest x-rays dated  06/28/2019 and 06/27/2019. FINDINGS: Heart size and mediastinal contours are stable. Central pulmonary vascular congestion and bilateral perihilar interstitial edema. Small bilateral pleural effusions and bibasilar atelectasis better demonstrated on recent chest CT. No pneumothorax is seen. RIGHT IJ central line is stable in position with tip at the level of the upper SVC. IMPRESSION: 1. Central pulmonary vascular congestion and bilateral perihilar interstitial edema suggesting volume overload/CHF. 2. No evidence of pneumonia. 3. Small bilateral pleural effusions and bibasilar atelectasis better demonstrated on recent chest CT. Electronically Signed   By: Franki Cabot M.D.   On: 06/29/2019 13:09   IR NEPHROSTOMY PLACEMENT LEFT  Result Date: 06/28/2019 INDICATION: 64 year old female with urosepsis secondary to proximal ureteral stone and obstructed pyonephrosis on the left. She is critically ill and too unstable for general anesthesia and retrograde ureteral stent placement. Therefore, she presents to interventional radiology for percutaneous nephrostomy tube placement. EXAM: IR NEPHROSTOMY PLACEMENT LEFT COMPARISON:  CT abdomen/pelvis 06/27/2019 MEDICATIONS: Ancef 2 gm IV; The antibiotic was administered in an appropriate time frame prior to skin puncture. ANESTHESIA/SEDATION: Fentanyl 50 mcg IV; Versed 1 mg IV Moderate Sedation Time:  14 minutes The patient was continuously monitored during the procedure by the interventional radiology nurse under my direct supervision. CONTRAST:  27mL OMNIPAQUE IOHEXOL 300 MG/ML SOLN - administered into the collecting system(s) FLUOROSCOPY TIME:  Fluoroscopy Time: 2 minutes 36 seconds (36 mGy). COMPLICATIONS: None immediate. TECHNIQUE: The procedure, risks, benefits, and alternatives were explained to the patient. Questions regarding the procedure were encouraged and answered. The patient understands and consents to the procedure. The left flank was prepped with chlorhexidine  in a sterile fashion, and a sterile drape was applied covering the operative field. A sterile gown and sterile gloves were used for the procedure. Local anesthesia was provided with 1% Lidocaine. The left flank was interrogated with ultrasound and the left kidney identified. The kidney is hydronephrotic. A suitable access site on the skin overlying the lower pole, posterior calix was identified. After local mg anesthesia was achieved, a small skin nick was made with an 11 blade scalpel. A 21 gauge Accustick needle was then advanced under direct sonographic guidance into the lower pole of the kidney. A 0.018 inch wire was advanced under fluoroscopic guidance into the left renal collecting system. The Accustick sheath was then advanced over the wire and a 0.018 system exchanged for a 0.035 system. Gentle hand injection of contrast material confirms placement of the sheath within the renal collecting system. There is mild hydronephrosis. The tract from the scan into the renal collecting system was then dilated serially to 10-French. A 10-French Cook all-purpose drain was then placed and positioned under fluoroscopic guidance. The locking loop is well formed within the left renal pelvis. The catheter was secured to the skin with 2-0 Prolene and a sterile bandage was placed. Catheter was left to gravity bag drainage. IMPRESSION: Successful placement of a left 10 French percutaneous nephrostomy tube. Electronically Signed   By: Jacqulynn Cadet M.D.   On: 06/28/2019 16:06

## 2019-06-30 NOTE — Progress Notes (Signed)
Initial Nutrition Assessment  DOCUMENTATION CODES:   Not applicable  INTERVENTION:  Vital 1.5 @ 40 ml/hr, advance 10 ml every 4 hrs to goal rate 50 ml/hr (1200 ml/day) with 30 ml Prostat via tube BID -This regimen at goal rate will provide 2000 kcal, 111 grams of protein, and 912 ml free water   NUTRITION DIAGNOSIS:   Increased nutrient needs related to acute illness(urosepsis due to pyelonephritis and obstructive uropathy, COVID-19 virus infection) as evidenced by estimated needs.    GOAL:   Patient will meet greater than or equal to 90% of their needs    MONITOR:   PO intake, Weight trends, Labs, I & O's, TF tolerance  REASON FOR ASSESSMENT:   Consult Enteral/tube feeding initiation and management  ASSESSMENT:  RD working remotely.  64 year old female with past medical history of colon polyps, condyloma, depression, epilepsy, osteopenia presented on 06/26/19 with fever, left-sided flank pain, and dyspnea. Patient waited in ED for 4 hours and then left. She returned on 06/27/19 and found to by hypotensive and reported significant amount of nausea with vomiting and poor po intake. Bedside US showed evidence of gallstones and mild biliary duct dilation, and COVID-19 positive  Patient admitted for urosepsis from pyelonephritis and obstructive uropathy.  Significant Events: 4/20 blood culture positive for Enterobacter and E. Coli 4/21 IR percutaneous placement of nephrostomy tube 4/22 CRRT started 4/22 NGT right nare, TF initiated  I/Os: +11122 ml since admit     +700 ml x 24 hrs UOP: 66 ml x 24 hrs  Non-pitting BUE edema per RN assessment Current wt 187 lbs     Admit wt 167 lbs Limited recent weight history for review, on 11/15/18 pt weighed 166 lbs and per history weights from Feburary 2018 - December 2019 weights were stable 156 -164 lbs. Will use admit weight 75.3 kg (167 lbs) for estimating needs.   Per notes: -still anuric, urology following  -on norepinephrine  and vasopressin, continue to wean -circulatory shock in the setting of sepsis, improving -obtain renal US for possible paranephritic abscess -obtain Echo and Coox -hypoglycemia improved with TF, trail off Dextrose drip  Medications reviewed and include: decadron, felbatol, SSI, protonix, topamax Prismasol BGK 400 ml/hr CRRT Rocephin Precedex 0.2 mcg Heparin Keppra Levo 45mcg Remdesivr Sodium phosphate 10 mmol in D5 Vasopressin 11.25 ml/hr Labs: CBGs 130,141,100 x 24 hrs, BUN 40 (H) trending down, Cr 2.34 (H) trending down, P 2.2 (L)  Vital HP @ 40 ml/hr with 30 ml Prostat BID via NG -providing 1160 kcal, 114 grams of protein, and 806 ml free water  NUTRITION - FOCUSED PHYSICAL EXAM: Unable to complete at this time, RD working remotely.  Diet Order:   Diet Order            Diet regular Room service appropriate? Yes; Fluid consistency: Thin  Diet effective now              EDUCATION NEEDS:   No education needs have been identified at this time  Skin:  Skin Assessment: Skin Integrity Issues: Skin Integrity Issues:: Other (Comment) Other: MASD; buttocks  Last BM:  4/22  Height:   Ht Readings from Last 1 Encounters:  06/27/19 5\' 4"  (1.626 m)    Weight:   Wt Readings from Last 1 Encounters:  06/30/19 85 kg    Ideal Body Weight:     BMI:  Body mass index is 32.17 kg/m.  Estimated Nutritional Needs:   Kcal:  1800-2100  Protein:  105-115  Fluid:  >/= 1.8 L/day   Lajuan Lines, RD, LDN Clinical Nutrition After Hours/Weekend Pager # in [phone]

## 2019-06-30 NOTE — Progress Notes (Signed)
Echocardiogram 2D Echocardiogram has been performed.  Oneal Deputy Tymir Terral 06/30/2019, 3:13 PM

## 2019-06-30 NOTE — Progress Notes (Signed)
eLink Physician-Brief Progress Note Patient Name: Amber Bowman DOB: Aug 12, 1955 MRN: [id number]   Date of Service  06/30/2019  HPI/Events of Note  Per RN Pt complaining of non-specific discomfort involving her distal extremities, all pulses and capillary refill intact, no evidence of circulatory compromise.  eICU Interventions  No intervention at this time, prn Tylenol and monitor for resolution of symptoms.        [name] [name] 06/30/2019, 11:09 PM

## 2019-06-30 NOTE — Progress Notes (Signed)
[place] for heparin Indication: Elevated D-dimer with COVID  No Known Allergies  Patient Measurements: Height: 5\' 4"  (162.6 cm) Weight: 85 kg (187 lb 6.3 oz) IBW/kg (Calculated) : 54.7 Heparin Dosing Weight: 70.5 kg  Vital Signs: Temp: 96.9 F (36.1 C) (04/23 0800) Temp Source: Axillary (04/23 0800) BP: 143/65 (04/23 0600) Pulse Rate: 74 (04/23 0800)  Labs: Recent Labs     0000 06/27/19 1904 06/27/19 1904 06/28/19 0549 06/28/19 1314 06/28/19 1617 06/29/19 0250 06/29/19 0353 06/29/19 1130 06/29/19 1600 06/29/19 2140 06/30/19 0417 06/30/19 0920  HGB   < >  --   --  10.9*  --   --   --  10.4*  --   --   --  9.9*  --   HCT  --   --   --  33.5*  --   --   --  30.0*  --   --   --  28.7*  --   PLT   < > 80*   < > 67*  --   --   --  61*  --   --   --  48* 43*  APTT  --  51*  --   --   --   --   --   --   --   --   --  57* 67*  LABPROT  --  19.1*  --   --  18.8*  --   --   --   --   --   --   --  16.0*  INR  --  1.6*  --   --  1.6*  --   --   --   --   --   --   --  1.3*  HEPARINUNFRC  --   --   --   --   --    < >   < >  --  0.22*  --  0.30  --  0.15*  CREATININE  --  3.10*   < > 3.80*  --    < >  --  4.69*  --  3.38*  --  2.34*  --    < > = values in this interval not displayed.    Estimated Creatinine Clearance: 25.6 mL/min (A) (by C-G formula based on SCr of 2.34 mg/dL (H)).   Medical History: Past Medical History:  Diagnosis Date  . Colon polyps 2014  . Condyloma   . Depression   . Epilepsy ([place])   . Osteopenia 04/2018   T score -1.8 FRAX 9.5% / 1.1% overall stable from prior DEXA  . Seizures ([place])     Medications:  Medications Prior to Admission  Medication Sig Dispense Refill Last Dose  . aspirin 81 MG tablet Take 81 mg by mouth daily.       . betamethasone valerate ointment (VALISONE) 0.1 % Apply 1 application topically 2 (two) times daily. 30 g 1   . CRANBERRY PO Take 2 tablets by mouth daily.      . felbamate  (FELBATOL) 600 MG tablet TAKE 2 TABLETS BY MOUTH 4 TIMES A DAY (Patient taking differently: Take 1,200 mg by mouth 4 (four) times daily. ) 240 tablet 6   . levETIRAcetam (KEPPRA) 500 MG tablet TAKE 1 TABLET BY MOUTH TWICE A DAY (Patient taking differently: Take 500 mg by mouth 2 (two) times daily. ) 180 tablet 2   . metroNIDAZOLE (METROGEL) 0.75 % vaginal gel Place  1 Applicatorful vaginally 2 (two) times daily. 70 g 0   . Multiple Vitamins-Minerals (ICAPS AREDS FORMULA PO) Take by mouth.     . topiramate (TOPAMAX) 200 MG tablet TAKE 1 TABLET BY MOUTH TWICE A DAY (Patient taking differently: Take 200 mg by mouth 2 (two) times daily. ) 180 tablet 3     Assessment: Pharmacy consulted for heparin drip for "Elevated D-dimer with COVID, would like heparin drip without a bolus please".   Pt currently on SCDs for VTE px due to new onset thrombocytopenia 4/20.   Baseline labs: SCr 4.12, INR elevated at 1.6, Hg 13.6> 14.2> 10.9, PLTC 280> 89>80>67 D dimer > 20 -Chest CT negative for PE 4/21: s/p percutaneous placement of nephrostomy tube for obstructing renal calculi without any acute complications & 0000000 units heparin given today at 1151 w/ HD catheter placement  06/30/2019: - Heparin lsubtherapeutic (0.15) on heparin infusion at 1250 units/hr - CBC: Hg low & slowly decreasing (9.9, pltc low & trending down (43).  MD aware of thrombocytopenia & still wants to continue heparin. - No bleeding or infusion related issues noted by nursing  Goal of Therapy:  Heparin level 0.3-0.7 units/ml Monitor platelets by anticoagulation protocol: Yes   Plan:  Increase heparin drip to 1400 units/hr Check 8 hr heparin level after rate increase Daily heparin level and CBC while on heparin Monitor closely for s/sx bleeding  Netta Cedars, PharmD, BCPS 06/30/2019 11:00 AM

## 2019-06-30 NOTE — Progress Notes (Signed)
[facility] Progress Note  Subjective: had 190 cc uop total yest.  I/O total yest were - 1.2 L . Last cXR showed CHF/ pulm edema. Pt on nasal O2.    Vitals:   06/30/19 1215 06/30/19 1230 06/30/19 1245 06/30/19 1300  BP:      Pulse: 63 61 62 63  Resp: 17 18 17 16   Temp:      TempSrc:      SpO2: 99% 99% 100% 98%  Weight:      Height:        Exam:  pt resting, not awakened, per staff is responsive and Ox 3   No jvd   Chest dec'd BS at bases, no wheezing    Cor reg hard to hear w/ noise in the room    Abd soft ntnd     Ext diffuse 2+ UE/ LE edema    NF, Ox 3    Home meds:   Neuro - keppra 500 bid, topamax 200 bid, felbamate 1200 qid   Other - ASA 81 qd    Prn's/ vitamins/ supplements     UA - > 50 rbc, 11-20 wbc, few bact, neg protein    UNa 85,  UCr 187    CXR 4/22 - +CHF/ pulm edema  Assessment/ Plan: 1. Renal failure - acute / AKI due to septic shock/ hypoperfusion/ ATN.  SP L perc neph on 4/22 by IR. Not doing much better today. Minimal UOP. Started CRRT on 4/22. Labs okay today. CXR yest showing pulm edema, will try to pull fluid today as BP's tolerate. 2. UTI w/ L ureteral obstruction - sp L perc neph 4/21 3. Seizure d/o - on several meds at home 4. Hypotension/ shock - on pressor support 5. Volume - sig vol overload, as above 6. ^D-Dimer - on empiric IV heparin 7. Thrombocytopenia - no schistocytes per CBC review in lab section  8. +COVID-19 - asymptomatic     Amber Bowman 06/30/2019, 2:54 PM   Recent Labs  Lab 06/29/19 0353 06/29/19 0353 06/29/19 1600 06/30/19 0417  K 3.9   < > 3.8  3.8 4.1  BUN 58*   < > 46* 40*  CREATININE 4.69*   < > 3.38* 2.34*  CALCIUM 6.7*   < > 6.9* 7.6*  PHOS 4.6   < > 3.4  3.4 2.2*  HGB 10.4*  --   --  9.9*   < > = values in this interval not displayed.   Inpatient medications: . aspirin EC  81 mg Oral Daily  . Chlorhexidine Gluconate Cloth  6 each Topical Daily  . dexamethasone (DECADRON)  injection  6 mg Intravenous Q24H  . feeding supplement (PRO-STAT SUGAR FREE 64)  30 mL Per Tube BID  . felbamate  300 mg Oral Q12H  . insulin aspart  0-9 Units Subcutaneous Q4H  . mouth rinse  15 mL Mouth Rinse BID  . mouth rinse  15 mL Mouth Rinse BID  . multivitamin with minerals  1 tablet Oral Daily  . mupirocin ointment  1 application Nasal BID  . pantoprazole (PROTONIX) IV  40 mg Intravenous QHS  . sodium chloride flush  10-40 mL Intracatheter Q12H  . topiramate  200 mg Oral BID   .  prismasol BGK 4/2.5 400 mL/hr at 06/30/19 1048  .  prismasol BGK 4/2.5 200 mL/hr at 06/30/19 1120  . sodium chloride    . sodium chloride 500 mL (06/29/19 2100)  . cefTRIAXone (ROCEPHIN)  IV  Stopped (06/30/19 0141)  . dexmedetomidine (PRECEDEX) IV infusion Stopped (06/30/19 1326)  . feeding supplement (VITAL 1.5 CAL)    . heparin 1,400 Units/hr (06/30/19 1105)  . levETIRAcetam Stopped (06/30/19 1120)  . norepinephrine (LEVOPHED) Adult infusion 4 mcg/min (06/30/19 1200)  . prismasol BGK 4/2.5 1,800 mL/hr at 06/30/19 1340  . remdesivir 100 mg in NS 100 mL Stopped (06/30/19 0947)  . sodium phosphate  Dextrose 5% IVPB 10 mmol (06/30/19 0859)  . vasopressin (PITRESSIN) infusion - *FOR SHOCK* 0.03 Units/min (06/30/19 0700)   Place/Maintain arterial line **AND** sodium chloride, alteplase, fentaNYL (SUBLIMAZE) injection, heparin, heparin, ondansetron (ZOFRAN) IV, phenol, promethazine, sodium chloride, sodium chloride flush

## 2019-06-30 NOTE — Progress Notes (Signed)
Lower venous duplex       has been completed. Preliminary results can be found under CV proc through chart review. Kanylah Muench, BS, RDMS, RVT   

## 2019-06-30 NOTE — Progress Notes (Signed)
eLink Physician-Brief Progress Note Patient Name: Amber Bowman DOB: May 18, 1955 MRN: [id number]   Date of Service  06/30/2019  HPI/Events of Note  Phosphorus 2.2  eICU Interventions  Phosphorus supplementation ordered        Frederik Pear 06/30/2019, 6:40 AM

## 2019-06-30 NOTE — Progress Notes (Signed)
ANTICOAGULATION CONSULT NOTE  Pharmacy Consult for heparin Indication: Elevated D-dimer with COVID  No Known Allergies  Patient Measurements: Height: 5\' 4"  (162.6 cm) Weight: 85 kg (187 lb 6.3 oz) IBW/kg (Calculated) : 54.7 Heparin Dosing Weight: 70.5 kg  Vital Signs: Temp: 98.3 F (36.8 C) (04/23 2000) Temp Source: Axillary (04/23 2000) BP: 150/65 (04/23 1000) Pulse Rate: 87 (04/23 1830)  Labs: Recent Labs     0000 06/28/19 0549 06/28/19 1314 06/28/19 1617 06/29/19 0353 06/29/19 1130 06/29/19 1600 06/29/19 2140 06/30/19 0417 06/30/19 0920 06/30/19 1529 06/30/19 2036  HGB   < > 10.9*  --   --  10.4*  --   --   --  9.9*  --   --   --   HCT  --  33.5*  --   --  30.0*  --   --   --  28.7*  --   --   --   PLT   < > 67*  --   --  61*  --   --   --  48* 43*  --   --   APTT  --   --   --   --   --   --   --   --  57* 67*  --   --   LABPROT  --   --  18.8*  --   --   --   --   --   --  16.0*  --   --   INR  --   --  1.6*  --   --   --   --   --   --  1.3*  --   --   HEPARINUNFRC  --   --   --    < >  --    < >  --  0.30  --  0.15*  --  0.34  CREATININE  --  3.80*  --    < > 4.69*  --  3.38*  --  2.34*  --  1.74*  --    < > = values in this interval not displayed.    Estimated Creatinine Clearance: 34.4 mL/min (A) (by C-G formula based on SCr of 1.74 mg/dL (H)).   Medical History: Past Medical History:  Diagnosis Date  . Colon polyps 2014  . Condyloma   . Depression   . Epilepsy ([place])   . Osteopenia 04/2018   T score -1.8 FRAX 9.5% / 1.1% overall stable from prior DEXA  . Seizures ([place])     Medications:  Medications Prior to Admission  Medication Sig Dispense Refill Last Dose  . aspirin 81 MG tablet Take 81 mg by mouth daily.       . betamethasone valerate ointment (VALISONE) 0.1 % Apply 1 application topically 2 (two) times daily. 30 g 1   . CRANBERRY PO Take 2 tablets by mouth daily.      . felbamate (FELBATOL) 600 MG tablet TAKE 2 TABLETS BY MOUTH 4 TIMES  A DAY (Patient taking differently: Take 1,200 mg by mouth 4 (four) times daily. ) 240 tablet 6   . levETIRAcetam (KEPPRA) 500 MG tablet TAKE 1 TABLET BY MOUTH TWICE A DAY (Patient taking differently: Take 500 mg by mouth 2 (two) times daily. ) 180 tablet 2   . metroNIDAZOLE (METROGEL) 0.75 % vaginal gel Place 1 Applicatorful vaginally 2 (two) times daily. 70 g 0   . Multiple Vitamins-Minerals (ICAPS AREDS FORMULA PO) Take  by mouth.     . topiramate (TOPAMAX) 200 MG tablet TAKE 1 TABLET BY MOUTH TWICE A DAY (Patient taking differently: Take 200 mg by mouth 2 (two) times daily. ) 180 tablet 3     Assessment: Pharmacy consulted for heparin drip for "Elevated D-dimer with COVID, would like heparin drip without a bolus please".   Pt currently on SCDs for VTE px due to new onset thrombocytopenia 4/20.   Baseline labs: SCr 4.12, INR elevated at 1.6, Hg 13.6> 14.2> 10.9, PLTC 280> 89>80>67 D dimer > 20 -Chest CT negative for PE 4/21: s/p percutaneous placement of nephrostomy tube for obstructing renal calculi without any acute complications & 0000000 units heparin given today at 1151 w/ HD catheter placement  06/30/2019: - Heparin lsubtherapeutic (0.15) on heparin infusion at 1250 units/hr - CBC: Hg low & slowly decreasing (9.9, pltc low & trending down (43).  MD aware of thrombocytopenia & still wants to continue heparin. - No bleeding or infusion related issues noted by nursing - 2036 HL 0.36, therapeutic  Goal of Therapy:  Heparin level 0.3-0.7 units/ml Monitor platelets by anticoagulation protocol: Yes   Plan:  continue heparin drip at 1400 units/hr Daily heparin level and CBC while on heparin Monitor closely for s/sx bleeding  Dolly Rias RPh 06/30/2019, 9:31 PM

## 2019-07-01 ENCOUNTER — Inpatient Hospital Stay (HOSPITAL_COMMUNITY): Payer: Medicare Other

## 2019-07-01 DIAGNOSIS — N179 Acute kidney failure, unspecified: Secondary | ICD-10-CM | POA: Diagnosis not present

## 2019-07-01 DIAGNOSIS — R6520 Severe sepsis without septic shock: Secondary | ICD-10-CM

## 2019-07-01 DIAGNOSIS — A4151 Sepsis due to Escherichia coli [E. coli]: Principal | ICD-10-CM

## 2019-07-01 LAB — RENAL FUNCTION PANEL
Albumin: 2.4 g/dL — ABNORMAL LOW (ref 3.5–5.0)
Albumin: 2.3 g/dL — ABNORMAL LOW (ref 3.5–5.0)
Anion gap: 9 (ref 5–15)
Anion gap: 7 (ref 5–15)
BUN: 31 mg/dL — ABNORMAL HIGH (ref 8–23)
BUN: 29 mg/dL — ABNORMAL HIGH (ref 8–23)
CO2: 26 mmol/L (ref 22–32)
CO2: 26 mmol/L (ref 22–32)
Calcium: 7.6 mg/dL — ABNORMAL LOW (ref 8.9–10.3)
Calcium: 7.9 mg/dL — ABNORMAL LOW (ref 8.9–10.3)
Chloride: 100 mmol/L (ref 98–111)
Chloride: 102 mmol/L (ref 98–111)
Creatinine, Ser: 1.51 mg/dL — ABNORMAL HIGH (ref 0.44–1.00)
Creatinine, Ser: 1.25 mg/dL — ABNORMAL HIGH (ref 0.44–1.00)
GFR calc Af Amer: 42 mL/min — ABNORMAL LOW (ref >60)
GFR calc Af Amer: 53 mL/min — ABNORMAL LOW (ref >60)
GFR calc non Af Amer: 36 mL/min — ABNORMAL LOW (ref >60)
GFR calc non Af Amer: 45 mL/min — ABNORMAL LOW (ref >60)
Glucose, Bld: 120 mg/dL — ABNORMAL HIGH (ref 70–99)
Glucose, Bld: 101 mg/dL — ABNORMAL HIGH (ref 70–99)
Phosphorus: 2.1 mg/dL — ABNORMAL LOW (ref 2.5–4.6)
Phosphorus: 1.2 mg/dL — ABNORMAL LOW (ref 2.5–4.6)
Potassium: 4.2 mmol/L (ref 3.5–5.1)
Potassium: 3.8 mmol/L (ref 3.5–5.1)
Sodium: 135 mmol/L (ref 135–145)
Sodium: 135 mmol/L (ref 135–145)

## 2019-07-01 LAB — CBC WITH DIFFERENTIAL/PLATELET
Abs Immature Granulocytes: 9.98 10*3/uL — ABNORMAL HIGH (ref 0.00–0.07)
Basophils Absolute: 0 10*3/uL (ref 0.0–0.1)
Basophils Relative: 0 %
Eosinophils Absolute: 0.2 10*3/uL (ref 0.0–0.5)
Eosinophils Relative: 0 %
HCT: 29.4 % — ABNORMAL LOW (ref 36.0–46.0)
Hemoglobin: 10 g/dL — ABNORMAL LOW (ref 12.0–15.0)
Immature Granulocytes: 16 %
Lymphocytes Relative: 7 %
Lymphs Abs: 4.3 10*3/uL — ABNORMAL HIGH (ref 0.7–4.0)
MCH: 31.6 pg (ref 26.0–34.0)
MCHC: 34 g/dL (ref 30.0–36.0)
MCV: 93 fL (ref 80.0–100.0)
Monocytes Absolute: 3.3 10*3/uL — ABNORMAL HIGH (ref 0.1–1.0)
Monocytes Relative: 5 %
Neutro Abs: 45.8 10*3/uL — ABNORMAL HIGH (ref 1.7–7.7)
Neutrophils Relative %: 72 %
Platelets: 39 10*3/uL — ABNORMAL LOW (ref 150–400)
RBC: 3.16 MIL/uL — ABNORMAL LOW (ref 3.87–5.11)
RDW: 14.8 % (ref 11.5–15.5)
WBC: 63.6 10*3/uL (ref 4.0–10.5)
nRBC: 0.6 % — ABNORMAL HIGH (ref 0.0–0.2)

## 2019-07-01 LAB — D-DIMER, QUANTITATIVE: D-Dimer, Quant: 19.97 ug/mL-FEU — ABNORMAL HIGH (ref 0.00–0.50)

## 2019-07-01 LAB — GLUCOSE, CAPILLARY
Glucose-Capillary: 101 mg/dL — ABNORMAL HIGH (ref 70–99)
Glucose-Capillary: 117 mg/dL — ABNORMAL HIGH (ref 70–99)
Glucose-Capillary: 70 mg/dL (ref 70–99)
Glucose-Capillary: 71 mg/dL (ref 70–99)
Glucose-Capillary: 99 mg/dL (ref 70–99)
Glucose-Capillary: 99 mg/dL (ref 70–99)

## 2019-07-01 LAB — HEPATIC FUNCTION PANEL
ALT: 37 U/L (ref 0–44)
AST: 33 U/L (ref 15–41)
Albumin: 2.3 g/dL — ABNORMAL LOW (ref 3.5–5.0)
Alkaline Phosphatase: 180 U/L — ABNORMAL HIGH (ref 38–126)
Bilirubin, Direct: 0.2 mg/dL (ref 0.0–0.2)
Indirect Bilirubin: 0.4 mg/dL (ref 0.3–0.9)
Total Bilirubin: 0.6 mg/dL (ref 0.3–1.2)
Total Protein: 5.6 g/dL — ABNORMAL LOW (ref 6.5–8.1)

## 2019-07-01 LAB — QUANTIFERON-TB GOLD PLUS (RQFGPL)
QuantiFERON Mitogen Value: 0.09 IU/mL
QuantiFERON Nil Value: 0 IU/mL
QuantiFERON TB1 Ag Value: 0.01 IU/mL
QuantiFERON TB2 Ag Value: 0 IU/mL

## 2019-07-01 LAB — HEPARIN LEVEL (UNFRACTIONATED): Heparin Unfractionated: 0.32 IU/mL (ref 0.30–0.70)

## 2019-07-01 LAB — APTT: aPTT: 84 seconds — ABNORMAL HIGH (ref 24–36)

## 2019-07-01 LAB — QUANTIFERON-TB GOLD PLUS: QuantiFERON-TB Gold Plus: UNDETERMINED — AB

## 2019-07-01 LAB — MAGNESIUM: Magnesium: 2.5 mg/dL — ABNORMAL HIGH (ref 1.7–2.4)

## 2019-07-01 MED ORDER — DEXTROSE 10 % IV SOLN: INTRAVENOUS | Status: DC

## 2019-07-01 MED ORDER — GABAPENTIN 250 MG/5ML PO SOLN
100.0000 mg | Freq: Three times a day (TID) | ORAL | Status: DC
  Administered 2019-07-01 – 2019-07-02 (×5): 100 mg
  Filled 2019-07-01 (×6): qty 2

## 2019-07-01 NOTE — Progress Notes (Signed)
[place] for heparin Indication: Elevated D-dimer with COVID  No Known Allergies  Patient Measurements: Height: 5\' 4"  (162.6 cm) Weight: 82.7 kg (182 lb 5.1 oz) IBW/kg (Calculated) : 54.7 Heparin Dosing Weight: 70.5 kg  Vital Signs: Temp: 97.5 F (36.4 C) (04/24 0300) Temp Source: Axillary (04/24 0300) Pulse Rate: 65 (04/24 0700)  Labs: Recent Labs    06/28/19 1314 06/28/19 1617 06/29/19 0353 06/29/19 1130 06/29/19 2140 06/30/19 0417 06/30/19 0920 06/30/19 1529 06/30/19 2036 07/01/19 0419 07/01/19 0500  HGB  --    < > 10.4*  --   --  9.9*  --   --   --  10.0*  --   HCT  --   --  30.0*  --   --  28.7*  --   --   --  29.4*  --   PLT  --    < > 61*  --   --  48* 43*  --   --  39*  --   APTT  --   --   --   --   --  57* 67*  --   --  84*  --   LABPROT 18.8*  --   --   --   --   --  16.0*  --   --   --   --   INR 1.6*  --   --   --   --   --  1.3*  --   --   --   --   HEPARINUNFRC  --    < >  --    < >   < >  --  0.15*  --  0.34  --  0.32  CREATININE  --    < > 4.69*   < >  --  2.34*  --  1.74*  --  1.51*  --    < > = values in this interval not displayed.    Estimated Creatinine Clearance: 39.2 mL/min (A) (by C-G formula based on SCr of 1.51 mg/dL (H)).   Medical History: Past Medical History:  Diagnosis Date  . Colon polyps 2014  . Condyloma   . Depression   . Epilepsy ([place])   . Osteopenia 04/2018   T score -1.8 FRAX 9.5% / 1.1% overall stable from prior DEXA  . Seizures ([place])     Medications:  Medications Prior to Admission  Medication Sig Dispense Refill Last Dose  . aspirin 81 MG tablet Take 81 mg by mouth daily.       . betamethasone valerate ointment (VALISONE) 0.1 % Apply 1 application topically 2 (two) times daily. 30 g 1   . CRANBERRY PO Take 2 tablets by mouth daily.      . felbamate (FELBATOL) 600 MG tablet TAKE 2 TABLETS BY MOUTH 4 TIMES A DAY (Patient taking differently: Take 1,200 mg by mouth 4 (four)  times daily. ) 240 tablet 6   . levETIRAcetam (KEPPRA) 500 MG tablet TAKE 1 TABLET BY MOUTH TWICE A DAY (Patient taking differently: Take 500 mg by mouth 2 (two) times daily. ) 180 tablet 2   . metroNIDAZOLE (METROGEL) 0.75 % vaginal gel Place 1 Applicatorful vaginally 2 (two) times daily. 70 g 0   . Multiple Vitamins-Minerals (ICAPS AREDS FORMULA PO) Take by mouth.     . topiramate (TOPAMAX) 200 MG tablet TAKE 1 TABLET BY MOUTH TWICE A DAY (Patient taking differently: Take 200 mg by mouth  2 (two) times daily. ) 180 tablet 3     Assessment: Pharmacy consulted for heparin drip for "Elevated D-dimer with COVID, would like heparin drip without a bolus please".   Pt currently on SCDs for VTE px due to new onset thrombocytopenia 4/20.   Baseline labs: SCr 4.12, INR elevated at 1.6, Hg 13.6> 14.2> 10.9, PLTC 280> 89>80>67 D dimer > 20 -Chest CT negative for PE 4/21: s/p percutaneous placement of nephrostomy tube for obstructing renal calculi without any acute complications & 0000000 units heparin given today at 1151 w/ HD catheter placement  07/01/2019: - Heparin therapeutic (0.32) on heparin infusion at 1400 units/hr - CBC: Hg low/relatively stable ~10; pltc low & trending down (39).  MD aware of thrombocytopenia & still wants to continue heparin. - No bleeding or infusion related issues noted by nursing  Goal of Therapy:  Heparin level 0.3-0.7 units/ml Monitor platelets by anticoagulation protocol: Yes   Plan:  Continue heparin drip at 1400 units/hr Daily heparin level and CBC while on heparin Monitor closely for s/sx bleeding  Netta Cedars, PharmD, BCPS 07/01/2019 8:03 AM

## 2019-07-01 NOTE — Progress Notes (Signed)
NAME:  Amber Bowman, MRN:  [id number], DOB:  26-Mar-1955, LOS: 4 ADMISSION DATE:  06/27/2019, CONSULTATION DATE:  06/27/2019  REFERRING MD:  Dr Varney Baas ER, CHIEF COMPLAINT:  Acute renal failure, shoock, lactic acidosis, Covid-19   Brief History   Presented with CC of shortness of breath coupled with nausea, vomiting, diarrhea, and left flank pain. Found to be COVID positive with urosepsis from pyelonephritis and obstructive uropathy.   History of present illness   64 year old relatively healthy female who has not yet been vaccinated for COVID-19.  Presented on June 26, 2019 with SIRS physiology fever 101.3, heart rate 140 and left-sided flank pain and self-reported dyspnea due to anxiety.  Had a lactic acid of 2.0, mild acute kidney injury with creatinine 1.7 and a normal white count.  She waited in the ER for 4 hours apparently and then left.  However on June 27, 2018 when she returned with nonspecific symptoms found to be hypotensive.  Per ER physician patient has had significant amount of vomiting and nausea and poor p.o. intake.  No NSAID use.  She has required 3 L of fluid and low-dose Levophed through peripheral infusion to obtain a made arterial pressure of 65 and a systolic blood pressure greater than 75.  Pulse ox reported as normal throughout the course of the stay and no respiratory issues but at time of PCCM evaluation pulse ox 94% on 2 L nasal cannula.  Mentating quite well but feeling cold.  White count low-normal.  Chest x-ray fairly clear.  Creatinine 3.13 mg percent and a lactic acid of greater than 4 mg percent.  Critical care medicine asked admit the patient.  Bedside ultrasound showed evidence of gallstones and mild biliary duct dilatation.  CC is Dr. Barry Dienes has been called by ER.  Liver function test normal other than elevated alkaline phosphatase 200.  Urine analysis on June 26, 2019: Red cells greater than 50 and white cells greater than 11 with positive nitrates  and leukocytes.  Suggestive of UTI  No significant Covid history other than age greater than 31.  She is Covid positive on testing in the ER  Past medical history positive for epilepsy/seizures on Felbatol, topiramate and Keppra  Past Medical History    has a past medical history of Colon polyps (2014), Condyloma, Depression, Epilepsy ([place]), Osteopenia (04/2018), and Seizures ([place]).   reports that she has never smoked. She has never used smokeless tobacco.  Significant Hospital Events   4/20 Admit 4/21 percutaneous placement of nephrostomy tube   Consults:  Nephrology 4/21 Urology 4/21 IR 4/21  Procedures:  4/20 Arterial line planned 4/21 left nephrostomy tube placed bu IR   Significant Diagnostic Tests:  CT chest/abdomen/pelvis non contrast 4/20 > 1. Left-sided obstructive uropathy with 10 mm stone in the proximal left ureter causing mild hydroureteronephrosis and perinephric stranding. 2. Multiple bilateral nonobstructing renal calculi measuring up to 6 mm. 3. Small pleural effusions with basilar atelectasis. 4. Cholelithiasis with hydropic gallbladder. No evidence of acute inflammation. 5. Aortic Atherosclerosis (ICD10-I70.0).  Chest X-ray 4/20 > Central venous congestion.  US Aorta 4/20 >  1.  No abdominal aortic aneurysm identified. 2. Absence of aortic aneurysm by ultrasound does not exclude aortic dissection, intramural hematoma, or other acute aortic pathology. Consider CT angiogram to further evaluate if there is high clinical concern for aortic pathology in the setting of acute pain.  US Abdomen 4/20 >  Cholelithiasis with mild extrahepatic biliary duct distension. Correlation with liver function test  recommended.  Gallbladder wall thickness upper normal without pericholecystic fluid or sonographic Murphy sign.  Micro Data:  4/20 COVID-19 > Positive 4/20 Urine culture > negative  4/20 Blood culture > positive for Enterobacter and E.Coli  pan-sensitive  4/20 Urine streptococcal > negative  4/20 Urine Legionella >  Antimicrobials:  4/20 empiric ceftriaxone  COVID Rx Decadron 4/20 x 10 days (post cortisol) Remdesivir 4/20 x 5 days Toci (not giving pending sepsis evaluation)  Interim history/subjective:  Alert and interactive Complaining of pain and discomfort fingertips and toes  Objective   Blood pressure (!) 150/65, pulse 65, temperature (!) 97.5 F (36.4 C), temperature source Axillary, resp. rate 18, height 5\' 4"  (1.626 m), weight 82.7 kg, last menstrual period 02/11/2008, SpO2 97 %.        Intake/Output Summary (Last 24 hours) at 07/01/2019 0744 Last data filed at 07/01/2019 0700 Gross per 24 hour  Intake 2732.46 ml  Output 4192 ml  Net -1459.54 ml   Filed Weights   06/28/19 0916 06/30/19 0500 07/01/19 0500  Weight: 77.5 kg 85 kg 82.7 kg    Examination: General: Acutely ill-appearing, HEENT: Moist oral mucosa Neuro: Alert and oriented x3 CV: S1-S2 appreciated, no murmur PULM: Decreased breath sounds bilaterally, on oxygen supplementation, does appear comfortable GI: soft, bowel sounds active in all 4 quadrants, non-tender, non-distended, tolerating tube feeds  Extremities: warm/dry, non-pitting edema  Skin: Toes and fingertips appear dusky  Resolved Hospital Problem list     Assessment & Plan:  Severe septic shock with E.Coli bacteremia  -E. coli bacteremia -Likely secondary to pyelonephritis -Continue hydration -Continue supplemental oxygen  -Continue antibiotics -On CRRT -Continue close monitoring  Mild acute hypoxemic respiratory failure in the setting of sepsis and COVID-19  Pulmonary edema  -COVID positive 4/20, no acute respiratory complaints currently  -CT chest with small pleural effusions with basilar atelectasis. -Continue Decadron and remdesivir -Fluid being pulled via CRRT  UTI Obstructing left kidney stone with hydroureteronephrosis  -Seen on CT scan 4/20, S/P nephrostomy  tube placed by IR on 4/21. Given worsening leukocytosis and continued need of pressor need to rule out paranephritic abscess  Acute kidney injury  CRRT started 4/22 Appreciate renal assistance with management Urine output remains low Avoid nephrotoxic's Continue antibiotics Nephrostomy tube care  History of seizures on topiramate Keppra and Felbatol  -Continue Keppra IV -NG tube placed for administration of Topamaex and Felbamate -Seizure precautions   Gallstones with mild biliary dilatation seen on admit ultrasound 06/27/2019 -patient denise any acute abdominal pain  -CT abdomen with Cholelithiasis with hydropic gallbladder but no acute inflammation  -Lipase and amylase WNL -Surgery consulted on admission with no acute indications of surgical interventions -Will need follow up in the outpatient setting   At risk for DVT Elevated D-dimer in the setting of COVID  Thrombocytopenia  P: Continue heparin drip Close monitoring of thrombocytopenia  Close monitoring for bleeding  Trend CBC    Hypoglycemia  P: Improved with TF Trail off Dextrose drip   Best practice:  Diet: TF  Pain/Anxiety/Delirium protocol (if indicated): PRNs VAP protocol (if indicated):N/A DVT prophylaxis: scd but start lovenox if platelet improves GI prophylaxis: Protonix Glucose control: SSI Mobility: Bedrest Code Status: Full code Family Communication: Updated over the phone  Disposition: Admit to ICU from the ER   The patient is critically ill with multiple organ systems failure and requires high complexity decision making for assessment and support, frequent evaluation and titration of therapies, application of advanced monitoring technologies and extensive interpretation  of multiple databases. Critical Care Time devoted to patient care services described in this note independent of APP/resident time (if applicable)  is 32 minutes.   Sherrilyn Rist MD [facility] Personal pager:  [phone] If unanswered, please page CCM On-call: [phone]

## 2019-07-01 NOTE — Progress Notes (Signed)
[facility] Progress Note  Subjective: 1.1 L net neg yest , on 6 L hfnc today. Some uop yest 100 cc  Vitals:   07/01/19 1000 07/01/19 1100 07/01/19 1200 07/01/19 1300  BP:      Pulse: 84 68 75 79  Resp: (!) 25 19 20  (!) 25  Temp: (!) 97.5 F (36.4 C)     TempSrc: Oral     SpO2: 97% 95% 92% 91%  Weight:      Height:        Exam:   Patient not examined today directly given COVID-19 + status, utilizing data taken from chart +/- discussions w/ providers and staff.      Home meds:   Neuro - keppra 500 bid, topamax 200 bid, felbamate 1200 qid   Other - ASA 81 qd    Prn's/ vitamins/ supplements     UA - > 50 rbc, 11-20 wbc, few bact, neg protein    UNa 85,  UCr 187    CXR 4/22 - +CHF/ pulm edema  Assessment/ Plan: 1. Renal failure - acute / AKI due to septic shock/ hypoperfusion/ ATN.  SP L perc neph on 4/22 by IR. CRRT started 4/22. Off pressors now and making small amts of urine. Sig vol overloaded w/ persistent pulm edema last CXR. Will plan ^UF today , see how BP will tolerate. Cont CRRT.  2. Resp failure - bilat edema/ infiltrates 3. EColi sepsis / bacteremia - 4/20 blood cx's +2/2, perc neph urine culture from 4/21 neg to date. Suspect urosepsis.  4. L ureteral stone/ hydronephrosis - sp L PCN 4/21 5. Seizure d/o - on several meds at home 6. Hypotension/ shock - shock resolved, off pressors. WBC still rising though 7. Volume - sig vol overload, 7- 9kg up from admit wts 8. ^D-Dimer - on empiric IV heparin 9. Thrombocytopenia - no schistocytes per CBC review  10. +COVID-19     Kelly Splinter 07/01/2019, 1:57 PM   Recent Labs  Lab 06/30/19 0417 06/30/19 0417 06/30/19 1529 07/01/19 0419  K 4.1   < > 3.7 4.2  BUN 40*   < > 35* 31*  CREATININE 2.34*   < > 1.74* 1.51*  CALCIUM 7.6*   < > 7.3* 7.6*  PHOS 2.2*   < > 1.8* 2.1*  HGB 9.9*  --   --  10.0*   < > = values in this interval not displayed.   Inpatient medications: . aspirin EC  81 mg  Oral Daily  . Chlorhexidine Gluconate Cloth  6 each Topical Daily  . dexamethasone (DECADRON) injection  6 mg Intravenous Q24H  . feeding supplement (PRO-STAT SUGAR FREE 64)  30 mL Per Tube BID  . felbamate  300 mg Oral Q12H  . gabapentin  100 mg Per Tube TID  . insulin aspart  0-9 Units Subcutaneous Q4H  . mouth rinse  15 mL Mouth Rinse BID  . mouth rinse  15 mL Mouth Rinse BID  . multivitamin with minerals  1 tablet Oral Daily  . mupirocin ointment  1 application Nasal BID  . pantoprazole (PROTONIX) IV  40 mg Intravenous QHS  . sodium chloride flush  10-40 mL Intracatheter Q12H  . topiramate  200 mg Oral BID   .  prismasol BGK 4/2.5 400 mL/hr at 07/01/19 1221  .  prismasol BGK 4/2.5 200 mL/hr at 07/01/19 1217  . sodium chloride    . sodium chloride Stopped (07/01/19 0009)  . cefTRIAXone (ROCEPHIN)  IV  Stopped (06/30/19 2355)  . dexmedetomidine (PRECEDEX) IV infusion Stopped (07/01/19 [id number])  . feeding supplement (VITAL 1.5 CAL) 1,000 mL (07/01/19 1115)  . heparin 1,400 Units/hr (07/01/19 1300)  . levETIRAcetam Stopped (07/01/19 [id number])  . norepinephrine (LEVOPHED) Adult infusion Stopped (06/30/19 1605)  . prismasol BGK 4/2.5 1,800 mL/hr at 07/01/19 1121  . vasopressin (PITRESSIN) infusion - *FOR SHOCK* Stopped (06/30/19 1749)   Place/Maintain arterial line **AND** sodium chloride, acetaminophen (TYLENOL) oral liquid 160 mg/5 mL, alteplase, fentaNYL (SUBLIMAZE) injection, heparin, heparin, ondansetron (ZOFRAN) IV, phenol, sodium chloride, sodium chloride flush

## 2019-07-01 NOTE — Progress Notes (Signed)
Per Dr Jonnie Finner at bedside, we will take off 236ml/hr for 8 hours then notify him for further fluid removal goal.

## 2019-07-01 NOTE — Progress Notes (Signed)
Per Dr Jonnie Finner, goal for fluid removal should be 150 to 200 overnight

## 2019-07-01 NOTE — Progress Notes (Signed)
eLink Physician-Brief Progress Note Patient Name: Amber Bowman DOB: 06-Apr-1955 MRN: [id number]   Date of Service  07/01/2019  HPI/Events of Note  Hypoglycemia.  eICU Interventions  Start D10 at 20cc/hr in addition to existing TF @ 50cc/hr.     Intervention Category Intermediate Interventions: Other:  Charlott Rakes 07/01/2019, 8:40 PM

## 2019-07-01 NOTE — Progress Notes (Signed)
eLink Physician-Brief Progress Note Patient Name: Amber Bowman DOB: 10-29-1955 MRN: [id number]   Date of Service  07/01/2019  HPI/Events of Note  Peripheral neuropathy?  eICU Interventions  Neurontin 100 mg tid via NG tube.        Nomi Rudnicki U Ocie Stanzione 07/01/2019, 1:50 AM

## 2019-07-01 NOTE — Progress Notes (Signed)
Patient blood glucose at 71. MD ordered Dextrose 10 at 53ml/hr in addition to TF at 79ml/hr. RN will administer and continue to monitor.

## 2019-07-01 NOTE — Progress Notes (Signed)
Pt c/o tingling on left side (from fingers to toes). 2+ pulses in all extremities.  Discoloration noted in finger tips and dorsal side of toes. Bilaterally finger tips and toes are luke warm (improvement from 2days ago). Pt able to move all extremities on command and able to make a fist bilaterally. Unable to complete cap refill d/t gel polish. Skin in all areas are blanchable w/ color returning < 3sec.  Relevant info: pt taken off pressors 4/23 ~1500  Pt requiring med intervention every 2hrs for the discomfort  MD aware RN will continue to monitor

## 2019-07-02 ENCOUNTER — Inpatient Hospital Stay (HOSPITAL_COMMUNITY): Payer: Medicare Other

## 2019-07-02 DIAGNOSIS — N179 Acute kidney failure, unspecified: Secondary | ICD-10-CM | POA: Diagnosis not present

## 2019-07-02 DIAGNOSIS — B9620 Unspecified Escherichia coli [E. coli] as the cause of diseases classified elsewhere: Secondary | ICD-10-CM | POA: Diagnosis not present

## 2019-07-02 DIAGNOSIS — U071 COVID-19: Secondary | ICD-10-CM | POA: Diagnosis not present

## 2019-07-02 DIAGNOSIS — R7881 Bacteremia: Secondary | ICD-10-CM | POA: Diagnosis not present

## 2019-07-02 LAB — RENAL FUNCTION PANEL
Albumin: 2.2 g/dL — ABNORMAL LOW (ref 3.5–5.0)
Albumin: 2.2 g/dL — ABNORMAL LOW (ref 3.5–5.0)
Anion gap: 7 (ref 5–15)
Anion gap: 7 (ref 5–15)
BUN: 30 mg/dL — ABNORMAL HIGH (ref 8–23)
BUN: 31 mg/dL — ABNORMAL HIGH (ref 8–23)
CO2: 26 mmol/L (ref 22–32)
CO2: 25 mmol/L (ref 22–32)
Calcium: 7.9 mg/dL — ABNORMAL LOW (ref 8.9–10.3)
Calcium: 7.8 mg/dL — ABNORMAL LOW (ref 8.9–10.3)
Chloride: 102 mmol/L (ref 98–111)
Chloride: 100 mmol/L (ref 98–111)
Creatinine, Ser: 1.27 mg/dL — ABNORMAL HIGH (ref 0.44–1.00)
Creatinine, Ser: 1.37 mg/dL — ABNORMAL HIGH (ref 0.44–1.00)
GFR calc Af Amer: 52 mL/min — ABNORMAL LOW (ref >60)
GFR calc Af Amer: 47 mL/min — ABNORMAL LOW (ref >60)
GFR calc non Af Amer: 45 mL/min — ABNORMAL LOW (ref >60)
GFR calc non Af Amer: 41 mL/min — ABNORMAL LOW (ref >60)
Glucose, Bld: 132 mg/dL — ABNORMAL HIGH (ref 70–99)
Glucose, Bld: 135 mg/dL — ABNORMAL HIGH (ref 70–99)
Phosphorus: 1.3 mg/dL — ABNORMAL LOW (ref 2.5–4.6)
Phosphorus: 1.5 mg/dL — ABNORMAL LOW (ref 2.5–4.6)
Potassium: 4.2 mmol/L (ref 3.5–5.1)
Potassium: 3.6 mmol/L (ref 3.5–5.1)
Sodium: 135 mmol/L (ref 135–145)
Sodium: 132 mmol/L — ABNORMAL LOW (ref 135–145)

## 2019-07-02 LAB — CBC WITH DIFFERENTIAL/PLATELET
Abs Immature Granulocytes: 6.36 10*3/uL — ABNORMAL HIGH (ref 0.00–0.07)
Band Neutrophils: 2 %
Basophils Absolute: 0.9 10*3/uL — ABNORMAL HIGH (ref 0.0–0.1)
Basophils Relative: 1 %
Blasts: 0 %
Eosinophils Absolute: 0 10*3/uL (ref 0.0–0.5)
Eosinophils Relative: 0 %
HCT: 33.8 % — ABNORMAL LOW (ref 36.0–46.0)
Hemoglobin: 11.1 g/dL — ABNORMAL LOW (ref 12.0–15.0)
Lymphocytes Relative: 6 %
Lymphs Abs: 5.4 10*3/uL — ABNORMAL HIGH (ref 0.7–4.0)
MCH: 30.2 pg (ref 26.0–34.0)
MCHC: 32.8 g/dL (ref 30.0–36.0)
MCV: 92.1 fL (ref 80.0–100.0)
Metamyelocytes Relative: 0 %
Monocytes Absolute: 3.6 10*3/uL — ABNORMAL HIGH (ref 0.1–1.0)
Monocytes Relative: 4 %
Myelocytes: 4 %
Neutro Abs: 74.5 10*3/uL — ABNORMAL HIGH (ref 1.7–7.7)
Neutrophils Relative %: 80 %
Other: 0 %
Platelets: 58 10*3/uL — ABNORMAL LOW (ref 150–400)
Promyelocytes Relative: 3 %
RBC: 3.67 MIL/uL — ABNORMAL LOW (ref 3.87–5.11)
RDW: 14.8 % (ref 11.5–15.5)
WBC: 90.8 10*3/uL (ref 4.0–10.5)
nRBC: 1.4 % — ABNORMAL HIGH (ref 0.0–0.2)
nRBC: 2 /100 WBC — ABNORMAL HIGH

## 2019-07-02 LAB — GLUCOSE, CAPILLARY
Glucose-Capillary: 132 mg/dL — ABNORMAL HIGH (ref 70–99)
Glucose-Capillary: 119 mg/dL — ABNORMAL HIGH (ref 70–99)
Glucose-Capillary: 90 mg/dL (ref 70–99)
Glucose-Capillary: 121 mg/dL — ABNORMAL HIGH (ref 70–99)
Glucose-Capillary: 105 mg/dL — ABNORMAL HIGH (ref 70–99)
Glucose-Capillary: 114 mg/dL — ABNORMAL HIGH (ref 70–99)

## 2019-07-02 LAB — HEPATIC FUNCTION PANEL
ALT: 42 U/L (ref 0–44)
AST: 36 U/L (ref 15–41)
Albumin: 2.2 g/dL — ABNORMAL LOW (ref 3.5–5.0)
Alkaline Phosphatase: 235 U/L — ABNORMAL HIGH (ref 38–126)
Bilirubin, Direct: 0.1 mg/dL (ref 0.0–0.2)
Indirect Bilirubin: 0.6 mg/dL (ref 0.3–0.9)
Total Bilirubin: 0.7 mg/dL (ref 0.3–1.2)
Total Protein: 5.9 g/dL — ABNORMAL LOW (ref 6.5–8.1)

## 2019-07-02 LAB — MAGNESIUM: Magnesium: 2.5 mg/dL — ABNORMAL HIGH (ref 1.7–2.4)

## 2019-07-02 LAB — C DIFFICILE (CDIFF) QUICK SCRN (NO PCR REFLEX)
C Diff antigen: NEGATIVE
C Diff interpretation: NOT DETECTED
C Diff toxin: NEGATIVE

## 2019-07-02 LAB — HEPARIN LEVEL (UNFRACTIONATED): Heparin Unfractionated: 0.31 IU/mL (ref 0.30–0.70)

## 2019-07-02 LAB — D-DIMER, QUANTITATIVE: D-Dimer, Quant: 4.84 ug/mL-FEU — ABNORMAL HIGH (ref 0.00–0.50)

## 2019-07-02 LAB — APTT: aPTT: 60 seconds — ABNORMAL HIGH (ref 24–36)

## 2019-07-02 MED ORDER — SODIUM PHOSPHATES 45 MMOLE/15ML IV SOLN
10.0000 mmol | Freq: Once | INTRAVENOUS | Status: AC
  Administered 2019-07-02: 12:00:00 10 mmol via INTRAVENOUS
  Filled 2019-07-02: qty 3.33

## 2019-07-02 MED ORDER — ADULT MULTIVITAMIN LIQUID CH
15.0000 mL | Freq: Every day | ORAL | Status: DC
  Administered 2019-07-02 – 2019-07-08 (×6): 15 mL
  Filled 2019-07-02 (×5): qty 15

## 2019-07-02 MED ORDER — FELBAMATE 600 MG/5ML PO SUSP
300.0000 mg | Freq: Two times a day (BID) | ORAL | Status: DC
  Administered 2019-07-02 – 2019-07-09 (×15): 300 mg
  Filled 2019-07-02 (×15): qty 2.5

## 2019-07-02 MED ORDER — TOPIRAMATE 100 MG PO TABS
200.0000 mg | ORAL_TABLET | Freq: Two times a day (BID) | ORAL | Status: DC
  Administered 2019-07-02 – 2019-07-09 (×15): 200 mg
  Filled 2019-07-02 (×14): qty 2

## 2019-07-02 MED ORDER — PANTOPRAZOLE SODIUM 40 MG PO PACK
40.0000 mg | PACK | Freq: Every day | ORAL | Status: DC
  Administered 2019-07-02 – 2019-07-08 (×7): 40 mg
  Filled 2019-07-02 (×7): qty 20

## 2019-07-02 MED ORDER — LEVETIRACETAM 100 MG/ML PO SOLN
500.0000 mg | Freq: Two times a day (BID) | ORAL | Status: DC
  Administered 2019-07-02 – 2019-07-09 (×15): 500 mg
  Filled 2019-07-02 (×15): qty 5

## 2019-07-02 MED ORDER — ASPIRIN 81 MG PO CHEW
81.0000 mg | CHEWABLE_TABLET | Freq: Every day | ORAL | Status: DC
  Administered 2019-07-02: 09:00:00 81 mg
  Filled 2019-07-02: qty 1

## 2019-07-02 NOTE — Progress Notes (Signed)
[facility] Progress Note  Subjective: 1.1 L net neg yest , on 6 L hfnc today. Some uop yest 100 cc  Vitals:   07/02/19 1000 07/02/19 1100 07/02/19 1200 07/02/19 1300  BP:      Pulse: 93 88 89 81  Resp: (!) 24 (!) 21 (!) 22 16  Temp:      TempSrc:      SpO2: 97% 98% 99% 98%  Weight:      Height:        Exam:   Pt seen in room   Alert, looks better    No jvd   Chest cta bilat    Cor reg     Abd soft ntnd     Ext diffuse 2+ pitting edema    Home meds:   Neuro - keppra 500 bid, topamax 200 bid, felbamate 1200 qid   Other - ASA 81 qd    Prn's/ vitamins/ supplements     UA - > 50 rbc, 11-20 wbc, few bact, neg protein    UNa 85,  UCr 187    CXR 4/22 - +CHF/ pulm edema  Assessment/ Plan: 1. Renal failure - acute / AKI due to septic shock/ hypoperfusion/ ATN.  SP L perc neph on 4/22 by IR. CRRT started 4/22. Off pressors now and making small amts of urine. Cont CRRT, hopeful for renal recovery.  - hypophosphatemia > getting IV phos per pharm/ CCM 2. Vol overload - wt's down a little, - 6 L last 24hrs, still sig peripheral edema. Cont to pull 100- 200 cc/hr as BP's tolerate.  3. Resp failure - bilat infiltrates much better w/ vol removal ,  down to 4L 4. EColi sepsis / bacteremia - 4/20 blood cx's +2/2, perc neph urine culture from 4/21 neg. Urosepsis. WBC still rising, for repeat CT today.  5. L ureteral stone/ hydronephrosis - sp L PCN 4/21 6. Seizure d/o - on several meds at home 7. Hypotension/ shock - shock resolved, off pressors 8. Thrombocytopenia - no schistocytes per CBC review  9. +COVID-19     Kelly Splinter 07/02/2019, 2:29 PM   Recent Labs  Lab 07/01/19 0419 07/01/19 0419 07/01/19 1618 07/02/19 0400  K 4.2   < > 3.8 4.2  BUN 31*   < > 29* 30*  CREATININE 1.51*   < > 1.25* 1.27*  CALCIUM 7.6*   < > 7.9* 7.9*  PHOS 2.1*   < > 1.2* 1.3*  HGB 10.0*  --   --  11.1*   < > = values in this interval not displayed.   Inpatient  medications: . aspirin  81 mg Per Tube Daily  . Chlorhexidine Gluconate Cloth  6 each Topical Daily  . dexamethasone (DECADRON) injection  6 mg Intravenous Q24H  . feeding supplement (PRO-STAT SUGAR FREE 64)  30 mL Per Tube BID  . felbamate  300 mg Per Tube Q12H  . gabapentin  100 mg Per Tube TID  . insulin aspart  0-9 Units Subcutaneous Q4H  . levETIRAcetam  500 mg Per Tube BID  . mouth rinse  15 mL Mouth Rinse BID  . mouth rinse  15 mL Mouth Rinse BID  . multivitamin  15 mL Per Tube Daily  . pantoprazole sodium  40 mg Per Tube QHS  . sodium chloride flush  10-40 mL Intracatheter Q12H  . topiramate  200 mg Per Tube BID   .  prismasol BGK 4/2.5 400 mL/hr at 07/02/19 1413  .  prismasol BGK 4/2.5 200 mL/hr at 07/02/19 1408  . sodium chloride    . sodium chloride Stopped (07/01/19 0009)  . cefTRIAXone (ROCEPHIN)  IV Stopped (07/01/19 2346)  . dextrose 20 mL/hr at 07/02/19 1100  . feeding supplement (VITAL 1.5 CAL) 1,000 mL (07/02/19 1309)  . heparin 1,400 Units/hr (07/02/19 1100)  . prismasol BGK 4/2.5 1,800 mL/hr at 07/02/19 1308  . sodium phosphate  Dextrose 5% IVPB 42 mL/hr at 07/02/19 1400   Place/Maintain arterial line **AND** sodium chloride, acetaminophen (TYLENOL) oral liquid 160 mg/5 mL, alteplase, fentaNYL (SUBLIMAZE) injection, heparin, heparin, ondansetron (ZOFRAN) IV, phenol, sodium chloride, sodium chloride flush

## 2019-07-02 NOTE — Progress Notes (Signed)
CT report noted  Increased effusion/atelectasis Gallbladder size smaller Nephrostomy tube in place  No other acute findings  Will continue to monitor, retract gastric tube

## 2019-07-02 NOTE — Progress Notes (Signed)
Notified Dr Ander Slade that pt's lips have become swollen, painful and ulcerated. Pt's denies ever having fever blisters

## 2019-07-02 NOTE — Consult Note (Signed)
[facility]  Total days of antibiotics 6        Day 5 ceftriaxone/remdesivir               Reason for Consult: diarrhea/leukocytosis    Referring Physician: PCCM  Active Problems:   Acute renal failure (ARF) ([place])    HPI: Amber Bowman is a 64 y.o. female admitted with sepsis, with major complaint of left sided flank pain and decrease urine output. Initially presented to ED, left without being seen due to wait time and returned the following day with worsening vital signs, leukocytosis of 20K, plus testing positive for COVID- no respiratory complaints. Work up shows that she had left obstructive uropahty, and hydroureteronephrosis, and bilateral non obstructing renal calculi s/p left nephrostomy tube in place, worsening acidosis due to her AKI requiring CRRT. Infectious work up shows ecoli bacteremia but also having perfuse diarrhea starting on evening of 4/22, and WBC on admit of 20 steadily climbing to 90K. She reports still feeing fatigue, back pain. Not having significant abdominal pain nor cramping. Maintaining on 2L  while receiving remdesivir for covid treatment. abd ct today shows gallstones some dilation to GB but improved from prior imaging, no stranding. Diverticulosis but no signs of colitis.  Past Medical History:  Diagnosis Date  . Colon polyps 2014  . Condyloma   . Depression   . Epilepsy ([place])   . Osteopenia 04/2018   T score -1.8 FRAX 9.5% / 1.1% overall stable from prior DEXA  . Seizures ([facility])     Allergies: No Known Allergies  MEDICATIONS: . aspirin  81 mg Per Tube Daily  . Chlorhexidine Gluconate Cloth  6 each Topical Daily  . dexamethasone (DECADRON) injection  6 mg Intravenous Q24H  . feeding supplement (PRO-STAT SUGAR FREE 64)  30 mL Per Tube BID  . felbamate  300 mg Per Tube Q12H  . gabapentin  100 mg Per Tube TID  . insulin aspart  0-9 Units Subcutaneous Q4H  . levETIRAcetam  500 mg Per Tube BID  . mouth rinse  15 mL Mouth  Rinse BID  . mouth rinse  15 mL Mouth Rinse BID  . multivitamin  15 mL Per Tube Daily  . pantoprazole sodium  40 mg Per Tube QHS  . sodium chloride flush  10-40 mL Intracatheter Q12H  . topiramate  200 mg Per Tube BID    Social History   Tobacco Use  . Smoking status: Never Smoker  . Smokeless tobacco: Never Used  Substance Use Topics  . Alcohol use: No    Alcohol/week: 0.0 standard drinks  . Drug use: No    Family History  Problem Relation Age of Onset  . Hypertension Mother   . Diabetes Mother   . Heart disease Mother   . Hypertension Father   . Heart disease Father   . Diabetes Sister   . Hypertension Sister   . Heart disease Sister   . Seizures Neg Hx     Review of Systems  Constitutional: + for fever, chills, diaphoresis, activity change, appetite change, fatigue and unexpected weight change.  HENT: Negative for congestion, sore throat, rhinorrhea, sneezing, trouble swallowing and sinus pressure.  Eyes: Negative for photophobia and visual disturbance.  Respiratory: Negative for cough, chest tightness, shortness of breath, wheezing and stridor.  Cardiovascular: Negative for chest pain, palpitations and leg swelling.  Gastrointestinal: Negative for nausea, vomiting, abdominal pain, diarrhea, constipation, blood in stool, abdominal distention and anal bleeding.  Genitourinary: + for dysuria, hematuria, flank pain and difficulty urinating.  Musculoskeletal: Negative for myalgias, back pain, joint swelling, arthralgias and gait problem.  Skin: Negative for color change, pallor, rash and wound.  Neurological: Negative for dizziness, tremors, weakness and light-headedness.  Hematological: Negative for adenopathy. Does not bruise/bleed easily.  Psychiatric/Behavioral: Negative for behavioral problems, confusion, sleep disturbance, dysphoric mood, decreased concentration and agitation.   OBJECTIVE: Temp:  [98.2 F (36.8 C)-98.9 F (37.2 C)] 98.6 F (37 C) (04/25 0800)  Pulse Rate:  [71-93] 81 (04/25 1300) Resp:  [15-28] 16 (04/25 1300) SpO2:  [91 %-100 %] 98 % (04/25 1300) Arterial Line BP: (106-164)/(50-65) 120/50 (04/25 1100) Weight:  [82 kg] 82 kg (04/25 0431) Physical Exam  Constitutional:  oriented to person, place, and time. appears well-developed and well-nourished. No distress.  HENT: [place]/AT, PERRLA, no scleral icterus. Wearing nasal cannula. Right HD catheter in place Mouth/Throat: Oropharynx is clear and moist. No oropharyngeal exudate.  Cardiovascular: Normal rate, regular rhythm and normal heart sounds. Exam reveals no gallop and no friction rub.  No murmur heard.  Pulmonary/Chest: Effort normal and breath sounds normal. No respiratory distress.  has no wheezes.  Gastro= rectal tube in place Neck = supple, no nuchal rigidity Abdominal: Soft. Bowel sounds are normal.  exhibits no distension. There is no tenderness.  Lymphadenopathy: no cervical adenopathy. No axillary adenopathy Neurological: alert and oriented to person, place, and time.  Skin: Skin is warm and dry. No rash noted. No erythema.  Psychiatric: a normal mood and affect.  behavior is normal.    LABS: Results for orders placed or performed during the hospital encounter of 06/27/19 (from the past 48 hour(s))  Renal function panel (daily at 1600)     Status: Abnormal   Collection Time: 06/30/19  3:29 PM  Result Value Ref Range   Sodium 137 135 - 145 mmol/L   Potassium 3.7 3.5 - 5.1 mmol/L   Chloride 105 98 - 111 mmol/L   CO2 24 22 - 32 mmol/L   Glucose, Bld 104 (H) 70 - 99 mg/dL    Comment: Glucose reference range applies only to samples taken after fasting for at least 8 hours.   BUN 35 (H) 8 - 23 mg/dL   Creatinine, Ser 1.74 (H) 0.44 - 1.00 mg/dL   Calcium 7.3 (L) 8.9 - 10.3 mg/dL   Phosphorus 1.8 (L) 2.5 - 4.6 mg/dL   Albumin 2.3 (L) 3.5 - 5.0 g/dL   GFR calc non Af Amer 30 (L) >60 mL/min   GFR calc Af Amer 35 (L) >60 mL/min   Anion gap 8 5 - 15    Comment: Performed  at [facility], [place] [address], [place], [place] [address]  Glucose, capillary     Status: Abnormal   Collection Time: 06/30/19  3:53 PM  Result Value Ref Range   Glucose-Capillary 102 (H) 70 - 99 mg/dL    Comment: Glucose reference range applies only to samples taken after fasting for at least 8 hours.  Heparin level (unfractionated)     Status: None   Collection Time: 06/30/19  8:36 PM  Result Value Ref Range   Heparin Unfractionated 0.34 0.30 - 0.70 IU/mL    Comment: (NOTE) If heparin results are below expected values, and patient dosage has  been confirmed, suggest follow up testing of antithrombin III levels. Performed at [facility], [place] [address] [place], [place] [address]   Glucose, capillary     Status: None  Collection Time: 06/30/19  8:41 PM  Result Value Ref Range   Glucose-Capillary 98 70 - 99 mg/dL    Comment: Glucose reference range applies only to samples taken after fasting for at least 8 hours.  Glucose, capillary     Status: Abnormal   Collection Time: 06/30/19 11:16 PM  Result Value Ref Range   Glucose-Capillary 111 (H) 70 - 99 mg/dL    Comment: Glucose reference range applies only to samples taken after fasting for at least 8 hours.  Glucose, capillary     Status: Abnormal   Collection Time: 07/01/19  3:14 AM  Result Value Ref Range   Glucose-Capillary 117 (H) 70 - 99 mg/dL    Comment: Glucose reference range applies only to samples taken after fasting for at least 8 hours.  CBC with Differential/Platelet     Status: Abnormal   Collection Time: 07/01/19  4:19 AM  Result Value Ref Range   WBC 63.6 (HH) 4.0 - 10.5 K/uL    Comment: CRITICAL VALUE NOTED.  VALUE IS CONSISTENT WITH PREVIOUSLY REPORTED AND CALLED VALUE. REPEATED TO VERIFY WHITE COUNT CONFIRMED ON SMEAR    RBC 3.16 (L) 3.87 - 5.11 MIL/uL   Hemoglobin 10.0 (L) 12.0 - 15.0 g/dL   HCT 29.4 (L) 36.0 - 46.0 %   MCV 93.0 80.0 - 100.0 fL   MCH 31.6 26.0 -  34.0 pg   MCHC 34.0 30.0 - 36.0 g/dL   RDW 14.8 11.5 - 15.5 %   Platelets 39 (L) 150 - 400 K/uL    Comment: Immature Platelet Fraction may be clinically indicated, consider ordering this additional test [id number]    nRBC 0.6 (H) 0.0 - 0.2 %   Neutrophils Relative % 72 %   Neutro Abs 45.8 (H) 1.7 - 7.7 K/uL   Lymphocytes Relative 7 %   Lymphs Abs 4.3 (H) 0.7 - 4.0 K/uL   Monocytes Relative 5 %   Monocytes Absolute 3.3 (H) 0.1 - 1.0 K/uL   Eosinophils Relative 0 %   Eosinophils Absolute 0.2 0.0 - 0.5 K/uL   Basophils Relative 0 %   Basophils Absolute 0.0 0.0 - 0.1 K/uL   WBC Morphology MILD LEFT SHIFT (1-5% METAS, OCC MYELO, OCC BANDS)    Immature Granulocytes 16 %   Abs Immature Granulocytes 9.98 (H) 0.00 - 0.07 K/uL   Reactive, Benign Lymphocytes PRESENT     Comment: Performed at [facility], [place] [address] [place], [place] [address]  Magnesium     Status: Abnormal   Collection Time: 07/01/19  4:19 AM  Result Value Ref Range   Magnesium 2.5 (H) 1.7 - 2.4 mg/dL    Comment: Performed at [facility], [place] [address] [place], [place] [address]  D-dimer, quantitative (not at [facility])     Status: Abnormal   Collection Time: 07/01/19  4:19 AM  Result Value Ref Range   D-Dimer, Quant 19.97 (H) 0.00 - 0.50 ug/mL-FEU    Comment: (NOTE) At the manufacturer cut-off of 0.50 ug/mL FEU, this assay has been documented to exclude PE with a sensitivity and negative predictive value of 97 to 99%.  At this time, this assay has not been approved by the FDA to exclude DVT/VTE. Results should be correlated with clinical presentation. Performed at [facility], [place] [address], [place], [place] [address]   Renal function panel (daily at 0500)     Status: Abnormal   Collection Time: 07/01/19  4:19 AM  Result Value  Ref Range   Sodium 135 135 - 145 mmol/L   Potassium 4.2 3.5 - 5.1 mmol/L   Chloride 100 98 - 111 mmol/L   CO2 26 22 - 32  mmol/L   Glucose, Bld 120 (H) 70 - 99 mg/dL    Comment: Glucose reference range applies only to samples taken after fasting for at least 8 hours.   BUN 31 (H) 8 - 23 mg/dL   Creatinine, Ser 1.51 (H) 0.44 - 1.00 mg/dL   Calcium 7.6 (L) 8.9 - 10.3 mg/dL   Phosphorus 2.1 (L) 2.5 - 4.6 mg/dL   Albumin 2.4 (L) 3.5 - 5.0 g/dL   GFR calc non Af Amer 36 (L) >60 mL/min   GFR calc Af Amer 42 (L) >60 mL/min   Anion gap 9 5 - 15    Comment: Performed at [facility], [place] [address], [place], [place] [address]  APTT     Status: Abnormal   Collection Time: 07/01/19  4:19 AM  Result Value Ref Range   aPTT 84 (H) 24 - 36 seconds    Comment:        IF BASELINE aPTT IS ELEVATED, SUGGEST PATIENT RISK ASSESSMENT BE USED TO DETERMINE APPROPRIATE ANTICOAGULANT THERAPY. Performed at [facility], [place] [address] [place], [place] [address]   Hepatic function panel     Status: Abnormal   Collection Time: 07/01/19  4:19 AM  Result Value Ref Range   Total Protein 5.6 (L) 6.5 - 8.1 g/dL   Albumin 2.3 (L) 3.5 - 5.0 g/dL   AST 33 15 - 41 U/L   ALT 37 0 - 44 U/L   Alkaline Phosphatase 180 (H) 38 - 126 U/L   Total Bilirubin 0.6 0.3 - 1.2 mg/dL   Bilirubin, Direct 0.2 0.0 - 0.2 mg/dL   Indirect Bilirubin 0.4 0.3 - 0.9 mg/dL    Comment: Performed at [facility], [place] [address], [place], [place] [address]  Heparin level (unfractionated)     Status: None   Collection Time: 07/01/19  5:00 AM  Result Value Ref Range   Heparin Unfractionated 0.32 0.30 - 0.70 IU/mL    Comment: (NOTE) If heparin results are below expected values, and patient dosage has  been confirmed, suggest follow up testing of antithrombin III levels. Performed at [facility], [place] [address] [place], [place] [address]   Glucose, capillary     Status: Abnormal   Collection Time: 07/01/19  8:20 AM  Result Value Ref Range   Glucose-Capillary 101 (H) 70 - 99 mg/dL     Comment: Glucose reference range applies only to samples taken after fasting for at least 8 hours.  Glucose, capillary     Status: None   Collection Time: 07/01/19 12:14 PM  Result Value Ref Range   Glucose-Capillary 99 70 - 99 mg/dL    Comment: Glucose reference range applies only to samples taken after fasting for at least 8 hours.  Renal function panel (daily at 1600)     Status: Abnormal   Collection Time: 07/01/19  4:18 PM  Result Value Ref Range   Sodium 135 135 - 145 mmol/L   Potassium 3.8 3.5 - 5.1 mmol/L   Chloride 102 98 - 111 mmol/L   CO2 26 22 - 32 mmol/L   Glucose, Bld 101 (H) 70 - 99 mg/dL    Comment: Glucose reference range applies only to samples taken after fasting for at least 8 hours.  BUN 29 (H) 8 - 23 mg/dL   Creatinine, Ser 1.25 (H) 0.44 - 1.00 mg/dL   Calcium 7.9 (L) 8.9 - 10.3 mg/dL   Phosphorus 1.2 (L) 2.5 - 4.6 mg/dL   Albumin 2.3 (L) 3.5 - 5.0 g/dL   GFR calc non Af Amer 45 (L) >60 mL/min   GFR calc Af Amer 53 (L) >60 mL/min   Anion gap 7 5 - 15    Comment: Performed at [facility], [place] [address] [place], [place] [address]  Glucose, capillary     Status: None   Collection Time: 07/01/19  4:18 PM  Result Value Ref Range   Glucose-Capillary 99 70 - 99 mg/dL    Comment: Glucose reference range applies only to samples taken after fasting for at least 8 hours.  Glucose, capillary     Status: None   Collection Time: 07/01/19  8:23 PM  Result Value Ref Range   Glucose-Capillary 71 70 - 99 mg/dL    Comment: Glucose reference range applies only to samples taken after fasting for at least 8 hours.  Glucose, capillary     Status: None   Collection Time: 07/01/19 11:53 PM  Result Value Ref Range   Glucose-Capillary 70 70 - 99 mg/dL    Comment: Glucose reference range applies only to samples taken after fasting for at least 8 hours.  Magnesium     Status: Abnormal   Collection Time: 07/02/19  4:00 AM  Result Value Ref Range   Magnesium  2.5 (H) 1.7 - 2.4 mg/dL    Comment: Performed at [facility], [place] [address] [place], [place] [address]  D-dimer, quantitative (not at [facility])     Status: Abnormal   Collection Time: 07/02/19  4:00 AM  Result Value Ref Range   D-Dimer, Quant 4.84 (H) 0.00 - 0.50 ug/mL-FEU    Comment: (NOTE) At the manufacturer cut-off of 0.50 ug/mL FEU, this assay has been documented to exclude PE with a sensitivity and negative predictive value of 97 to 99%.  At this time, this assay has not been approved by the FDA to exclude DVT/VTE. Results should be correlated with clinical presentation. Performed at [facility], [place] [address] [place], [place] [address]   Renal function panel (daily at 0500)     Status: Abnormal   Collection Time: 07/02/19  4:00 AM  Result Value Ref Range   Sodium 135 135 - 145 mmol/L   Potassium 4.2 3.5 - 5.1 mmol/L   Chloride 102 98 - 111 mmol/L   CO2 26 22 - 32 mmol/L   Glucose, Bld 132 (H) 70 - 99 mg/dL    Comment: Glucose reference range applies only to samples taken after fasting for at least 8 hours.   BUN 30 (H) 8 - 23 mg/dL   Creatinine, Ser 1.27 (H) 0.44 - 1.00 mg/dL   Calcium 7.9 (L) 8.9 - 10.3 mg/dL   Phosphorus 1.3 (L) 2.5 - 4.6 mg/dL   Albumin 2.2 (L) 3.5 - 5.0 g/dL   GFR calc non Af Amer 45 (L) >60 mL/min   GFR calc Af Amer 52 (L) >60 mL/min   Anion gap 7 5 - 15    Comment: Performed at [facility], [place] [address], [place], [place] [address]  APTT     Status: Abnormal   Collection Time: 07/02/19  4:00 AM  Result Value Ref Range   aPTT 60 (H) 24 - 36 seconds  Comment:        IF BASELINE aPTT IS ELEVATED, SUGGEST PATIENT RISK ASSESSMENT BE USED TO DETERMINE APPROPRIATE ANTICOAGULANT THERAPY. Performed at [facility], [place] [address], [place], [place] [address]   Hepatic function panel     Status: Abnormal   Collection Time: 07/02/19  4:00 AM  Result Value Ref Range    Total Protein 5.9 (L) 6.5 - 8.1 g/dL   Albumin 2.2 (L) 3.5 - 5.0 g/dL   AST 36 15 - 41 U/L   ALT 42 0 - 44 U/L   Alkaline Phosphatase 235 (H) 38 - 126 U/L   Total Bilirubin 0.7 0.3 - 1.2 mg/dL   Bilirubin, Direct 0.1 0.0 - 0.2 mg/dL   Indirect Bilirubin 0.6 0.3 - 0.9 mg/dL    Comment: Performed at [facility], [place] [address], [place], [place] [address]  Heparin level (unfractionated)     Status: None   Collection Time: 07/02/19  4:00 AM  Result Value Ref Range   Heparin Unfractionated 0.31 0.30 - 0.70 IU/mL    Comment: (NOTE) If heparin results are below expected values, and patient dosage has  been confirmed, suggest follow up testing of antithrombin III levels. Performed at [facility], [place] [address] [place], [place] [address]   CBC with Differential/Platelet     Status: Abnormal   Collection Time: 07/02/19  4:00 AM  Result Value Ref Range   WBC 90.8 (HH) 4.0 - 10.5 K/uL    Comment: CRITICAL VALUE NOTED.  VALUE IS CONSISTENT WITH PREVIOUSLY REPORTED AND CALLED VALUE. REPEATED TO VERIFY WHITE COUNT CONFIRMED ON SMEAR    RBC 3.67 (L) 3.87 - 5.11 MIL/uL   Hemoglobin 11.1 (L) 12.0 - 15.0 g/dL   HCT 33.8 (L) 36.0 - 46.0 %   MCV 92.1 80.0 - 100.0 fL   MCH 30.2 26.0 - 34.0 pg   MCHC 32.8 30.0 - 36.0 g/dL   RDW 14.8 11.5 - 15.5 %   Platelets 58 (L) 150 - 400 K/uL    Comment: CONSISTENT WITH PREVIOUS RESULT Immature Platelet Fraction may be clinically indicated, consider ordering this additional test [id number] REPEATED TO VERIFY    nRBC 1.4 (H) 0.0 - 0.2 %   Neutrophils Relative % 80 %   Lymphocytes Relative 6 %   Monocytes Relative 4 %   Eosinophils Relative 0 %   Basophils Relative 1 %   Band Neutrophils 2 %   Metamyelocytes Relative 0 %   Myelocytes 4 %   Promyelocytes Relative 3 %   Blasts 0 %   nRBC 2 (H) 0 /100 WBC   Other 0 %   Neutro Abs 74.5 (H) 1.7 - 7.7 K/uL   Lymphs Abs 5.4 (H) 0.7 - 4.0 K/uL   Monocytes Absolute  3.6 (H) 0.1 - 1.0 K/uL   Eosinophils Absolute 0.0 0.0 - 0.5 K/uL   Basophils Absolute 0.9 (H) 0.0 - 0.1 K/uL   Abs Immature Granulocytes 6.36 (H) 0.00 - 0.07 K/uL   RBC Morphology RARE NRBCs    WBC Morphology      MODERATE LEFT SHIFT (>5% METAS AND MYELOS,OCC PRO NOTED)   Dohle Bodies PRESENT     Comment: Performed at [facility], [place] [address], [place], [place] [address]  Glucose, capillary     Status: Abnormal   Collection Time: 07/02/19  4:11 AM  Result Value Ref Range   Glucose-Capillary 132 (H) 70 - 99 mg/dL    Comment:  Glucose reference range applies only to samples taken after fasting for at least 8 hours.  Glucose, capillary     Status: Abnormal   Collection Time: 07/02/19  8:11 AM  Result Value Ref Range   Glucose-Capillary 119 (H) 70 - 99 mg/dL    Comment: Glucose reference range applies only to samples taken after fasting for at least 8 hours.  Glucose, capillary     Status: None   Collection Time: 07/02/19 11:24 AM  Result Value Ref Range   Glucose-Capillary 90 70 - 99 mg/dL    Comment: Glucose reference range applies only to samples taken after fasting for at least 8 hours.    MICRO: 4/20 blood cx -e.coli (pan sensitive) 4/25 blood cx pending IMAGING: CT ABDOMEN PELVIS WO CONTRAST  Result Date: 07/02/2019 CLINICAL DATA:  Abdominal pain, acute with increased white blood cell count. EXAM: CT ABDOMEN AND PELVIS WITHOUT CONTRAST TECHNIQUE: Multidetector CT imaging of the abdomen and pelvis was performed following the standard protocol without IV contrast. COMPARISON:  06/27/2019 FINDINGS: Lower chest: Enlargement of bilateral pleural effusions. Areas of patchy airspace and interstitial opacities abnormality since the previous exam in areas of aerated lung. Moderate basilar volume loss bilaterally. Hepatobiliary: Large gallstone in the gallbladder neck. Gallbladder is less distended than on the prior study. Trace pericholecystic fluid. No pericholecystic  stranding. Liver is lobular without focal lesion on noncontrast imaging. No gross biliary ductal dilation. Pancreas: Pancreas is normal. No signs of inflammation, ductal dilation or lesion. Spleen: Spleen is normal size. No focal lesion. Adrenals/Urinary Tract: Adrenal glands are normal. Nephrolithiasis in the RIGHT kidney with similar appearance to the prior study. LEFT-sided nephrostomy tube remains in place. Moderate sized calculus that was present in the posterior LEFT interpolar calyx on the prior study measuring 4-5 mm has migrated into the renal pelvis near the UPJ just proximal to the dominant calculus. Stomach/Bowel: Moderate size hiatal hernia. Gastric tube in situ, tube terminates in the duodenal bulb, tenting the duodenal bulb. No perigastric stranding. Colonic diverticulosis. Normal appendix. Vascular/Lymphatic: Calcified atheromatous plaque in the abdominal aorta. No aneurysm. No adenopathy in the retroperitoneum or in the upper abdomen. No adenopathy in the pelvis. Reproductive: Unremarkable by CT. Other: Mild body wall edema. Musculoskeletal: No acute bone finding or destructive bone process. Levoconvex curvature of the lumbar spine with similar appearance associated with degenerative change. IMPRESSION: 1. Enlargement of bilateral pleural effusions and patchy airspace and interstitial opacities since the previous study in areas of aerated lung. Findings could represent pneumonitis/infection, correlate with respiratory symptoms. 2. LEFT-sided nephrostomy tube remains in place. Moderate sized calculus that was present in the posterior LEFT interpolar calyx on the prior study measuring 4-5 mm has migrated into the renal pelvis near the UPJ just proximal to the dominant calculus. No hydronephrosis. 3. Large gallstone in the gallbladder neck. Gallbladder is less distended than on the prior study. Trace pericholecystic fluid. No pericholecystic stranding. Correlate with any symptoms or laboratory values  that would suggest biliary pathology with HIDA scan as warranted. 4. Gastric tube in situ, tube terminates in the duodenal bulb, tenting the duodenal bulb. Suggest retraction into the stomach, approximately 5-6 cm retraction. 5. Colonic diverticulosis without evidence of acute diverticulitis. 6. Aortic atherosclerosis. These results will be called to the ordering clinician or representative by the Radiologist Assistant, and communication documented in the PACS or [employer]. Aortic Atherosclerosis (ICD10-I70.0). Electronically Signed   By: Zetta Bills M.D.   On: 07/02/2019 11:39   DG Chest [facility]  Result Date: 07/02/2019 CLINICAL DATA:  64 year old female with history of respiratory failure. EXAM: PORTABLE CHEST 1 VIEW COMPARISON:  Chest x-ray 07/01/2019. FINDINGS: There is a right-sided internal jugular central venous catheter with tip terminating in the mid superior vena cava. A nasogastric tube is seen extending into the stomach, however, the tip of the nasogastric tube extends below the lower margin of the image. Lung volumes are normal. There continues to be patchy ill-defined areas of airspace consolidation and interstitial prominence scattered throughout the lungs bilaterally, but overall aeration has significantly improved compared to yesterday's examination. No definite pleural effusions. Heart size is normal. Upper mediastinal contours are within normal limits. Aortic atherosclerosis. IMPRESSION: 1. Support apparatus, as above. 2. Improving aeration throughout the lungs bilaterally which could reflect resolving edema and/or multilobar pneumonia. 3. Aortic atherosclerosis. Electronically Signed   By: Vinnie Langton M.D.   On: 07/02/2019 06:49   DG CHEST PORT 1 VIEW  Result Date: 07/01/2019 CLINICAL DATA:  COVID-19 positive.  Shortness of breath. EXAM: PORTABLE CHEST 1 VIEW COMPARISON:  June 29, 2019 FINDINGS: The right central line terminates in the central SVC, advanced in the  interval. The NG tube terminates below today's film. No pneumothorax. Bilateral pulmonary infiltrates have worsened in the interval, particularly on the left. No change in the cardiomediastinal silhouette. IMPRESSION: 1. Support apparatus as above. 2. Worsening pulmonary infiltrates. Electronically Signed   By: Dorise Bullion III M.D   On: 07/01/2019 13:12   ECHOCARDIOGRAM COMPLETE  Result Date: 06/30/2019    ECHOCARDIOGRAM REPORT   Patient Name:   Nichoel ANN Tayloe Date of Exam: 06/30/2019 Medical Rec #:  [id number]      Height:       64.0 in Accession #:    [id number]     Weight:       187.4 lb Date of Birth:  1955/11/26     BSA:          1.903 m Patient Age:    26 years       BP:           143/65 mmHg Patient Gender: F              HR:           63 bpm. Exam Location:  Inpatient Procedure: 2D Echo Indications:    Dyspnea                 ; R06.9 DOE  History:        Patient has no prior history of Echocardiogram examinations.                 Signs/Symptoms:Dyspnea.  Sonographer:    Raquel Sarna Senior Referring Phys: [place]  1. Diffuse hypokinesis with abnormal septal motion . Left ventricular ejection fraction, by estimation, is 40 to 45%. The left ventricle has mildly decreased function. The left ventricle demonstrates global hypokinesis. Left ventricular diastolic parameters were normal.  2. Right ventricular systolic function is normal. The right ventricular size is normal. There is mildly elevated pulmonary artery systolic pressure.  3. Cannot r/o PFO.  4. Right atrial size was mildly dilated.  5. The mitral valve is normal in structure. Mild mitral valve regurgitation. No evidence of mitral stenosis.  6. Tricuspid valve regurgitation is moderate.  7. The aortic valve is tricuspid. Aortic valve regurgitation is not visualized. No aortic stenosis is present.  8. The inferior vena cava is dilated in size with <50% respiratory variability, suggesting  right atrial pressure of 15 mmHg.  FINDINGS  Left Ventricle: Diffuse hypokinesis with abnormal septal motion. Left ventricular ejection fraction, by estimation, is 40 to 45%. The left ventricle has mildly decreased function. The left ventricle demonstrates global hypokinesis. The left ventricular internal cavity size was normal in size. There is no left ventricular hypertrophy. Left ventricular diastolic parameters were normal. Right Ventricle: The right ventricular size is normal. No increase in right ventricular wall thickness. Right ventricular systolic function is normal. There is mildly elevated pulmonary artery systolic pressure. The tricuspid regurgitant velocity is 2.73  m/s, and with an assumed right atrial pressure of 10 mmHg, the estimated right ventricular systolic pressure is [id number]. Left Atrium: Left atrial size was normal in size. Right Atrium: Right atrial size was mildly dilated. Pericardium: There is no evidence of pericardial effusion. Mitral Valve: The mitral valve is normal in structure. There is mild thickening of the mitral valve leaflet(s). There is mild calcification of the mitral valve leaflet(s). Normal mobility of the mitral valve leaflets. Mild mitral valve regurgitation. No evidence of mitral valve stenosis. Tricuspid Valve: The tricuspid valve is normal in structure. Tricuspid valve regurgitation is moderate . No evidence of tricuspid stenosis. Aortic Valve: The aortic valve is tricuspid. Aortic valve regurgitation is not visualized. No aortic stenosis is present. Pulmonic Valve: The pulmonic valve was normal in structure. Pulmonic valve regurgitation is mild. No evidence of pulmonic stenosis. Aorta: The aortic root is normal in size and structure. Venous: The inferior vena cava is dilated in size with less than 50% respiratory variability, suggesting right atrial pressure of 15 mmHg. IAS/Shunts: The interatrial septum was not well visualized.  LEFT VENTRICLE PLAX 2D LVIDd:         4.64 cm     Diastology LVIDs:          3.40 cm     LV e' lateral:   6.85 cm/s LV PW:         0.86 cm     LV E/e' lateral: 9.6 LV IVS:        0.78 cm     LV e' medial:    6.42 cm/s LVOT diam:     2.00 cm     LV E/e' medial:  10.3 LV SV:         46 LV SV Index:   24 LVOT Area:     3.14 cm  LV Volumes (MOD) LV vol d, MOD A4C: 80.7 ml LV vol s, MOD A4C: 43.0 ml LV SV MOD A4C:     80.7 ml RIGHT VENTRICLE RV S prime:     7.94 cm/s TAPSE (M-mode): 2.1 cm LEFT ATRIUM             Index       RIGHT ATRIUM           Index LA diam:        3.70 cm 1.94 cm/m  RA Area:     19.70 cm LA Vol (A2C):   47.7 ml 25.07 ml/m RA Volume:   58.10 ml  30.53 ml/m LA Vol (A4C):   48.7 ml 25.59 ml/m LA Biplane Vol: 49.9 ml 26.22 ml/m  AORTIC VALVE LVOT Vmax:   65.00 cm/s LVOT Vmean:  42.900 cm/s LVOT VTI:    0.148 m  AORTA Ao Root diam: 3.00 cm Ao Asc diam:  3.30 cm MITRAL VALVE               TRICUSPID VALVE MV Area (  PHT): 3.99 cm    TR Peak grad:   29.8 mmHg MV Decel Time: 190 msec    TR Vmax:        273.00 cm/s MV E velocity: 66.00 cm/s MV A velocity: 50.40 cm/s  SHUNTS MV E/A ratio:  1.31        Systemic VTI:  0.15 m                            Systemic Diam: 2.00 cm Jenkins Rouge MD Electronically signed by Jenkins Rouge MD Signature Date/Time: 06/30/2019/5:41:08 PM    Final     HISTORICAL MICRO/IMAGING  Assessment/Plan:   64yo F admitted with sepsis, with major complaint of left sided flank pain and decrease urine output. But also she was found to have early covid pneumonia-. Work up shows that she had left obstructive uropahty, and hydroureteronephrosis, and bilateral non obstructing renal calculi, worsening acidosis due to her AKI requiring CRRT. Infectious work up shows ecoli bacteremia but also having perfuse diarrhea starting on evening of 4/22, and WBC on admit of 20 steadily climbing to 90K - recommend to test for cdifficile - would narrow abtx to ampicillin instead of ceftriaxone to treat ecoli bacteremia likely for urinary source - marked leukocytosis =  would anticipate small increase with dexamethasone but not to this degree. Will follow repeat blood cx to see if any further changes in abtx are needed.  -nephrolithiasis = decompressed with left nephrostomy tube  ---addendum: cdiff negative

## 2019-07-02 NOTE — Progress Notes (Signed)
PHARMACIST - PHYSICIAN COMMUNICATION  DR:   CCM team  CONCERNING: IV to Oral Route Change Policy  RECOMMENDATION: This patient is receiving Protonix & Keppra by the intravenous route.  Based on criteria approved by the Pharmacy and Therapeutics Committee, the intravenous medication(s) is/are being converted to the equivalent oral dose form(s).   DESCRIPTION: These criteria include:  The patient is eating (either orally or via tube) and/or has been taking other orally administered medications for a least 24 hours  The patient has no evidence of active gastrointestinal bleeding or impaired GI absorption (gastrectomy, short bowel, patient on TNA or NPO).  If you have questions about this conversion, please contact the Pharmacy Department  []   [phone] )  [facility] []   [phone] )  [facility] []   [phone] )  [facility] []   [phone] )  [facility] [x]   [phone] )  [facility]   All meds changed to PER TUBE route per discussion with RN.   Netta Cedars, [facility] 07/02/2019 8:45 AM

## 2019-07-02 NOTE — Progress Notes (Signed)
[place] for heparin Indication: Elevated D-dimer with COVID  No Known Allergies  Patient Measurements: Height: 5\' 4"  (162.6 cm) Weight: 82 kg (180 lb 12.4 oz) IBW/kg (Calculated) : 54.7 Heparin Dosing Weight: 70.5 kg  Vital Signs: Temp: 98.9 F (37.2 C) (04/25 0400) Temp Source: Oral (04/25 0400) Pulse Rate: 84 (04/25 0700)  Labs: Recent Labs    06/30/19 0417 06/30/19 0417 06/30/19 0920 06/30/19 1529 06/30/19 2036 07/01/19 0419 07/01/19 0500 07/01/19 1618 07/02/19 0400  HGB 9.9*   < >  --   --   --  10.0*  --   --  11.1*  HCT 28.7*  --   --   --   --  29.4*  --   --  33.8*  PLT 48*   < > 43*  --   --  39*  --   --  58*  APTT 57*   < > 67*  --   --  84*  --   --  60*  LABPROT  --   --  16.0*  --   --   --   --   --   --   INR  --   --  1.3*  --   --   --   --   --   --   HEPARINUNFRC  --   --  0.15*  --  0.34  --  0.32  --  0.31  CREATININE 2.34*   < >  --    < >  --  1.51*  --  1.25* 1.27*   < > = values in this interval not displayed.    Estimated Creatinine Clearance: 46.3 mL/min (A) (by C-G formula based on SCr of 1.27 mg/dL (H)).   Medical History: Past Medical History:  Diagnosis Date  . Colon polyps 2014  . Condyloma   . Depression   . Epilepsy ([place])   . Osteopenia 04/2018   T score -1.8 FRAX 9.5% / 1.1% overall stable from prior DEXA  . Seizures ([place])     Medications:  Medications Prior to Admission  Medication Sig Dispense Refill Last Dose  . aspirin 81 MG tablet Take 81 mg by mouth daily.       . betamethasone valerate ointment (VALISONE) 0.1 % Apply 1 application topically 2 (two) times daily. 30 g 1   . CRANBERRY PO Take 2 tablets by mouth daily.      . felbamate (FELBATOL) 600 MG tablet TAKE 2 TABLETS BY MOUTH 4 TIMES A DAY (Patient taking differently: Take 1,200 mg by mouth 4 (four) times daily. ) 240 tablet 6   . levETIRAcetam (KEPPRA) 500 MG tablet TAKE 1 TABLET BY MOUTH TWICE A DAY (Patient taking  differently: Take 500 mg by mouth 2 (two) times daily. ) 180 tablet 2   . metroNIDAZOLE (METROGEL) 0.75 % vaginal gel Place 1 Applicatorful vaginally 2 (two) times daily. 70 g 0   . Multiple Vitamins-Minerals (ICAPS AREDS FORMULA PO) Take by mouth.     . topiramate (TOPAMAX) 200 MG tablet TAKE 1 TABLET BY MOUTH TWICE A DAY (Patient taking differently: Take 200 mg by mouth 2 (two) times daily. ) 180 tablet 3     Assessment: Pharmacy consulted for heparin drip for "Elevated D-dimer with COVID, would like heparin drip without a bolus please".   Pt currently on SCDs for VTE px due to new onset thrombocytopenia 4/20.   Baseline labs: SCr 4.12, INR elevated  at 1.6, Hg 13.6> 14.2> 10.9, PLTC 280> 89>80>67 D dimer > 20 -Chest CT negative for PE 4/21: s/p percutaneous placement of nephrostomy tube for obstructing renal calculi without any acute complications & 0000000 units heparin given today at 1151 w/ HD catheter placement  07/02/2019: - Heparin therapeutic (0.31) on heparin infusion at 1400 units/hr - CBC: Hg low but improved today; pltc low but improved today.  MD aware of thrombocytopenia & still wants to continue heparin. - No bleeding or infusion related issues noted by nursing  Goal of Therapy:  Heparin level 0.3-0.7 units/ml Monitor platelets by anticoagulation protocol: Yes   Plan:  Continue heparin drip at 1400 units/hr Daily heparin level and CBC while on heparin Monitor closely for s/sx bleeding  Netta Cedars, PharmD, BCPS 07/02/2019 7:52 AM

## 2019-07-02 NOTE — Progress Notes (Signed)
NAME:  Amber Bowman, MRN:  [id number], DOB:  [birth year], LOS: 5 ADMISSION DATE:  06/27/2019, CONSULTATION DATE:  06/27/2019  REFERRING MD:  Dr Varney Baas ER, CHIEF COMPLAINT:  Acute renal failure, shoock, lactic acidosis, Covid-19   Brief History   Presented with CC of shortness of breath coupled with nausea, vomiting, diarrhea, and left flank pain. Found to be COVID positive with urosepsis from pyelonephritis and obstructive uropathy.   History of present illness   64 year old relatively healthy female who has not yet been vaccinated for COVID-19.  Presented on June 26, 2019 with SIRS physiology fever 101.3, heart rate 140 and left-sided flank pain and self-reported dyspnea due to anxiety.  Had a lactic acid of 2.0, mild acute kidney injury with creatinine 1.7 and a normal white count.  She waited in the ER for 4 hours apparently and then left.  However on June 27, 2018 when she returned with nonspecific symptoms found to be hypotensive.  Per ER physician patient has had significant amount of vomiting and nausea and poor p.o. intake.  No NSAID use.  She has required 3 L of fluid and low-dose Levophed through peripheral infusion to obtain a made arterial pressure of 65 and a systolic blood pressure greater than 75.  Pulse ox reported as normal throughout the course of the stay and no respiratory issues but at time of PCCM evaluation pulse ox 94% on 2 L nasal cannula.  Mentating quite well but feeling cold.  White count low-normal.  Chest x-ray fairly clear.  Creatinine 3.13 mg percent and a lactic acid of greater than 4 mg percent.  Critical care medicine asked admit the patient.  Bedside ultrasound showed evidence of gallstones and mild biliary duct dilatation.  CC is Dr. Barry Dienes has been called by ER.  Liver function test normal other than elevated alkaline phosphatase 200.  Urine analysis on June 26, 2019: Red cells greater than 50 and white cells greater than 11 with positive nitrates  and leukocytes.  Suggestive of UTI  No significant Covid history other than age greater than 65.  She is Covid positive on testing in the ER  Past medical history positive for epilepsy/seizures on Felbatol, topiramate and Keppra  Past Medical History    has a past medical history of Colon polyps (2014), Condyloma, Depression, Epilepsy ([place]), Osteopenia (04/2018), and Seizures ([place]).   reports that she has never smoked. She has never used smokeless tobacco.  Significant Hospital Events   4/20 Admit 4/21 percutaneous placement of nephrostomy tube   Consults:  Nephrology 4/21 Urology 4/21 IR 4/21  Procedures:  4/20 Arterial line planned 4/21 left nephrostomy tube placed bu IR   Significant Diagnostic Tests:  CT chest/abdomen/pelvis non contrast 4/20 > 1. Left-sided obstructive uropathy with 10 mm stone in the proximal left ureter causing mild hydroureteronephrosis and perinephric stranding. 2. Multiple bilateral nonobstructing renal calculi measuring up to 6 mm. 3. Small pleural effusions with basilar atelectasis. 4. Cholelithiasis with hydropic gallbladder. No evidence of acute inflammation. 5. Aortic Atherosclerosis (ICD10-I70.0).  Chest X-ray 4/20 > Central venous congestion.  US Aorta 4/20 >  1.  No abdominal aortic aneurysm identified. 2. Absence of aortic aneurysm by ultrasound does not exclude aortic dissection, intramural hematoma, or other acute aortic pathology. Consider CT angiogram to further evaluate if there is high clinical concern for aortic pathology in the setting of acute pain.  US Abdomen 4/20 >  Cholelithiasis with mild extrahepatic biliary duct distension. Correlation with liver function test  recommended.  Gallbladder wall thickness upper normal without pericholecystic fluid or sonographic Murphy sign.  Micro Data:  4/20 COVID-19 > Positive 4/20 Urine culture > negative  4/20 Blood culture > positive for Enterobacter and E.Coli  pan-sensitive  4/20 Urine streptococcal > negative  4/20 Urine Legionella >  Antimicrobials:  4/20 empiric ceftriaxone  COVID Rx Decadron 4/20 x 10 days (post cortisol) Remdesivir 4/20 x 5 days Toci (not giving pending sepsis evaluation)  Interim history/subjective:  Alert and interactive Complaining of pain and discomfort fingertips and toes -better today than previously Complain of soreness of her lips  Objective   Blood pressure (!) 150/65, pulse 85, temperature 98.6 F (37 C), temperature source Oral, resp. rate 20, height 5\' 4"  (1.626 m), weight 82 kg, last menstrual period 02/11/2008, SpO2 96 %.        Intake/Output Summary (Last 24 hours) at 07/02/2019 0954 Last data filed at 07/02/2019 [id number] Gross per 24 hour  Intake 2874.9 ml  Output 8310 ml  Net -5435.1 ml   Filed Weights   06/30/19 0500 07/01/19 0500 07/02/19 0431  Weight: 85 kg 82.7 kg 82 kg    Examination: General: Acutely ill-appearing, does not appear to be in respiratory distress HEENT: Moist oral mucosa Neuro: Alert and oriented x3 CV: S1-S2 appreciated, no murmur PULM: Decreased breath sounds bilaterally  GI: Bowel sounds appreciated  extremities: warm/dry, non-pitting edema  Skin: Toes and fingertips appear dusky  Resolved Hospital Problem list     Assessment & Plan:  Severe septic shock with E.Coli bacteremia  -E. coli bacteremia -Likely secondary to pyelonephritis -Continue supplemental oxygen  -Continue antibiotics -On CRRT -Continue close monitoring  Mild acute hypoxemic respiratory failure in the setting of sepsis and COVID-19  Pulmonary edema  -COVID positive 4/20, no acute respiratory complaints currently  -CT chest with small pleural effusions with basilar atelectasis. -Continue Decadron and remdesivir -Fluid being pulled via CRRT  UTI Obstructing left kidney stone with hydroureteronephrosis  -Seen on CT scan 4/20, S/P nephrostomy tube placed by IR on 4/21. Given worsening  leukocytosis and continued need of pressor need to rule out paranephritic abscess  Acute kidney injury  CRRT started 4/22 Appreciate renal assistance with management Avoid nephrotoxic's Continue antibiotics Nephrostomy tube care  Hypophosphatemia -Will replace-sodium phosphate ordered  History of seizures on topiramate Keppra and Felbatol  -Continue Keppra IV -NG tube placed for administration of Topamaex and Felbamate -Seizure precautions   Gallstones with mild biliary dilatation seen on admit ultrasound 06/27/2019 -CT abdomen with Cholelithiasis with hydropic gallbladder but no acute inflammation  -Lipase and amylase WNL -Surgery consulted on admission with no acute indications of surgical interventions -Will need follow up in the outpatient setting   At risk for DVT Elevated D-dimer in the setting of COVID  Thrombocytopenia  P: Continue heparin drip Close monitoring of thrombocytopenia -stable at present Close monitoring for bleeding  Trend CBC    Hypoglycemia  P: Improved with TF Trail off Dextrose drip   Concern with worsening leukocytosis -Repeat blood cultures -Obtain abdominal CT without contrast -Consult infectious disease  Best practice:  Diet: TF  Pain/Anxiety/Delirium protocol (if indicated): PRNs VAP protocol (if indicated):N/A DVT prophylaxis: On heparin GI prophylaxis: Protonix Glucose control: SSI Mobility: Bedrest Code Status: Full code Family Communication: Will update Disposition: ICU  The patient is critically ill with multiple organ systems failure and requires high complexity decision making for assessment and support, frequent evaluation and titration of therapies, application of advanced monitoring technologies and extensive interpretation of  multiple databases. Critical Care Time devoted to patient care services described in this note independent of APP/resident time (if applicable)  is 40 minutes.   Sherrilyn Rist MD [facility]  Critical Care Personal pager: [phone] If unanswered, please page CCM On-call: [phone]

## 2019-07-02 NOTE — Progress Notes (Signed)
Complains about her lips burning  She does have ulcerated areas She has never had fever blisters  Tongue appears spared from process  Only new medication is gabapentin We will hold gabapentin for now  We will monitor closely

## 2019-07-02 NOTE — Progress Notes (Signed)
CRRT stopped and blood returned to change set, will take pt for CT scan and restart

## 2019-07-02 NOTE — Progress Notes (Addendum)
NG pulled back  5cm per order, will get KUB to verify

## 2019-07-03 ENCOUNTER — Encounter (HOSPITAL_COMMUNITY): Payer: Self-pay | Admitting: Internal Medicine

## 2019-07-03 DIAGNOSIS — R7881 Bacteremia: Secondary | ICD-10-CM | POA: Diagnosis not present

## 2019-07-03 DIAGNOSIS — B9620 Unspecified Escherichia coli [E. coli] as the cause of diseases classified elsewhere: Secondary | ICD-10-CM | POA: Diagnosis not present

## 2019-07-03 DIAGNOSIS — N179 Acute kidney failure, unspecified: Secondary | ICD-10-CM | POA: Diagnosis not present

## 2019-07-03 LAB — CBC WITH DIFFERENTIAL/PLATELET
Abs Immature Granulocytes: 20.19 10*3/uL — ABNORMAL HIGH (ref 0.00–0.07)
Basophils Absolute: 0 10*3/uL (ref 0.0–0.1)
Basophils Relative: 0 %
Eosinophils Absolute: 0.3 10*3/uL (ref 0.0–0.5)
Eosinophils Relative: 0 %
HCT: 34.8 % — ABNORMAL LOW (ref 36.0–46.0)
Hemoglobin: 11.9 g/dL — ABNORMAL LOW (ref 12.0–15.0)
Immature Granulocytes: 20 %
Lymphocytes Relative: 8 %
Lymphs Abs: 7.7 10*3/uL — ABNORMAL HIGH (ref 0.7–4.0)
MCH: 31.2 pg (ref 26.0–34.0)
MCHC: 34.2 g/dL (ref 30.0–36.0)
MCV: 91.3 fL (ref 80.0–100.0)
Monocytes Absolute: 6.5 10*3/uL — ABNORMAL HIGH (ref 0.1–1.0)
Monocytes Relative: 6 %
Neutro Abs: 67.3 10*3/uL — ABNORMAL HIGH (ref 1.7–7.7)
Neutrophils Relative %: 66 %
Platelets: 72 10*3/uL — ABNORMAL LOW (ref 150–400)
RBC: 3.81 MIL/uL — ABNORMAL LOW (ref 3.87–5.11)
RDW: 15.1 % (ref 11.5–15.5)
WBC: 102 10*3/uL (ref 4.0–10.5)
nRBC: 1.5 % — ABNORMAL HIGH (ref 0.0–0.2)

## 2019-07-03 LAB — RENAL FUNCTION PANEL
Albumin: 2.3 g/dL — ABNORMAL LOW (ref 3.5–5.0)
Albumin: 2.3 g/dL — ABNORMAL LOW (ref 3.5–5.0)
Anion gap: 7 (ref 5–15)
Anion gap: 7 (ref 5–15)
BUN: 32 mg/dL — ABNORMAL HIGH (ref 8–23)
BUN: 35 mg/dL — ABNORMAL HIGH (ref 8–23)
CO2: 26 mmol/L (ref 22–32)
CO2: 27 mmol/L (ref 22–32)
Calcium: 8.1 mg/dL — ABNORMAL LOW (ref 8.9–10.3)
Calcium: 7.8 mg/dL — ABNORMAL LOW (ref 8.9–10.3)
Chloride: 101 mmol/L (ref 98–111)
Chloride: 104 mmol/L (ref 98–111)
Creatinine, Ser: 1.37 mg/dL — ABNORMAL HIGH (ref 0.44–1.00)
Creatinine, Ser: 1.45 mg/dL — ABNORMAL HIGH (ref 0.44–1.00)
GFR calc Af Amer: 47 mL/min — ABNORMAL LOW (ref >60)
GFR calc Af Amer: 44 mL/min — ABNORMAL LOW (ref >60)
GFR calc non Af Amer: 41 mL/min — ABNORMAL LOW (ref >60)
GFR calc non Af Amer: 38 mL/min — ABNORMAL LOW (ref >60)
Glucose, Bld: 141 mg/dL — ABNORMAL HIGH (ref 70–99)
Glucose, Bld: 136 mg/dL — ABNORMAL HIGH (ref 70–99)
Phosphorus: 1.5 mg/dL — ABNORMAL LOW (ref 2.5–4.6)
Phosphorus: 1.9 mg/dL — ABNORMAL LOW (ref 2.5–4.6)
Potassium: 4.2 mmol/L (ref 3.5–5.1)
Potassium: 4.3 mmol/L (ref 3.5–5.1)
Sodium: 134 mmol/L — ABNORMAL LOW (ref 135–145)
Sodium: 138 mmol/L (ref 135–145)

## 2019-07-03 LAB — HEPARIN LEVEL (UNFRACTIONATED): Heparin Unfractionated: 0.65 IU/mL (ref 0.30–0.70)

## 2019-07-03 LAB — GLUCOSE, CAPILLARY
Glucose-Capillary: 100 mg/dL — ABNORMAL HIGH (ref 70–99)
Glucose-Capillary: 136 mg/dL — ABNORMAL HIGH (ref 70–99)
Glucose-Capillary: 119 mg/dL — ABNORMAL HIGH (ref 70–99)
Glucose-Capillary: 67 mg/dL — ABNORMAL LOW (ref 70–99)
Glucose-Capillary: 149 mg/dL — ABNORMAL HIGH (ref 70–99)
Glucose-Capillary: 132 mg/dL — ABNORMAL HIGH (ref 70–99)
Glucose-Capillary: 142 mg/dL — ABNORMAL HIGH (ref 70–99)

## 2019-07-03 LAB — CULTURE, BLOOD (SINGLE)
Culture: NO GROWTH
Special Requests: ADEQUATE

## 2019-07-03 LAB — APTT: aPTT: 89 seconds — ABNORMAL HIGH (ref 24–36)

## 2019-07-03 LAB — HEPATIC FUNCTION PANEL
ALT: 34 U/L (ref 0–44)
AST: 25 U/L (ref 15–41)
Albumin: 2.4 g/dL — ABNORMAL LOW (ref 3.5–5.0)
Alkaline Phosphatase: 240 U/L — ABNORMAL HIGH (ref 38–126)
Bilirubin, Direct: 0.1 mg/dL (ref 0.0–0.2)
Indirect Bilirubin: 0.6 mg/dL (ref 0.3–0.9)
Total Bilirubin: 0.7 mg/dL (ref 0.3–1.2)
Total Protein: 6.1 g/dL — ABNORMAL LOW (ref 6.5–8.1)

## 2019-07-03 LAB — PATHOLOGIST SMEAR REVIEW

## 2019-07-03 MED ORDER — DEXTROSE 50 % IV SOLN
INTRAVENOUS | Status: AC
  Administered 2019-07-03: 25 mL
  Filled 2019-07-03: qty 50

## 2019-07-03 MED ORDER — MIDODRINE HCL 5 MG PO TABS
10.0000 mg | ORAL_TABLET | Freq: Three times a day (TID) | ORAL | Status: DC
  Administered 2019-07-03 – 2019-07-09 (×17): 10 mg via ORAL
  Filled 2019-07-03 (×17): qty 2

## 2019-07-03 MED ORDER — GUAIFENESIN-CODEINE 100-10 MG/5ML PO SOLN
10.0000 mL | ORAL | Status: DC | PRN
  Administered 2019-07-03 – 2019-07-05 (×3): 10 mL via ORAL
  Filled 2019-07-03 (×3): qty 10

## 2019-07-03 MED ORDER — HEPARIN BOLUS VIA INFUSION (CRRT)
1000.0000 [IU] | INTRAVENOUS | Status: DC | PRN
  Administered 2019-07-04 (×3): 1000 [IU] via INTRAVENOUS_CENTRAL
  Filled 2019-07-03 (×2): qty 1000

## 2019-07-03 MED ORDER — MIDODRINE HCL 5 MG PO TABS: 10.0000 mg | ORAL_TABLET | Freq: Three times a day (TID) | ORAL | Status: DC

## 2019-07-03 MED ORDER — SODIUM CHLORIDE 0.9 % IV SOLN
250.0000 [IU]/h | INTRAVENOUS | Status: DC
  Administered 2019-07-03: 23:00:00 250 [IU]/h via INTRAVENOUS_CENTRAL
  Administered 2019-07-04 – 2019-07-05 (×2): 1050 [IU]/h via INTRAVENOUS_CENTRAL
  Filled 2019-07-03 (×4): qty 2

## 2019-07-03 MED ORDER — SODIUM CHLORIDE 0.9 % IV SOLN
2.0000 g | Freq: Three times a day (TID) | INTRAVENOUS | Status: DC
  Filled 2019-07-03: qty 2000

## 2019-07-03 MED ORDER — SODIUM CHLORIDE 0.9 % IV SOLN
2.0000 g | Freq: Three times a day (TID) | INTRAVENOUS | Status: DC
  Administered 2019-07-03 – 2019-07-05 (×6): 2 g via INTRAVENOUS
  Filled 2019-07-03: qty 2000
  Filled 2019-07-03 (×2): qty 2
  Filled 2019-07-03: qty 2000
  Filled 2019-07-03 (×3): qty 2

## 2019-07-03 NOTE — Progress Notes (Signed)
PHARMACY NOTE:  ANTIMICROBIAL RENAL DOSAGE ADJUSTMENT  Current antimicrobial regimen includes a mismatch between antimicrobial dosage and estimated renal function.  As per policy approved by the Pharmacy & Therapeutics and Medical Executive Committees, the antimicrobial dosage will be adjusted accordingly.  Current antimicrobial dosage:  Ampicillin 1g IV q6h  Indication: E.coli bacteremia  Renal Function: AKI from ATN in the setting of sepsis  Estimated Creatinine Clearance: 43 mL/min (A) (by C-G formula based on SCr of 1.37 mg/dL (H)). []      On intermittent HD, scheduled: [x]      On CRRT    Antimicrobial dosage has been changed to:  2g x 1, then 2g q8h   Additional comments:   Thank you for allowing pharmacy to be a part of this patient's care.  Peggyann Juba, PharmD, BCPS Pharmacy: [phone] 07/03/2019 8:54 AM

## 2019-07-03 NOTE — Progress Notes (Signed)
NAME:  Amber Bowman, MRN:  [id number], DOB:  1956-01-26, LOS: 6 ADMISSION DATE:  06/27/2019, CONSULTATION DATE:  06/27/2019  REFERRING MD:  Dr Varney Baas ER, CHIEF COMPLAINT:  Acute renal failure, shoock, lactic acidosis, Covid-19   Brief History   64 yo female presented with  Fever 101.3, dyspnea, nausea with vomiting, diarrhea and Lt flank pain.  Found to have obstructive uropathy from stone with Lt pyelonephritis and COVID 19 positive.  Past Medical History  Colon polyps, Depression, Epilepsy, Osteopenia  Significant Hospital Events   4/20 Admit 4/21 percutaneous placement of nephrostomy tube by IR 4/22 start CRRT 4/23 off pressors  Consults:   Surgery gallstones 4/20  Nephrology AKI 4/21  Urology obstructive uropathy 4/21  ID diarrhea/leukocytosis 4/25   Hematology leukocytosis 4/26  Procedures:  Rt IJ HD cath 4/21 >>   Significant Diagnostic Tests:   RUQ abd u/s 4/20 >> cholelithiasis with mild CBD distention, negative Murphy sign  CT chest 4/20 >> atherosclerosis, small hiatal hernia, small effusions  CT abd/pelvis 4/20 >> cholelithiasis, 10 mm stone Lt proximal ureter with mild hydronephrosis  Renal u/s 4/23 >> b/l renal calculi  Echo 4/23 >> EF 40 to 45%, can r/o PFO, mod TR  Doppler legs b/l 4/23 >> no DVT  CT abd/pelvis 4/25 >> b/l effusions, patchy ASD, large gallstone at GB neck, b/l renal stones  Micro Data:  SARS CoV2 Ag 4/20 >> positive Blood 4/20 >> E coli Urine 4/21 >> negative Legionella Ab 4/21 >> negative Pneumococcal Ag 4/21 >> negative C diff 4/25 >> negative Blood 4/25 >>   Antimicrobials:  Rocephin 4/20 >> 4/26 Ampicillin 4/26 >>   COVID Therapy:  Remdesivir 4/20 >> 4/24 Dexamethasone 4/20 >>  Interim history/subjective:  C/o nausea.  Objective   Blood pressure (!) 150/65, pulse 95, temperature 99.1 F (37.3 C), temperature source Axillary, resp. rate 19, height 5\' 4"  (1.626 m), weight 82 kg, last menstrual  period 02/11/2008, SpO2 97 %.        Intake/Output Summary (Last 24 hours) at 07/03/2019 0814 Last data filed at 07/03/2019 0700 Gross per 24 hour  Intake 2583.76 ml  Output 5703 ml  Net -3119.24 ml   Filed Weights   06/30/19 0500 07/01/19 0500 07/02/19 0431  Weight: 85 kg 82.7 kg 82 kg    Examination:  General - alert Eyes - pupils reactive ENT - no sinus tenderness, no stridor Cardiac - regular rate/rhythm, no murmur Chest - equal breath sounds b/l, no wheezing or rales Abdomen - soft, non tender, + bowel sounds Extremities - no cyanosis, clubbing, or edema Skin - no rashes Neuro - normal strength, moves extremities, follows commands   Resolved Hospital Problem list   Septic shock, hypoglycemia  Assessment & Plan:   E coli bacteremia in setting of nephrolithiasis with Lt hydronephrosis s/p PCN by IR. - change ABx to ampicillin per ID recommendation  AKI from ATN in setting of sepsis. - CRRT per renal  Marked leukocytosis. Thrombocytopenia. - will ask hematology to assess - d/c heparin gtt  COVID 19 positive. - CT imaging from 4/24 shows increasing areas of air space disease - completed remdesivir - day 7/10 of decadron  Cholelithiasis. - f/u LFTs - might need surgery to reassess  Epilepsy. - keppra, topamax, felbamate   Best practice:  Diet: tube feeds DVT prophylaxis: SCDs GI prophylaxis: Protonix Mobility: Bedrest Code Status: Full code Disposition: ICU  Labs:   CMP Latest Ref Rng & Units 07/03/2019 07/02/2019 07/02/2019  Glucose 70 - 99 mg/dL 141(H) 135(H) 132(H)  BUN 8 - 23 mg/dL 32(H) 31(H) 30(H)  Creatinine 0.44 - 1.00 mg/dL 1.37(H) 1.37(H) 1.27(H)  Sodium 135 - 145 mmol/L 134(L) 132(L) 135  Potassium 3.5 - 5.1 mmol/L 4.2 3.6 4.2  Chloride 98 - 111 mmol/L 101 100 102  CO2 22 - 32 mmol/L 26 25 26   Calcium 8.9 - 10.3 mg/dL 8.1(L) 7.8(L) 7.9(L)  Total Protein 6.5 - 8.1 g/dL 6.1(L) - 5.9(L)  Total Bilirubin 0.3 - 1.2 mg/dL 0.7 - 0.7    Alkaline Phos 38 - 126 U/L 240(H) - 235(H)  AST 15 - 41 U/L 25 - 36  ALT 0 - 44 U/L 34 - 42    CBC Latest Ref Rng & Units 07/03/2019 07/02/2019 07/01/2019  WBC 4.0 - 10.5 K/uL 102.0(HH) 90.8(HH) 63.6(HH)  Hemoglobin 12.0 - 15.0 g/dL 11.9(L) 11.1(L) 10.0(L)  Hematocrit 36.0 - 46.0 % 34.8(L) 33.8(L) 29.4(L)  Platelets 150 - 400 K/uL 72(L) 58(L) 39(L)    ABG    Component Value Date/Time   PHART 7.361 06/29/2019 0530   PCO2ART 32.5 06/29/2019 0530   PO2ART 78.0 (L) 06/29/2019 0530   HCO3 18.0 (L) 06/29/2019 0530   ACIDBASEDEF 6.2 (H) 06/29/2019 0530   O2SAT 67.8 06/30/2019 0920    CBG (last 3)  Recent Labs    07/02/19 2322 07/03/19 0305 07/03/19 0826  GLUCAP 114* 100* 136*    Critical care time: 38 minutes   Chesley Mires, MD [place] Pager - [phone] 07/03/2019, 8:39 AM

## 2019-07-03 NOTE — Progress Notes (Signed)
Patient ID: Amber Bowman, female   DOB: 1956/02/10, 64 y.o.   MRN: [id number] [facility] Progress Note   Assessment/ Plan:   1. Acute kidney Injury: likely ATN from sepsis/shock and a component of urinary obstruction- s/p left percutaneous nephrostomy tube placement on 4/22. With 445mL UOP overnight and optimistic for renal recovery in the next few days. Continue CRRT at the current prescription this time for management of azotemia and volume.  2. Volume overload: secondary to AKI/fluid accumulation--on CRRT for UF.  3. E. Coli bacteremia and sepsis- likely of urinary source and on ampicillin at this time for focused coverage--SIGNIFICANT leukocytosis noted. Seen yesterday by ID service- unclear if needs additional abdominal imaging to look for perinephric abscess.  4. Left ureteral calculus with hydronephrosis: s/p PCN  5. Seizure disorder: may need to re-evaluate anti-seizure medication choices given risk of recurrent nephrolithiasis with topamax.   Subjective:   Problems with CRRT noted overnight- filter changed this morning   Objective:   BP (!) 150/65   Pulse (!) 103   Temp 97.9 F (36.6 C) (Oral)   Resp (!) 22   Ht 5\' 4"  (1.626 m)   Wt 82 kg   LMP 02/11/2008   SpO2 100%   BMI 31.03 kg/m   Intake/Output Summary (Last 24 hours) at 07/03/2019 1152 Last data filed at 07/03/2019 1100 Gross per 24 hour  Intake 2623.94 ml  Output 5882 ml  Net -3258.06 ml   Weight change:   Physical Exam: [id number] comfortable resting in bed, RN at bedside  [id number] regular tachycardia, normal s1 and s2  Resp:Clear to auscultation, no rales/rhonchi [id number], obese, with left sided nephrostomy tube in place Ext: 1+ RLE edema, trace LLE edema  Imaging: CT ABDOMEN PELVIS WO CONTRAST  Result Date: 07/02/2019 CLINICAL DATA:  Abdominal pain, acute with increased white blood cell count. EXAM: CT ABDOMEN AND PELVIS WITHOUT CONTRAST TECHNIQUE: Multidetector CT imaging of the  abdomen and pelvis was performed following the standard protocol without IV contrast. COMPARISON:  06/27/2019 FINDINGS: Lower chest: Enlargement of bilateral pleural effusions. Areas of patchy airspace and interstitial opacities abnormality since the previous exam in areas of aerated lung. Moderate basilar volume loss bilaterally. Hepatobiliary: Large gallstone in the gallbladder neck. Gallbladder is less distended than on the prior study. Trace pericholecystic fluid. No pericholecystic stranding. Liver is lobular without focal lesion on noncontrast imaging. No gross biliary ductal dilation. Pancreas: Pancreas is normal. No signs of inflammation, ductal dilation or lesion. Spleen: Spleen is normal size. No focal lesion. Adrenals/Urinary Tract: Adrenal glands are normal. Nephrolithiasis in the RIGHT kidney with similar appearance to the prior study. LEFT-sided nephrostomy tube remains in place. Moderate sized calculus that was present in the posterior LEFT interpolar calyx on the prior study measuring 4-5 mm has migrated into the renal pelvis near the UPJ just proximal to the dominant calculus. Stomach/Bowel: Moderate size hiatal hernia. Gastric tube in situ, tube terminates in the duodenal bulb, tenting the duodenal bulb. No perigastric stranding. Colonic diverticulosis. Normal appendix. Vascular/Lymphatic: Calcified atheromatous plaque in the abdominal aorta. No aneurysm. No adenopathy in the retroperitoneum or in the upper abdomen. No adenopathy in the pelvis. Reproductive: Unremarkable by CT. Other: Mild body wall edema. Musculoskeletal: No acute bone finding or destructive bone process. Levoconvex curvature of the lumbar spine with similar appearance associated with degenerative change. IMPRESSION: 1. Enlargement of bilateral pleural effusions and patchy airspace and interstitial opacities since the previous study in areas of aerated lung. Findings could represent pneumonitis/infection,  correlate with  respiratory symptoms. 2. LEFT-sided nephrostomy tube remains in place. Moderate sized calculus that was present in the posterior LEFT interpolar calyx on the prior study measuring 4-5 mm has migrated into the renal pelvis near the UPJ just proximal to the dominant calculus. No hydronephrosis. 3. Large gallstone in the gallbladder neck. Gallbladder is less distended than on the prior study. Trace pericholecystic fluid. No pericholecystic stranding. Correlate with any symptoms or laboratory values that would suggest biliary pathology with HIDA scan as warranted. 4. Gastric tube in situ, tube terminates in the duodenal bulb, tenting the duodenal bulb. Suggest retraction into the stomach, approximately 5-6 cm retraction. 5. Colonic diverticulosis without evidence of acute diverticulitis. 6. Aortic atherosclerosis. These results will be called to the ordering clinician or representative by the Radiologist Assistant, and communication documented in the PACS or [employer]. Aortic Atherosclerosis (ICD10-I70.0). Electronically Signed   By: Zetta Bills M.D.   On: 07/02/2019 11:39   DG Abd 1 View  Result Date: 07/02/2019 CLINICAL DATA:  Evaluate NG tube EXAM: ABDOMEN - 1 VIEW COMPARISON:  None. FINDINGS: The distal tip of the NG tube is likely near the junction of the pylorus and proximal duodenum. A left nephrostomy tube is identified. Bilateral renal stones are noted. IMPRESSION: The NG tube is stable. I suspect the distal tip is within the duodenal bulb near the junction of the pylorus and duodenum. Electronically Signed   By: Dorise Bullion III M.D   On: 07/02/2019 15:31   DG Chest Port 1 View  Result Date: 07/02/2019 CLINICAL DATA:  64 year old female with history of respiratory failure. EXAM: PORTABLE CHEST 1 VIEW COMPARISON:  Chest x-ray 07/01/2019. FINDINGS: There is a right-sided internal jugular central venous catheter with tip terminating in the mid superior vena cava. A nasogastric tube is seen  extending into the stomach, however, the tip of the nasogastric tube extends below the lower margin of the image. Lung volumes are normal. There continues to be patchy ill-defined areas of airspace consolidation and interstitial prominence scattered throughout the lungs bilaterally, but overall aeration has significantly improved compared to yesterday's examination. No definite pleural effusions. Heart size is normal. Upper mediastinal contours are within normal limits. Aortic atherosclerosis. IMPRESSION: 1. Support apparatus, as above. 2. Improving aeration throughout the lungs bilaterally which could reflect resolving edema and/or multilobar pneumonia. 3. Aortic atherosclerosis. Electronically Signed   By: Vinnie Langton M.D.   On: 07/02/2019 06:49    Labs: BMET Recent Labs  Lab 06/30/19 0417 06/30/19 1529 07/01/19 0419 07/01/19 1618 07/02/19 0400 07/02/19 1524 07/03/19 0441  NA 137 137 135 135 135 132* 134*  K 4.1 3.7 4.2 3.8 4.2 3.6 4.2  CL 102 105 100 102 102 100 101  CO2 22 24 26 26 26 25 26   GLUCOSE 150* 104* 120* 101* 132* 135* 141*  BUN 40* 35* 31* 29* 30* 31* 32*  CREATININE 2.34* 1.74* 1.51* 1.25* 1.27* 1.37* 1.37*  CALCIUM 7.6* 7.3* 7.6* 7.9* 7.9* 7.8* 8.1*  PHOS 2.2* 1.8* 2.1* 1.2* 1.3* 1.5* 1.5*   CBC Recent Labs  Lab 06/30/19 0417 06/30/19 0417 06/30/19 0920 07/01/19 0419 07/02/19 0400 07/03/19 0441  WBC 40.2*  --   --  63.6* 90.8* 102.0*  NEUTROABS 33.8*  --   --  45.8* 74.5* 67.3*  HGB 9.9*  --   --  10.0* 11.1* 11.9*  HCT 28.7*  --   --  29.4* 33.8* 34.8*  MCV 92.9  --   --  93.0 92.1  91.3  PLT 48*   < > 43* 39* 58* 72*   < > = values in this interval not displayed.    Medications:    . Chlorhexidine Gluconate Cloth  6 each Topical Daily  . dexamethasone (DECADRON) injection  6 mg Intravenous Q24H  . feeding supplement (PRO-STAT SUGAR FREE 64)  30 mL Per Tube BID  . felbamate  300 mg Per Tube Q12H  . insulin aspart  0-9 Units Subcutaneous Q4H  .  levETIRAcetam  500 mg Per Tube BID  . mouth rinse  15 mL Mouth Rinse BID  . multivitamin  15 mL Per Tube Daily  . pantoprazole sodium  40 mg Per Tube QHS  . sodium chloride flush  10-40 mL Intracatheter Q12H  . topiramate  200 mg Per Tube BID   Elmarie Shiley, MD 07/03/2019, 11:52 AM

## 2019-07-03 NOTE — Consult Note (Addendum)
[place]  [phone] [phone]    INITIAL HEMATOLOGY CONSULTATION  Referring MD:  Dr. Chesley Mires  Reason for Referral: Leukocytosis, anemia, and thrombocytopenia  HPI: Amber Bowman is a 64 year old female with a past medical history significant for depression, seizures, osteopenia.  The patient presented to the emergency room on 06/27/2019 with weakness and back pain.  She has had 2 to 3-day history of these symptoms.  In the emergency room, she was noted to be hypotensive.  She was having nausea and vomiting and loose stools, 3-5 episodes.  She was also having abdominal pain in the epigastric and right upper quadrant area as well as flank pain.  On admission, her WBC and hemoglobin were normal, platelets were low at 89,000 -on the day prior, Was completely normal 280,000.  Her potassium was 3.1, BUN 37 creatinine 3.13-on the day prior, her creatinine was 1.44 BUN was normal at 22.  Lactic acid was elevated at 4.6. Blood cultures from admission were positive for E. coli.  Repeat blood cultures performed on 07/02/2019, negative to date.  Urine Culture showed no growth.  The patient was COVID-19 positive on admission.  A CT of the chest, abdomen, pelvis without contrast performed on admission showed left-sided obstructive uropathy in the proximal left ureter causing mild hydroureteronephrosis and perinephric stranding, multiple bilateral nonobstructing renal calculi, small pleural effusions with basilar atelectasis.  She had a left nephrostomy tube placed by IR on 06/28/2019.  The patient initially received Rocephin and has been switched to ampicillin starting today.  She completed a course of remdesivir and is on dexamethasone 6 mg IV daily starting on 06/27/2019.  The patient remains on CRRT with improvement in her function.  Still having intermittent low-grade fevers.  The patient has been seen by ID who recommended testing for C. difficile due to diarrhea and  elevated WBC.  Stool for C. difficile negative.  Review of patient's CBC from today show that her WBC is 100.2, hemoglobin 11.9 platelet count 72,000.  The patient's absolute neutrophils, lymphocytes, and monocytes are all increasing. A CBC from 06/26/2019 showed a normal WBC of 4.2, hemoglobin 13.6, and platelet count of 280,000.  Her differential was normal with the exception of a slightly decreased absolute lymphocyte count of 0.5.  When seen today, the patient is sleeping quietly.  She awakens easily to my voice.  Still having fatigue.  Had fevers prior to admission but no chills. Had a low-grade fever this morning.  Reports some mild nausea this morning.  No vomiting.  Still having loose stools.  Still has decreased urine output.  She has no headaches or dizziness.  Denies chest pain or shortness of breath.  Denies cough.  No bleeding reported.  Hematology was asked see the patient to make recommendations regarding her leukocytosis, mild anemia, and thrombocytopenia.  Past Medical History:  Diagnosis Date  . Colon polyps 2014  . Condyloma   . Depression   . Epilepsy ([place])   . Osteopenia 04/2018   T score -1.8 FRAX 9.5% / 1.1% overall stable from prior DEXA  . Seizures ([place])   :    Past Surgical History:  Procedure Laterality Date  . CATARACT EXTRACTION  03/2015  . IR NEPHROSTOMY PLACEMENT LEFT  06/28/2019  :   CURRENT MEDS: Current Facility-Administered Medications  Medication Dose Route Frequency Provider Last Rate Last Admin  .  prismasol BGK 4/2.5 infusion   CRRT Continuous Roney Jaffe, MD 400 mL/hr at 07/02/19 1413  New Bag at 07/02/19 1413  .  prismasol BGK 4/2.5 infusion   CRRT Continuous Roney Jaffe, MD 200 mL/hr at 07/02/19 1408 New Bag at 07/02/19 1408  . 0.9 %  sodium chloride infusion   Intra-arterial PRN Nanavati, Ankit, MD      . 0.9 %  sodium chloride infusion  250 mL Intravenous Continuous Brand Males, MD   Stopped at 07/01/19 0009  . acetaminophen  (TYLENOL) 160 MG/5ML solution 650 mg  650 mg Per Tube Q6H PRN Juanito Doom, MD   650 mg at 07/03/19 0925  . alteplase (CATHFLO ACTIVASE) injection 2 mg  2 mg Intracatheter Once PRN Roney Jaffe, MD      . ampicillin (OMNIPEN) 2 g in sodium chloride 0.9 % 100 mL IVPB  2 g Intravenous Q8H Sood, Vineet, MD      . Chlorhexidine Gluconate Cloth 2 % PADS 6 each  6 each Topical Daily Brand Males, MD   6 each at 07/03/19 0911  . dexamethasone (DECADRON) injection 6 mg  6 mg Intravenous Q24H Brand Males, MD   6 mg at 07/02/19 1757  . dextrose 10 % infusion   Intravenous Continuous Stretch, Marily Lente, MD 20 mL/hr at 07/02/19 1718 New Bag at 07/02/19 1718  . feeding supplement (PRO-STAT SUGAR FREE 64) liquid 30 mL  30 mL Per Tube BID Noemi Chapel P, DO   30 mL at 07/03/19 0917  . feeding supplement (VITAL 1.5 CAL) liquid 1,000 mL  1,000 mL Per Tube Continuous Simonne Maffucci B, MD 50 mL/hr at 07/02/19 1309 1,000 mL at 07/02/19 1309  . felbamate (FELBATOL) 600 MG/5ML suspension 300 mg  300 mg Per Tube Q12H Simonne Maffucci B, MD   300 mg at 07/03/19 0917  . fentaNYL (SUBLIMAZE) injection 25 mcg  25 mcg Intravenous Q2H PRN Julian Hy, DO   25 mcg at 07/03/19 0925  . heparin injection 1,000-6,000 Units  1,000-6,000 Units CRRT PRN Merlene Laughter F, NP   1,200 Units at 07/02/19 1035  . heparin injection 1,000-6,000 Units  1,000-6,000 Units CRRT PRN Roney Jaffe, MD   1,200 Units at 07/02/19 1034  . insulin aspart (novoLOG) injection 0-9 Units  0-9 Units Subcutaneous Q4H Merlene Laughter F, NP   1 Units at 07/03/19 0827  . levETIRAcetam (KEPPRA) 100 MG/ML solution 500 mg  500 mg Per Tube BID Simonne Maffucci B, MD   500 mg at 07/03/19 0917  . MEDLINE mouth rinse  15 mL Mouth Rinse BID Simonne Maffucci B, MD   15 mL at 07/03/19 0911  . multivitamin liquid 15 mL  15 mL Per Tube Daily Juanito Doom, MD   15 mL at 07/03/19 0917  . ondansetron (ZOFRAN) injection 4 mg  4 mg Intravenous Q6H  PRN Frederik Pear, MD   4 mg at 07/01/19 0216  . pantoprazole sodium (PROTONIX) 40 mg/20 mL oral suspension 40 mg  40 mg Per Tube QHS Juanito Doom, MD   40 mg at 07/02/19 2113  . phenol (CHLORASEPTIC) mouth spray 1 spray  1 spray Mouth/Throat PRN Juanito Doom, MD   1 spray at 07/01/19 3903  . prismasol BGK 4/2.5 infusion   CRRT Continuous Roney Jaffe, MD 1,800 mL/hr at 07/02/19 1841 New Bag at 07/02/19 1841  . sodium chloride 0.9 % primer fluid for CRRT   CRRT PRN Roney Jaffe, MD   2,000 mL at 06/29/19 0935  . sodium chloride flush (NS) 0.9 % injection 10-40 mL  10-40  mL Intracatheter Q12H Juanito Doom, MD   40 mL at 07/03/19 0912  . sodium chloride flush (NS) 0.9 % injection 10-40 mL  10-40 mL Intracatheter PRN Simonne Maffucci B, MD      . topiramate (TOPAMAX) tablet 200 mg  200 mg Per Tube BID Simonne Maffucci B, MD   200 mg at 07/03/19 0917      No Known Allergies:  Family History  Problem Relation Age of Onset  . Hypertension Mother   . Diabetes Mother   . Heart disease Mother   . Hypertension Father   . Heart disease Father   . Diabetes Sister   . Hypertension Sister   . Heart disease Sister   . Seizures Neg Hx   :  Social History   Socioeconomic History  . Marital status: Legally Separated    Spouse name: Not on file  . Number of children: 0  . Years of education: 65  . Highest education level: Not on file  Occupational History    Comment: Disabled.  Tobacco Use  . Smoking status: Never Smoker  . Smokeless tobacco: Never Used  Substance and Sexual Activity  . Alcohol use: No    Alcohol/week: 0.0 standard drinks  . Drug use: No  . Sexual activity: Yes    Partners: Male    Birth control/protection: Post-menopausal    Comment: DECLINED INSURANCE QUESTIONS  Other Topics Concern  . Not on file  Social History Narrative   Patient lives at home alone single.   Patient is disabled.   Education some college education.   Right handed.     Caffeine four cups of tea daily.   Social Determinants of Health   Financial Resource Strain:   . Difficulty of Paying Living Expenses:   Food Insecurity:   . Worried About Charity fundraiser in the Last Year:   . Arboriculturist in the Last Year:   Transportation Needs:   . Film/video editor (Medical):   Marland Kitchen Lack of Transportation (Non-Medical):   Physical Activity:   . Days of Exercise per Week:   . Minutes of Exercise per Session:   Stress:   . Feeling of Stress :   Social Connections:   . Frequency of Communication with Friends and Family:   . Frequency of Social Gatherings with Friends and Family:   . Attends Religious Services:   . Active Member of Clubs or Organizations:   . Attends Archivist Meetings:   Marland Kitchen Marital Status:   Intimate Partner Violence:   . Fear of Current or Ex-Partner:   . Emotionally Abused:   Marland Kitchen Physically Abused:   . Sexually Abused:   :  REVIEW OF SYSTEMS: A comprehensive 14 point review of systems was negative except as noted in the HPI.  Exam: Patient Vitals for the past 24 hrs:  Temp Temp src Pulse Resp SpO2  07/03/19 0600 -- -- 95 19 97 %  07/03/19 0500 -- -- 90 15 100 %  07/03/19 0400 99.1 F (37.3 C) Axillary -- 20 100 %  07/03/19 0300 -- -- -- 17 --  07/03/19 0200 -- -- -- 17 --  07/03/19 0100 -- -- -- 18 --  07/03/19 0000 99.8 F (37.7 C) Axillary -- 15 100 %  07/02/19 2300 -- -- 99 18 98 %  07/02/19 2200 -- -- (!) 103 18 98 %  07/02/19 2100 -- -- (!) 103 16 100 %  07/02/19 2030 -- -- 100  16 98 %  07/02/19 2015 -- -- (!) 101 16 98 %  07/02/19 2000 99.9 F (37.7 C) Axillary 100 (!) 22 99 %  07/02/19 1900 -- -- 99 19 91 %  07/02/19 1800 -- -- (!) 110 15 98 %  07/02/19 1700 -- -- 89 14 100 %  07/02/19 1600 -- -- 89 (!) 24 100 %  07/02/19 1501 -- -- 93 15 99 %  07/02/19 1500 98 F (36.7 C) Oral 88 15 100 %  07/02/19 1400 -- -- 92 20 99 %  07/02/19 1300 -- -- 81 16 98 %  07/02/19 1200 -- -- 89 (!) 22 99 %   07/02/19 1100 -- -- 88 (!) 21 98 %  07/02/19 1000 -- -- 93 (!) 24 97 %    General: Chronically ill-appearing female, no distress Eyes:  no scleral icterus.   ENT:  There were no oropharyngeal lesions.     Lymphatics:  Negative cervical, supraclavicular or axillary adenopathy.  Respiratory: lungs were clear bilaterally without wheezing or crackles.  Cardiovascular:  Regular rate and rhythm, S1/S2, without murmur, rub or gallop.  Trace bilateral lower extremity edema. GI:  abdomen was soft, flat, nontender, nondistended, without organomegaly.   Skin exam was without ecchymosis, petechiae.   Neuro exam was nonfocal.  Patient was alert and oriented.  Attention was good.   Language was appropriate.  Mood was normal without depression.  Speech was not pressured.  Thought content was not tangential.    Right IJ catheter without erythema or drainage  LABS:  Lab Results  Component Value Date   WBC 102.0 (HH) 07/03/2019   HGB 11.9 (L) 07/03/2019   HCT 34.8 (L) 07/03/2019   PLT 72 (L) 07/03/2019   GLUCOSE 141 (H) 07/03/2019   ALT 34 07/03/2019   AST 25 07/03/2019   NA 134 (L) 07/03/2019   K 4.2 07/03/2019   CL 101 07/03/2019   CREATININE 1.37 (H) 07/03/2019   BUN 32 (H) 07/03/2019   CO2 26 07/03/2019   INR 1.3 (H) 06/30/2019    CT ABDOMEN PELVIS WO CONTRAST  Result Date: 07/02/2019 CLINICAL DATA:  Abdominal pain, acute with increased white blood cell count. EXAM: CT ABDOMEN AND PELVIS WITHOUT CONTRAST TECHNIQUE: Multidetector CT imaging of the abdomen and pelvis was performed following the standard protocol without IV contrast. COMPARISON:  06/27/2019 FINDINGS: Lower chest: Enlargement of bilateral pleural effusions. Areas of patchy airspace and interstitial opacities abnormality since the previous exam in areas of aerated lung. Moderate basilar volume loss bilaterally. Hepatobiliary: Large gallstone in the gallbladder neck. Gallbladder is less distended than on the prior study. Trace  pericholecystic fluid. No pericholecystic stranding. Liver is lobular without focal lesion on noncontrast imaging. No gross biliary ductal dilation. Pancreas: Pancreas is normal. No signs of inflammation, ductal dilation or lesion. Spleen: Spleen is normal size. No focal lesion. Adrenals/Urinary Tract: Adrenal glands are normal. Nephrolithiasis in the RIGHT kidney with similar appearance to the prior study. LEFT-sided nephrostomy tube remains in place. Moderate sized calculus that was present in the posterior LEFT interpolar calyx on the prior study measuring 4-5 mm has migrated into the renal pelvis near the UPJ just proximal to the dominant calculus. Stomach/Bowel: Moderate size hiatal hernia. Gastric tube in situ, tube terminates in the duodenal bulb, tenting the duodenal bulb. No perigastric stranding. Colonic diverticulosis. Normal appendix. Vascular/Lymphatic: Calcified atheromatous plaque in the abdominal aorta. No aneurysm. No adenopathy in the retroperitoneum or in the upper abdomen. No adenopathy in the pelvis.  Reproductive: Unremarkable by CT. Other: Mild body wall edema. Musculoskeletal: No acute bone finding or destructive bone process. Levoconvex curvature of the lumbar spine with similar appearance associated with degenerative change. IMPRESSION: 1. Enlargement of bilateral pleural effusions and patchy airspace and interstitial opacities since the previous study in areas of aerated lung. Findings could represent pneumonitis/infection, correlate with respiratory symptoms. 2. LEFT-sided nephrostomy tube remains in place. Moderate sized calculus that was present in the posterior LEFT interpolar calyx on the prior study measuring 4-5 mm has migrated into the renal pelvis near the UPJ just proximal to the dominant calculus. No hydronephrosis. 3. Large gallstone in the gallbladder neck. Gallbladder is less distended than on the prior study. Trace pericholecystic fluid. No pericholecystic stranding.  Correlate with any symptoms or laboratory values that would suggest biliary pathology with HIDA scan as warranted. 4. Gastric tube in situ, tube terminates in the duodenal bulb, tenting the duodenal bulb. Suggest retraction into the stomach, approximately 5-6 cm retraction. 5. Colonic diverticulosis without evidence of acute diverticulitis. 6. Aortic atherosclerosis. These results will be called to the ordering clinician or representative by the Radiologist Assistant, and communication documented in the PACS or [employer]. Aortic Atherosclerosis (ICD10-I70.0). Electronically Signed   By: Zetta Bills M.D.   On: 07/02/2019 11:39   CT ABDOMEN PELVIS WO CONTRAST  Result Date: 06/27/2019 CLINICAL DATA:  Sepsis EXAM: CT CHEST, ABDOMEN AND PELVIS WITHOUT CONTRAST TECHNIQUE: Multidetector CT imaging of the chest, abdomen and pelvis was performed following the standard protocol without IV contrast. COMPARISON:  None. FINDINGS: CT CHEST FINDINGS Cardiovascular: Heart size is normal. There are atherosclerotic calcifications of the thoracic aorta. Mediastinum/Nodes: Small hiatal hernia. No mediastinal or axillary lymphadenopathy. Lungs/Pleura: Small pleural effusions with basilar atelectasis. No other consolidation. There is an incidentally noted azygos fissure. Central airways are patent. Musculoskeletal: No chest wall mass or suspicious bone lesions identified. CT ABDOMEN PELVIS FINDINGS HEPATOBILIARY: Normal hepatic contours. No intra- or extrahepatic biliary dilatation. There is cholelithiasis with a hydropic gallbladder. No evidence of acute inflammation. PANCREAS: Normal pancreas. No ductal dilatation or peripancreatic fluid collection. SPLEEN: Normal. ADRENALS/URINARY TRACT: The adrenal glands are normal. There is a stone within the proximal left ureter measuring 10 mm, causing mild hydroureteronephrosis and mild perinephric stranding. Additionally, there are multiple bilateral nonobstructing renal calculi  that measure up to 6 mm. The urinary bladder is normal for degree of distention STOMACH/BOWEL: There is a small hiatal hernia. Normal duodenal course and caliber. No small bowel dilatation or inflammation. No focal colonic abnormality. Normal appendix. VASCULAR/LYMPHATIC: There is calcific atherosclerosis of the abdominal aorta. No abdominal or pelvic lymphadenopathy. REPRODUCTIVE: Normal uterus and ovaries. MUSCULOSKELETAL. No bony spinal canal stenosis or focal osseous abnormality. OTHER: None. IMPRESSION: 1. Left-sided obstructive uropathy with 10 mm stone in the proximal left ureter causing mild hydroureteronephrosis and perinephric stranding. 2. Multiple bilateral nonobstructing renal calculi measuring up to 6 mm. 3. Small pleural effusions with basilar atelectasis. 4. Cholelithiasis with hydropic gallbladder. No evidence of acute inflammation. 5. Aortic Atherosclerosis (ICD10-I70.0). Electronically Signed   By: Ulyses Jarred M.D.   On: 06/27/2019 22:09   DG Chest 1 View  Result Date: 06/28/2019 CLINICAL DATA:  COVID positive, central line placement. EXAM: CHEST  1 VIEW COMPARISON:  Chest radiograph and CT chest 06/27/2019. FINDINGS: Right IJ central line tip is in the SVC. No pneumothorax. Patient is slightly rotated. Trachea is midline. Heart size normal. Next articular airspace opacification with small bilateral pleural effusions. IMPRESSION: 1. Right IJ central line placement  without complicating feature. 2. Pulmonary edema and small bilateral pleural effusions. Electronically Signed   By: Lorin Picket M.D.   On: 06/28/2019 11:28   DG Abd 1 View  Result Date: 07/02/2019 CLINICAL DATA:  Evaluate NG tube EXAM: ABDOMEN - 1 VIEW COMPARISON:  None. FINDINGS: The distal tip of the NG tube is likely near the junction of the pylorus and proximal duodenum. A left nephrostomy tube is identified. Bilateral renal stones are noted. IMPRESSION: The NG tube is stable. I suspect the distal tip is within the  duodenal bulb near the junction of the pylorus and duodenum. Electronically Signed   By: Dorise Bullion III M.D   On: 07/02/2019 15:31   DG Abd 1 View  Result Date: 06/29/2019 CLINICAL DATA:  64 year old female with feeding tube placement. EXAM: ABDOMEN - 1 VIEW COMPARISON:  CT abdomen pelvis dated 06/27/2019. FINDINGS: An enteric tube is noted with tip in the distal stomach likely in the region of the gastric antrum or pylorus. There is no bowel dilatation or evidence of obstruction. No free air identified. A left percutaneous nephrostomy is noted. Several bilateral renal calculi again seen. The osseous structures and soft tissues are grossly unremarkable. IMPRESSION: Enteric tube with tip in the distal stomach. Electronically Signed   By: Anner Crete M.D.   On: 06/29/2019 15:35   CT CHEST WO CONTRAST  Result Date: 06/27/2019 CLINICAL DATA:  Sepsis EXAM: CT CHEST, ABDOMEN AND PELVIS WITHOUT CONTRAST TECHNIQUE: Multidetector CT imaging of the chest, abdomen and pelvis was performed following the standard protocol without IV contrast. COMPARISON:  None. FINDINGS: CT CHEST FINDINGS Cardiovascular: Heart size is normal. There are atherosclerotic calcifications of the thoracic aorta. Mediastinum/Nodes: Small hiatal hernia. No mediastinal or axillary lymphadenopathy. Lungs/Pleura: Small pleural effusions with basilar atelectasis. No other consolidation. There is an incidentally noted azygos fissure. Central airways are patent. Musculoskeletal: No chest wall mass or suspicious bone lesions identified. CT ABDOMEN PELVIS FINDINGS HEPATOBILIARY: Normal hepatic contours. No intra- or extrahepatic biliary dilatation. There is cholelithiasis with a hydropic gallbladder. No evidence of acute inflammation. PANCREAS: Normal pancreas. No ductal dilatation or peripancreatic fluid collection. SPLEEN: Normal. ADRENALS/URINARY TRACT: The adrenal glands are normal. There is a stone within the proximal left ureter measuring  10 mm, causing mild hydroureteronephrosis and mild perinephric stranding. Additionally, there are multiple bilateral nonobstructing renal calculi that measure up to 6 mm. The urinary bladder is normal for degree of distention STOMACH/BOWEL: There is a small hiatal hernia. Normal duodenal course and caliber. No small bowel dilatation or inflammation. No focal colonic abnormality. Normal appendix. VASCULAR/LYMPHATIC: There is calcific atherosclerosis of the abdominal aorta. No abdominal or pelvic lymphadenopathy. REPRODUCTIVE: Normal uterus and ovaries. MUSCULOSKELETAL. No bony spinal canal stenosis or focal osseous abnormality. OTHER: None. IMPRESSION: 1. Left-sided obstructive uropathy with 10 mm stone in the proximal left ureter causing mild hydroureteronephrosis and perinephric stranding. 2. Multiple bilateral nonobstructing renal calculi measuring up to 6 mm. 3. Small pleural effusions with basilar atelectasis. 4. Cholelithiasis with hydropic gallbladder. No evidence of acute inflammation. 5. Aortic Atherosclerosis (ICD10-I70.0). Electronically Signed   By: Ulyses Jarred M.D.   On: 06/27/2019 22:09   US RENAL  Result Date: 06/30/2019 CLINICAL DATA:  Sepsis. EXAM: RENAL / URINARY TRACT ULTRASOUND COMPLETE COMPARISON:  CT scan 06/27/2019 FINDINGS: Right Kidney: Renal measurements: 11.2 x 5.2 x 5.0 cm = volume: 151.7 mL. Normal renal cortical thickness and echogenicity. No worrisome renal lesions or hydronephrosis. 5 mm midpole calculus noted. Left Kidney: Renal measurements: 12.2  x 6.3 x 5.8 cm = volume: 233 mL. Normal renal cortical thickness and echogenicity without renal lesions. A left-sided nephrostomy tube is in place. No hydronephrosis. Small renal calculi are noted. Bladder: Decompressed by a Foley catheter. Other: None. IMPRESSION: 1. Bilateral renal calculi. 2. Left nephrostomy tube in place.  No left-sided hydronephrosis. 3. Bladder decompressed by a Foley catheter. Electronically Signed   By: Marijo Sanes M.D.   On: 06/30/2019 11:08   US Aorta  Result Date: 06/27/2019 CLINICAL DATA:  Back pain, weakness, evaluate for abdominal aortic aneurysm EXAM: ULTRASOUND OF ABDOMINAL AORTA TECHNIQUE: Ultrasound examination of the abdominal aorta and proximal common iliac arteries was performed to evaluate for aneurysm. Additional color and Doppler images of the distal aorta were obtained to document patency. COMPARISON:  None. FINDINGS: Abdominal aortic measurements as follows: Proximal:  2.2 cm Mid:  1.8 cm Distal:  1.7 cm Patent: Yes, peak systolic velocity is 61 cm/s Right common iliac artery: Not visualized cm Left common iliac artery: Not visualized cm IMPRESSION: 1.  No abdominal aortic aneurysm identified. 2. Absence of aortic aneurysm by ultrasound does not exclude aortic dissection, intramural hematoma, or other acute aortic pathology. Consider CT angiogram to further evaluate if there is high clinical concern for aortic pathology in the setting of acute pain. Electronically Signed   By: Eddie Candle M.D.   On: 06/27/2019 17:03   DG Chest Port 1 View  Result Date: 07/02/2019 CLINICAL DATA:  64 year old female with history of respiratory failure. EXAM: PORTABLE CHEST 1 VIEW COMPARISON:  Chest x-ray 07/01/2019. FINDINGS: There is a right-sided internal jugular central venous catheter with tip terminating in the mid superior vena cava. A nasogastric tube is seen extending into the stomach, however, the tip of the nasogastric tube extends below the lower margin of the image. Lung volumes are normal. There continues to be patchy ill-defined areas of airspace consolidation and interstitial prominence scattered throughout the lungs bilaterally, but overall aeration has significantly improved compared to yesterday's examination. No definite pleural effusions. Heart size is normal. Upper mediastinal contours are within normal limits. Aortic atherosclerosis. IMPRESSION: 1. Support apparatus, as above. 2.  Improving aeration throughout the lungs bilaterally which could reflect resolving edema and/or multilobar pneumonia. 3. Aortic atherosclerosis. Electronically Signed   By: Vinnie Langton M.D.   On: 07/02/2019 06:49   DG CHEST PORT 1 VIEW  Result Date: 07/01/2019 CLINICAL DATA:  COVID-19 positive.  Shortness of breath. EXAM: PORTABLE CHEST 1 VIEW COMPARISON:  June 29, 2019 FINDINGS: The right central line terminates in the central SVC, advanced in the interval. The NG tube terminates below today's film. No pneumothorax. Bilateral pulmonary infiltrates have worsened in the interval, particularly on the left. No change in the cardiomediastinal silhouette. IMPRESSION: 1. Support apparatus as above. 2. Worsening pulmonary infiltrates. Electronically Signed   By: Dorise Bullion III M.D   On: 07/01/2019 13:12   DG CHEST PORT 1 VIEW  Result Date: 06/29/2019 CLINICAL DATA:  Fever, elevated heart rate, LEFT-sided flank pain. EXAM: PORTABLE CHEST 1 VIEW COMPARISON:  Chest x-rays dated 06/28/2019 and 06/27/2019. FINDINGS: Heart size and mediastinal contours are stable. Central pulmonary vascular congestion and bilateral perihilar interstitial edema. Small bilateral pleural effusions and bibasilar atelectasis better demonstrated on recent chest CT. No pneumothorax is seen. RIGHT IJ central line is stable in position with tip at the level of the upper SVC. IMPRESSION: 1. Central pulmonary vascular congestion and bilateral perihilar interstitial edema suggesting volume overload/CHF. 2. No evidence of pneumonia.  3. Small bilateral pleural effusions and bibasilar atelectasis better demonstrated on recent chest CT. Electronically Signed   By: Franki Cabot M.D.   On: 06/29/2019 13:09   DG Chest Port 1 View  Result Date: 06/27/2019 CLINICAL DATA:  LEFT-sided back pain EXAM: PORTABLE CHEST 1 VIEW COMPARISON:  None. FINDINGS: Normal cardiac silhouette. Mild venous congestion. Prominent azygos fissure. No pneumothorax. No  pulmonary edema. No infiltrate. No acute osseous abnormality. IMPRESSION: Central venous congestion. Electronically Signed   By: Suzy Bouchard M.D.   On: 06/27/2019 15:02   ECHOCARDIOGRAM COMPLETE  Result Date: 06/30/2019    ECHOCARDIOGRAM REPORT   Patient Name:   Amber Bowman Date of Exam: 06/30/2019 Medical Rec #:  [id number]      Height:       64.0 in Accession #:    [id number]     Weight:       187.4 lb Date of Birth:  09-04-1955     BSA:          1.903 m Patient Age:    67 years       BP:           143/65 mmHg Patient Gender: F              HR:           63 bpm. Exam Location:  Inpatient Procedure: 2D Echo Indications:    Dyspnea                 ; R06.9 DOE  History:        Patient has no prior history of Echocardiogram examinations.                 Signs/Symptoms:Dyspnea.  Sonographer:    Raquel Sarna Senior Referring Phys: [place]  1. Diffuse hypokinesis with abnormal septal motion . Left ventricular ejection fraction, by estimation, is 40 to 45%. The left ventricle has mildly decreased function. The left ventricle demonstrates global hypokinesis. Left ventricular diastolic parameters were normal.  2. Right ventricular systolic function is normal. The right ventricular size is normal. There is mildly elevated pulmonary artery systolic pressure.  3. Cannot r/o PFO.  4. Right atrial size was mildly dilated.  5. The mitral valve is normal in structure. Mild mitral valve regurgitation. No evidence of mitral stenosis.  6. Tricuspid valve regurgitation is moderate.  7. The aortic valve is tricuspid. Aortic valve regurgitation is not visualized. No aortic stenosis is present.  8. The inferior vena cava is dilated in size with <50% respiratory variability, suggesting right atrial pressure of 15 mmHg. FINDINGS  Left Ventricle: Diffuse hypokinesis with abnormal septal motion. Left ventricular ejection fraction, by estimation, is 40 to 45%. The left ventricle has mildly decreased function. The  left ventricle demonstrates global hypokinesis. The left ventricular internal cavity size was normal in size. There is no left ventricular hypertrophy. Left ventricular diastolic parameters were normal. Right Ventricle: The right ventricular size is normal. No increase in right ventricular wall thickness. Right ventricular systolic function is normal. There is mildly elevated pulmonary artery systolic pressure. The tricuspid regurgitant velocity is 2.73  m/s, and with an assumed right atrial pressure of 10 mmHg, the estimated right ventricular systolic pressure is 94.8 mmHg. Left Atrium: Left atrial size was normal in size. Right Atrium: Right atrial size was mildly dilated. Pericardium: There is no evidence of pericardial effusion. Mitral Valve: The mitral valve is normal in structure. There is mild thickening of  the mitral valve leaflet(s). There is mild calcification of the mitral valve leaflet(s). Normal mobility of the mitral valve leaflets. Mild mitral valve regurgitation. No evidence of mitral valve stenosis. Tricuspid Valve: The tricuspid valve is normal in structure. Tricuspid valve regurgitation is moderate . No evidence of tricuspid stenosis. Aortic Valve: The aortic valve is tricuspid. Aortic valve regurgitation is not visualized. No aortic stenosis is present. Pulmonic Valve: The pulmonic valve was normal in structure. Pulmonic valve regurgitation is mild. No evidence of pulmonic stenosis. Aorta: The aortic root is normal in size and structure. Venous: The inferior vena cava is dilated in size with less than 50% respiratory variability, suggesting right atrial pressure of 15 mmHg. IAS/Shunts: The interatrial septum was not well visualized.  LEFT VENTRICLE PLAX 2D LVIDd:         4.64 cm     Diastology LVIDs:         3.40 cm     LV e' lateral:   6.85 cm/s LV PW:         0.86 cm     LV E/e' lateral: 9.6 LV IVS:        0.78 cm     LV e' medial:    6.42 cm/s LVOT diam:     2.00 cm     LV E/e' medial:  10.3  LV SV:         46 LV SV Index:   24 LVOT Area:     3.14 cm  LV Volumes (MOD) LV vol d, MOD A4C: 80.7 ml LV vol s, MOD A4C: 43.0 ml LV SV MOD A4C:     80.7 ml RIGHT VENTRICLE RV S prime:     7.94 cm/s TAPSE (M-mode): 2.1 cm LEFT ATRIUM             Index       RIGHT ATRIUM           Index LA diam:        3.70 cm 1.94 cm/m  RA Area:     19.70 cm LA Vol (A2C):   47.7 ml 25.07 ml/m RA Volume:   58.10 ml  30.53 ml/m LA Vol (A4C):   48.7 ml 25.59 ml/m LA Biplane Vol: 49.9 ml 26.22 ml/m  AORTIC VALVE LVOT Vmax:   65.00 cm/s LVOT Vmean:  42.900 cm/s LVOT VTI:    0.148 m  AORTA Ao Root diam: 3.00 cm Ao Asc diam:  3.30 cm MITRAL VALVE               TRICUSPID VALVE MV Area (PHT): 3.99 cm    TR Peak grad:   29.8 mmHg MV Decel Time: 190 msec    TR Vmax:        273.00 cm/s MV E velocity: 66.00 cm/s MV A velocity: 50.40 cm/s  SHUNTS MV E/A ratio:  1.31        Systemic VTI:  0.15 m                            Systemic Diam: 2.00 cm Jenkins Rouge MD Electronically signed by Jenkins Rouge MD Signature Date/Time: 06/30/2019/5:41:08 PM    Final    IR NEPHROSTOMY PLACEMENT LEFT  Result Date: 06/28/2019 INDICATION: 64 year old female with urosepsis secondary to proximal ureteral stone and obstructed pyonephrosis on the left. She is critically ill and too unstable for general anesthesia and retrograde ureteral stent placement. Therefore, she presents to interventional radiology  for percutaneous nephrostomy tube placement. EXAM: IR NEPHROSTOMY PLACEMENT LEFT COMPARISON:  CT abdomen/pelvis 06/27/2019 MEDICATIONS: Ancef 2 gm IV; The antibiotic was administered in an appropriate time frame prior to skin puncture. ANESTHESIA/SEDATION: Fentanyl 50 mcg IV; Versed 1 mg IV Moderate Sedation Time:  14 minutes The patient was continuously monitored during the procedure by the interventional radiology nurse under my direct supervision. CONTRAST:  56m OMNIPAQUE IOHEXOL 300 MG/ML SOLN - administered into the collecting system(s) FLUOROSCOPY  TIME:  Fluoroscopy Time: 2 minutes 36 seconds (36 mGy). COMPLICATIONS: None immediate. TECHNIQUE: The procedure, risks, benefits, and alternatives were explained to the patient. Questions regarding the procedure were encouraged and answered. The patient understands and consents to the procedure. The left flank was prepped with chlorhexidine in a sterile fashion, and a sterile drape was applied covering the operative field. A sterile gown and sterile gloves were used for the procedure. Local anesthesia was provided with 1% Lidocaine. The left flank was interrogated with ultrasound and the left kidney identified. The kidney is hydronephrotic. A suitable access site on the skin overlying the lower pole, posterior calix was identified. After local mg anesthesia was achieved, a small skin nick was made with an 11 blade scalpel. A 21 gauge Accustick needle was then advanced under direct sonographic guidance into the lower pole of the kidney. A 0.018 inch wire was advanced under fluoroscopic guidance into the left renal collecting system. The Accustick sheath was then advanced over the wire and a 0.018 system exchanged for a 0.035 system. Gentle hand injection of contrast material confirms placement of the sheath within the renal collecting system. There is mild hydronephrosis. The tract from the scan into the renal collecting system was then dilated serially to 10-French. A 10-French Cook all-purpose drain was then placed and positioned under fluoroscopic guidance. The locking loop is well formed within the left renal pelvis. The catheter was secured to the skin with 2-0 Prolene and a sterile bandage was placed. Catheter was left to gravity bag drainage. IMPRESSION: Successful placement of a left 10 French percutaneous nephrostomy tube. Electronically Signed   By: HJacqulynn CadetM.D.   On: 06/28/2019 16:06   VAS [place]OWER EXTREMITY VENOUS (DVT)  Result Date: 06/30/2019  Lower Venous DVTStudy Indications: Hypoxia,  COVID.  Comparison Study: no prior Performing Technologist: JJune LeapRDMS, RVT  Examination Guidelines: A complete evaluation includes B-mode imaging, spectral Doppler, color Doppler, and power Doppler as needed of all accessible portions of each vessel. Bilateral testing is considered an integral part of a complete examination. Limited examinations for reoccurring indications may be performed as noted. The reflux portion of the exam is performed with the patient in reverse Trendelenburg.  +---------+---------------+---------+-----------+----------+--------------+ RIGHT    CompressibilityPhasicitySpontaneityPropertiesThrombus Aging +---------+---------------+---------+-----------+----------+--------------+ CFV      Full           Yes      Yes                                 +---------+---------------+---------+-----------+----------+--------------+ SFJ      Full                                                        +---------+---------------+---------+-----------+----------+--------------+ FV Prox  Full                                                        +---------+---------------+---------+-----------+----------+--------------+  FV Mid   Full                                                        +---------+---------------+---------+-----------+----------+--------------+ FV DistalFull                                                        +---------+---------------+---------+-----------+----------+--------------+ PFV      Full                                                        +---------+---------------+---------+-----------+----------+--------------+ POP      Full           Yes      Yes                                 +---------+---------------+---------+-----------+----------+--------------+ PTV      Full                                                        +---------+---------------+---------+-----------+----------+--------------+ PERO      Full                                                        +---------+---------------+---------+-----------+----------+--------------+   +---------+---------------+---------+-----------+----------+--------------+ LEFT     CompressibilityPhasicitySpontaneityPropertiesThrombus Aging +---------+---------------+---------+-----------+----------+--------------+ CFV      Full           Yes      Yes                                 +---------+---------------+---------+-----------+----------+--------------+ SFJ      Full                                                        +---------+---------------+---------+-----------+----------+--------------+ FV Prox  Full                                                        +---------+---------------+---------+-----------+----------+--------------+ FV Mid   Full                                                        +---------+---------------+---------+-----------+----------+--------------+  FV DistalFull                                                        +---------+---------------+---------+-----------+----------+--------------+ PFV      Full                                                        +---------+---------------+---------+-----------+----------+--------------+ POP      Full           Yes      Yes                                 +---------+---------------+---------+-----------+----------+--------------+ PTV      Full                                                        +---------+---------------+---------+-----------+----------+--------------+ PERO     Full                                                        +---------+---------------+---------+-----------+----------+--------------+     Summary: RIGHT: - There is no evidence of deep vein thrombosis in the lower extremity.  - No cystic structure found in the popliteal fossa.  LEFT: - There is no evidence of deep vein thrombosis in the  lower extremity.  - No cystic structure found in the popliteal fossa.  *See table(s) above for measurements and observations. Electronically signed by Harold Barban MD on 06/30/2019 at 5:08:42 PM.    Final    US Abdomen Limited RUQ  Result Date: 06/27/2019 CLINICAL DATA:  Right upper quadrant pain. EXAM: ULTRASOUND ABDOMEN LIMITED RIGHT UPPER QUADRANT COMPARISON:  None. FINDINGS: Gallbladder: Multiple gallstones evident, measuring up to 3.3 cm diameter. Gallbladder wall thickness upper normal at 3 mm. No pericholecystic fluid. Sonographer reports no sonographic Murphy sign. Common bile duct: Diameter: 8 mm common duct diameter in the hepatoduodenal ligament. Liver: No focal abnormality evident with subtle increase in parenchymal echotexture suggesting component of fatty deposition. Portal vein is patent on color Doppler imaging with normal direction of blood flow towards the liver. Other: None. IMPRESSION: Cholelithiasis with mild extrahepatic biliary duct distension. Correlation with liver function test recommended. Gallbladder wall thickness upper normal without pericholecystic fluid or sonographic Murphy sign. Electronically Signed   By: Misty Stanley M.D.   On: 06/27/2019 17:07    ASSESSMENT AND PLAN: 1.  Leukocytosis 2.  Mild normocytic anemia 3.  Thrombocytopenia, slowly improving 4.  E. coli bacteremia and sepsis 5.  Left hydronephrosis 6.  AKI, likely ATN from sepsis/shock improving 7.  COVID-19 positive, status post remdesivir and currently receiving dexamethasone 8.  Epilepsy  -The patient has significant leukocytosis, mild normocytic anemia, and thrombocytopenia with platelets that are trending upward.  Her WBC, hemoglobin, and platelet  count were normal on the day prior to admission. -The elevation in WBC is in part reactive and due to infection and dexamethasone.  However, her WBC is significantly more elevated than expected and I am not sure this completely explains her  leukocytosis. -We will review peripheral blood smear.  May need to consider additional studies such as a BCR-abl to evaluate for CML.  If WBC not improving, may need to consider bone marrow biopsy. -Anemia likely dilutional and also due to AKI.  Hemoglobin improving.  Monitor for now. -Thrombocytopenia likely due to infection/resolving sepsis.  Platelet count is trending upward.  Monitor for now. -Continue CRRT per Nephrology.  Thank you for this referral.  Mikey Bussing, DNP, AGPCNP-BC, AOCNP Mon/Tues/Thurs/Fri 7am-5pm; Off Wednesdays Cell: [phone]  ADDENDUM: I must say that Amber Bowman is an incredibly fascinating.  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  She saw a lot of concerts.  We talked about all the concerts that she saw.  I find it very interesting that on April 19, she had an absolutely normal blood count.  Then, her platelet count dropped in 1 day from 280 down to 89.  I will know she was on heparin at that time her white.  She was found to have E. coli in her blood.  I would have to think that she has a thrombocytopenia that is consumptive from the toxins produced by a gram-negative rod.  Her platelet count has decreased and is subsequently increased.  Her white cell count has trended upward very quickly.  In 2 days her white cell count went from 4.2 up to 20.2.  It then rapidly went up to level 102,000.  This was today.  Her hemoglobin is 11.9.  Her platelet count is 72,000.  I think she is on dialysis right now.  She has a nephrostomy tube in place.  She has a large calculus in the left kidney.  She has a large gallstone in the gallbladder neck.  She has a G-tube in place.  She has bilateral pleural effusions.  In looking at her blood smear, her platelets are quite large.  This would go along with a consumptive process.  Of note, on a CT scan she does not have a large spleen.  Her blood smear is almost consistent with somebody who has CML.   However I would figure that given that she had a normal white cell count when she came in, that there is no way that she could have CML.  I had to believe that this is a very brisk leukemoid reaction.  I have to believe that her white cell count will trend downward with treatment of her infections.  Look for her platelet count is slowly trending upward which would be a good sign.  When I examined her I cannot palpate her spleen.  There is no lymphadenopathy.  We really do see this type of leukemoid reaction.  Of note she is on Decadron.  I am sure this is contributing to the white cell count elevation.  As long she is on Decadron, her white cell count may be slow in coming down.  I think that she just has a very brisk demargination of her leukocytes from the bone marrow and that she is putting out immature white blood cells.  I do not see any blasts on the blood smear.  She did have some nucleated red blood cells.  I saw some erythroblasts which would go along with this  demargination.  Again, I would just watch Amber Bowman and see how the white cell count trends.  I really do not see the need for a bone marrow biopsy.  I do not think we have to send off genetic test for [facility] or a myeloproliferative disorder.  We will follow along.  Again, it was incredibly fun talking with her.  She is very very nice.  Lattie Haw, MD  Penelope Coop 6:9-10

## 2019-07-03 NOTE — Progress Notes (Addendum)
Interventional Radiology Brief Note:  Called to unit to follow-up L PCN placement from last week.  Per RN, tube appears intact.  Insertion site clean and dry.  Patient with UOP from L PCN, little urine in foley. No hematuria. -405 mL from L PCN -15 mL from foley  Reviewed CT from yesterday with Dr. Earleen Newport. L PCN in good position.  No obstructing stone or hydronephrosis noted on the right to explain presume poor right kidney urine production. L side well decompressed.   No issues or concerns related to PCN at this time.  Continue current management.   Brynda Greathouse, MS RD PA-C 2:09 PM

## 2019-07-03 NOTE — Progress Notes (Signed)
Patient ID: Amber Bowman, female   DOB: 1955-06-20, 64 y.o.   MRN: [id number]    Subjective: Events from weekend reviewed.  She still remains critically ill although now with some urine output.  ID recommendations for ampicillin for treatment of E coli bacteremia.  Objective: Vital signs in last 24 hours: Temp:  [98 F (36.7 C)-100 F (37.8 C)] 100 F (37.8 C) (04/26 0800) Pulse Rate:  [81-110] 98 (04/26 0925) Resp:  [14-24] 19 (04/26 0925) SpO2:  [91 %-100 %] 96 % (04/26 0925) Arterial Line BP: (96-145)/(45-60) 96/54 (04/26 0925)  Intake/Output from previous day: 04/25 0701 - 04/26 0700 In: 2667.7 [P.O.:180; I.V.:825.8; NG/GT:1255; IV Piggyback:351.9] Out: 5991 [Urine:420] Intake/Output this shift: Total I/O In: 278 [I.V.:128; NG/GT:150] Out: 109 [Other:109]   Lab Results: Recent Labs    07/01/19 0419 07/02/19 0400 07/03/19 0441  HGB 10.0* 11.1* 11.9*  HCT 29.4* 33.8* 34.8*   CBC Latest Ref Rng & Units 07/03/2019 07/02/2019 07/01/2019  WBC 4.0 - 10.5 K/uL 102.0(HH) 90.8(HH) 63.6(HH)  Hemoglobin 12.0 - 15.0 g/dL 11.9(L) 11.1(L) 10.0(L)  Hematocrit 36.0 - 46.0 % 34.8(L) 33.8(L) 29.4(L)  Platelets 150 - 400 K/uL 72(L) 58(L) 39(L)      BMET Recent Labs    07/02/19 1524 07/03/19 0441  NA 132* 134*  K 3.6 4.2  CL 100 101  CO2 25 26  GLUCOSE 135* 141*  BUN 31* 32*  CREATININE 1.37* 1.37*  CALCIUM 7.8* 8.1*     Studies/Results: CT ABDOMEN PELVIS WO CONTRAST  Result Date: 07/02/2019 CLINICAL DATA:  Abdominal pain, acute with increased white blood cell count. EXAM: CT ABDOMEN AND PELVIS WITHOUT CONTRAST TECHNIQUE: Multidetector CT imaging of the abdomen and pelvis was performed following the standard protocol without IV contrast. COMPARISON:  06/27/2019 FINDINGS: Lower chest: Enlargement of bilateral pleural effusions. Areas of patchy airspace and interstitial opacities abnormality since the previous exam in areas of aerated lung. Moderate basilar volume loss  bilaterally. Hepatobiliary: Large gallstone in the gallbladder neck. Gallbladder is less distended than on the prior study. Trace pericholecystic fluid. No pericholecystic stranding. Liver is lobular without focal lesion on noncontrast imaging. No gross biliary ductal dilation. Pancreas: Pancreas is normal. No signs of inflammation, ductal dilation or lesion. Spleen: Spleen is normal size. No focal lesion. Adrenals/Urinary Tract: Adrenal glands are normal. Nephrolithiasis in the RIGHT kidney with similar appearance to the prior study. LEFT-sided nephrostomy tube remains in place. Moderate sized calculus that was present in the posterior LEFT interpolar calyx on the prior study measuring 4-5 mm has migrated into the renal pelvis near the UPJ just proximal to the dominant calculus. Stomach/Bowel: Moderate size hiatal hernia. Gastric tube in situ, tube terminates in the duodenal bulb, tenting the duodenal bulb. No perigastric stranding. Colonic diverticulosis. Normal appendix. Vascular/Lymphatic: Calcified atheromatous plaque in the abdominal aorta. No aneurysm. No adenopathy in the retroperitoneum or in the upper abdomen. No adenopathy in the pelvis. Reproductive: Unremarkable by CT. Other: Mild body wall edema. Musculoskeletal: No acute bone finding or destructive bone process. Levoconvex curvature of the lumbar spine with similar appearance associated with degenerative change. IMPRESSION: 1. Enlargement of bilateral pleural effusions and patchy airspace and interstitial opacities since the previous study in areas of aerated lung. Findings could represent pneumonitis/infection, correlate with respiratory symptoms. 2. LEFT-sided nephrostomy tube remains in place. Moderate sized calculus that was present in the posterior LEFT interpolar calyx on the prior study measuring 4-5 mm has migrated into the renal pelvis near the UPJ just proximal to the  dominant calculus. No hydronephrosis. 3. Large gallstone in the  gallbladder neck. Gallbladder is less distended than on the prior study. Trace pericholecystic fluid. No pericholecystic stranding. Correlate with any symptoms or laboratory values that would suggest biliary pathology with HIDA scan as warranted. 4. Gastric tube in situ, tube terminates in the duodenal bulb, tenting the duodenal bulb. Suggest retraction into the stomach, approximately 5-6 cm retraction. 5. Colonic diverticulosis without evidence of acute diverticulitis. 6. Aortic atherosclerosis. These results will be called to the ordering clinician or representative by the Radiologist Assistant, and communication documented in the PACS or [employer]. Aortic Atherosclerosis (ICD10-I70.0). Electronically Signed   By: Zetta Bills M.D.   On: 07/02/2019 11:39   DG Abd 1 View  Result Date: 07/02/2019 CLINICAL DATA:  Evaluate NG tube EXAM: ABDOMEN - 1 VIEW COMPARISON:  None. FINDINGS: The distal tip of the NG tube is likely near the junction of the pylorus and proximal duodenum. A left nephrostomy tube is identified. Bilateral renal stones are noted. IMPRESSION: The NG tube is stable. I suspect the distal tip is within the duodenal bulb near the junction of the pylorus and duodenum. Electronically Signed   By: Dorise Bullion III M.D   On: 07/02/2019 15:31   DG Chest Port 1 View  Result Date: 07/02/2019 CLINICAL DATA:  64 year old female with history of respiratory failure. EXAM: PORTABLE CHEST 1 VIEW COMPARISON:  Chest x-ray 07/01/2019. FINDINGS: There is a right-sided internal jugular central venous catheter with tip terminating in the mid superior vena cava. A nasogastric tube is seen extending into the stomach, however, the tip of the nasogastric tube extends below the lower margin of the image. Lung volumes are normal. There continues to be patchy ill-defined areas of airspace consolidation and interstitial prominence scattered throughout the lungs bilaterally, but overall aeration has significantly  improved compared to yesterday's examination. No definite pleural effusions. Heart size is normal. Upper mediastinal contours are within normal limits. Aortic atherosclerosis. IMPRESSION: 1. Support apparatus, as above. 2. Improving aeration throughout the lungs bilaterally which could reflect resolving edema and/or multilobar pneumonia. 3. Aortic atherosclerosis. Electronically Signed   By: Vinnie Langton M.D.   On: 07/02/2019 06:49    Assessment/Plan: 1) Left ureteral calculus: Continue left nephrostomy drainage and antibiotics (per ID recommendations).  Plan to defer any treatment of stone until acute medical issues resolved.  Will continue to periodically follow during hospitalization to arrange outpatient follow up plan when appropriate.   LOS: 6 days   Dutch Gray 07/03/2019, 10:02 AM

## 2019-07-03 NOTE — Progress Notes (Signed)
ANTICOAGULATION CONSULT NOTE - Follow Up Consult  Pharmacy Consult for Heparin IV Indication: Elevated D-dimer with COVID  No Known Allergies  Patient Measurements: Height: 5\' 4"  (162.6 cm) Weight: 82 kg (180 lb 12.4 oz) IBW/kg (Calculated) : 54.7 Heparin Dosing Weight: 70.5  Vital Signs: Temp: 99.1 F (37.3 C) (04/26 0400) Temp Source: Axillary (04/26 0400) Pulse Rate: 95 (04/26 0600)  Labs: Recent Labs    06/30/19 0920 06/30/19 1529 06/30/19 2036 07/01/19 0419 07/01/19 0500 07/01/19 1618 07/02/19 0400 07/02/19 1524 07/03/19 0441 07/03/19 0442  HGB  --   --    < > 10.0*  --   --  11.1*  --  11.9*  --   HCT  --   --   --  29.4*  --   --  33.8*  --  34.8*  --   PLT 43*  --   --  39*  --   --  58*  --  72*  --   APTT 67*  --   --  84*  --   --  60*  --  89*  --   LABPROT 16.0*  --   --   --   --   --   --   --   --   --   INR 1.3*  --   --   --   --   --   --   --   --   --   HEPARINUNFRC 0.15*   < >   < >  --  0.32  --  0.31  --   --  0.65  CREATININE  --    < >  --  1.51*  --    < > 1.27* 1.37* 1.37*  --    < > = values in this interval not displayed.    Estimated Creatinine Clearance: 43 mL/min (A) (by C-G formula based on SCr of 1.37 mg/dL (H)).   Medications:  Infusions:  .  prismasol BGK 4/2.5 400 mL/hr at 07/02/19 1413  .  prismasol BGK 4/2.5 200 mL/hr at 07/02/19 1408  . sodium chloride    . sodium chloride Stopped (07/01/19 0009)  . cefTRIAXone (ROCEPHIN)  IV Stopped (07/02/19 2347)  . dextrose 20 mL/hr at 07/02/19 1718  . feeding supplement (VITAL 1.5 CAL) 1,000 mL (07/02/19 1309)  . heparin 1,400 Units/hr (07/02/19 1712)  . prismasol BGK 4/2.5 1,800 mL/hr at 07/02/19 1841    Assessment: 10 yoF admitted on 4/20 with shock, pyelonephritis, ARF, COVID-19.  Pt initially started on only SCDs for VTE px due to new onset thrombocytopenia 4/20.  Pharmacy consulted for heparin drip on 4/21 for "Elevated D-dimer with COVID, would like heparin drip without a  bolus please".   4/23 LE Dopplers: negative for DVT   Today, 07/03/2019: Heparin level 0.65, therapeutic but increased on heparin at 1400 units/hr CBC: Hgb stable, 11.9, Plt improving to 72k No bleeding or infusion related issues noted by nursing Continues on CRRT - poor function overnight per RN (constantly having to restart) but filter changed 4/26 AM and now seems to be working better. D-dimer decreasing:  > 20, 18, 12, 19, 4.8   Goal of Therapy:  Heparin level 0.3-0.7 units/ml Monitor platelets by anticoagulation protocol: Yes   Plan:  Continue heparin IV infusion at 1400 units/hr Recheck Heparin level in 8 hours to confirm levels Daily heparin level and CBC Continue to monitor H&H and platelets F/u indication, duration of heparin infusion.  Gretta Arab PharmD, BCPS Clinical Pharmacist [facility] main pharmacy [phone] 07/03/2019 7:52 AM

## 2019-07-04 DIAGNOSIS — N179 Acute kidney failure, unspecified: Secondary | ICD-10-CM | POA: Diagnosis not present

## 2019-07-04 LAB — CBC WITH DIFFERENTIAL/PLATELET
Abs Immature Granulocytes: 16.01 10*3/uL — ABNORMAL HIGH (ref 0.00–0.07)
Basophils Absolute: 0.1 10*3/uL (ref 0.0–0.1)
Basophils Relative: 0 %
Eosinophils Absolute: 0.3 10*3/uL (ref 0.0–0.5)
Eosinophils Relative: 0 %
HCT: 29.8 % — ABNORMAL LOW (ref 36.0–46.0)
Hemoglobin: 9.8 g/dL — ABNORMAL LOW (ref 12.0–15.0)
Immature Granulocytes: 17 %
Lymphocytes Relative: 7 %
Lymphs Abs: 6.7 10*3/uL — ABNORMAL HIGH (ref 0.7–4.0)
MCH: 30.9 pg (ref 26.0–34.0)
MCHC: 32.9 g/dL (ref 30.0–36.0)
MCV: 94 fL (ref 80.0–100.0)
Monocytes Absolute: 6.5 10*3/uL — ABNORMAL HIGH (ref 0.1–1.0)
Monocytes Relative: 7 %
Neutro Abs: 63.1 10*3/uL — ABNORMAL HIGH (ref 1.7–7.7)
Neutrophils Relative %: 69 %
Platelets: 83 10*3/uL — ABNORMAL LOW (ref 150–400)
RBC: 3.17 MIL/uL — ABNORMAL LOW (ref 3.87–5.11)
RDW: 15.2 % (ref 11.5–15.5)
WBC: 92.6 10*3/uL (ref 4.0–10.5)
nRBC: 0.5 % — ABNORMAL HIGH (ref 0.0–0.2)

## 2019-07-04 LAB — POCT ACTIVATED CLOTTING TIME
Activated Clotting Time: 125 seconds
Activated Clotting Time: 142 seconds
Activated Clotting Time: 153 seconds
Activated Clotting Time: 142 seconds
Activated Clotting Time: 158 seconds

## 2019-07-04 LAB — GLUCOSE, CAPILLARY
Glucose-Capillary: 106 mg/dL — ABNORMAL HIGH (ref 70–99)
Glucose-Capillary: 115 mg/dL — ABNORMAL HIGH (ref 70–99)
Glucose-Capillary: 128 mg/dL — ABNORMAL HIGH (ref 70–99)
Glucose-Capillary: 124 mg/dL — ABNORMAL HIGH (ref 70–99)
Glucose-Capillary: 85 mg/dL (ref 70–99)
Glucose-Capillary: 107 mg/dL — ABNORMAL HIGH (ref 70–99)

## 2019-07-04 LAB — RENAL FUNCTION PANEL
Albumin: 2 g/dL — ABNORMAL LOW (ref 3.5–5.0)
Albumin: 2.1 g/dL — ABNORMAL LOW (ref 3.5–5.0)
Anion gap: 8 (ref 5–15)
Anion gap: 8 (ref 5–15)
BUN: 33 mg/dL — ABNORMAL HIGH (ref 8–23)
BUN: 30 mg/dL — ABNORMAL HIGH (ref 8–23)
CO2: 25 mmol/L (ref 22–32)
CO2: 25 mmol/L (ref 22–32)
Calcium: 7.5 mg/dL — ABNORMAL LOW (ref 8.9–10.3)
Calcium: 7.6 mg/dL — ABNORMAL LOW (ref 8.9–10.3)
Chloride: 104 mmol/L (ref 98–111)
Chloride: 101 mmol/L (ref 98–111)
Creatinine, Ser: 1.32 mg/dL — ABNORMAL HIGH (ref 0.44–1.00)
Creatinine, Ser: 1.21 mg/dL — ABNORMAL HIGH (ref 0.44–1.00)
GFR calc Af Amer: 49 mL/min — ABNORMAL LOW
GFR calc Af Amer: 55 mL/min — ABNORMAL LOW (ref >60)
GFR calc non Af Amer: 43 mL/min — ABNORMAL LOW
GFR calc non Af Amer: 47 mL/min — ABNORMAL LOW (ref >60)
Glucose, Bld: 146 mg/dL — ABNORMAL HIGH (ref 70–99)
Glucose, Bld: 128 mg/dL — ABNORMAL HIGH (ref 70–99)
Phosphorus: 2.1 mg/dL — ABNORMAL LOW (ref 2.5–4.6)
Phosphorus: 2.5 mg/dL (ref 2.5–4.6)
Potassium: 4.5 mmol/L (ref 3.5–5.1)
Potassium: 4.2 mmol/L (ref 3.5–5.1)
Sodium: 137 mmol/L (ref 135–145)
Sodium: 134 mmol/L — ABNORMAL LOW (ref 135–145)

## 2019-07-04 LAB — APTT: aPTT: 30 seconds (ref 24–36)

## 2019-07-04 MED ORDER — PHENYLEPHRINE CONCENTRATED 100MG/250ML (0.4 MG/ML) INFUSION SIMPLE
0.0000 ug/min | INTRAVENOUS | Status: DC
  Administered 2019-07-04: 35 ug/min via INTRAVENOUS
  Filled 2019-07-04 (×2): qty 250

## 2019-07-04 MED ORDER — PHENYLEPHRINE HCL-NACL 10-0.9 MG/250ML-% IV SOLN
0.0000 ug/min | INTRAVENOUS | Status: DC
  Administered 2019-07-04: 25 ug/min via INTRAVENOUS
  Filled 2019-07-04: qty 250

## 2019-07-04 MED ORDER — HEPARIN SODIUM (PORCINE) 5000 UNIT/ML IJ SOLN
5000.0000 [IU] | Freq: Three times a day (TID) | INTRAMUSCULAR | Status: DC
  Administered 2019-07-04 – 2019-07-11 (×20): 5000 [IU] via SUBCUTANEOUS
  Filled 2019-07-04 (×19): qty 1

## 2019-07-04 MED ORDER — PHENYLEPHRINE HCL-NACL 10-0.9 MG/250ML-% IV SOLN
INTRAVENOUS | Status: AC
  Administered 2019-07-04: 20 ug/min via INTRAVENOUS
  Filled 2019-07-04: qty 250

## 2019-07-04 MED ORDER — MAGIC MOUTHWASH W/LIDOCAINE
10.0000 mL | Freq: Three times a day (TID) | ORAL | Status: DC | PRN
  Administered 2019-07-04 – 2019-07-11 (×16): 10 mL via ORAL
  Filled 2019-07-04 (×18): qty 10

## 2019-07-04 NOTE — Progress Notes (Signed)
Per Dr. Posey Pronto, do not check ACT levels or titrate heparin rate. Leave rate at current 2.1 mL/hour. Contact MD if machine begins to clot again

## 2019-07-04 NOTE — Progress Notes (Signed)
It looks like her blood counts are starting to come down a little bit.  Hopefully this is indicative of successful treatment of this E. coli bacteremia/sepsis along with the Covid virus.  Her CBC shows white cell count 93,000.  Hemoglobin 9.8 and platelet count 83,000.  I suspect that her white cell count she will trend down gradually.  I must say that she must have a fairly healthy bone marrow to be able to mount such a leukemoid response.  We should continue to follow along.  I still do not see any indication that she has a underlying myeloproliferative neoplasm nor does she need any type of bone marrow biopsy.  Lattie Haw, MD  Hebrews 6:10

## 2019-07-04 NOTE — Progress Notes (Signed)
Patient ID: Amber Bowman, female   DOB: [birth year], 64 y.o.   MRN: [id number] [facility] Progress Note   Assessment/ Plan:   1. Acute kidney Injury: likely ATN from sepsis/shock and a component of urinary obstruction- s/p left percutaneous nephrostomy tube placement on 4/22.  She is nonoliguric with 458 cc urine output overnight (predominantly from nephrostomy tube) and the plan is to continue CRRT at the current prescription while keeping her net even for management of azotemia; she remains on pressors and these are actively being weaned.  2. Volume overload: secondary to AKI/fluid accumulation; tolerating CRRT at this time and attempting to keep her net even given pressor dependent hypotension.  3. E. Coli bacteremia and sepsis- likely of urinary source and on ampicillin at this time for focused coverage--seen by hematology as well who feel that this is likely a leukemoid reaction from ongoing corticosteroids (additional work-up initiated to evaluate for CML) 4. Left ureteral calculus with hydronephrosis: s/p PCN with patency of drainage tube confirmed by radiology yesterday. 5. Seizure disorder: Remains seizure-free and will likely need revision of antiepileptic drug therapy to try and get her off of Topamax given risk for recurrent nephrolithiasis.   Subjective:   CRRT system clotted early this morning and now on heparin for circuit anticoagulation.  Objective:   BP (!) 101/44   Pulse 84   Temp 98.8 F (37.1 C) (Axillary)   Resp 14   Ht 5\' 4"  (1.626 m)   Wt 73.9 kg   LMP 02/11/2008   SpO2 98%   BMI 27.97 kg/m   Intake/Output Summary (Last 24 hours) at 07/04/2019 1236 Last data filed at 07/04/2019 1200 Gross per 24 hour  Intake 2719.52 ml  Output 2829 ml  Net -109.48 ml   Weight change:   Physical Exam: [id number] comfortable resting in bed, RN at bedside.  NG tube in situ [id number] regular rhythm, normal rate, S1 and S2 normal  Resp:Clear to auscultation, no  rales/rhonchi [id number], obese, with left sided nephrostomy tube in place Ext: Trace to 1+ right lower extremity edema, trace left lower extremity edema  Imaging: DG Abd 1 View  Result Date: 07/02/2019 CLINICAL DATA:  Evaluate NG tube EXAM: ABDOMEN - 1 VIEW COMPARISON:  None. FINDINGS: The distal tip of the NG tube is likely near the junction of the pylorus and proximal duodenum. A left nephrostomy tube is identified. Bilateral renal stones are noted. IMPRESSION: The NG tube is stable. I suspect the distal tip is within the duodenal bulb near the junction of the pylorus and duodenum. Electronically Signed   By: Dorise Bullion III M.D   On: 07/02/2019 15:31    Labs: BMET Recent Labs  Lab 07/01/19 0419 07/01/19 1618 07/02/19 0400 07/02/19 1524 07/03/19 0441 07/03/19 1600 07/04/19 0500  NA 135 135 135 132* 134* 138 137  K 4.2 3.8 4.2 3.6 4.2 4.3 4.5  CL 100 102 102 100 101 104 104  CO2 26 26 26 25 26 27 25   GLUCOSE 120* 101* 132* 135* 141* 136* 146*  BUN 31* 29* 30* 31* 32* 35* 33*  CREATININE 1.51* 1.25* 1.27* 1.37* 1.37* 1.45* 1.32*  CALCIUM 7.6* 7.9* 7.9* 7.8* 8.1* 7.8* 7.5*  PHOS 2.1* 1.2* 1.3* 1.5* 1.5* 1.9* 2.1*   CBC Recent Labs  Lab 07/01/19 0419 07/02/19 0400 07/03/19 0441 07/04/19 0500  WBC 63.6* 90.8* 102.0* 92.6*  NEUTROABS 45.8* 74.5* 67.3* 63.1*  HGB 10.0* 11.1* 11.9* 9.8*  HCT 29.4* 33.8* 34.8* 29.8*  MCV 93.0  92.1 91.3 94.0  PLT 39* 58* 72* 83*    Medications:    . Chlorhexidine Gluconate Cloth  6 each Topical Daily  . feeding supplement (PRO-STAT SUGAR FREE 64)  30 mL Per Tube BID  . felbamate  300 mg Per Tube Q12H  . heparin injection (subcutaneous)  5,000 Units Subcutaneous Q8H  . insulin aspart  0-9 Units Subcutaneous Q4H  . levETIRAcetam  500 mg Per Tube BID  . mouth rinse  15 mL Mouth Rinse BID  . midodrine  10 mg Oral TID WC  . multivitamin  15 mL Per Tube Daily  . pantoprazole sodium  40 mg Per Tube QHS  . sodium chloride flush  10-40 mL  Intracatheter Q12H  . topiramate  200 mg Per Tube BID   Elmarie Shiley, MD 07/04/2019, 12:36 PM

## 2019-07-04 NOTE — Progress Notes (Signed)
CRRT restarted w/ heparin in syringe d/t excessive filter clotting.   No ACTs values taken until morning shift  Nephrology on call consulted & aware

## 2019-07-04 NOTE — Progress Notes (Addendum)
Notified Dr. Lake Bells in regards to new painful blisters on pt tongue. Magic mouthwash ordered and given. Relief was effective.   Also notified MD of pt increasing need for pressors, even when the fluid removal rate on CRRT is decreased. MD okay with titrating pressors based on keeping systolic above 90

## 2019-07-04 NOTE — Progress Notes (Signed)
NAME:  Amber Bowman, MRN:  [id number], DOB:  10-Jan-1956, LOS: 7 ADMISSION DATE:  06/27/2019, CONSULTATION DATE:  4/20 REFERRING MD:  Kathrynn Humble, CHIEF COMPLAINT:  Nausea/vomiting, pain   Brief History   64 y/o female admitted with septic shock due to obstructive uropathy/pyelonephritis, also found to have COVID 19.  Developed AKI leading to CRRT.  Past Medical History  Colon polyps, Depression, Epilepsy, Osteopenia  Significant Hospital Events   4/20 Admit 4/21 percutaneous placement of nephrostomy tube by IR 4/22 start CRRT 4/23 off pressors  Consults:   Surgery gallstones 4/20  Nephrology AKI 4/21  Urology obstructive uropathy 4/21  ID diarrhea/leukocytosis 4/25   Hematology leukocytosis 4/26  Procedures:  R IJ HD cath 4/21 >   Significant Diagnostic Tests:   RUQ abd u/s 4/20 >> cholelithiasis with mild CBD distention, negative Murphy sign  CT chest 4/20 >> atherosclerosis, small hiatal hernia, small effusions  CT abd/pelvis 4/20 >> cholelithiasis, 10 mm stone Lt proximal ureter with mild hydronephrosis  Renal u/s 4/23 >> b/l renal calculi  Echo 4/23 >> EF 40 to 45%, can r/o PFO, mod TR  Doppler legs b/l 4/23 >> no DVT  CT abd/pelvis 4/25 >> b/l effusions, patchy ASD, large gallstone at GB neck, b/l renal stones  Micro Data:  SARS CoV2 Ag 4/20 >> positive Blood 4/20 >> E coli Urine 4/21 >> negative Legionella Ab 4/21 >> negative Pneumococcal Ag 4/21 >> negative C diff 4/25 >> negative Blood 4/25 >>   Antimicrobials:  Rocephin 4/20 >> 4/26 Ampicillin 4/26 >>  Remdesivir 4/20 >> 4/24 Dexamethasone 4/20 >>   Interim history/subjective:  WBC down PLT up Hgb down Wants to eat Wants to get out of bed  Objective   Blood pressure (!) 89/56, pulse 86, temperature 98.9 F (37.2 C), temperature source Axillary, resp. rate 18, height 5\' 4"  (1.626 m), weight 73.9 kg, last menstrual period 02/11/2008, SpO2 97 %.        Intake/Output Summary (Last 24  hours) at 07/04/2019 0720 Last data filed at 07/04/2019 0700 Gross per 24 hour  Intake 2591.44 ml  Output 2602 ml  Net -10.56 ml   Filed Weights   07/01/19 0500 07/02/19 0431 07/04/19 0500  Weight: 82.7 kg 82 kg 73.9 kg    Examination: General:  Generally very weak, resting comfortably in bed HENT: NCAT OP clear PULM: CTA B, normal effort CV: RRR, no mgr GI: BS+, soft, nontender, no Murphy's sign MSK: normal bulk and tone Neuro: awake, alert, no distress, MAEW   Resolved Hospital Problem list     Assessment & Plan:  Septic shock from E coli bacteremia, nephrolithiasis, and left hydronephrosis, s/p percutaneous nephrostomy > remove arterial line > wean off neosynephrine for MAP > 65 > monitor  Hemodynamics > continue midodrine  AKI from ATN : now making urine, hopeful for recovery of renal function > continue CRRT > Monitor BMET and UOP > Replace electrolytes as needed > heparin added to circuit  Leukocytosis> improved, uncertain etiology, suspect leukemoid reaction vs less likely COVID related; appreciate hematology input Thrombocytopenia > improving > monitor for bleeding > stop decadron > add back sub cutaneous heparin  COVID 19 without pneumonia, s/p remdesivir > stop decadron  Cholelithiasis > surgery monitoring, no gallbladder swelling, no wall thickening, no sign of acute cholecystitis > will need outpatient general surgery follow up  Epilepsy > continue keppra and topamax  Best practice:  Diet: tube feeding> advance to clears today Pain/Anxiety/Delirium protocol (if indicated): n/a VAP  protocol (if indicated): n/a DVT prophylaxis: add back sub q heparin GI prophylaxis: PPI Glucose control: SSI Mobility: out of bed Code Status: full Family Communication: Updated Billy her significant other by phone Disposition: remain in ICU  Labs   CBC: Recent Labs  Lab 06/30/19 0417 06/30/19 0417 06/30/19 0920 07/01/19 0419 07/02/19 0400 07/03/19 0441  07/04/19 0500  WBC 40.2*  --   --  63.6* 90.8* 102.0* 92.6*  NEUTROABS 33.8*  --   --  45.8* 74.5* 67.3* 63.1*  HGB 9.9*  --   --  10.0* 11.1* 11.9* 9.8*  HCT 28.7*  --   --  29.4* 33.8* 34.8* 29.8*  MCV 92.9  --   --  93.0 92.1 91.3 94.0  PLT 48*   < > 43* 39* 58* 72* 83*   < > = values in this interval not displayed.    Basic Metabolic Panel: Recent Labs  Lab 06/29/19 0353 06/29/19 0353 06/29/19 1600 06/29/19 1600 06/30/19 0417 06/30/19 1529 07/01/19 0419 07/01/19 1618 07/02/19 0400 07/02/19 1524 07/03/19 0441 07/03/19 1600 07/04/19 0500  NA 138   < > 136   < > 137   < > 135   < > 135 132* 134* 138 137  K 3.9   < > 3.8  3.8   < > 4.1   < > 4.2   < > 4.2 3.6 4.2 4.3 4.5  CL 102   < > 104   < > 102   < > 100   < > 102 100 101 104 104  CO2 17*   < > 20*   < > 22   < > 26   < > 26 25 26 27 25   GLUCOSE 131*   < > 139*   < > 150*   < > 120*   < > 132* 135* 141* 136* 146*  BUN 58*   < > 46*   < > 40*   < > 31*   < > 30* 31* 32* 35* 33*  CREATININE 4.69*   < > 3.38*   < > 2.34*   < > 1.51*   < > 1.27* 1.37* 1.37* 1.45* 1.32*  CALCIUM 6.7*   < > 6.9*   < > 7.6*   < > 7.6*   < > 7.9* 7.8* 8.1* 7.8* 7.5*  MG 1.3*  --  2.7*  --  2.5*  --  2.5*  --  2.5*  --   --   --   --   PHOS 4.6   < > 3.4  3.4   < > 2.2*   < > 2.1*   < > 1.3* 1.5* 1.5* 1.9* 2.1*   < > = values in this interval not displayed.   GFR: Estimated Creatinine Clearance: 42.4 mL/min (A) (by C-G formula based on SCr of 1.32 mg/dL (H)). Recent Labs  Lab 06/27/19 1349 06/27/19 1356 06/27/19 1620 06/27/19 1904 06/28/19 0549 06/28/19 0549 06/29/19 0353 06/30/19 0417 07/01/19 0419 07/02/19 0400 07/03/19 0441 07/04/19 0500  PROCALCITON  --   --   --  83.55 71.36  --  <0.10  --   --   --   --   --   WBC   < >  --   --   --  20.2*   < > 33.5*   < > 63.6* 90.8* 102.0* 92.6*  LATICACIDVEN  --  4.6* 5.0*  --  4.5*  --   --   --   --   --   --   --    < > =  values in this interval not displayed.    Liver Function  Tests: Recent Labs  Lab 06/29/19 0353 06/29/19 1600 06/30/19 0417 06/30/19 1529 07/01/19 0419 07/01/19 1618 07/02/19 0400 07/02/19 1524 07/03/19 0441 07/03/19 1600 07/04/19 0500  AST 61*  --  46*  --  33  --  36  --  25  --   --   ALT 37  --  47*  --  37  --  42  --  34  --   --   ALKPHOS 123  --  138*  --  180*  --  235*  --  240*  --   --   BILITOT 1.0  --  0.8  --  0.6  --  0.7  --  0.7  --   --   PROT 5.2*  --  5.7*  --  5.6*  --  5.9*  --  6.1*  --   --   ALBUMIN 2.3*   < > 2.5*  2.5*   < > 2.4*  2.3*   < > 2.2*  2.2* 2.2* 2.4*  2.3* 2.3* 2.0*   < > = values in this interval not displayed.   Recent Labs  Lab 06/27/19 1904  LIPASE 21  AMYLASE 53   No results for input(s): AMMONIA in the last 168 hours.  ABG    Component Value Date/Time   PHART 7.361 06/29/2019 0530   PCO2ART 32.5 06/29/2019 0530   PO2ART 78.0 (L) 06/29/2019 0530   HCO3 18.0 (L) 06/29/2019 0530   ACIDBASEDEF 6.2 (H) 06/29/2019 0530   O2SAT 67.8 06/30/2019 0920     Coagulation Profile: Recent Labs  Lab 06/27/19 1349 06/27/19 1904 06/28/19 1314 06/30/19 0920  INR 1.7* 1.6* 1.6* 1.3*    Cardiac Enzymes: No results for input(s): CKTOTAL, CKMB, CKMBINDEX, TROPONINI in the last 168 hours.  HbA1C: No results found for: HGBA1C  CBG: Recent Labs  Lab 07/03/19 1613 07/03/19 1643 07/03/19 1944 07/03/19 2318 07/04/19 0300  GLUCAP 67* 149* 132* 142* 106*       Critical care time: 35 minutes       Roselie Awkward, MD [place] Pager: [phone] Cell: [phone] If no response, call [phone]

## 2019-07-04 NOTE — Progress Notes (Signed)
[place] for Infectious Disease    Date of Admission:  06/27/2019   Total days of antibiotics 8          ID: Amber Bowman is a 64 y.o. female with ecoli bacteremia, sepsis due to obstructive uropathy/left kidney hydronephrosis s/p perc neph Active Problems:   Acute renal failure (ARF) (HCC)    Subjective: Afebrile, starting to produce some urine.  Labs - leukocytosis slightly less than yesterday, has since stopped using dexamethasone for covid-19 management  Medications:  . Chlorhexidine Gluconate Cloth  6 each Topical Daily  . feeding supplement (PRO-STAT SUGAR FREE 64)  30 mL Per Tube BID  . felbamate  300 mg Per Tube Q12H  . heparin injection (subcutaneous)  5,000 Units Subcutaneous Q8H  . insulin aspart  0-9 Units Subcutaneous Q4H  . levETIRAcetam  500 mg Per Tube BID  . mouth rinse  15 mL Mouth Rinse BID  . midodrine  10 mg Oral TID WC  . multivitamin  15 mL Per Tube Daily  . pantoprazole sodium  40 mg Per Tube QHS  . sodium chloride flush  10-40 mL Intracatheter Q12H  . topiramate  200 mg Per Tube BID    Objective: Vital signs in last 24 hours: Temp:  [98.3 F (36.8 C)-98.9 F (37.2 C)] 98.8 F (37.1 C) (04/27 1149) Pulse Rate:  [79-108] 84 (04/27 1149) Resp:  [12-34] 14 (04/27 1149) BP: (81-136)/(29-95) 101/44 (04/27 1149) SpO2:  [86 %-100 %] 98 % (04/27 1149) Arterial Line BP: (66-95)/(47-83) 90/83 (04/27 0000) Weight:  [73.9 kg] 73.9 kg (04/27 0500)    Lab Results Recent Labs    07/03/19 0441 07/03/19 0441 07/03/19 1600 07/04/19 0500  WBC 102.0*  --   --  92.6*  HGB 11.9*  --   --  9.8*  HCT 34.8*  --   --  29.8*  NA 134*   < > 138 137  K 4.2   < > 4.3 4.5  CL 101   < > 104 104  CO2 26   < > 27 25  BUN 32*   < > 35* 33*  CREATININE 1.37*   < > 1.45* 1.32*   < > = values in this interval not displayed.   Liver Panel Recent Labs    07/02/19 0400 07/02/19 1524 07/03/19 0441 07/03/19 0441 07/03/19 1600 07/04/19 0500  PROT 5.9*   --  6.1*  --   --   --   ALBUMIN 2.2*  2.2*   < > 2.4*  2.3*   < > 2.3* 2.0*  AST 36  --  25  --   --   --   ALT 42  --  34  --   --   --   ALKPHOS 235*  --  240*  --   --   --   BILITOT 0.7  --  0.7  --   --   --   BILIDIR 0.1  --  0.1  --   --   --   IBILI 0.6  --  0.6  --   --   --    < > = values in this interval not displayed.    Microbiology: reviewed Studies/Results: DG Abd 1 View  Result Date: 07/02/2019 CLINICAL DATA:  Evaluate NG tube EXAM: ABDOMEN - 1 VIEW COMPARISON:  None. FINDINGS: The distal tip of the NG tube is likely near the junction of the pylorus and proximal duodenum. A left  nephrostomy tube is identified. Bilateral renal stones are noted. IMPRESSION: The NG tube is stable. I suspect the distal tip is within the duodenal bulb near the junction of the pylorus and duodenum. Electronically Signed   By: Dorise Bullion III M.D   On: 07/02/2019 15:31     Assessment/Plan: E.coli bacteremia 2/2 obstructive uropathy/pyelo/hydronephrosis = continue with ampicillin  leukocytsois = suspect this will improve since off of dexamethasone and trend down over the week  covid-19 disease= treated with remdesivir, and short course of dexamethasone, to continue to monitor,   Aki, oliguric =defer to pccm and renal for further management. Ampicillin is renally dosed  [facility] Cell: [phone] Pager: [phone]  07/04/2019, 12:06 PM

## 2019-07-04 NOTE — Progress Notes (Signed)
[place] Progress Note Patient Name: Amber Bowman DOB: 1955-12-25 MRN: [id number]   Date of Service  07/04/2019  HPI/Events of Note  Pt with hypotension on CRRT  eICU Interventions  Phenylephrine infusion ordered        Frederik Pear 07/04/2019, 5:03 AM

## 2019-07-05 DIAGNOSIS — N179 Acute kidney failure, unspecified: Secondary | ICD-10-CM | POA: Diagnosis not present

## 2019-07-05 LAB — COMPREHENSIVE METABOLIC PANEL
ALT: 27 U/L (ref 0–44)
AST: 21 U/L (ref 15–41)
Albumin: 2.2 g/dL — ABNORMAL LOW (ref 3.5–5.0)
Alkaline Phosphatase: 219 U/L — ABNORMAL HIGH (ref 38–126)
Anion gap: 8 (ref 5–15)
BUN: 27 mg/dL — ABNORMAL HIGH (ref 8–23)
CO2: 27 mmol/L (ref 22–32)
Calcium: 7.9 mg/dL — ABNORMAL LOW (ref 8.9–10.3)
Chloride: 103 mmol/L (ref 98–111)
Creatinine, Ser: 1.28 mg/dL — ABNORMAL HIGH (ref 0.44–1.00)
GFR calc Af Amer: 51 mL/min — ABNORMAL LOW (ref >60)
GFR calc non Af Amer: 44 mL/min — ABNORMAL LOW (ref >60)
Glucose, Bld: 138 mg/dL — ABNORMAL HIGH (ref 70–99)
Potassium: 4.5 mmol/L (ref 3.5–5.1)
Sodium: 138 mmol/L (ref 135–145)
Total Bilirubin: 0.6 mg/dL (ref 0.3–1.2)
Total Protein: 5.9 g/dL — ABNORMAL LOW (ref 6.5–8.1)

## 2019-07-05 LAB — RENAL FUNCTION PANEL
Albumin: 2.3 g/dL — ABNORMAL LOW (ref 3.5–5.0)
Anion gap: 10 (ref 5–15)
BUN: 30 mg/dL — ABNORMAL HIGH (ref 8–23)
CO2: 24 mmol/L (ref 22–32)
Calcium: 7.8 mg/dL — ABNORMAL LOW (ref 8.9–10.3)
Chloride: 103 mmol/L (ref 98–111)
Creatinine, Ser: 1.45 mg/dL — ABNORMAL HIGH (ref 0.44–1.00)
GFR calc Af Amer: 44 mL/min — ABNORMAL LOW (ref >60)
GFR calc non Af Amer: 38 mL/min — ABNORMAL LOW (ref >60)
Glucose, Bld: 147 mg/dL — ABNORMAL HIGH (ref 70–99)
Phosphorus: 3.1 mg/dL (ref 2.5–4.6)
Potassium: 4.4 mmol/L (ref 3.5–5.1)
Sodium: 137 mmol/L (ref 135–145)

## 2019-07-05 LAB — CBC WITH DIFFERENTIAL/PLATELET
Abs Immature Granulocytes: 8.2 10*3/uL — ABNORMAL HIGH (ref 0.00–0.07)
Band Neutrophils: 7 %
Basophils Absolute: 0 10*3/uL (ref 0.0–0.1)
Basophils Relative: 0 %
Eosinophils Absolute: 0.7 10*3/uL — ABNORMAL HIGH (ref 0.0–0.5)
Eosinophils Relative: 1 %
HCT: 28.7 % — ABNORMAL LOW (ref 36.0–46.0)
Hemoglobin: 9.4 g/dL — ABNORMAL LOW (ref 12.0–15.0)
Lymphocytes Relative: 9 %
Lymphs Abs: 6.7 10*3/uL — ABNORMAL HIGH (ref 0.7–4.0)
MCH: 31.1 pg (ref 26.0–34.0)
MCHC: 32.8 g/dL (ref 30.0–36.0)
MCV: 95 fL (ref 80.0–100.0)
Metamyelocytes Relative: 3 %
Monocytes Absolute: 3.7 10*3/uL — ABNORMAL HIGH (ref 0.1–1.0)
Monocytes Relative: 5 %
Myelocytes: 8 %
Neutro Abs: 54.8 10*3/uL — ABNORMAL HIGH (ref 1.7–7.7)
Neutrophils Relative %: 67 %
Platelets: 116 10*3/uL — ABNORMAL LOW (ref 150–400)
RBC: 3.02 MIL/uL — ABNORMAL LOW (ref 3.87–5.11)
RDW: 15.2 % (ref 11.5–15.5)
WBC: 74.1 10*3/uL (ref 4.0–10.5)
nRBC: 0.5 % — ABNORMAL HIGH (ref 0.0–0.2)

## 2019-07-05 LAB — APTT
aPTT: 111 seconds — ABNORMAL HIGH (ref 24–36)
aPTT: 124 seconds — ABNORMAL HIGH (ref 24–36)

## 2019-07-05 LAB — GLUCOSE, CAPILLARY
Glucose-Capillary: 117 mg/dL — ABNORMAL HIGH (ref 70–99)
Glucose-Capillary: 93 mg/dL (ref 70–99)
Glucose-Capillary: 138 mg/dL — ABNORMAL HIGH (ref 70–99)
Glucose-Capillary: 127 mg/dL — ABNORMAL HIGH (ref 70–99)
Glucose-Capillary: 113 mg/dL — ABNORMAL HIGH (ref 70–99)

## 2019-07-05 LAB — PHOSPHORUS: Phosphorus: 2.3 mg/dL — ABNORMAL LOW (ref 2.5–4.6)

## 2019-07-05 MED ORDER — SODIUM CHLORIDE 0.9 % IV SOLN
2.0000 g | Freq: Four times a day (QID) | INTRAVENOUS | Status: DC
  Administered 2019-07-05 – 2019-07-06 (×3): 2 g via INTRAVENOUS
  Filled 2019-07-05: qty 2000
  Filled 2019-07-05: qty 2
  Filled 2019-07-05: qty 2000
  Filled 2019-07-05: qty 2

## 2019-07-05 MED ORDER — MELATONIN 3 MG PO TABS
3.0000 mg | ORAL_TABLET | Freq: Every day | ORAL | Status: DC
  Administered 2019-07-05 – 2019-07-08 (×5): 3 mg via ORAL
  Filled 2019-07-05 (×5): qty 1

## 2019-07-05 NOTE — Progress Notes (Signed)
As expected, Ms. Mathurin labs continue to improve.  Her white cell count is down to 74,100.  Her platelet count is now 216,000.  Her hemoglobin is stable at 9.4.  I suspect her hemoglobin probably will not improve much until her renal function improves.  She is being treated for the E. coli bacteremia.  I think as treatment continues and E. coli bacteremia regresses, her blood counts will normalize.  For right now, we will just sign off from the case.  I must say this was quite interesting as we rarely see this type of leukemoid reaction from patient, particularly one who was 64 years old.  Lattie Haw, MD  1 [address]ians 5:18

## 2019-07-05 NOTE — Progress Notes (Signed)
eLink Physician-Brief Progress Note Patient Name: Amber Bowman DOB: [birth year] MRN: [id number]   Date of Service  07/05/2019  HPI/Events of Note  Insomnia  eICU Interventions  Melatonin 3 mg po nightly PRN insomnia ordered.        [name] Jian Hodgman 07/05/2019, 1:53 AM

## 2019-07-05 NOTE — Progress Notes (Signed)
[place] Progress Note Patient Name: Amber Bowman DOB: 11-26-1955 MRN: [id number]   Date of Service  07/05/2019  HPI/Events of Note  APTT 111  eICU Interventions  Repeat APTT to verify result        Frederik Pear 07/05/2019, 6:43 AM

## 2019-07-05 NOTE — Progress Notes (Signed)
CRRT machine turned off per MD order. Blood returned. No complications noted. Will continue to monitor.

## 2019-07-05 NOTE — Evaluation (Signed)
SLP Cancellation Note  Patient Details Name: Amber Bowman MRN: [id number] DOB: 1955/09/29   Cancelled treatment:       Reason Eval/Treat Not Completed: Medical issues which prohibited therapy;Fatigue/lethargy limiting ability to participate(RN reports concern that pt aspirated last night and states now she has gurgly breath sound and can not clear, pt not asking for po intake today per RN, will defer SLP session and reattempt tomorrow am)   [name], MS [place] Office [phone]  [name] 07/05/2019, 3:56 PM

## 2019-07-05 NOTE — Progress Notes (Signed)
Nutrition Follow-up  DOCUMENTATION CODES:   Not applicable  INTERVENTION:  - continue Vital 1.5 @ 50 ml/hr with 30 ml prostat BID. - will monitor if CRRT is re-started and need to re-adjust needs based on re-start.  - free water flush, if desired, to be per MD/NP.    NUTRITION DIAGNOSIS:   Increased nutrient needs related to acute illness, catabolic CLEXNTZ(GYFVC-94 infection; CRRT) as evidenced by estimated needs. -ongoing  GOAL:   Patient will meet greater than or equal to 90% of their needs -to be met with TF regimen  MONITOR:   TF tolerance, PO intake, Diet advancement, Labs, Weight trends  ASSESSMENT:   64 year old female with past medical history of colon polyps, condyloma, depression, epilepsy, osteopenia presented on 06/26/19 with fever, left-sided flank pain, and dyspnea. Patient waited in ED for 4 hours and then left. She returned on 06/27/19 and found to by hypotensive and reported significant amount of nausea with vomiting and poor po intake. Bedside US showed evidence of gallstones and mild biliary duct dilation, and COVID-19 positive  Significant Events: 4/20- admission 4/21- perc nephrostomy tube by IR; diet advanced from NPO to Regular at 1620 4/22- 47F NGT placed in R nare; CRRT initiation 4/23- initial RD assessment; TF initiation  4/26- diet changed from Regular to NPO at [place] 4/27- diet advanced from NPO to CLD at 1002   No intakes documented since admission. Able to talk with RN who confirms patient has several painful oral blisters. She states that there is concern for aspiration and SLP to evaluate patient and that she Investment banker, corporate) has not given patient PO items today. RN denies any issues related to TF at this time.   NGT remains in R nare and she is currently receiving Vital 1.5 @ 50 ml/hr with 30 ml prostat BID. This regimen is providing 2000 kcal (97% re-estimated kcal need), 111 grams protein, and 917 ml free water. Weight has been fluctuating throughout  admission. Weight today (73.8 kg) used to re-estimate needs as this is the lowest weight this admission and increased needs d/t COVID and CRRT (4/22-4/28).  Per notes: - dx of septic shock d/t E coli bacteremia this admission - nephrolithiasis and L hydronephrosis--s/p nephrostomy - AKI from ATN--CRRT being held today as patient now making urine - cholelithiasis--plan for outpatient Surgery follow-up - mouth sores/blisters--magic mouthwash - hx of epilepsy   Labs reviewed; CBGs: 117 and 93 mg/dl, BUN: 27 mg/dl, creatinine: 1.28 mg/dl, Ca: 7.9 mg/dl, Phos: 2.3 mg/dl, Alk Phos elevated, GFR: 44 ml/min. Medications reviewed; sliding scale novolog, 40 mg protonix per NGT/day. IVF; D10 @ 20 ml/hr (163 kcal).      NUTRITION - FOCUSED PHYSICAL EXAM:  unable to complete at this time.   Diet Order:   Diet Order            Diet clear liquid Room service appropriate? Yes; Fluid consistency: Thin  Diet effective now              EDUCATION NEEDS:   Not appropriate for education at this time  Skin:  Skin Assessment: Skin Integrity Issues: Skin Integrity Issues:: Other (Comment) Other: MASD; buttocks  Last BM:  unknown  Height:   Ht Readings from Last 1 Encounters:  06/27/19 '5\' 4"'$  (1.626 m)    Weight:   Wt Readings from Last 1 Encounters:  07/05/19 73.8 kg     Estimated Nutritional Needs:  Kcal:  4967-5916 kcal (30-33 kcal/kg) Protein:  145-162 grams (2-2.2 grams/kg) Fluid:  >/=  2 L/day     Amber Matin, MS, RD, LDN, CNSC Inpatient Clinical Dietitian RD pager # available in [facility]  After hours/weekend pager # available in [facility]

## 2019-07-05 NOTE — Progress Notes (Signed)
NAME:  Amber Bowman, MRN:  [id number], DOB:  [birth year], LOS: 8 ADMISSION DATE:  06/27/2019, CONSULTATION DATE:  4/20 REFERRING MD:  Kathrynn Humble, CHIEF COMPLAINT:  Nausea/vomiting, pain   Brief History   64 y/o female admitted with septic shock due to obstructive uropathy/pyelonephritis, also found to have COVID 19.  Developed AKI leading to CRRT.  Past Medical History  Colon polyps, Depression, Epilepsy, Osteopenia  Significant Hospital Events   4/20 Admit 4/21 percutaneous placement of nephrostomy tube by IR 4/22 start CRRT 4/23 off pressors 4/26>4/28 making more urine, blisters in mouth, off pressors by 4/28 AM  Consults:   Surgery gallstones 4/20  Nephrology AKI 4/21  Urology obstructive uropathy 4/21  ID diarrhea/leukocytosis 4/25   Hematology leukocytosis 4/26  Procedures:  R IJ HD cath 4/21 >   Significant Diagnostic Tests:   RUQ abd u/s 4/20 >> cholelithiasis with mild CBD distention, negative Murphy sign  CT chest 4/20 >> atherosclerosis, small hiatal hernia, small effusions  CT abd/pelvis 4/20 >> cholelithiasis, 10 mm stone Lt proximal ureter with mild hydronephrosis  Renal u/s 4/23 >> b/l renal calculi  Echo 4/23 >> EF 40 to 45%, can r/o PFO, mod TR  Doppler legs b/l 4/23 >> no DVT  CT abd/pelvis 4/25 >> b/l effusions, patchy ASD, large gallstone at GB neck, b/l renal stones  Micro Data:  SARS CoV2 Ag 4/20 >> positive Blood 4/20 >> E coli Urine 4/21 >> negative Legionella Ab 4/21 >> negative Pneumococcal Ag 4/21 >> negative C diff 4/25 >> negative Blood 4/25 >>   Antimicrobials:  Rocephin 4/20 >> 4/26 Ampicillin 4/26 >>  Remdesivir 4/20 >> 4/24 Dexamethasone 4/20 >> 4/27   Interim history/subjective:    making more urine, blisters in mouth, off pressors by 4/28 AM, ? Aspiration with juice overnight, SLP evaluation ordered, complains of back pain, some issues with access pressures on HD cath  Objective   Blood pressure (!) 114/44, pulse  90, temperature 97.6 F (36.4 C), temperature source Axillary, resp. rate 20, height 5\' 4"  (1.626 m), weight 73.8 kg, last menstrual period 02/11/2008, SpO2 97 %.        Intake/Output Summary (Last 24 hours) at 07/05/2019 0704 Last data filed at 07/05/2019 0600 Gross per 24 hour  Intake 2818.76 ml  Output 3732 ml  Net -913.24 ml   Filed Weights   07/02/19 0431 07/04/19 0500 07/05/19 0416  Weight: 82 kg 73.9 kg 73.8 kg    Examination:  General:  Chronically ill appearing, resting comfortably in bed HENT: NCAT blisters on lips, NG in place, some blistering on soft palate, but tongue spared PULM: CTA B, normal effort CV: RRR, no mgr GI: BS+, soft, nontender MSK: normal bulk and tone Neuro: drowsy but arouses easily, moves all four extremities well   Resolved Hospital Problem list     Assessment & Plan:  Septic shock from E coli bacteremia, nephrolithiasis, and left hydronephrosis, s/p percutaneous nephrostomy > monitor hemodynamics > continue midodrine  AKI from ATN : now making urine, hopeful for recovery of renal function > hold CRRT today > Monitor BMET and UOP > Replace electrolytes as needed  Leukocytosis> improving, leukemoid reaction Thrombocytopenia > improving > monitor for bleeding > sub q heparin for DVT prophylaxis  COVID 19 without pneumonia, s/p remdesivir Monitor for dyspnea/hypoxemia  Cholelithiasis > surgery monitoring, no gallbladder swelling, no wall thickening, no sign of acute cholecystitis > will need outpatient general surgery follow up  Epilepsy > continue keppra and topamax  Mouth  sores/blisters: mild, no sign of systemic reaction > magic mouthwash > serial exam   Best practice:  Diet: tube feeding> advance to clears today Pain/Anxiety/Delirium protocol (if indicated): n/a VAP protocol (if indicated): n/a DVT prophylaxis: add back sub q heparin GI prophylaxis: PPI Glucose control: SSI Mobility: out of bed Code Status:  full Family Communication: Billy updated 4/28 Disposition: remain in ICU  Labs   CBC: Recent Labs  Lab 07/01/19 0419 07/02/19 0400 07/03/19 0441 07/04/19 0500 07/05/19 0425  WBC 63.6* 90.8* 102.0* 92.6* 74.1*  NEUTROABS 45.8* 74.5* 67.3* 63.1* 54.8*  HGB 10.0* 11.1* 11.9* 9.8* 9.4*  HCT 29.4* 33.8* 34.8* 29.8* 28.7*  MCV 93.0 92.1 91.3 94.0 95.0  PLT 39* 58* 72* 83* 116*    Basic Metabolic Panel: Recent Labs  Lab 06/29/19 0353 06/29/19 0353 06/29/19 1600 06/29/19 1600 06/30/19 0417 06/30/19 1529 07/01/19 0419 07/01/19 1618 07/02/19 0400 07/02/19 1524 07/03/19 0441 07/03/19 1600 07/04/19 0500 07/04/19 1600 07/05/19 0425  NA 138   < > 136   < > 137   < > 135   < > 135   < > 134* 138 137 134* 138  K 3.9   < > 3.8  3.8   < > 4.1   < > 4.2   < > 4.2   < > 4.2 4.3 4.5 4.2 4.5  CL 102   < > 104   < > 102   < > 100   < > 102   < > 101 104 104 101 103  CO2 17*   < > 20*   < > 22   < > 26   < > 26   < > 26 27 25 25 27   GLUCOSE 131*   < > 139*   < > 150*   < > 120*   < > 132*   < > 141* 136* 146* 128* 138*  BUN 58*   < > 46*   < > 40*   < > 31*   < > 30*   < > 32* 35* 33* 30* 27*  CREATININE 4.69*   < > 3.38*   < > 2.34*   < > 1.51*   < > 1.27*   < > 1.37* 1.45* 1.32* 1.21* 1.28*  CALCIUM 6.7*   < > 6.9*   < > 7.6*   < > 7.6*   < > 7.9*   < > 8.1* 7.8* 7.5* 7.6* 7.9*  MG 1.3*  --  2.7*  --  2.5*  --  2.5*  --  2.5*  --   --   --   --   --   --   PHOS 4.6   < > 3.4  3.4   < > 2.2*   < > 2.1*   < > 1.3*   < > 1.5* 1.9* 2.1* 2.5 2.3*   < > = values in this interval not displayed.   GFR: Estimated Creatinine Clearance: 43.7 mL/min (A) (by C-G formula based on SCr of 1.28 mg/dL (H)). Recent Labs  Lab 06/29/19 0353 06/30/19 0417 07/02/19 0400 07/03/19 0441 07/04/19 0500 07/05/19 0425  PROCALCITON <0.10  --   --   --   --   --   WBC 33.5*   < > 90.8* 102.0* 92.6* 74.1*   < > = values in this interval not displayed.    Liver Function Tests: Recent Labs  Lab  06/30/19 0417 06/30/19 1529 07/01/19 0419 07/01/19 1618  07/02/19 0400 07/02/19 1524 07/03/19 0441 07/03/19 1600 07/04/19 0500 07/04/19 1600 07/05/19 0425  AST 46*  --  33  --  36  --  25  --   --   --  21  ALT 47*  --  37  --  42  --  34  --   --   --  27  ALKPHOS 138*  --  180*  --  235*  --  240*  --   --   --  219*  BILITOT 0.8  --  0.6  --  0.7  --  0.7  --   --   --  0.6  PROT 5.7*  --  5.6*  --  5.9*  --  6.1*  --   --   --  5.9*  ALBUMIN 2.5*  2.5*   < > 2.4*  2.3*   < > 2.2*  2.2*   < > 2.4*  2.3* 2.3* 2.0* 2.1* 2.2*   < > = values in this interval not displayed.   No results for input(s): LIPASE, AMYLASE in the last 168 hours. No results for input(s): AMMONIA in the last 168 hours.  ABG    Component Value Date/Time   PHART 7.361 06/29/2019 0530   PCO2ART 32.5 06/29/2019 0530   PO2ART 78.0 (L) 06/29/2019 0530   HCO3 18.0 (L) 06/29/2019 0530   ACIDBASEDEF 6.2 (H) 06/29/2019 0530   O2SAT 67.8 06/30/2019 0920     Coagulation Profile: Recent Labs  Lab 06/28/19 1314 06/30/19 0920  INR 1.6* 1.3*    Cardiac Enzymes: No results for input(s): CKTOTAL, CKMB, CKMBINDEX, TROPONINI in the last 168 hours.  HbA1C: No results found for: HGBA1C  CBG: Recent Labs  Lab 07/04/19 1203 07/04/19 1600 07/04/19 1941 07/04/19 2319 07/05/19 0309  GLUCAP 128* 124* 85 107* 117*       Critical care time: 35 minutes       Roselie Awkward, MD [place] Pager: [phone] Cell: [phone] If no response, call [phone]

## 2019-07-05 NOTE — Progress Notes (Signed)
Patient ID: Amber Bowman, female   DOB: 1955/06/14, 64 y.o.   MRN: [id number] [facility] Progress Note   Assessment/ Plan:   1. Acute kidney Injury: likely ATN from sepsis/shock and a component of urinary obstruction- s/p left percutaneous nephrostomy tube placement on 4/22.  She is nonoliguric with increasing urine output from both her Foley catheter and nephrostomy tube; she is now off of pressors and the plan is to discontinue her CRRT at some point this afternoon and monitor renal recovery.  If she needs additional renal replacement therapy, she may need to be transferred to [facility] for intermittent hemodialysis.  2. Volume overload: secondary to AKI/fluid accumulation; allow auto diuresis with limited ultrafiltration with CRRT at this time. 3. E. Coli bacteremia and sepsis- likely of urinary source and on ampicillin for focused coverage--seen by hematology as well who feel that this is likely a leukemoid reaction from ongoing corticosteroids (additional work-up initiated to evaluate for CML) 4. Left ureteral calculus with hydronephrosis: s/p PCN with decent drainage. 5. Seizure disorder: Remains seizure-free and will need to revisit continuing Topamax.  Subjective:   Without acute events overnight, improving urine output noted from both Foley/nephrostomy.  Objective:   BP (!) 119/49   Pulse 81   Temp 98.4 F (36.9 C) (Axillary)   Resp (!) 21   Ht 5\' 4"  (1.626 m)   Wt 73.8 kg   LMP 02/11/2008   SpO2 100%   BMI 27.93 kg/m   Intake/Output Summary (Last 24 hours) at 07/05/2019 1309 Last data filed at 07/05/2019 1300 Gross per 24 hour  Intake 2730.26 ml  Output 3463 ml  Net -732.74 ml   Weight change: -0.1 kg  Physical Exam: Gen: Appears to be comfortable resting in bed [id number] regular rhythm, normal rate, S1 and S2 normal  Resp:Clear to auscultation, no rales/rhonchi [id number], obese, with left sided nephrostomy tube in place Ext: Trace right lower extremity  edema, trace left lower extremity edema  Imaging: No results found.  Labs: BMET Recent Labs  Lab 07/02/19 0400 07/02/19 1524 07/03/19 0441 07/03/19 1600 07/04/19 0500 07/04/19 1600 07/05/19 0425  NA 135 132* 134* 138 137 134* 138  K 4.2 3.6 4.2 4.3 4.5 4.2 4.5  CL 102 100 101 104 104 101 103  CO2 26 25 26 27 25 25 27   GLUCOSE 132* 135* 141* 136* 146* 128* 138*  BUN 30* 31* 32* 35* 33* 30* 27*  CREATININE 1.27* 1.37* 1.37* 1.45* 1.32* 1.21* 1.28*  CALCIUM 7.9* 7.8* 8.1* 7.8* 7.5* 7.6* 7.9*  PHOS 1.3* 1.5* 1.5* 1.9* 2.1* 2.5 2.3*   CBC Recent Labs  Lab 07/02/19 0400 07/03/19 0441 07/04/19 0500 07/05/19 0425  WBC 90.8* 102.0* 92.6* 74.1*  NEUTROABS 74.5* 67.3* 63.1* 54.8*  HGB 11.1* 11.9* 9.8* 9.4*  HCT 33.8* 34.8* 29.8* 28.7*  MCV 92.1 91.3 94.0 95.0  PLT 58* 72* 83* 116*    Medications:    . Chlorhexidine Gluconate Cloth  6 each Topical Daily  . feeding supplement (PRO-STAT SUGAR FREE 64)  30 mL Per Tube BID  . felbamate  300 mg Per Tube Q12H  . heparin injection (subcutaneous)  5,000 Units Subcutaneous Q8H  . insulin aspart  0-9 Units Subcutaneous Q4H  . levETIRAcetam  500 mg Per Tube BID  . mouth rinse  15 mL Mouth Rinse BID  . melatonin  3 mg Oral QHS  . midodrine  10 mg Oral TID WC  . multivitamin  15 mL Per Tube Daily  .  pantoprazole sodium  40 mg Per Tube QHS  . sodium chloride flush  10-40 mL Intracatheter Q12H  . topiramate  200 mg Per Tube BID   Elmarie Shiley, MD 07/05/2019, 1:09 PM

## 2019-07-05 NOTE — Progress Notes (Signed)
PHARMACY NOTE:  ANTIMICROBIAL RENAL DOSAGE ADJUSTMENT  Current antimicrobial regimen includes a mismatch between antimicrobial dosage and estimated renal function.  As per policy approved by the Pharmacy & Therapeutics and Medical Executive Committees, the antimicrobial dosage will be adjusted accordingly.  Current antimicrobial dosage:  Ampicillin 2g IV q8h  Indication: E.coli bacteremia  Renal Function: AKI from ATN in the setting of sepsis.  She has been on CRRT 4/22-4/28.  CRRT scheduled to stop on 4/28 at 15:00.  Estimated Creatinine Clearance: 43.7 mL/min (A) (by C-G formula based on SCr of 1.28 mg/dL (H)). []      On intermittent HD, scheduled: []      On CRRT    Antimicrobial dosage has been changed to:  Ampicillin 2g IV q6h   Additional comments:   Thank you for allowing pharmacy to be a part of this patient's care.  Gretta Arab PharmD, BCPS Clinical Pharmacist [facility] main pharmacy [phone] 07/05/2019 3:04 PM

## 2019-07-06 DIAGNOSIS — N179 Acute kidney failure, unspecified: Secondary | ICD-10-CM | POA: Diagnosis not present

## 2019-07-06 LAB — RENAL FUNCTION PANEL
Albumin: 2 g/dL — ABNORMAL LOW (ref 3.5–5.0)
Albumin: 2 g/dL — ABNORMAL LOW (ref 3.5–5.0)
Anion gap: 9 (ref 5–15)
Anion gap: 8 (ref 5–15)
BUN: 42 mg/dL — ABNORMAL HIGH (ref 8–23)
BUN: 52 mg/dL — ABNORMAL HIGH (ref 8–23)
CO2: 25 mmol/L (ref 22–32)
CO2: 24 mmol/L (ref 22–32)
Calcium: 7.5 mg/dL — ABNORMAL LOW (ref 8.9–10.3)
Calcium: 7.5 mg/dL — ABNORMAL LOW (ref 8.9–10.3)
Chloride: 102 mmol/L (ref 98–111)
Chloride: 103 mmol/L (ref 98–111)
Creatinine, Ser: 2.18 mg/dL — ABNORMAL HIGH (ref 0.44–1.00)
Creatinine, Ser: 2.49 mg/dL — ABNORMAL HIGH (ref 0.44–1.00)
GFR calc Af Amer: 27 mL/min — ABNORMAL LOW (ref >60)
GFR calc Af Amer: 23 mL/min — ABNORMAL LOW (ref >60)
GFR calc non Af Amer: 23 mL/min — ABNORMAL LOW (ref >60)
GFR calc non Af Amer: 20 mL/min — ABNORMAL LOW (ref >60)
Glucose, Bld: 125 mg/dL — ABNORMAL HIGH (ref 70–99)
Glucose, Bld: 129 mg/dL — ABNORMAL HIGH (ref 70–99)
Phosphorus: 4.4 mg/dL (ref 2.5–4.6)
Phosphorus: 6.1 mg/dL — ABNORMAL HIGH (ref 2.5–4.6)
Potassium: 4.3 mmol/L (ref 3.5–5.1)
Potassium: 4.1 mmol/L (ref 3.5–5.1)
Sodium: 136 mmol/L (ref 135–145)
Sodium: 135 mmol/L (ref 135–145)

## 2019-07-06 LAB — CBC WITH DIFFERENTIAL/PLATELET
Abs Immature Granulocytes: 7.71 10*3/uL — ABNORMAL HIGH (ref 0.00–0.07)
Basophils Absolute: 0.1 10*3/uL (ref 0.0–0.1)
Basophils Relative: 0 %
Eosinophils Absolute: 0.6 10*3/uL — ABNORMAL HIGH (ref 0.0–0.5)
Eosinophils Relative: 1 %
HCT: 26.6 % — ABNORMAL LOW (ref 36.0–46.0)
Hemoglobin: 8.6 g/dL — ABNORMAL LOW (ref 12.0–15.0)
Immature Granulocytes: 15 %
Lymphocytes Relative: 9 %
Lymphs Abs: 4.7 10*3/uL — ABNORMAL HIGH (ref 0.7–4.0)
MCH: 31.3 pg (ref 26.0–34.0)
MCHC: 32.3 g/dL (ref 30.0–36.0)
MCV: 96.7 fL (ref 80.0–100.0)
Monocytes Absolute: 3.5 10*3/uL — ABNORMAL HIGH (ref 0.1–1.0)
Monocytes Relative: 7 %
Neutro Abs: 33.8 10*3/uL — ABNORMAL HIGH (ref 1.7–7.7)
Neutrophils Relative %: 68 %
Platelets: 169 10*3/uL (ref 150–400)
RBC: 2.75 MIL/uL — ABNORMAL LOW (ref 3.87–5.11)
RDW: 15.6 % — ABNORMAL HIGH (ref 11.5–15.5)
WBC: 50.3 10*3/uL (ref 4.0–10.5)
nRBC: 0.5 % — ABNORMAL HIGH (ref 0.0–0.2)

## 2019-07-06 LAB — COMPREHENSIVE METABOLIC PANEL
ALT: 23 U/L (ref 0–44)
AST: 19 U/L (ref 15–41)
Albumin: 2 g/dL — ABNORMAL LOW (ref 3.5–5.0)
Alkaline Phosphatase: 189 U/L — ABNORMAL HIGH (ref 38–126)
Anion gap: 9 (ref 5–15)
BUN: 42 mg/dL — ABNORMAL HIGH (ref 8–23)
CO2: 24 mmol/L (ref 22–32)
Calcium: 7.6 mg/dL — ABNORMAL LOW (ref 8.9–10.3)
Chloride: 102 mmol/L (ref 98–111)
Creatinine, Ser: 2.17 mg/dL — ABNORMAL HIGH (ref 0.44–1.00)
GFR calc Af Amer: 27 mL/min — ABNORMAL LOW (ref >60)
GFR calc non Af Amer: 23 mL/min — ABNORMAL LOW (ref >60)
Glucose, Bld: 131 mg/dL — ABNORMAL HIGH (ref 70–99)
Potassium: 4.3 mmol/L (ref 3.5–5.1)
Sodium: 135 mmol/L (ref 135–145)
Total Bilirubin: 0.7 mg/dL (ref 0.3–1.2)
Total Protein: 5.8 g/dL — ABNORMAL LOW (ref 6.5–8.1)

## 2019-07-06 LAB — GLUCOSE, CAPILLARY
Glucose-Capillary: 112 mg/dL — ABNORMAL HIGH (ref 70–99)
Glucose-Capillary: 114 mg/dL — ABNORMAL HIGH (ref 70–99)
Glucose-Capillary: 124 mg/dL — ABNORMAL HIGH (ref 70–99)
Glucose-Capillary: 129 mg/dL — ABNORMAL HIGH (ref 70–99)
Glucose-Capillary: 131 mg/dL — ABNORMAL HIGH (ref 70–99)
Glucose-Capillary: 148 mg/dL — ABNORMAL HIGH (ref 70–99)

## 2019-07-06 LAB — APTT: aPTT: 29 seconds (ref 24–36)

## 2019-07-06 MED ORDER — GUAIFENESIN-CODEINE 100-10 MG/5ML PO SOLN: 10.0000 mL | ORAL | Status: DC | PRN

## 2019-07-06 MED ORDER — SODIUM CHLORIDE 0.9 % IV SOLN
2.0000 g | Freq: Three times a day (TID) | INTRAVENOUS | Status: AC
  Administered 2019-07-06 – 2019-07-11 (×14): 2 g via INTRAVENOUS
  Filled 2019-07-06: qty 2
  Filled 2019-07-06: qty 2000
  Filled 2019-07-06: qty 2
  Filled 2019-07-06: qty 2000
  Filled 2019-07-06 (×3): qty 2
  Filled 2019-07-06 (×2): qty 2000
  Filled 2019-07-06: qty 2
  Filled 2019-07-06: qty 2000
  Filled 2019-07-06: qty 2
  Filled 2019-07-06: qty 2000
  Filled 2019-07-06 (×2): qty 2

## 2019-07-06 MED ORDER — HYDROCODONE-ACETAMINOPHEN 5-325 MG PO TABS
1.0000 | ORAL_TABLET | Freq: Four times a day (QID) | ORAL | Status: DC | PRN
  Administered 2019-07-06 – 2019-07-07 (×2): 1
  Filled 2019-07-06 (×2): qty 1

## 2019-07-06 NOTE — Progress Notes (Signed)
NAME:  Amber Bowman, MRN:  [id number], DOB:  Sep 14, 1955, LOS: 9 ADMISSION DATE:  06/27/2019, CONSULTATION DATE:  4/20 REFERRING MD:  Kathrynn Humble, CHIEF COMPLAINT:  Nausea/vomiting, pain   Brief History   64 y/o female admitted with septic shock due to obstructive uropathy/pyelonephritis, also found to have COVID 19.  Developed AKI leading to CRRT.  Past Medical History  Colon polyps, Depression, Epilepsy, Osteopenia  Significant Hospital Events   4/20 Admit 4/21 percutaneous placement of nephrostomy tube by IR 4/22 start CRRT 4/23 off pressors 4/26>4/28 making more urine, blisters in mouth, off pressors by 4/28 AM  Consults:   Surgery gallstones 4/20  Nephrology AKI 4/21  Urology obstructive uropathy 4/21  ID diarrhea/leukocytosis 4/25   Hematology leukocytosis 4/26  Procedures:  R IJ HD cath 4/21 >   Significant Diagnostic Tests:   RUQ abd u/s 4/20 >> cholelithiasis with mild CBD distention, negative Murphy sign  CT chest 4/20 >> atherosclerosis, small hiatal hernia, small effusions  CT abd/pelvis 4/20 >> cholelithiasis, 10 mm stone Lt proximal ureter with mild hydronephrosis  Renal u/s 4/23 >> b/l renal calculi  Echo 4/23 >> EF 40 to 45%, can r/o PFO, mod TR  Doppler legs b/l 4/23 >> no DVT  CT abd/pelvis 4/25 >> b/l effusions, patchy ASD, large gallstone at GB neck, b/l renal stones  Micro Data:  SARS CoV2 Ag 4/20 >> positive Blood 4/20 >> E coli Urine 4/21 >> negative Legionella Ab 4/21 >> negative Pneumococcal Ag 4/21 >> negative C diff 4/25 >> negative Blood 4/25 >>   Antimicrobials:  Rocephin 4/20 >> 4/26 Ampicillin 4/26 >>  Remdesivir 4/20 >> 4/24 Dexamethasone 4/20 >> 4/27   Interim history/subjective:   Up in chair Mouth hurts Back hurts   Objective   Blood pressure (!) 115/42, pulse 96, temperature 98.9 F (37.2 C), temperature source Axillary, resp. rate 18, height 5\' 4"  (1.626 m), weight 73.8 kg, last menstrual period 02/11/2008,  SpO2 100 %.        Intake/Output Summary (Last 24 hours) at 07/06/2019 1029 Last data filed at 07/06/2019 0900 Gross per 24 hour  Intake 3569.22 ml  Output 1593 ml  Net 1976.22 ml   Filed Weights   07/02/19 0431 07/04/19 0500 07/05/19 0416  Weight: 82 kg 73.9 kg 73.8 kg    Examination:  General:  Generally very weak, resting comfortably in bed HENT: NCAT mucus membranes with viral appearing ulcers on lips, minimal blistering in mouth PULM: few crackles bases B, normal effort CV: RRR, no mgr GI: BS+, soft, nontender MSK: normal bulk and tone Derm: macular/papular erythematous non-raised rash feet Neuro: awake, alert, no distress, MAEW   Resolved Hospital Problem list     Assessment & Plan:  Septic shock from E coli bacteremia, nephrolithiasis, and left hydronephrosis, s/p percutaneous nephrostomy> monitor > monitor hemodynamics off vasopressors > continue midodrine  AKI from ATN : now making urine, hopeful for recovery of renal function but labs a little worse today Left hydronephrosis, s/p PCN placement > monitor off CRRT, decision to restart per nephrology > Monitor BMET and UOP > Replace electrolytes as needed > drain management per urology/IR  Leukocytosis> improving, leukemoid reaction Thrombocytopenia > improving > monitor for bleeding > sub cutaneous heparin for DVT prophylaxis  COVID 19 without pneumonia, s/p remdesivir Monitor for dyspnea/hypoxemia  Cholelithiasis > surgery monitoring, no gallbladder swelling, no wall thickening, no sign of acute cholecystitis Will need outpatient general surgery follow up  Epilepsy > continue keppra and topamax  Mouth sores/blisters: suspect COVID, mild, no sign of systemic reaction > magic mouthwash > serial exam  Physical deconditioning > PT consult > out of bed  Transfer to [facility]  Best practice:  Diet: tube feeding> advance as tolerated, full liquid Pain/Anxiety/Delirium protocol (if indicated): n/a VAP  protocol (if indicated): n/a DVT prophylaxis: add back sub q heparin GI prophylaxis: PPI Glucose control: SSI Mobility: out of bed Code Status: full Family Communication: Will update Billy by phone Disposition: remain in ICU  Labs   CBC: Recent Labs  Lab 07/02/19 0400 07/03/19 0441 07/04/19 0500 07/05/19 0425 07/06/19 0314  WBC 90.8* 102.0* 92.6* 74.1* 50.3*  NEUTROABS 74.5* 67.3* 63.1* 54.8* 33.8*  HGB 11.1* 11.9* 9.8* 9.4* 8.6*  HCT 33.8* 34.8* 29.8* 28.7* 26.6*  MCV 92.1 91.3 94.0 95.0 96.7  PLT 58* 72* 83* 116* [id number]    Basic Metabolic Panel: Recent Labs  Lab 06/29/19 1600 06/29/19 1600 06/30/19 0417 06/30/19 1529 07/01/19 0419 07/01/19 1618 07/02/19 0400 07/02/19 1524 07/04/19 0500 07/04/19 1600 07/05/19 0425 07/05/19 1600 07/06/19 0314  NA 136   < > 137   < > 135   < > 135   < > 137 134* 138 137 135  136  K 3.8  3.8   < > 4.1   < > 4.2   < > 4.2   < > 4.5 4.2 4.5 4.4 4.3  4.3  CL 104   < > 102   < > 100   < > 102   < > 104 101 103 103 102  102  CO2 20*   < > 22   < > 26   < > 26   < > 25 25 27 24 24  25   GLUCOSE 139*   < > 150*   < > 120*   < > 132*   < > 146* 128* 138* 147* 131*  125*  BUN 46*   < > 40*   < > 31*   < > 30*   < > 33* 30* 27* 30* 42*  42*  CREATININE 3.38*   < > 2.34*   < > 1.51*   < > 1.27*   < > 1.32* 1.21* 1.28* 1.45* 2.17*  2.18*  CALCIUM 6.9*   < > 7.6*   < > 7.6*   < > 7.9*   < > 7.5* 7.6* 7.9* 7.8* 7.6*  7.5*  MG 2.7*  --  2.5*  --  2.5*  --  2.5*  --   --   --   --   --   --   PHOS 3.4  3.4   < > 2.2*   < > 2.1*   < > 1.3*   < > 2.1* 2.5 2.3* 3.1 4.4   < > = values in this interval not displayed.   GFR: Estimated Creatinine Clearance: 25.6 mL/min (A) (by C-G formula based on SCr of 2.18 mg/dL (H)). Recent Labs  Lab 07/03/19 0441 07/04/19 0500 07/05/19 0425 07/06/19 0314  WBC 102.0* 92.6* 74.1* 50.3*    Liver Function Tests: Recent Labs  Lab 07/01/19 0419 07/01/19 1618 07/02/19 0400 07/02/19 1524  07/03/19 0441 07/03/19 1600 07/04/19 0500 07/04/19 1600 07/05/19 0425 07/05/19 1600 07/06/19 0314  AST 33  --  36  --  25  --   --   --  21  --  19  ALT 37  --  42  --  34  --   --   --  27  --  23  ALKPHOS 180*  --  235*  --  240*  --   --   --  219*  --  189*  BILITOT 0.6  --  0.7  --  0.7  --   --   --  0.6  --  0.7  PROT 5.6*  --  5.9*  --  6.1*  --   --   --  5.9*  --  5.8*  ALBUMIN 2.4*  2.3*   < > 2.2*  2.2*   < > 2.4*  2.3*   < > 2.0* 2.1* 2.2* 2.3* 2.0*  2.0*   < > = values in this interval not displayed.   No results for input(s): LIPASE, AMYLASE in the last 168 hours. No results for input(s): AMMONIA in the last 168 hours.  ABG    Component Value Date/Time   PHART 7.361 06/29/2019 0530   PCO2ART 32.5 06/29/2019 0530   PO2ART 78.0 (L) 06/29/2019 0530   HCO3 18.0 (L) 06/29/2019 0530   ACIDBASEDEF 6.2 (H) 06/29/2019 0530   O2SAT 67.8 06/30/2019 0920     Coagulation Profile: Recent Labs  Lab 06/30/19 0920  INR 1.3*    Cardiac Enzymes: No results for input(s): CKTOTAL, CKMB, CKMBINDEX, TROPONINI in the last 168 hours.  HbA1C: No results found for: HGBA1C  CBG: Recent Labs  Lab 07/05/19 1623 07/05/19 2005 07/06/19 0000 07/06/19 0330 07/06/19 0759  GLUCAP 127* 113* 131* 112* 129*       Critical care time: n/a      Roselie Awkward, MD [place] [facility] Pager: [phone] Cell: [phone] If no response, call [phone]

## 2019-07-06 NOTE — Progress Notes (Signed)
PHARMACY NOTE:  ANTIMICROBIAL RENAL DOSAGE ADJUSTMENT  Current antimicrobial regimen includes a mismatch between antimicrobial dosage and estimated renal function.  As per policy approved by the Pharmacy & Therapeutics and Medical Executive Committees, the antimicrobial dosage will be adjusted accordingly.  Current antimicrobial dosage:  Ampicillin 2g IV q6h  Indication: E.coli bacteremia  Renal Function: AKI from ATN in the setting of sepsis.  She was on CRRT 4/22-4/28.  CRRT stopped on 4/28 at 15:00. SCr increased today to 2.18  Estimated Creatinine Clearance: 25 mL/min (A) (by C-G formula based on SCr of 2.18 mg/dL (H)). []      On intermittent HD, scheduled: []      On CRRT    Antimicrobial dosage has been changed to:  Ampicillin 2g IV q8h   Additional comments:  Thank you for allowing pharmacy to be a part of this patient's care.  Peggyann Juba, PharmD, BCPS [facility] main pharmacy [phone] 07/06/2019 11:29 AM

## 2019-07-06 NOTE — Evaluation (Signed)
Clinical/Bedside Swallow Evaluation Patient Details  Name: Amber Bowman MRN: [id number] Date of Birth: 1956/02/24  Today's Date: 07/06/2019 Time: SLP Start Time (ACUTE ONLY): 0935 SLP Stop Time (ACUTE ONLY): 1010 SLP Time Calculation (min) (ACUTE ONLY): 35 min  Past Medical History:  Past Medical History:  Diagnosis Date  . Colon polyps 2014  . Condyloma   . Depression   . Epilepsy ([place])   . Osteopenia 04/2018   T score -1.8 FRAX 9.5% / 1.1% overall stable from prior DEXA  . Seizures ([place])    Past Surgical History:  Past Surgical History:  Procedure Laterality Date  . CATARACT EXTRACTION  03/2015  . IR NEPHROSTOMY PLACEMENT LEFT  06/28/2019   HPI:  64yo female admitted 06/27/19 with hypotension, n/v, poor po intake. Pt found to have gallstones, septic shock due to obstructive uropathy/pyelonephritis and COVD19. Pt developed AKI, resulting in CRRT. PMH: HTN, DM, heart disease, seizures. CXR = patchy ill-defined areas of airspace consolidation and interstitial prominence scattered throughout the lungs bilaterally   Assessment / Plan / Recommendation Clinical Impression  Pt seen at bedside for clinical swallow evaluation. Pt reports natural dentition and dentures, but was unable to open mouth very wide due to significant irritation (blisters, sores, bleeding) on her upper and lower lips. Whiteish spots were noted on lingual and soft palatal surfaces. Given pt significant pain with the swallow, these sores are suspected to be present down into the throat as well. Pt exhibits no overt s/s on tested consistencies. Recommend advancing to full liquid consistency, largely due to discomfort. Pt also reports possible esophageal issues, indicating foods are "slow going down". ST will continue to follow to assess diet tolerance. Anticipate easily advancing diet as pain subsides. Recommend consideration of regular barium swallow to evaluate esophageal motility in the near future.   SLP Visit  Diagnosis: Dysphagia, unspecified (R13.10)    Aspiration Risk  Mild aspiration risk;Risk for inadequate nutrition/hydration    Diet Recommendation Other (Comment)(full liquid)   Liquid Administration via: Spoon;Cup;Straw Medication Administration: (as tolerated with current level of pain) Supervision: Patient able to self feed Compensations: Slow rate;Small sips/bites Postural Changes: Seated upright at 90 degrees;Remain upright for at least 30 minutes after po intake    Other  Recommendations Oral Care Recommendations: Oral care BID   Follow up Recommendations (TBD)      Frequency and Duration min 1 x/week  2 weeks       Prognosis Prognosis for Safe Diet Advancement: Good Barriers to Reach Goals: Other (Comment)(pain)      Swallow Study   General Date of Onset: 06/27/19 HPI: 64yo female admitted 06/27/19 with hypotension, n/v, poor po intake. Pt found to have gallstones, septic shock due to obstructive uropathy/pyelonephritis and COVD19. Pt developed AKI, resulting in CRRT. PMH: HTN, DM, heart disease, seizures. CXR = patchy ill-defined areas of airspace consolidation and interstitial prominence scattered throughout the lungs bilaterally Type of Study: Bedside Swallow Evaluation Previous Swallow Assessment: none Diet Prior to this Study: Thin liquids;Other (Comment)(clear liquids) Temperature Spikes Noted: No Respiratory Status: Room air History of Recent Intubation: No Behavior/Cognition: Alert;Cooperative;Pleasant mood Oral Cavity Assessment: Other (comment)(unable to fully visualize due to sores on lips) Oral Care Completed by SLP: No Oral Cavity - Dentition: Adequate natural dentition Vision: Functional for self-feeding Self-Feeding Abilities: Able to feed self Patient Positioning: Upright in chair Baseline Vocal Quality: Low vocal intensity Volitional Cough: Weak Volitional Swallow: Able to elicit    Oral/Motor/Sensory Function WFL strength and function. Oral  cavity significantly  tender due to sores  Ice Chips Ice chips: Not tested   Thin Liquid Thin Liquid: Within functional limits Presentation: Straw    Nectar Thick Nectar Thick Liquid: Within functional limits Presentation: Straw   Honey Thick Honey Thick Liquid: Not tested   Puree Puree: Within functional limits Presentation: Spoon   Solid     Solid: Not tested     Amber Bowman, [facility], [place] Speech Language Pathologist Office: [phone] Pager: [phone]  Shonna Chock 07/06/2019,10:19 AM

## 2019-07-06 NOTE — Progress Notes (Signed)
Patient ID: Amber Bowman, female   DOB: 02-16-1956, 64 y.o.   MRN: [id number] [facility] Progress Note   Assessment/ Plan:   1. Acute kidney Injury: likely ATN from sepsis/shock and a component of urinary obstruction- s/p left percutaneous nephrostomy tube placement on 4/22.  Nonoliguric with rise of creatinine as expected following discontinuation of CRRT however without indications to resume renal replacement therapy.  We will continue to follow for renal recovery.  2. Volume overload: Improved with ultrafiltration/hemodialysis and now will allow auto diuresis and monitoring for renal recovery. 3. E. Coli bacteremia and sepsis- likely of urinary source and on ampicillin for specific coverage.  Seen earlier by hematology with regards to her profound leukocytosis that was thought to be leukemoid reaction with acute illness/corticosteroids.  Leukocytosis now improving. 4. Left ureteral calculus with hydronephrosis: s/p PCN with decent drainage. 5. Seizure disorder: Remains seizure-free and will need to revisit continuing Topamax.  Subjective:   Without acute events overnight, urine output continues to improve.  Some discrepant weights noted this morning.  Objective:   BP (!) 134/55   Pulse (!) 108   Temp 98.9 F (37.2 C) (Axillary)   Resp 17   Ht 5\' 4"  (1.626 m)   Wt 69.9 kg   LMP 02/11/2008   SpO2 100%   BMI 26.45 kg/m   Intake/Output Summary (Last 24 hours) at 07/06/2019 1207 Last data filed at 07/06/2019 0900 Gross per 24 hour  Intake 3329.28 ml  Output 1372 ml  Net 1957.28 ml   Weight change: -3.9 kg  Physical Exam: Gen: Resting comfortably in recliner, NGT in situ.  Lethargic with minimal interaction. [id number] regular rhythm, normal rate, S1 and S2 normal  Resp:Clear to auscultation, no rales/rhonchi [id number], obese, with left sided nephrostomy tube in place Ext: Trace bilateral ankle edema with ischemic toe tips-2 on the left side and 1 on the right  side  Imaging: No results found.  Labs: BMET Recent Labs  Lab 07/03/19 0441 07/03/19 1600 07/04/19 0500 07/04/19 1600 07/05/19 0425 07/05/19 1600 07/06/19 0314  NA 134* 138 137 134* 138 137 135  136  K 4.2 4.3 4.5 4.2 4.5 4.4 4.3  4.3  CL 101 104 104 101 103 103 102  102  CO2 26 27 25 25 27 24 24  25   GLUCOSE 141* 136* 146* 128* 138* 147* 131*  125*  BUN 32* 35* 33* 30* 27* 30* 42*  42*  CREATININE 1.37* 1.45* 1.32* 1.21* 1.28* 1.45* 2.17*  2.18*  CALCIUM 8.1* 7.8* 7.5* 7.6* 7.9* 7.8* 7.6*  7.5*  PHOS 1.5* 1.9* 2.1* 2.5 2.3* 3.1 4.4   CBC Recent Labs  Lab 07/03/19 0441 07/04/19 0500 07/05/19 0425 07/06/19 0314  WBC 102.0* 92.6* 74.1* 50.3*  NEUTROABS 67.3* 63.1* 54.8* 33.8*  HGB 11.9* 9.8* 9.4* 8.6*  HCT 34.8* 29.8* 28.7* 26.6*  MCV 91.3 94.0 95.0 96.7  PLT 72* 83* 116* 169    Medications:    . Chlorhexidine Gluconate Cloth  6 each Topical Daily  . feeding supplement (PRO-STAT SUGAR FREE 64)  30 mL Per Tube BID  . felbamate  300 mg Per Tube Q12H  . heparin injection (subcutaneous)  5,000 Units Subcutaneous Q8H  . insulin aspart  0-9 Units Subcutaneous Q4H  . levETIRAcetam  500 mg Per Tube BID  . mouth rinse  15 mL Mouth Rinse BID  . melatonin  3 mg Oral QHS  . midodrine  10 mg Oral TID WC  . multivitamin  15  mL Per Tube Daily  . pantoprazole sodium  40 mg Per Tube QHS  . sodium chloride flush  10-40 mL Intracatheter Q12H  . topiramate  200 mg Per Tube BID   Elmarie Shiley, MD 07/06/2019, 12:07 PM

## 2019-07-06 NOTE — Progress Notes (Signed)
[place] Progress Note Patient Name: Amber Bowman DOB: [birth year] MRN: [id number]   Date of Service  07/06/2019  HPI/Events of Note  Pain - Fentanyl 25 mcg IV not holding her pain. AST and ALT are both normal.   eICU Interventions  Will order: 1. Norco 5 mg/ 325 mg per tube Q 6 hours PRN pain.      Intervention Category Major Interventions: Other:  Lysle Dingwall 07/06/2019, 8:43 PM

## 2019-07-06 NOTE — Progress Notes (Signed)
Pt handed RN 2 bank cards from [employer] and directed RN to deliver them to significant other after patient calls him. Pt called Ludwig Lean. 2 bank cards were handed to [facility] at the entrance.

## 2019-07-06 NOTE — Progress Notes (Signed)
Nephrostomy tube output color has changed from a dark yellow with a brown tint to pink tinged color. Will continue to monitor.

## 2019-07-06 NOTE — Progress Notes (Signed)
[place] Progress Note Patient Name: Amber Bowman DOB: 1955/08/24 MRN: [id number]   Date of Service  07/06/2019  HPI/Events of Note  Calcium = 7.6 which corrects to 9.2 (Normal) given Albumin = 2.0.   eICU Interventions  No intervention indicated.      Intervention Category Major Interventions: Electrolyte abnormality - evaluation and management  [name] 07/06/2019, 6:05 AM

## 2019-07-06 NOTE — Progress Notes (Addendum)
[facility] for Infectious Disease    Date of Admission:  06/27/2019   Total days of antibiotics 10/day 4 ampicillin           ID: Amber Bowman is a 64 y.o. female with severe sepsis from nephrolithiasis/pyelo s/p left perc neph c/b ecoli bacteremia and ATN requiring CRRT  & who also was found to have covid +, with little respiratory symptoms Active Problems:   Acute renal failure (ARF) (HCC)    Subjective: Afebrile, feeling weak. No longer needing oxygen. Recovering some kidney function, not needing CRRT  Labs reveal -- still down trending wbc  Medications:  . Chlorhexidine Gluconate Cloth  6 each Topical Daily  . feeding supplement (PRO-STAT SUGAR FREE 64)  30 mL Per Tube BID  . felbamate  300 mg Per Tube Q12H  . heparin injection (subcutaneous)  5,000 Units Subcutaneous Q8H  . insulin aspart  0-9 Units Subcutaneous Q4H  . levETIRAcetam  500 mg Per Tube BID  . mouth rinse  15 mL Mouth Rinse BID  . melatonin  3 mg Oral QHS  . midodrine  10 mg Oral TID WC  . multivitamin  15 mL Per Tube Daily  . pantoprazole sodium  40 mg Per Tube QHS  . sodium chloride flush  10-40 mL Intracatheter Q12H  . topiramate  200 mg Per Tube BID    Objective: Vital signs in last 24 hours: Temp:  [98.2 F (36.8 C)-99.3 F (37.4 C)] 98.9 F (37.2 C) (04/29 1200) Pulse Rate:  [78-108] 91 (04/29 1300) Resp:  [14-29] 16 (04/29 1300) BP: (93-139)/(27-104) 122/53 (04/29 1300) SpO2:  [91 %-100 %] 97 % (04/29 1300) Weight:  [69.9 kg] 69.9 kg (04/29 0700)  Physical Exam  Constitutional:  oriented to person, place, and time. appears well-developed and well-nourished. Sitting in chair HENT: [place]/AT, PERRLA, no scleral icterus Mouth/Throat: Oropharynx is clear and moist. No oropharyngeal exudate. Desquamating lip lesion Cardiovascular: Normal rate, regular rhythm and normal heart sounds. Exam reveals no gallop and no friction rub.  No murmur heard.  Pulmonary/Chest: Effort normal and breath  sounds normal. No respiratory distress.  has no wheezes.  Abdominal: Soft. Bowel sounds are normal.  exhibits no distension. There is no tenderness.  Skin: Skin is warm and dry. No rash noted. No erythema.    Lab Results Recent Labs    07/05/19 0425 07/05/19 0425 07/05/19 1600 07/06/19 0314  WBC 74.1*  --   --  50.3*  HGB 9.4*  --   --  8.6*  HCT 28.7*  --   --  26.6*  NA 138   < > 137 135  136  K 4.5   < > 4.4 4.3  4.3  CL 103   < > 103 102  102  CO2 27   < > 24 24  25   BUN 27*   < > 30* 42*  42*  CREATININE 1.28*   < > 1.45* 2.17*  2.18*   < > = values in this interval not displayed.   Liver Panel Recent Labs    07/05/19 0425 07/05/19 0425 07/05/19 1600 07/06/19 0314  PROT 5.9*  --   --  5.8*  ALBUMIN 2.2*   < > 2.3* 2.0*  2.0*  AST 21  --   --  19  ALT 27  --   --  23  ALKPHOS 219*  --   --  189*  BILITOT 0.6  --   --  0.7   < > =  values in this interval not displayed.   Microbiology: 4/25 blood cx NGTD 4/20 blood cx NGTD  Assessment/Plan: E.coli bacteremia 2/2 sepsis/obstructive uropathy/pyelo = currently on day 10 of 14. Continue with ampicillin  covid disease= was treated with remdesivir and dexamethasone but had markedly elevated leukamoid reaction with WBC of 100K, has steadily trended down. Not requiring any supplemental oxygen. Will need to be on isolation x 21d/  Will sign off. Call if questions  [facility] for Infectious Diseases Cell: [phone] Pager: [phone]  07/06/2019, 2:32 PM

## 2019-07-07 DIAGNOSIS — N179 Acute kidney failure, unspecified: Secondary | ICD-10-CM | POA: Diagnosis not present

## 2019-07-07 LAB — CBC WITH DIFFERENTIAL/PLATELET
Abs Immature Granulocytes: 3.36 10*3/uL — ABNORMAL HIGH (ref 0.00–0.07)
Basophils Absolute: 0.2 10*3/uL — ABNORMAL HIGH (ref 0.0–0.1)
Basophils Relative: 1 %
Eosinophils Absolute: 0.4 10*3/uL (ref 0.0–0.5)
Eosinophils Relative: 2 %
HCT: 23.6 % — ABNORMAL LOW (ref 36.0–46.0)
Hemoglobin: 7.5 g/dL — ABNORMAL LOW (ref 12.0–15.0)
Immature Granulocytes: 11 %
Lymphocytes Relative: 12 %
Lymphs Abs: 3.5 10*3/uL (ref 0.7–4.0)
MCH: 30.7 pg (ref 26.0–34.0)
MCHC: 31.8 g/dL (ref 30.0–36.0)
MCV: 96.7 fL (ref 80.0–100.0)
Monocytes Absolute: 2 10*3/uL — ABNORMAL HIGH (ref 0.1–1.0)
Monocytes Relative: 7 %
Neutro Abs: 19.9 10*3/uL — ABNORMAL HIGH (ref 1.7–7.7)
Neutrophils Relative %: 67 %
Platelets: 221 10*3/uL (ref 150–400)
RBC: 2.44 MIL/uL — ABNORMAL LOW (ref 3.87–5.11)
RDW: 15.3 % (ref 11.5–15.5)
WBC: 29.4 10*3/uL — ABNORMAL HIGH (ref 4.0–10.5)
nRBC: 0.3 % — ABNORMAL HIGH (ref 0.0–0.2)

## 2019-07-07 LAB — GLUCOSE, CAPILLARY
Glucose-Capillary: 102 mg/dL — ABNORMAL HIGH (ref 70–99)
Glucose-Capillary: 103 mg/dL — ABNORMAL HIGH (ref 70–99)
Glucose-Capillary: 105 mg/dL — ABNORMAL HIGH (ref 70–99)
Glucose-Capillary: 117 mg/dL — ABNORMAL HIGH (ref 70–99)
Glucose-Capillary: 84 mg/dL (ref 70–99)
Glucose-Capillary: 93 mg/dL (ref 70–99)
Glucose-Capillary: 96 mg/dL (ref 70–99)
Glucose-Capillary: 97 mg/dL (ref 70–99)

## 2019-07-07 LAB — RENAL FUNCTION PANEL
Albumin: 2 g/dL — ABNORMAL LOW (ref 3.5–5.0)
Albumin: 2.2 g/dL — ABNORMAL LOW (ref 3.5–5.0)
Anion gap: 10 (ref 5–15)
Anion gap: 10 (ref 5–15)
BUN: 57 mg/dL — ABNORMAL HIGH (ref 8–23)
BUN: 53 mg/dL — ABNORMAL HIGH (ref 8–23)
CO2: 23 mmol/L (ref 22–32)
CO2: 21 mmol/L — ABNORMAL LOW (ref 22–32)
Calcium: 7.4 mg/dL — ABNORMAL LOW (ref 8.9–10.3)
Calcium: 7.6 mg/dL — ABNORMAL LOW (ref 8.9–10.3)
Chloride: 103 mmol/L (ref 98–111)
Chloride: 104 mmol/L (ref 98–111)
Creatinine, Ser: 2.53 mg/dL — ABNORMAL HIGH (ref 0.44–1.00)
Creatinine, Ser: 2.68 mg/dL — ABNORMAL HIGH (ref 0.44–1.00)
GFR calc Af Amer: 22 mL/min — ABNORMAL LOW (ref >60)
GFR calc Af Amer: 21 mL/min — ABNORMAL LOW (ref >60)
GFR calc non Af Amer: 19 mL/min — ABNORMAL LOW (ref >60)
GFR calc non Af Amer: 18 mL/min — ABNORMAL LOW (ref >60)
Glucose, Bld: 127 mg/dL — ABNORMAL HIGH (ref 70–99)
Glucose, Bld: 109 mg/dL — ABNORMAL HIGH (ref 70–99)
Phosphorus: 6.1 mg/dL — ABNORMAL HIGH (ref 2.5–4.6)
Phosphorus: 6.1 mg/dL — ABNORMAL HIGH (ref 2.5–4.6)
Potassium: 3.9 mmol/L (ref 3.5–5.1)
Potassium: 3.8 mmol/L (ref 3.5–5.1)
Sodium: 136 mmol/L (ref 135–145)
Sodium: 135 mmol/L (ref 135–145)

## 2019-07-07 LAB — BASIC METABOLIC PANEL
Anion gap: 10 (ref 5–15)
BUN: 57 mg/dL — ABNORMAL HIGH (ref 8–23)
CO2: 23 mmol/L (ref 22–32)
Calcium: 7.2 mg/dL — ABNORMAL LOW (ref 8.9–10.3)
Chloride: 104 mmol/L (ref 98–111)
Creatinine, Ser: 2.59 mg/dL — ABNORMAL HIGH (ref 0.44–1.00)
GFR calc Af Amer: 22 mL/min — ABNORMAL LOW (ref >60)
GFR calc non Af Amer: 19 mL/min — ABNORMAL LOW (ref >60)
Glucose, Bld: 123 mg/dL — ABNORMAL HIGH (ref 70–99)
Potassium: 3.9 mmol/L (ref 3.5–5.1)
Sodium: 137 mmol/L (ref 135–145)

## 2019-07-07 LAB — APTT: aPTT: 20 seconds — ABNORMAL LOW (ref 24–36)

## 2019-07-07 LAB — CULTURE, BLOOD (ROUTINE X 2)
Culture: NO GROWTH
Culture: NO GROWTH
Special Requests: ADEQUATE
Special Requests: ADEQUATE

## 2019-07-07 MED ORDER — BACITRACIN-NEOMYCIN-POLYMYXIN 400-5-5000 EX OINT: TOPICAL_OINTMENT | CUTANEOUS | Status: DC | PRN

## 2019-07-07 MED ORDER — MIDAZOLAM HCL 2 MG/2ML IJ SOLN
1.0000 mg | Freq: Once | INTRAMUSCULAR | Status: AC
  Administered 2019-07-07: 1 mg via INTRAVENOUS
  Filled 2019-07-07: qty 2

## 2019-07-07 MED ORDER — PRO-STAT SUGAR FREE PO LIQD
30.0000 mL | Freq: Two times a day (BID) | ORAL | Status: DC
  Administered 2019-07-07 – 2019-07-09 (×3): 30 mL via ORAL
  Filled 2019-07-07 (×2): qty 30

## 2019-07-07 NOTE — Progress Notes (Signed)
Patient has been out of restraints since some point on day shift. Patient up in chair, following commands, and talkative. Patient wants to stay in chair for now. Chair alarm is active and no signs of distress. Have assisted patient to reposition in chair twice tonight. Provided pain management for sores on mouth, gums, and throat. Monitoring patient closely for signs of delirium tonight. Patient able to recall events, but states she did all of that to get nurse's attention. Therapeutic discussion about risks and appropriate behavior.

## 2019-07-07 NOTE — Progress Notes (Addendum)
Pt repeatedly c/o pain 7-10/10 throughout night. RN continued to administer pain medications prn as ordered. Pain is uncontrolled per pt.   Pt heard screaming from room. Pt found kneeling in bed, covered in blood. RN in room to assess, pt leans forward, head first off of bed, caught by this RN. Pt continues to push upper body into nurse attempting to get to floor. RN x2 to place pt back in bed. Agricultural consultant at bedside. HD cath, NG tube, and PIV found to be pulled out by pt. Pressure applied to HD site. Vitals signs obtained, stable. E-Link notified. Pt answered all orientation answers appropriately, pt stated she felt as if she was having a seizure again. No seizure like activity witnessed by staff. PIV x2 placed. Pt cleaned, linens changed. Restraints ordered by Dr. Oletta Darter. Restraints applied, pt educated on restraints. Will continue to monitor.   [id number]: Pt calm and resting when RN enters room. Pt then becomes very agitated, stating "she feels funny again." Pt continues to fight to get out of restraints. E-Link and charge RN notified. Versed 1mg  ordered. Versed slightly effective. Will continue to monitor.

## 2019-07-07 NOTE — Progress Notes (Signed)
[place] Progress Note Patient Name: Amber Bowman DOB: 04-Nov-1955 MRN: [id number]   Date of Service  07/07/2019  HPI/Events of Note  Anxiety - Patient states that she thinks that she is going to have a seizure. She is already on Keppra and Topamxa  eICU Interventions  Will order: 1. Versed 1 mg IV now.      Intervention Category Major Interventions: Delirium, psychosis, severe agitation - evaluation and management  Vanden Fawaz Eugene 07/07/2019, 5:57 AM

## 2019-07-07 NOTE — Progress Notes (Addendum)
Pt declining to have her NG tube replaced. Dr. Pietro Cassis made aware. Per MD, okay to leave out at this time. Pt not eating well d/t sore mouth/lips. Will encourage frequent oral care and PO intake.

## 2019-07-07 NOTE — Progress Notes (Signed)
PROGRESS NOTE  Amber Bowman  DOB: 19-Feb-1956  PCP: Huel Cote, NP [id number]  DOA: 06/27/2019  LOS: 10 days   Chief Complaint  Patient presents with  . Weakness  . Back Pain   Brief narrative: Amber Bowman is a 64 y.o. female with PMH of seizures, depression who has not been vaccinated for COVID-19. Patient presented to the ED on 06/27/2019 with complaint of left flank pain, nausea, vomiting, hypotension.  In the ED, noted to have fever of 101.3, heart rate 140 Lactic acid 2, AKI with creatinine of 1.7, normal WBC count. Her blood pressure dropped and she required IV resuscitation as well as Levophed. CT abd showed L ureteral stone w/ L hydro, normal R kidney.  Covid PCR positive.  Admitted to critical care. -CT abdomen pelvis showed left-sided obstructive uropathy with 10 mm stone in the proximal left ureter causing mild hydroureteronephrosis and perinephric stranding as well as multiple bilateral nonobstructing renal calculi measuring up to 6 mm. -Despite aggressive resuscitation with IV fluid and pressors, creatinine started worsening  -4/21 - Percutaneous placement of nephrostomy tube by IR.  HD catheter was placed as well. -4/22 -CRRT was started. -Blood cultures obtained on admission grew E. Coli. -4/23 off pressors -4/25 CT abdomen pelvis showed b/l effusions, patchy ASD, large gallstone at GB neck, b/l renal stones -4/26>4/28 making more urine, noted to have blisters in mouth -4/30 -patient was transferred out of ICU to hospitalist service.  See below for details  Subjective: Patient was seen and examined this morning.  Pleasant middle-aged Caucasian female.  Looks older for her age. Alert, awake, oriented x3. Continues to have painful sores on the lips and inside the mouth. Events from last night noted -patient was agitated, restless and pulled out her HD catheter, NG tube and peripheral IV. This morning, she was not bilateral wrist restraints but was  awake and oriented x3.  She states he might have gotten a little confused when she did that last night.  Assessment/Plan: Septic shock from E coli bacteremia Pyonephritis -Shock resolved with IV fluids, IV antibiotics, pressors.  Off pressors now.   -She was started on midodrine which continues. -E. coli obtaining blood culture. -Currently on ampicillin IV.  Bilateral nephrolithiasis Left hydronephrosis, s/p percutaneous nephrostomy by IR -Urology following. -Noted plan to reevaluate next week with possible elective treatment of stone as an outpatient  Acute kidney Injury -Likely due to ATN from septic shock, also contributed by urinary obstruction -Shock resolved.  Obstruction resolved. -Nephrology following. -Initially required CRRT -Off CRRT for more than now with decent urine output. -switch from Foley to [facility] catheter today. -Creatinine seems to have plateaued, 2.59 today. -Continue to monitor for renal recovery.  Delirium/acute encephalopathy -Patient remains alert, awake, oriented x3.  However she had a period of delirium and pulled out lines and tubes. -Seems super nice and apologetic this am.  -Discussed with RN about gradually softening the restraints.  Leukocytosis Leukemoid reaction -On admission, patient was in sepsis but WBC count was normal at 4.2.  Next 5 days, WBC count significantly worsened to 102 on 4/25.  Probably also contributed by dexamethasone used for Covid. -Improving significantly, 29 today.  Continue to monitor.  Thrombocytopenia -Secondary to sepsis.  Platelet lowest of 39, improving, 221 today.  COVID 19 infection without pneumonia -Diagnosed on 4/20.  Completed the course of remdesivir IV and dexamethasone -Not requiring supplemental oxygen. -21 days of isolation.  Cholelithiasis  No evidence of acute cholecystitis -Outpatient follow-up with  general surgery  Epilepsy -continue keppra and topamax  Mouth  sores/blisters -suspected COVID related -mild, no sign of systemic reaction -However difficulty with oral feeding. -NG feeding was ongoing which patient pulled out last night. -Continue Magic mouthwash.  Continue oral appetite as tolerated.  If unable to tolerate, may need NG tube again.  Physical deconditioning -PT eval.  Mobility: PT order in Code Status:  Full code  DVT prophylaxis:  Heparin subcu Antimicrobials:  IV ampicillin Fluid: dextrose drip because of poor appetite Diet: Full liquid  Consultants: Nephrology, urology, ID Family Communication:  Not at bedside  Status is: Inpatient  Remains inpatient appropriate because:Hemodynamically unstable, Persistent severe electrolyte disturbances, IV treatments appropriate due to intensity of illness or inability to take PO and Inpatient level of care appropriate due to severity of illness   Dispo: The patient is from: Home              Anticipated d/c is to: Home              Anticipated d/c date is: > 3 days              Patient currently is not medically stable to d/c.            Antimicrobials: Anti-infectives (From admission, onward)   Start     Dose/Rate Route Frequency Ordered Stop   07/06/19 1400  ampicillin (OMNIPEN) 2 g in sodium chloride 0.9 % 100 mL IVPB     2 g 300 mL/hr over 20 Minutes Intravenous Every 8 hours 07/06/19 1129     07/05/19 1800  ampicillin (OMNIPEN) 2 g in sodium chloride 0.9 % 100 mL IVPB  Status:  Discontinued     2 g 300 mL/hr over 20 Minutes Intravenous Every 6 hours 07/05/19 1507 07/06/19 1129   07/03/19 2200  ampicillin (OMNIPEN) 2 g in sodium chloride 0.9 % 100 mL IVPB  Status:  Discontinued     2 g 300 mL/hr over 20 Minutes Intravenous Every 8 hours 07/03/19 0856 07/05/19 1507   07/03/19 1000  ampicillin (OMNIPEN) 2 g in sodium chloride 0.9 % 100 mL IVPB  Status:  Discontinued     2 g 300 mL/hr over 20 Minutes Intravenous Every 8 hours 07/03/19 0844 07/03/19 0856   06/30/19  1000  remdesivir 100 mg in sodium chloride 0.9 % 100 mL IVPB     100 mg 200 mL/hr over 30 Minutes Intravenous Daily 06/30/19 0914 07/01/19 1037   06/28/19 1526  ceFAZolin (ANCEF) 2-4 GM/100ML-% IVPB    Note to Pharmacy: Hilma Favors   : cabinet override      06/28/19 1526 06/28/19 1525   06/28/19 1000  remdesivir 100 mg in sodium chloride 0.9 % 100 mL IVPB  Status:  Discontinued     100 mg 200 mL/hr over 30 Minutes Intravenous Daily 06/27/19 1814 06/27/19 1817   06/28/19 1000  remdesivir 100 mg in sodium chloride 0.9 % 100 mL IVPB  Status:  Discontinued     100 mg 200 mL/hr over 30 Minutes Intravenous Daily 06/27/19 1818 06/30/19 0914   06/28/19 0000  cefTRIAXone (ROCEPHIN) 2 g in sodium chloride 0.9 % 100 mL IVPB  Status:  Discontinued     2 g 200 mL/hr over 30 Minutes Intravenous Every 24 hours 06/27/19 1805 07/03/19 0844   06/27/19 1830  remdesivir 100 mg in sodium chloride 0.9 % 100 mL IVPB     100 mg 200 mL/hr over 30 Minutes Intravenous Every 1  hr x 2 06/27/19 1818 06/27/19 2029   06/27/19 1815  remdesivir 200 mg in sodium chloride 0.9% 250 mL IVPB  Status:  Discontinued     200 mg 580 mL/hr over 30 Minutes Intravenous Once 06/27/19 1814 06/27/19 1817   06/27/19 1400  cefTRIAXone (ROCEPHIN) 1 g in sodium chloride 0.9 % 100 mL IVPB  Status:  Discontinued     1 g 200 mL/hr over 30 Minutes Intravenous Every 24 hours 06/27/19 1359 06/27/19 1808        Code Status: Full Code   Diet Order            Diet full liquid Room service appropriate? Yes; Fluid consistency: Thin  Diet effective now              Infusions:  . sodium chloride    . sodium chloride Stopped (07/07/19 0315)  . ampicillin (OMNIPEN) IV 300 mL/hr at 07/07/19 1400  . dextrose 20 mL/hr at 07/07/19 1400  . feeding supplement (VITAL 1.5 CAL) 1,000 mL (07/07/19 0254)  . phenylephrine (NEO-SYNEPHRINE) Adult infusion 40 mcg/min (07/04/19 1900)    Scheduled Meds: . Chlorhexidine Gluconate Cloth  6 each  Topical Daily  . feeding supplement (PRO-STAT SUGAR FREE 64)  30 mL Per Tube BID  . felbamate  300 mg Per Tube Q12H  . heparin injection (subcutaneous)  5,000 Units Subcutaneous Q8H  . insulin aspart  0-9 Units Subcutaneous Q4H  . levETIRAcetam  500 mg Per Tube BID  . mouth rinse  15 mL Mouth Rinse BID  . melatonin  3 mg Oral QHS  . midodrine  10 mg Oral TID WC  . multivitamin  15 mL Per Tube Daily  . pantoprazole sodium  40 mg Per Tube QHS  . sodium chloride flush  10-40 mL Intracatheter Q12H  . topiramate  200 mg Per Tube BID    PRN meds: Place/Maintain arterial line **AND** sodium chloride, acetaminophen (TYLENOL) oral liquid 160 mg/5 mL, alteplase, fentaNYL (SUBLIMAZE) injection, guaiFENesin-codeine, heparin, HYDROcodone-acetaminophen, magic mouthwash w/lidocaine, ondansetron (ZOFRAN) IV, phenol, sodium chloride, sodium chloride flush   Objective: Vitals:   07/07/19 1200 07/07/19 1300  BP: 126/60 (!) 131/55  Pulse: 90 95  Resp: (!) 22 (!) 21  Temp:    SpO2: 97% 99%    Intake/Output Summary (Last 24 hours) at 07/07/2019 1526 Last data filed at 07/07/2019 1400 Gross per 24 hour  Intake 1735.6 ml  Output 2227 ml  Net -491.4 ml   Filed Weights   07/05/19 0416 07/06/19 0700 07/07/19 0448  Weight: 73.8 kg 69.9 kg 74.5 kg   Weight change: 4.6 kg Body mass index is 28.19 kg/m.   Physical Exam: General exam: Appears calm and comfortable to me this morning Skin: No rashes, lesions or ulcers. HEENT: Patient has oral and labial ulcers, healing  lungs: Clear to auscultation bilaterally CVS: Regular rate and rhythm, no murmur GI/Abd soft, nontender, nondistended, left nephrostomy tube placement, draining clear urine CNS: Alert, awake, oriented x3 Psychiatry: Mood appropriate this morning Extremities: No pedal edema, no calf tenderness  Data Review: I have personally reviewed the laboratory data and studies available.  Recent Labs  Lab 07/03/19 0441 07/04/19 0500  07/05/19 0425 07/06/19 0314 07/07/19 0300  WBC 102.0* 92.6* 74.1* 50.3* 29.4*  NEUTROABS 67.3* 63.1* 54.8* 33.8* 19.9*  HGB 11.9* 9.8* 9.4* 8.6* 7.5*  HCT 34.8* 29.8* 28.7* 26.6* 23.6*  MCV 91.3 94.0 95.0 96.7 96.7  PLT 72* 83* 116* 169 221   Recent Labs  Lab 07/01/19 0419 07/01/19 1618 07/02/19 0400 07/02/19 1524 07/05/19 0425 07/05/19 1600 07/06/19 0314 07/06/19 1700 07/07/19 0300  NA 135   < > 135   < > 138 137 135  136 135 137  136  K 4.2   < > 4.2   < > 4.5 4.4 4.3  4.3 4.1 3.9  3.9  CL 100   < > 102   < > 103 103 102  102 103 104  103  CO2 26   < > 26   < > 27 24 24  25 24 23  23   GLUCOSE 120*   < > 132*   < > 138* 147* 131*  125* 129* 123*  127*  BUN 31*   < > 30*   < > 27* 30* 42*  42* 52* 57*  57*  CREATININE 1.51*   < > 1.27*   < > 1.28* 1.45* 2.17*  2.18* 2.49* 2.59*  2.53*  CALCIUM 7.6*   < > 7.9*   < > 7.9* 7.8* 7.6*  7.5* 7.5* 7.2*  7.4*  MG 2.5*  --  2.5*  --   --   --   --   --   --   PHOS 2.1*   < > 1.3*   < > 2.3* 3.1 4.4 6.1* 6.1*   < > = values in this interval not displayed.    Signed, Terrilee Croak, MD [facility] Pager: [phone] (Secure Chat preferred). 07/07/2019

## 2019-07-07 NOTE — Progress Notes (Signed)
Patient ID: Amber Bowman, female   DOB: 16-Nov-1955, 64 y.o.   MRN: [id number]    Subjective: Pt off pressors.  Leukocytosis improving.  Objective: Vital signs in last 24 hours: Temp:  [97.6 F (36.4 C)-99.3 F (37.4 C)] 99 F (37.2 C) (04/30 0317) Pulse Rate:  [78-108] 103 (04/30 0600) Resp:  [15-28] 19 (04/30 0600) BP: (94-142)/(41-104) 128/57 (04/30 0600) SpO2:  [91 %-100 %] 99 % (04/30 0600) Weight:  [69.9 kg-74.5 kg] 74.5 kg (04/30 0448)  Intake/Output from previous day: 04/29 0701 - 04/30 0700 In: 2342 [P.O.:648; I.V.:615.6; NG/GT:780; IV Piggyback:298.4] Out: 2020 [Urine:1820; Stool:200] Intake/Output this shift: Total I/O In: 522.4 [P.O.:50; I.V.:272.4; IV Piggyback:200] Out: 875 [Urine:875]  Lab Results: Recent Labs    07/05/19 0425 07/06/19 0314 07/07/19 0300  HGB 9.4* 8.6* 7.5*  HCT 28.7* 26.6* 23.6*   CBC Latest Ref Rng & Units 07/07/2019 07/06/2019 07/05/2019  WBC 4.0 - 10.5 K/uL 29.4(H) 50.3(HH) 74.1(HH)  Hemoglobin 12.0 - 15.0 g/dL 7.5(L) 8.6(L) 9.4(L)  Hematocrit 36.0 - 46.0 % 23.6(L) 26.6(L) 28.7(L)  Platelets 150 - 400 K/uL 221 169 116(L)     BMET Recent Labs    07/06/19 1700 07/07/19 0300  NA 135 137  136  K 4.1 3.9  3.9  CL 103 104  103  CO2 24 23  23   GLUCOSE 129* 123*  127*  BUN 52* 57*  57*  CREATININE 2.49* 2.59*  2.53*  CALCIUM 7.5* 7.2*  7.4*     Studies/Results: No results found.  Assessment/Plan: Left ureteral stone/sepsis: Pt showing signs of improvement this week with increased UOP and resolving leukocytosis.  Will re-evaluate next week and determine plans for elective treatment of stone as outpatient once acute illness is resolved.  Continue left nephrostomy drainage and culture specific antibiotic therapy.   LOS: 10 days   Dutch Gray 07/07/2019, 6:57 AM

## 2019-07-07 NOTE — Evaluation (Signed)
Physical Therapy Evaluation Patient Details Name: Amber Bowman MRN: [id number] DOB: 06/02/1955 Today's Date: 07/07/2019   History of Present Illness  Pt admitted with severe sepsis from nephrolithiasis/Pyelo s/p L perc neph c/b ecoli bactermia and ATN requirring CRRT - CRRT now dc.  Pt with hx of Epilepsy, and Osteopenia  Clinical Impression  Pt admitted as above and presenting with functional mobility limitations 2* generalized weakness, balance deficits and limited endurance 2* dizziness with OOB activity.  Pt is motivated and should progress to dc home with assist of significant other.    Follow Up Recommendations Home health PT    Equipment Recommendations  None recommended by PT    Recommendations for Other Services OT consult     Precautions / Restrictions Precautions Precautions: Fall Restrictions Weight Bearing Restrictions: No      Mobility  Bed Mobility Overal bed mobility: Needs Assistance Bed Mobility: Supine to Sit     Supine to sit: Min assist     General bed mobility comments: Increased time and min assist 2* rectal tube  Transfers Overall transfer level: Needs assistance Equipment used: Rolling walker (2 wheeled) Transfers: Sit to/from Stand Sit to Stand: Min assist;+2 physical assistance;+2 safety/equipment;From elevated surface         General transfer comment: cues for LE management and use of UEs to self assist.  Physical assist to bring wt up and fwd and to balance in standing  Ambulation/Gait Ambulation/Gait assistance: Min assist;+2 physical assistance;+2 safety/equipment Gait Distance (Feet): 17 Feet Assistive device: Rolling walker (2 wheeled) Gait Pattern/deviations: Step-to pattern;Step-through pattern;Decreased step length - right;Decreased step length - left;Shuffle;Trunk flexed Gait velocity: decr   General Gait Details: cues for posture and position from RW - distance ltd by c/o dizziness - BP on sitting 125/60  Stairs             Wheelchair Mobility    Modified Rankin (Stroke Patients Only)       Balance Overall balance assessment: Needs assistance Sitting-balance support: Bilateral upper extremity supported;Feet supported Sitting balance-Leahy Scale: Fair     Standing balance support: Bilateral upper extremity supported Standing balance-Leahy Scale: Poor                               Pertinent Vitals/Pain Pain Assessment: Faces Faces Pain Scale: Hurts even more Pain Location: lips, mouth, throat Pain Descriptors / Indicators: Sore;Tender Pain Intervention(s): Limited activity within patient's tolerance;Monitored during session    Home Living Family/patient expects to be discharged to:: Private residence Living Arrangements: Spouse/significant other Available Help at Discharge: Family Type of Home: House Home Access: Stairs to enter   Technical brewer of Steps: 1 Home Layout: One level Home Equipment: Environmental consultant - 2 wheels;Bedside commode;Cane - single point Additional Comments: Pt inconsistent with details    Prior Function Level of Independence: Independent         Comments: Pt states she walked 3 miles a day     Hand Dominance        Extremity/Trunk Assessment   Upper Extremity Assessment Upper Extremity Assessment: Overall WFL for tasks assessed    Lower Extremity Assessment Lower Extremity Assessment: Generalized weakness       Communication   Communication: No difficulties  Cognition Arousal/Alertness: Awake/alert Behavior During Therapy: WFL for tasks assessed/performed;Impulsive Overall Cognitive Status: Within Functional Limits for tasks assessed  General Comments      Exercises     Assessment/Plan    PT Assessment Patient needs continued PT services  PT Problem List Decreased strength;Decreased activity tolerance;Decreased balance;Decreased mobility;Decreased knowledge of use of  DME;Decreased cognition;Decreased safety awareness       PT Treatment Interventions DME instruction;Gait training;Stair training;Functional mobility training;Therapeutic exercise;Therapeutic activities;Balance training;Patient/family education    PT Goals (Current goals can be found in the Care Plan section)  Acute Rehab PT Goals Patient Stated Goal: Regain IND and go HOME PT Goal Formulation: With patient Time For Goal Achievement: 07/21/19 Potential to Achieve Goals: Good    Frequency Min 3X/week   Barriers to discharge        Co-evaluation               AM-PAC PT "6 Clicks" Mobility  Outcome Measure Help needed turning from your back to your side while in a flat bed without using bedrails?: A Little Help needed moving from lying on your back to sitting on the side of a flat bed without using bedrails?: A Little Help needed moving to and from a bed to a chair (including a wheelchair)?: A Little Help needed standing up from a chair using your arms (e.g., wheelchair or bedside chair)?: A Lot Help needed to walk in hospital room?: A Lot Help needed climbing 3-5 steps with a railing? : A Lot 6 Click Score: 15    End of Session Equipment Utilized During Treatment: Gait belt Activity Tolerance: Patient tolerated treatment well;Other (comment)(ltd by dizziness) Patient left: in chair;with call bell/phone within reach;with chair alarm set Nurse Communication: Mobility status PT Visit Diagnosis: Unsteadiness on feet (R26.81);Muscle weakness (generalized) (M62.81);Difficulty in walking, not elsewhere classified (R26.2)    Time: [id number] PT Time Calculation (min) (ACUTE ONLY): 45 min   Charges:   PT Evaluation $PT Eval Low Complexity: 1 Low PT Treatments $Gait Training: 8-22 mins $Therapeutic Activity: 8-22 mins        Debe Coder PT Acute Rehabilitation Services Pager [phone] Office [phone]   Adlean Hardeman 07/07/2019, 5:14 PM

## 2019-07-07 NOTE — Progress Notes (Signed)
Patient ID: Amber Bowman, female   DOB: 1955-07-16, 64 y.o.   MRN: [id number] [facility] Progress Note   Assessment/ Plan:   1. Acute kidney Injury: likely ATN from sepsis/shock and a component of urinary obstruction- s/p left percutaneous nephrostomy tube placement on 4/22.  Off CRRT now for >24 hours with decent urine output and seeming plateau of creatinine and without any acute dialysis needs.  We will continue to monitor expectantly for renal recovery.  2. Volume overload: Improved with ultrafiltration/hemodialysis and now will allow auto diuresis and monitoring for renal recovery. 3. E. Coli bacteremia and sepsis- likely of urinary source and on ampicillin for specific coverage.  Seen earlier by hematology with regards to her profound leukocytosis that was thought to be leukemoid reaction with acute illness/corticosteroids.  Leukocytosis improving consistently. 4. Left ureteral calculus with hydronephrosis: s/p PCN with decent drainage. 5. Seizure disorder: On antiepileptic medications, remains seizure-free.  Subjective:   She had an episode of delirium yesterday and pulled out dialysis catheter.  Objective:   BP 126/60   Pulse 90   Temp 99 F (37.2 C) (Axillary)   Resp (!) 22   Ht 5\' 4"  (1.626 m)   Wt 74.5 kg   LMP 02/11/2008   SpO2 97%   BMI 28.19 kg/m   Intake/Output Summary (Last 24 hours) at 07/07/2019 1346 Last data filed at 07/07/2019 1200 Gross per 24 hour  Intake 1403.61 ml  Output 2027 ml  Net -623.39 ml   Weight change: 4.6 kg  Physical Exam: Gen: Resting comfortably in bed, remains in COVID-19 isolation. [id number] regular rhythm, normal rate, S1 and S2 normal  Resp:Clear to auscultation, no rales/rhonchi [id number], obese, with left sided nephrostomy tube in place Ext: Trace bilateral ankle edema with ischemic toe tips-2 on the left side and 1 on the right side  Imaging: No results found.  Labs: BMET Recent Labs  Lab 07/04/19 0500  07/04/19 1600 07/05/19 0425 07/05/19 1600 07/06/19 0314 07/06/19 1700 07/07/19 0300  NA 137 134* 138 137 135  136 135 137  136  K 4.5 4.2 4.5 4.4 4.3  4.3 4.1 3.9  3.9  CL 104 101 103 103 102  102 103 104  103  CO2 25 25 27 24 24  25 24 23  23   GLUCOSE 146* 128* 138* 147* 131*  125* 129* 123*  127*  BUN 33* 30* 27* 30* 42*  42* 52* 57*  57*  CREATININE 1.32* 1.21* 1.28* 1.45* 2.17*  2.18* 2.49* 2.59*  2.53*  CALCIUM 7.5* 7.6* 7.9* 7.8* 7.6*  7.5* 7.5* 7.2*  7.4*  PHOS 2.1* 2.5 2.3* 3.1 4.4 6.1* 6.1*   CBC Recent Labs  Lab 07/04/19 0500 07/05/19 0425 07/06/19 0314 07/07/19 0300  WBC 92.6* 74.1* 50.3* 29.4*  NEUTROABS 63.1* 54.8* 33.8* 19.9*  HGB 9.8* 9.4* 8.6* 7.5*  HCT 29.8* 28.7* 26.6* 23.6*  MCV 94.0 95.0 96.7 96.7  PLT 83* 116* 169 221    Medications:    . Chlorhexidine Gluconate Cloth  6 each Topical Daily  . feeding supplement (PRO-STAT SUGAR FREE 64)  30 mL Per Tube BID  . felbamate  300 mg Per Tube Q12H  . heparin injection (subcutaneous)  5,000 Units Subcutaneous Q8H  . insulin aspart  0-9 Units Subcutaneous Q4H  . levETIRAcetam  500 mg Per Tube BID  . mouth rinse  15 mL Mouth Rinse BID  . melatonin  3 mg Oral QHS  . midodrine  10 mg Oral TID  WC  . multivitamin  15 mL Per Tube Daily  . pantoprazole sodium  40 mg Per Tube QHS  . sodium chloride flush  10-40 mL Intracatheter Q12H  . topiramate  200 mg Per Tube BID   Elmarie Shiley, MD 07/07/2019, 1:46 PM

## 2019-07-07 NOTE — Progress Notes (Signed)
[facility] Progress Note Patient Name: Amber Bowman DOB: 05/25/1955 MRN: [id number]   Date of Service  07/07/2019  HPI/Events of Note  Patient pulled out R IJ HD chateter. Nursing holding pressure on site.   eICU Interventions  Will order: 1. Hold Heparin Cana until hemostasis obtained at site of removed R IJ HD catheter.  2. Bilateral soft wrist restraints.      Intervention Category Major Interventions: Other:  Shauna Bodkins Cornelia Copa 07/07/2019, 3:17 AM

## 2019-07-08 DIAGNOSIS — A4151 Sepsis due to Escherichia coli [E. coli]: Secondary | ICD-10-CM | POA: Diagnosis present

## 2019-07-08 DIAGNOSIS — D696 Thrombocytopenia, unspecified: Secondary | ICD-10-CM | POA: Diagnosis present

## 2019-07-08 DIAGNOSIS — G9341 Metabolic encephalopathy: Secondary | ICD-10-CM | POA: Diagnosis not present

## 2019-07-08 DIAGNOSIS — F39 Unspecified mood [affective] disorder: Secondary | ICD-10-CM | POA: Diagnosis present

## 2019-07-08 DIAGNOSIS — U071 COVID-19: Secondary | ICD-10-CM | POA: Diagnosis present

## 2019-07-08 DIAGNOSIS — R6521 Severe sepsis with septic shock: Secondary | ICD-10-CM | POA: Diagnosis present

## 2019-07-08 DIAGNOSIS — N10 Acute tubulo-interstitial nephritis: Secondary | ICD-10-CM | POA: Diagnosis not present

## 2019-07-08 DIAGNOSIS — E663 Overweight: Secondary | ICD-10-CM | POA: Diagnosis present

## 2019-07-08 DIAGNOSIS — I509 Heart failure, unspecified: Secondary | ICD-10-CM

## 2019-07-08 DIAGNOSIS — D72829 Elevated white blood cell count, unspecified: Secondary | ICD-10-CM | POA: Diagnosis present

## 2019-07-08 HISTORY — DX: Heart failure, unspecified: I50.9

## 2019-07-08 LAB — RENAL FUNCTION PANEL
Albumin: 2 g/dL — ABNORMAL LOW (ref 3.5–5.0)
Anion gap: 11 (ref 5–15)
BUN: 50 mg/dL — ABNORMAL HIGH (ref 8–23)
CO2: 21 mmol/L — ABNORMAL LOW (ref 22–32)
Calcium: 7.5 mg/dL — ABNORMAL LOW (ref 8.9–10.3)
Chloride: 104 mmol/L (ref 98–111)
Creatinine, Ser: 2.59 mg/dL — ABNORMAL HIGH (ref 0.44–1.00)
GFR calc Af Amer: 22 mL/min — ABNORMAL LOW (ref >60)
GFR calc non Af Amer: 19 mL/min — ABNORMAL LOW (ref >60)
Glucose, Bld: 104 mg/dL — ABNORMAL HIGH (ref 70–99)
Phosphorus: 5.6 mg/dL — ABNORMAL HIGH (ref 2.5–4.6)
Potassium: 3.6 mmol/L (ref 3.5–5.1)
Sodium: 136 mmol/L (ref 135–145)

## 2019-07-08 LAB — GLUCOSE, CAPILLARY
Glucose-Capillary: 95 mg/dL (ref 70–99)
Glucose-Capillary: 80 mg/dL (ref 70–99)
Glucose-Capillary: 91 mg/dL (ref 70–99)
Glucose-Capillary: 78 mg/dL (ref 70–99)
Glucose-Capillary: 95 mg/dL (ref 70–99)

## 2019-07-08 LAB — CBC WITH DIFFERENTIAL/PLATELET
Abs Immature Granulocytes: 1.25 10*3/uL — ABNORMAL HIGH (ref 0.00–0.07)
Basophils Absolute: 0.1 10*3/uL (ref 0.0–0.1)
Basophils Relative: 1 %
Eosinophils Absolute: 0.3 10*3/uL (ref 0.0–0.5)
Eosinophils Relative: 2 %
HCT: 24.5 % — ABNORMAL LOW (ref 36.0–46.0)
Hemoglobin: 7.6 g/dL — ABNORMAL LOW (ref 12.0–15.0)
Immature Granulocytes: 7 %
Lymphocytes Relative: 16 %
Lymphs Abs: 3.1 10*3/uL (ref 0.7–4.0)
MCH: 30.2 pg (ref 26.0–34.0)
MCHC: 31 g/dL (ref 30.0–36.0)
MCV: 97.2 fL (ref 80.0–100.0)
Monocytes Absolute: 1.4 10*3/uL — ABNORMAL HIGH (ref 0.1–1.0)
Monocytes Relative: 7 %
Neutro Abs: 12.7 10*3/uL — ABNORMAL HIGH (ref 1.7–7.7)
Neutrophils Relative %: 67 %
Platelets: 305 10*3/uL (ref 150–400)
RBC: 2.52 MIL/uL — ABNORMAL LOW (ref 3.87–5.11)
RDW: 15.2 % (ref 11.5–15.5)
WBC: 18.9 10*3/uL — ABNORMAL HIGH (ref 4.0–10.5)
nRBC: 0.1 % (ref 0.0–0.2)

## 2019-07-08 LAB — APTT: aPTT: 30 seconds (ref 24–36)

## 2019-07-08 LAB — MAGNESIUM: Magnesium: 2.3 mg/dL (ref 1.7–2.4)

## 2019-07-08 NOTE — Progress Notes (Signed)
PROGRESS NOTE  Amber Bowman WUJ:[id number] DOB: September 23, 1955 DOA: 06/27/2019 PCP: Huel Cote, NP  HPI/Recap of past 60 hours: 64 year old female with past medical history of seizures, depression admitted on 4/20 with septic shock secondary to pyelonephritis.  Patient was admitted to the critical care service.  Despite aggressive fluid resuscitation, pressor support and IV antibiotics, patient's creatinine continued to decline.  Nephrology was consulted.  Patient had a nephrostomy tube placed by interventional radiology as well as an hemodialysis catheter and on 4/22, CRRT was started.  By 4/23, patient able to be weaned off of pressors and by 4/30, able to make much more urine on her own and patient transferred out of ICU to the hospitalist service.  On night of 4/29, patient confused and agitated, accidentally pulled out her hemodialysis catheter.  No active bleeding.  Today, she is more alert.  She denies any complaints, other than some mucosal oral ulcerations, felt to be secondary to Covid.  She is tolerating clear liquid p.o. intake.  Creatinine continues to slowly improve.  She has not been on CRRT for several days.  Assessment/Plan: Principal Problem:   Septic shock due to Escherichia coli (HCC) from pyelonephritis: Patient met criteria for septic shock on admission given tachycardia, pyelonephritis source, lactic acidosis and persistent hypotension despite IV fluids requiring pressor support causing acute kidney injury.  Blood cultures grew out E. coli.  Patient continued on antibiotics.  Sepsis felt to be resolved.  Renal function not yet normalized although she is able to continue to make urine and creatinine slowly decreasing and not been on dialysis now for several days.  Nephrology continuing to follow. Active Problems:   Epilepsy [facility]): On Keppra and Topamax    Overweight (BMI 25.0-29.9): Patient meets criteria BMI greater than 25.    COVID-19 virus infection: Incidentally  diagnosed on admission.  Patient initially received IV remdesivir and dexamethasone.  She is considered contagious until 5/11.  No evidence of any respiratory compromise.  Oral ulcers felt to be likely related.    Acute metabolic encephalopathy: Intermittent.  May be component of steroids and ICU psychosis.  More alert now.  Continue to monitor closely.    Leukocytosis: Secondary to infection plus steroids.  Resolving.   Thrombocytopenia ([place]): Secondary to sepsis.  Platelet count now normalized.  Lowest point was at 29.   Code Status: Full code  Family Communication: Left message for husband  Disposition Plan: Continues to improve.  Transfer out of ICU to telemetry floor.  Continue to monitor.  Home once able to tolerating solid food, renal function improved and cleared by nephrology.  Will need home health PT   Consultants:  Urology  Critical care  Nephrology  Procedures:  Placement of HD catheter  Placement of percutaneous nephrostomy tube  Central line placement  CRRT  Antimicrobials:  IV Rocephin 4/20-4/26  IV ampicillin 4/26-present  IV remdesivir 4/20-4/24  DVT prophylaxis: Subcu heparin   Objective: Vitals:   07/08/19 1200 07/08/19 1300  BP: (!) 107/42 (!) 107/91  Pulse: 79 81  Resp: (!) 21 18  Temp: 98.5 F (36.9 C)   SpO2: 96% 100%    Intake/Output Summary (Last 24 hours) at 07/08/2019 1431 Last data filed at 07/08/2019 1400 Gross per 24 hour  Intake 1043.18 ml  Output 1400 ml  Net -356.82 ml   Filed Weights   07/06/19 0700 07/07/19 0448 07/08/19 0354  Weight: 69.9 kg 74.5 kg 74.4 kg   Body mass index is 28.15 kg/m.  Exam:  General: Alert and oriented x3, no acute distress  HEENT normocephalic, atraumatic, noted oral ulcerations, mucous membranes slightly dry  Neck: Supple, no JVD  Cardiovascular: Regular rate and rhythm, S1-S2  Respiratory: Clear to auscultation bilaterally  Abdomen: Soft, nontender, nondistended, positive  bowel sounds  Musculoskeletal: No clubbing or cyanosis, 1+ pitting edema from the knees down bilaterally  Skin: No skin breaks tears or lesions other than oral mucosal lesions  Psychiatry: Appropriate, no evidence of psychoses  Neuro: No focal deficits   Data Reviewed: CBC: Recent Labs  Lab 07/04/19 0500 07/05/19 0425 07/06/19 0314 07/07/19 0300 07/08/19 0217  WBC 92.6* 74.1* 50.3* 29.4* 18.9*  NEUTROABS 63.1* 54.8* 33.8* 19.9* 12.7*  HGB 9.8* 9.4* 8.6* 7.5* 7.6*  HCT 29.8* 28.7* 26.6* 23.6* 24.5*  MCV 94.0 95.0 96.7 96.7 97.2  PLT 83* 116* 169 221 932   Basic Metabolic Panel: Recent Labs  Lab 07/02/19 0400 07/02/19 1524 07/06/19 0314 07/06/19 1700 07/07/19 0300 07/07/19 1718 07/08/19 0217 07/08/19 1358  NA 135   < > 135  136 135 137  136 135 136  --   K 4.2   < > 4.3  4.3 4.1 3.9  3.9 3.8 3.6  --   CL 102   < > 102  102 103 104  103 104 104  --   CO2 26   < > 24  25 24 23  23  21* 21*  --   GLUCOSE 132*   < > 131*  125* 129* 123*  127* 109* 104*  --   BUN 30*   < > 42*  42* 52* 57*  57* 53* 50*  --   CREATININE 1.27*   < > 2.17*  2.18* 2.49* 2.59*  2.53* 2.68* 2.59*  --   CALCIUM 7.9*   < > 7.6*  7.5* 7.5* 7.2*  7.4* 7.6* 7.5*  --   MG 2.5*  --   --   --   --   --   --  2.3  PHOS 1.3*   < > 4.4 6.1* 6.1* 6.1* 5.6*  --    < > = values in this interval not displayed.   GFR: Estimated Creatinine Clearance: 21.7 mL/min (A) (by C-G formula based on SCr of 2.59 mg/dL (H)). Liver Function Tests: Recent Labs  Lab 07/02/19 0400 07/02/19 1524 07/03/19 0441 07/03/19 1600 07/05/19 0425 07/05/19 1600 07/06/19 0314 07/06/19 1700 07/07/19 0300 07/07/19 1718 07/08/19 0217  AST 36  --  25  --  21  --  19  --   --   --   --   ALT 42  --  34  --  27  --  23  --   --   --   --   ALKPHOS 235*  --  240*  --  219*  --  189*  --   --   --   --   BILITOT 0.7  --  0.7  --  0.6  --  0.7  --   --   --   --   PROT 5.9*  --  6.1*  --  5.9*  --  5.8*  --   --    --   --   ALBUMIN 2.2*  2.2*   < > 2.4*  2.3*   < > 2.2*   < > 2.0*  2.0* 2.0* 2.0* 2.2* 2.0*   < > = values in this interval not displayed.   No results for input(s):  LIPASE, AMYLASE in the last 168 hours. No results for input(s): AMMONIA in the last 168 hours. Coagulation Profile: No results for input(s): INR, PROTIME in the last 168 hours. Cardiac Enzymes: No results for input(s): CKTOTAL, CKMB, CKMBINDEX, TROPONINI in the last 168 hours. BNP (last 3 results) No results for input(s): PROBNP in the last 8760 hours. HbA1C: No results for input(s): HGBA1C in the last 72 hours. CBG: Recent Labs  Lab 07/07/19 2058 07/07/19 2330 07/08/19 0323 07/08/19 0809 07/08/19 1214  GLUCAP 97 96 95 80 91   Lipid Profile: No results for input(s): CHOL, HDL, LDLCALC, TRIG, CHOLHDL, LDLDIRECT in the last 72 hours. Thyroid Function Tests: No results for input(s): TSH, T4TOTAL, FREET4, T3FREE, THYROIDAB in the last 72 hours. Anemia Panel: No results for input(s): VITAMINB12, FOLATE, FERRITIN, TIBC, IRON, RETICCTPCT in the last 72 hours. Urine analysis:    Component Value Date/Time   COLORURINE YELLOW 06/28/2019 1418   APPEARANCEUR CLOUDY (A) 06/28/2019 1418   LABSPEC 1.025 06/28/2019 1418   PHURINE 5.0 06/28/2019 1418   GLUCOSEU NEGATIVE 06/28/2019 1418   HGBUR LARGE (A) 06/28/2019 1418   BILIRUBINUR MODERATE (A) 06/28/2019 1418   KETONESUR 5 (A) 06/28/2019 1418   PROTEINUR 100 (A) 06/28/2019 1418   UROBILINOGEN 0.2 02/12/2012 0842   NITRITE POSITIVE (A) 06/28/2019 1418   LEUKOCYTESUR MODERATE (A) 06/28/2019 1418   Sepsis Labs: @LABRCNTIP (procalcitonin:4,lacticidven:4)  ) Recent Results (from the past 240 hour(s))  Urine Culture     Status: None   Collection Time: 06/28/19  6:24 PM   Specimen: Urine, Random  Result Value Ref Range Status   Specimen Description   Final    URINE, RANDOM NEPHROSTOMY Performed at [facility], [place] [address], [place],  [place] [address]    Special Requests   Final    NONE Performed at [facility], [place] [address], [place], [place] [address]    Culture   Final    NO GROWTH Performed at [facility], [place] [address], [place], [place] [address]    Report Status 06/30/2019 FINAL  Final  Culture, blood (routine x 2)     Status: None   Collection Time: 07/02/19 11:32 AM   Specimen: BLOOD  Result Value Ref Range Status   Specimen Description   Final    BLOOD BLOOD LEFT HAND Performed at [facility] [address], [place], [place] [address]    Special Requests   Final    BOTTLES DRAWN AEROBIC ONLY Blood Culture adequate volume Performed at [facility] [address] [place], [place] [address]    Culture   Final    NO GROWTH 5 DAYS Performed at [facility], [place] [address] [place], [place] [address]    Report Status 07/07/2019 FINAL  Final  Culture, blood (routine x 2)     Status: None   Collection Time: 07/02/19 11:32 AM   Specimen: BLOOD  Result Value Ref Range Status   Specimen Description   Final    BLOOD BLOOD LEFT HAND Performed at [facility] [address], [place], [place] [address]    Special Requests   Final    BOTTLES DRAWN AEROBIC ONLY Blood Culture adequate volume Performed at [facility] [address] [place], [place] [address]    Culture   Final    NO GROWTH 5 DAYS Performed at [facility], [place] [address], [place], [place] [address]  Report Status 07/07/2019 FINAL  Final  C Difficile Quick Screen (NO PCR Reflex)     Status: None   Collection Time: 07/02/19 12:28 PM   Specimen: STOOL  Result Value Ref Range Status   C Diff antigen NEGATIVE NEGATIVE Final   C Diff toxin NEGATIVE NEGATIVE Final   C Diff interpretation No C. difficile detected.  Final    Comment: Performed at [facility], [place] [address], [place], [place] [address]       Studies: No results found.  Scheduled Meds: . Chlorhexidine Gluconate Cloth  6 each Topical Daily  . feeding supplement (PRO-STAT SUGAR FREE 64)  30 mL Oral BID  . felbamate  300 mg Per Tube Q12H  . heparin injection (subcutaneous)  5,000 Units Subcutaneous Q8H  . insulin aspart  0-9 Units Subcutaneous Q4H  . levETIRAcetam  500 mg Per Tube BID  . mouth rinse  15 mL Mouth Rinse BID  . melatonin  3 mg Oral QHS  . midodrine  10 mg Oral TID WC  . multivitamin  15 mL Per Tube Daily  . pantoprazole sodium  40 mg Per Tube QHS  . sodium chloride flush  10-40 mL Intracatheter Q12H  . topiramate  200 mg Per Tube BID    Continuous Infusions: . sodium chloride    . sodium chloride Stopped (07/07/19 0315)  . ampicillin (OMNIPEN) IV Stopped (07/08/19 9977)  . dextrose 20 mL/hr at 07/08/19 1400  . feeding supplement (VITAL 1.5 CAL) 1,000 mL (07/07/19 0254)  . phenylephrine (NEO-SYNEPHRINE) Adult infusion 40 mcg/min (07/04/19 1900)     LOS: 11 days     Annita Brod, MD [facility]   07/08/2019, 2:31 PM

## 2019-07-08 NOTE — Progress Notes (Addendum)
Patient c/o feeling tingling all over. Verified blood sugar not low. Calcium level at 7.5, not much different than before. Vital signs stable. Paged on call hospitalist provider. Patient able to move all extremities equally and without deficit. Patient able to assist RN in repositioning herself with minimal difficulty. Provided apple juice and ice to patient. Patient has not slept all night and RN expressed concerns to her that this may be why she feels this symptom. Awaiting response from provider, but do not expect changes to care plan. Patient acknowledged that she is emotionally distraught at being away from her significant other for this long. Provided active listening and emotional support.

## 2019-07-08 NOTE — Progress Notes (Signed)
Patient ID: Amber Bowman, female   DOB: 26-Mar-1955, 64 y.o.   MRN: [id number] [facility] Progress Note   Assessment/ Plan:   1. Acute kidney Injury: likely ATN from sepsis/shock and a component of urinary obstruction- s/p left percutaneous nephrostomy tube placement on 4/22.  Creatinine appears slightly better today with decent urine output; expect renal recovery is underway.  2. Volume overload: Improved with ultrafiltration/hemodialysis and now will allow auto diuresis and monitoring for renal recovery. 3. E. Coli bacteremia and sepsis- likely of urinary source and on ampicillin for specific coverage.  Seen earlier by hematology with regards to her profound leukocytosis that was thought to be leukemoid reaction with acute illness/corticosteroids.  Leukocytosis improving consistently. 4. Left ureteral calculus with hydronephrosis: s/p PCN with decent drainage. 5. Seizure disorder: On antiepileptic medications, remains seizure-free. 6.  Tingling of extremities: Unclear etiology; corrected calcium level is 9.1 and phosphorus elevated.  Without significant metabolic acidosis or potassium abnormality.  Subjective:   Continues to have episodes of acute illness delirium and complains of some tingling of her extremities.  Objective:   BP (!) 107/42   Pulse 79   Temp 98.5 F (36.9 C) (Oral)   Resp (!) 21   Ht 5\' 4"  (1.626 m)   Wt 74.4 kg   LMP 02/11/2008   SpO2 96%   BMI 28.15 kg/m   Intake/Output Summary (Last 24 hours) at 07/08/2019 1249 Last data filed at 07/08/2019 1200 Gross per 24 hour  Intake 1326.44 ml  Output 1600 ml  Net -273.56 ml   Weight change: -0.1 kg  Physical Exam: Gen: Sitting up comfortably in recliner, off restraints. [id number] regular rhythm, normal rate, S1 and S2 normal  Resp:Clear to auscultation, no rales/rhonchi [id number], obese, with left sided nephrostomy tube in place Ext: Trace bilateral ankle edema with ischemic toe tips-2 on the left side  and 1 on the right side  Imaging: No results found.  Labs: BMET Recent Labs  Lab 07/05/19 0425 07/05/19 1600 07/06/19 0314 07/06/19 1700 07/07/19 0300 07/07/19 1718 07/08/19 0217  NA 138 137 135  136 135 137  136 135 136  K 4.5 4.4 4.3  4.3 4.1 3.9  3.9 3.8 3.6  CL 103 103 102  102 103 104  103 104 104  CO2 27 24 24  25 24 23  23  21* 21*  GLUCOSE 138* 147* 131*  125* 129* 123*  127* 109* 104*  BUN 27* 30* 42*  42* 52* 57*  57* 53* 50*  CREATININE 1.28* 1.45* 2.17*  2.18* 2.49* 2.59*  2.53* 2.68* 2.59*  CALCIUM 7.9* 7.8* 7.6*  7.5* 7.5* 7.2*  7.4* 7.6* 7.5*  PHOS 2.3* 3.1 4.4 6.1* 6.1* 6.1* 5.6*   CBC Recent Labs  Lab 07/05/19 0425 07/06/19 0314 07/07/19 0300 07/08/19 0217  WBC 74.1* 50.3* 29.4* 18.9*  NEUTROABS 54.8* 33.8* 19.9* 12.7*  HGB 9.4* 8.6* 7.5* 7.6*  HCT 28.7* 26.6* 23.6* 24.5*  MCV 95.0 96.7 96.7 97.2  PLT 116* 169 221 305    Medications:    . Chlorhexidine Gluconate Cloth  6 each Topical Daily  . feeding supplement (PRO-STAT SUGAR FREE 64)  30 mL Oral BID  . felbamate  300 mg Per Tube Q12H  . heparin injection (subcutaneous)  5,000 Units Subcutaneous Q8H  . insulin aspart  0-9 Units Subcutaneous Q4H  . levETIRAcetam  500 mg Per Tube BID  . mouth rinse  15 mL Mouth Rinse BID  . melatonin  3 mg Oral  QHS  . midodrine  10 mg Oral TID WC  . multivitamin  15 mL Per Tube Daily  . pantoprazole sodium  40 mg Per Tube QHS  . sodium chloride flush  10-40 mL Intracatheter Q12H  . topiramate  200 mg Per Tube BID   Elmarie Shiley, MD 07/08/2019, 12:49 PM

## 2019-07-09 DIAGNOSIS — F39 Unspecified mood [affective] disorder: Secondary | ICD-10-CM

## 2019-07-09 DIAGNOSIS — N10 Acute tubulo-interstitial nephritis: Secondary | ICD-10-CM | POA: Diagnosis not present

## 2019-07-09 DIAGNOSIS — N179 Acute kidney failure, unspecified: Secondary | ICD-10-CM | POA: Diagnosis not present

## 2019-07-09 DIAGNOSIS — I5022 Chronic systolic (congestive) heart failure: Secondary | ICD-10-CM | POA: Diagnosis present

## 2019-07-09 DIAGNOSIS — A4151 Sepsis due to Escherichia coli [E. coli]: Secondary | ICD-10-CM | POA: Diagnosis not present

## 2019-07-09 DIAGNOSIS — U071 COVID-19: Secondary | ICD-10-CM | POA: Diagnosis not present

## 2019-07-09 LAB — RENAL FUNCTION PANEL
Albumin: 2.1 g/dL — ABNORMAL LOW (ref 3.5–5.0)
Anion gap: 10 (ref 5–15)
BUN: 41 mg/dL — ABNORMAL HIGH (ref 8–23)
CO2: 19 mmol/L — ABNORMAL LOW (ref 22–32)
Calcium: 7.6 mg/dL — ABNORMAL LOW (ref 8.9–10.3)
Chloride: 107 mmol/L (ref 98–111)
Creatinine, Ser: 2.41 mg/dL — ABNORMAL HIGH (ref 0.44–1.00)
GFR calc Af Amer: 24 mL/min — ABNORMAL LOW (ref >60)
GFR calc non Af Amer: 21 mL/min — ABNORMAL LOW (ref >60)
Glucose, Bld: 130 mg/dL — ABNORMAL HIGH (ref 70–99)
Phosphorus: 6.1 mg/dL — ABNORMAL HIGH (ref 2.5–4.6)
Potassium: 3.3 mmol/L — ABNORMAL LOW (ref 3.5–5.1)
Sodium: 136 mmol/L (ref 135–145)

## 2019-07-09 LAB — GLUCOSE, CAPILLARY
Glucose-Capillary: 109 mg/dL — ABNORMAL HIGH (ref 70–99)
Glucose-Capillary: 114 mg/dL — ABNORMAL HIGH (ref 70–99)
Glucose-Capillary: 129 mg/dL — ABNORMAL HIGH (ref 70–99)
Glucose-Capillary: 136 mg/dL — ABNORMAL HIGH (ref 70–99)
Glucose-Capillary: 99 mg/dL (ref 70–99)

## 2019-07-09 LAB — CBC WITH DIFFERENTIAL/PLATELET
Abs Immature Granulocytes: 0.29 10*3/uL — ABNORMAL HIGH (ref 0.00–0.07)
Basophils Absolute: 0.1 10*3/uL (ref 0.0–0.1)
Basophils Relative: 1 %
Eosinophils Absolute: 0.2 10*3/uL (ref 0.0–0.5)
Eosinophils Relative: 1 %
HCT: 24.1 % — ABNORMAL LOW (ref 36.0–46.0)
Hemoglobin: 7.5 g/dL — ABNORMAL LOW (ref 12.0–15.0)
Immature Granulocytes: 2 %
Lymphocytes Relative: 18 %
Lymphs Abs: 2.9 10*3/uL (ref 0.7–4.0)
MCH: 30.2 pg (ref 26.0–34.0)
MCHC: 31.1 g/dL (ref 30.0–36.0)
MCV: 97.2 fL (ref 80.0–100.0)
Monocytes Absolute: 1.2 10*3/uL — ABNORMAL HIGH (ref 0.1–1.0)
Monocytes Relative: 8 %
Neutro Abs: 11.1 10*3/uL — ABNORMAL HIGH (ref 1.7–7.7)
Neutrophils Relative %: 70 %
Platelets: 351 10*3/uL (ref 150–400)
RBC: 2.48 MIL/uL — ABNORMAL LOW (ref 3.87–5.11)
RDW: 14.8 % (ref 11.5–15.5)
WBC: 15.7 10*3/uL — ABNORMAL HIGH (ref 4.0–10.5)
nRBC: 0 % (ref 0.0–0.2)

## 2019-07-09 LAB — CALCIUM, IONIZED: Calcium, Ionized, Serum: 4.5 mg/dL (ref 4.5–5.6)

## 2019-07-09 LAB — APTT: aPTT: 35 seconds (ref 24–36)

## 2019-07-09 LAB — C-REACTIVE PROTEIN: CRP: 6.6 mg/dL — ABNORMAL HIGH (ref <1.0)

## 2019-07-09 MED ORDER — MIDODRINE HCL 5 MG PO TABS
10.0000 mg | ORAL_TABLET | Freq: Three times a day (TID) | ORAL | Status: DC
  Filled 2019-07-09: qty 2

## 2019-07-09 MED ORDER — PRO-STAT SUGAR FREE PO LIQD
30.0000 mL | Freq: Two times a day (BID) | ORAL | Status: DC
  Administered 2019-07-09 – 2019-07-11 (×4): 30 mL via ORAL
  Filled 2019-07-09 (×4): qty 30

## 2019-07-09 MED ORDER — MELATONIN 3 MG PO TABS: 3.0000 mg | ORAL_TABLET | Freq: Every day | ORAL | Status: DC

## 2019-07-09 MED ORDER — ACETAMINOPHEN 160 MG/5ML PO SOLN: 650.0000 mg | Freq: Four times a day (QID) | ORAL | Status: DC | PRN

## 2019-07-09 MED ORDER — SODIUM CHLORIDE 0.9 % IV SOLN
250.0000 mL | INTRAVENOUS | Status: DC
  Administered 2019-07-09: 250 mL via INTRAVENOUS

## 2019-07-09 MED ORDER — PANTOPRAZOLE SODIUM 40 MG PO PACK
40.0000 mg | PACK | Freq: Every day | ORAL | Status: DC
  Administered 2019-07-09 – 2019-07-10 (×2): 40 mg via ORAL
  Filled 2019-07-09 (×2): qty 20

## 2019-07-09 MED ORDER — LEVETIRACETAM 100 MG/ML PO SOLN
500.0000 mg | Freq: Two times a day (BID) | ORAL | Status: DC
  Administered 2019-07-09 – 2019-07-11 (×4): 500 mg via ORAL
  Filled 2019-07-09 (×5): qty 5

## 2019-07-09 MED ORDER — ADULT MULTIVITAMIN LIQUID CH
15.0000 mL | Freq: Every day | ORAL | Status: DC
  Administered 2019-07-09: 15 mL via ORAL
  Filled 2019-07-09: qty 15

## 2019-07-09 MED ORDER — HYDROCODONE-ACETAMINOPHEN 5-325 MG PO TABS
1.0000 | ORAL_TABLET | Freq: Four times a day (QID) | ORAL | Status: DC | PRN
  Administered 2019-07-09 – 2019-07-11 (×2): 1 via ORAL
  Filled 2019-07-09 (×2): qty 1

## 2019-07-09 MED ORDER — ADULT MULTIVITAMIN LIQUID CH
15.0000 mL | Freq: Every day | ORAL | Status: DC
  Administered 2019-07-10 – 2019-07-11 (×2): 15 mL via ORAL
  Filled 2019-07-09 (×2): qty 15

## 2019-07-09 MED ORDER — MELATONIN 3 MG PO TABS
3.0000 mg | ORAL_TABLET | Freq: Every day | ORAL | Status: DC
  Administered 2019-07-09 – 2019-07-10 (×2): 3 mg via ORAL
  Filled 2019-07-09 (×2): qty 1

## 2019-07-09 MED ORDER — FELBAMATE 600 MG/5ML PO SUSP
300.0000 mg | Freq: Two times a day (BID) | ORAL | Status: DC
  Administered 2019-07-09 – 2019-07-11 (×4): 300 mg via ORAL
  Filled 2019-07-09 (×5): qty 2.5

## 2019-07-09 MED ORDER — ADULT MULTIVITAMIN LIQUID CH: 15.0000 mL | Freq: Every day | ORAL | Status: DC

## 2019-07-09 MED ORDER — TOPIRAMATE 100 MG PO TABS
200.0000 mg | ORAL_TABLET | Freq: Two times a day (BID) | ORAL | Status: DC
  Administered 2019-07-09 – 2019-07-11 (×4): 200 mg via ORAL
  Filled 2019-07-09 (×4): qty 2

## 2019-07-09 MED ORDER — POTASSIUM CHLORIDE CRYS ER 20 MEQ PO TBCR
40.0000 meq | EXTENDED_RELEASE_TABLET | Freq: Once | ORAL | Status: AC
  Administered 2019-07-09: 40 meq via ORAL
  Filled 2019-07-09: qty 2

## 2019-07-09 MED ORDER — PRO-STAT SUGAR FREE PO LIQD: 30.0000 mL | Freq: Two times a day (BID) | ORAL | Status: DC

## 2019-07-09 MED ORDER — MIDODRINE HCL 5 MG PO TABS
10.0000 mg | ORAL_TABLET | Freq: Three times a day (TID) | ORAL | Status: DC
  Administered 2019-07-09 – 2019-07-10 (×4): 10 mg via ORAL
  Filled 2019-07-09 (×4): qty 2

## 2019-07-09 MED ORDER — GUAIFENESIN-CODEINE 100-10 MG/5ML PO SOLN: 10.0000 mL | ORAL | Status: DC | PRN

## 2019-07-09 NOTE — Progress Notes (Signed)
Patient ID: Amber Bowman, female   DOB: 17-Aug-1955, 64 y.o.   MRN: [id number] [facility] Progress Note   Assessment/ Plan:   1. Acute kidney Injury: likely ATN from sepsis/shock and a component of urinary obstruction- s/p left percutaneous nephrostomy tube placement on 4/22.  Excellent urine output and continued slow renal recovery noted on labs.  No indications for dialysis. 2. E. Coli bacteremia and sepsis- likely of urinary source and on ampicillin for specific coverage.  Leukocytosis improving. 3.  Hypotension: Off pressors and remains on midodrine 4. Left ureteral calculus with hydronephrosis: s/p PCN with decent drainage. 5. Seizure disorder: On antiepileptic medications, remains seizure-free. 6.  Hypokalemia: Likely secondary to urinary losses, status post total replacement earlier.  Subjective:   Doing well for the most part with some episodes of agitation overnight.  Objective:   BP (!) 101/45   Pulse 84   Temp 98.7 F (37.1 C) (Axillary)   Resp (!) 9   Ht 5\' 4"  (1.626 m)   Wt 74 kg   LMP 02/11/2008   SpO2 98%   BMI 28.00 kg/m   Intake/Output Summary (Last 24 hours) at 07/09/2019 1110 Last data filed at 07/09/2019 0900 Gross per 24 hour  Intake 982.63 ml  Output 1930 ml  Net -947.37 ml   Weight change: -0.4 kg  Physical Exam: Gen: Resting comfortably in bed, oriented. [id number] regular rhythm, normal rate, S1 and S2 normal  Resp:Clear to auscultation, no rales/rhonchi [id number], obese, with left sided nephrostomy tube in place Ext: No lower extremity edema with ischemic toe tips-2 on the left side and 1 on the right side  Imaging: No results found.  Labs: BMET Recent Labs  Lab 07/05/19 1600 07/06/19 0314 07/06/19 1700 07/07/19 0300 07/07/19 1718 07/08/19 0217 07/09/19 0315  NA 137 135  136 135 137  136 135 136 136  K 4.4 4.3  4.3 4.1 3.9  3.9 3.8 3.6 3.3*  CL 103 102  102 103 104  103 104 104 107  CO2 24 24  25 24 23  23  21*  21* 19*  GLUCOSE 147* 131*  125* 129* 123*  127* 109* 104* 130*  BUN 30* 42*  42* 52* 57*  57* 53* 50* 41*  CREATININE 1.45* 2.17*  2.18* 2.49* 2.59*  2.53* 2.68* 2.59* 2.41*  CALCIUM 7.8* 7.6*  7.5* 7.5* 7.2*  7.4* 7.6* 7.5* 7.6*  PHOS 3.1 4.4 6.1* 6.1* 6.1* 5.6* 6.1*   CBC Recent Labs  Lab 07/06/19 0314 07/07/19 0300 07/08/19 0217 07/09/19 0315  WBC 50.3* 29.4* 18.9* 15.7*  NEUTROABS 33.8* 19.9* 12.7* 11.1*  HGB 8.6* 7.5* 7.6* 7.5*  HCT 26.6* 23.6* 24.5* 24.1*  MCV 96.7 96.7 97.2 97.2  PLT 169 221 305 351    Medications:    . Chlorhexidine Gluconate Cloth  6 each Topical Daily  . feeding supplement (PRO-STAT SUGAR FREE 64)  30 mL Oral BID  . felbamate  300 mg Oral Q12H  . heparin injection (subcutaneous)  5,000 Units Subcutaneous Q8H  . insulin aspart  0-9 Units Subcutaneous Q4H  . levETIRAcetam  500 mg Oral BID  . mouth rinse  15 mL Mouth Rinse BID  . melatonin  3 mg Oral QHS  . midodrine  10 mg Oral TID WC  . [START ON 07/10/2019] multivitamin  15 mL Oral Daily  . pantoprazole sodium  40 mg Oral QHS  . sodium chloride flush  10-40 mL Intracatheter Q12H  . topiramate  200 mg Oral  BID   Elmarie Shiley, MD 07/09/2019, 11:10 AM

## 2019-07-09 NOTE — Progress Notes (Addendum)
PROGRESS NOTE  Amber Bowman ZLD:[id number] DOB: [birth year] DOA: 06/27/2019 PCP: Huel Cote, NP  HPI/Recap of past 73 hours: 64 year old female with past medical history of seizures, depression admitted on 4/20 with septic shock secondary to pyelonephritis.  Patient was admitted to the critical care service.  Despite aggressive fluid resuscitation, pressor support and IV antibiotics, patient's creatinine continued to decline.  Nephrology was consulted.  Patient had a nephrostomy tube placed by interventional radiology as well as an hemodialysis catheter and on 4/22, CRRT was started.  By 4/23, patient able to be weaned off of pressors and by 4/30, able to make much more urine on her own and patient transferred out of ICU to the hospitalist service.  On night of 4/29, patient confused and agitated, accidentally pulled out her hemodialysis catheter.  No active bleeding.  Patient continues to slowly progress.  Remains alert and tolerating clear liquids.  Her only complaint is of  mucosal oral ulcerations, felt to be secondary to Covid.  Has been off of CRRT now for 3 days and renal function continues to improve with good urine output.    At times, nursing notes that patient will act out with bizarre behavior such as moaning or screaming and then a few minutes later, acts perfectly calm as if nothing is wrong.  Nurses discussed this with the patient's husband who confided that there is suspicion that the patient has an underlying undiagnosed mental health disorder.  Assessment/Plan: Principal Problem:   Septic shock due to Escherichia coli (HCC) from pyelonephritis: Patient met criteria for septic shock on admission given tachycardia, pyelonephritis source, lactic acidosis and persistent hypotension despite IV fluids requiring pressor support causing acute kidney injury.  Blood cultures grew out E. coli.  Patient continued on antibiotics.  Sepsis felt to be resolved.  Renal function not yet  normalized although her creatinine continues to improve and she has been now off of dialysis since 4/29.  Nephrology following. Active Problems:   Epilepsy [facility]): On Keppra and Topamax  Mood disorder, unspecified: As above.  Patient will act out with bizarre behavior and then, immediately after act perfectly, as if nothing is wrong.  She herself told a nurse on the night of 4/30 that she did this to get the nurses attention, because she felt like she was not getting responses soon enough.  Nursing has discussed these episodes with her husband who confided in them that this is not new and he suspects that she has an undiagnosed mental health disorder.  If this continues to worsen, would consider psychiatry consult and perhaps starting patient on SSRI.    Overweight (BMI 25.0-29.9): Patient meets criteria BMI greater than 25.    COVID-19 virus infection: Incidentally diagnosed on admission.  Patient initially received IV remdesivir and dexamethasone.  She is considered contagious until 5/11.  No evidence of any respiratory compromise.  Oral ulcers felt to be likely related.    Acute metabolic encephalopathy: Not really encephalopathy, may be more of a mood disorder as specified above.  Chronic systolic heart failure: Noted on echocardiogram.  As she is auto diuresing, this should help her volumes.  Will check a BNP for the morning to see where things are at.    Leukocytosis: Secondary to infection plus steroids.  Resolving.    Thrombocytopenia ([place]): Secondary to sepsis.  Platelet count now normalized.  Lowest point was at 29.   Code Status: Full code  Family Communication: Left message for husband  Disposition Plan: Continues to  improve.  Currently telemetry patient, waiting for floor bed.  Seen by PT who are recommending home health.  OT consulted as well.  We will try to spend more time up in chair.  We will try to advance her diet.   Consultants:  Urology  Critical care  Nephrology   Hematology  Procedures:  Placement of HD catheter  Placement of percutaneous nephrostomy tube  Central line placement  CRRT-resolved.  Antimicrobials:  IV Rocephin 4/20-4/26  IV ampicillin 4/26-present  IV remdesivir 4/20-4/24  DVT prophylaxis: Subcu heparin   Objective: Vitals:   07/09/19 1100 07/09/19 1114  BP: (!) 106/45 103/84  Pulse: 71 91  Resp: 17 20  Temp:    SpO2: 97% 97%    Intake/Output Summary (Last 24 hours) at 07/09/2019 1125 Last data filed at 07/09/2019 0900 Gross per 24 hour  Intake 982.63 ml  Output 1930 ml  Net -947.37 ml   Filed Weights   07/07/19 0448 07/08/19 0354 07/09/19 0500  Weight: 74.5 kg 74.4 kg 74 kg   Body mass index is 28 kg/m.  Exam:   General: Resting comfortably  HEENT normocephalic, atraumatic, noted oral ulcerations, mucous membranes slightly dry  Neck: Supple, no JVD  Cardiovascular: Regular rate and rhythm, S1-S2  Respiratory: Clear to auscultation bilaterally  Abdomen: Soft, nontender, nondistended, positive bowel sounds  Musculoskeletal: No clubbing or cyanosis, 1+ pitting edema from the knees down bilaterally  Skin: No skin breaks tears or lesions other than oral mucosal lesions  Psychiatry: Appropriate, no evidence of psychoses (although episodes have occurred as documented)  Neuro: No focal deficits   Data Reviewed: CBC: Recent Labs  Lab 07/05/19 0425 07/06/19 0314 07/07/19 0300 07/08/19 0217 07/09/19 0315  WBC 74.1* 50.3* 29.4* 18.9* 15.7*  NEUTROABS 54.8* 33.8* 19.9* 12.7* 11.1*  HGB 9.4* 8.6* 7.5* 7.6* 7.5*  HCT 28.7* 26.6* 23.6* 24.5* 24.1*  MCV 95.0 96.7 96.7 97.2 97.2  PLT 116* 169 221 305 161   Basic Metabolic Panel: Recent Labs  Lab 07/06/19 1700 07/07/19 0300 07/07/19 1718 07/08/19 0217 07/08/19 1358 07/09/19 0315  NA 135 137  136 135 136  --  136  K 4.1 3.9  3.9 3.8 3.6  --  3.3*  CL 103 104  103 104 104  --  107  CO2 _0 21* 21*  --  19*  GLUCOSE 129*  123*  127* 109* 104*  --  130*  BUN 52* 57*  57* 53* 50*  --  41*  CREATININE 2.49* 2.59*  2.53* 2.68* 2.59*  --  2.41*  CALCIUM 7.5* 7.2*  7.4* 7.6* 7.5*  --  7.6*  MG  --   --   --   --  2.3  --   PHOS 6.1* 6.1* 6.1* 5.6*  --  6.1*   GFR: Estimated Creatinine Clearance: 23.2 mL/min (A) (by C-G formula based on SCr of 2.41 mg/dL (H)). Liver Function Tests: Recent Labs  Lab 07/03/19 0441 07/03/19 1600 07/05/19 0425 07/05/19 1600 07/06/19 0314 07/06/19 0314 07/06/19 1700 07/07/19 0300 07/07/19 1718 07/08/19 0217 07/09/19 0315  AST 25  --  21  --  19  --   --   --   --   --   --   ALT 34  --  27  --  23  --   --   --   --   --   --   ALKPHOS 240*  --  219*  --  189*  --   --   --   --   --   --  BILITOT 0.7  --  0.6  --  0.7  --   --   --   --   --   --   PROT 6.1*  --  5.9*  --  5.8*  --   --   --   --   --   --   ALBUMIN 2.4*  2.3*   < > 2.2*   < > 2.0*  2.0*   < > 2.0* 2.0* 2.2* 2.0* 2.1*   < > = values in this interval not displayed.   No results for input(s): LIPASE, AMYLASE in the last 168 hours. No results for input(s): AMMONIA in the last 168 hours. Coagulation Profile: No results for input(s): INR, PROTIME in the last 168 hours. Cardiac Enzymes: No results for input(s): CKTOTAL, CKMB, CKMBINDEX, TROPONINI in the last 168 hours. BNP (last 3 results) No results for input(s): PROBNP in the last 8760 hours. HbA1C: No results for input(s): HGBA1C in the last 72 hours. CBG: Recent Labs  Lab 07/08/19 1928 07/09/19 0020 07/09/19 0336 07/09/19 0814 07/09/19 1111  GLUCAP 95 109* 114* 99 129*   Lipid Profile: No results for input(s): CHOL, HDL, LDLCALC, TRIG, CHOLHDL, LDLDIRECT in the last 72 hours. Thyroid Function Tests: No results for input(s): TSH, T4TOTAL, FREET4, T3FREE, THYROIDAB in the last 72 hours. Anemia Panel: No results for input(s): VITAMINB12, FOLATE, FERRITIN, TIBC, IRON, RETICCTPCT in the last 72 hours. Urine analysis:    Component Value  Date/Time   COLORURINE YELLOW 06/28/2019 1418   APPEARANCEUR CLOUDY (A) 06/28/2019 1418   LABSPEC 1.025 06/28/2019 1418   PHURINE 5.0 06/28/2019 1418   GLUCOSEU NEGATIVE 06/28/2019 1418   HGBUR LARGE (A) 06/28/2019 1418   BILIRUBINUR MODERATE (A) 06/28/2019 1418   KETONESUR 5 (A) 06/28/2019 1418   PROTEINUR 100 (A) 06/28/2019 1418   UROBILINOGEN 0.2 02/12/2012 0842   NITRITE POSITIVE (A) 06/28/2019 1418   LEUKOCYTESUR MODERATE (A) 06/28/2019 1418   Sepsis Labs: _0 (procalcitonin:4,lacticidven:4)  ) Recent Results (from the past 240 hour(s))  Culture, blood (routine x 2)     Status: None   Collection Time: 07/02/19 11:32 AM   Specimen: BLOOD  Result Value Ref Range Status   Specimen Description   Final    BLOOD BLOOD LEFT HAND Performed at [facility], [place] [address] [place], [place] [address]    Special Requests   Final    BOTTLES DRAWN AEROBIC ONLY Blood Culture adequate volume Performed at [place] [address] [place], [place] [address]    Culture   Final    NO GROWTH 5 DAYS Performed at [facility], [place] [address] [place], [place] [address]    Report Status 07/07/2019 FINAL  Final  Culture, blood (routine x 2)     Status: None   Collection Time: 07/02/19 11:32 AM   Specimen: BLOOD  Result Value Ref Range Status   Specimen Description   Final    BLOOD BLOOD LEFT HAND Performed at [facility] [address] [place], [place] [address]    Special Requests   Final    BOTTLES DRAWN AEROBIC ONLY Blood Culture adequate volume Performed at [facility] [address], [place], [place] [address]    Culture   Final    NO GROWTH 5 DAYS Performed at [facility], [place] [address] [place], [place] [address]    Report Status 07/07/2019 FINAL  Final  C Difficile Quick Screen (  NO PCR Reflex)     Status: None   Collection Time: 07/02/19 12:28 PM   Specimen: STOOL   Result Value Ref Range Status   C Diff antigen NEGATIVE NEGATIVE Final   C Diff toxin NEGATIVE NEGATIVE Final   C Diff interpretation No C. difficile detected.  Final    Comment: Performed at [facility], [place] [address], [place], [name] [address]      Studies: No results found.  Scheduled Meds: . Chlorhexidine Gluconate Cloth  6 each Topical Daily  . feeding supplement (PRO-STAT SUGAR FREE 64)  30 mL Oral BID  . felbamate  300 mg Oral Q12H  . heparin injection (subcutaneous)  5,000 Units Subcutaneous Q8H  . insulin aspart  0-9 Units Subcutaneous Q4H  . levETIRAcetam  500 mg Oral BID  . mouth rinse  15 mL Mouth Rinse BID  . melatonin  3 mg Oral QHS  . midodrine  10 mg Oral TID WC  . [START ON 07/10/2019] multivitamin  15 mL Oral Daily  . pantoprazole sodium  40 mg Oral QHS  . sodium chloride flush  10-40 mL Intracatheter Q12H  . topiramate  200 mg Oral BID    Continuous Infusions: . sodium chloride 250 mL (07/09/19 1104)  . ampicillin (OMNIPEN) IV Stopped (07/09/19 0544)  . dextrose 20 mL/hr at 07/09/19 0640  . phenylephrine (NEO-SYNEPHRINE) Adult infusion 40 mcg/min (07/04/19 1900)     LOS: 12 days     Annita Brod, MD [facility]   07/09/2019, 11:25 AM

## 2019-07-09 NOTE — Progress Notes (Signed)
AM labs showed K 3.3;DrMaryland Pink notified by this RN; 40 mEq ordered PO. This RN will continue to monitor pt.

## 2019-07-10 DIAGNOSIS — R6521 Severe sepsis with septic shock: Secondary | ICD-10-CM | POA: Diagnosis not present

## 2019-07-10 DIAGNOSIS — A4151 Sepsis due to Escherichia coli [E. coli]: Secondary | ICD-10-CM | POA: Diagnosis not present

## 2019-07-10 LAB — IRON AND TIBC
Iron: 87 ug/dL (ref 28–170)
Saturation Ratios: 29 % (ref 10.4–31.8)
TIBC: 295 ug/dL (ref 250–450)
UIBC: 208 ug/dL

## 2019-07-10 LAB — GLUCOSE, CAPILLARY
Glucose-Capillary: 105 mg/dL — ABNORMAL HIGH (ref 70–99)
Glucose-Capillary: 111 mg/dL — ABNORMAL HIGH (ref 70–99)
Glucose-Capillary: 130 mg/dL — ABNORMAL HIGH (ref 70–99)
Glucose-Capillary: 88 mg/dL (ref 70–99)
Glucose-Capillary: 97 mg/dL (ref 70–99)
Glucose-Capillary: 97 mg/dL (ref 70–99)
Glucose-Capillary: 98 mg/dL (ref 70–99)

## 2019-07-10 LAB — CBC WITH DIFFERENTIAL/PLATELET
Abs Immature Granulocytes: 0.11 10*3/uL — ABNORMAL HIGH (ref 0.00–0.07)
Basophils Absolute: 0.1 10*3/uL (ref 0.0–0.1)
Basophils Relative: 1 %
Eosinophils Absolute: 0.2 10*3/uL (ref 0.0–0.5)
Eosinophils Relative: 2 %
HCT: 22.4 % — ABNORMAL LOW (ref 36.0–46.0)
Hemoglobin: 7 g/dL — ABNORMAL LOW (ref 12.0–15.0)
Immature Granulocytes: 1 %
Lymphocytes Relative: 30 %
Lymphs Abs: 3.6 10*3/uL (ref 0.7–4.0)
MCH: 31.3 pg (ref 26.0–34.0)
MCHC: 31.3 g/dL (ref 30.0–36.0)
MCV: 100 fL (ref 80.0–100.0)
Monocytes Absolute: 1.2 10*3/uL — ABNORMAL HIGH (ref 0.1–1.0)
Monocytes Relative: 10 %
Neutro Abs: 6.9 10*3/uL (ref 1.7–7.7)
Neutrophils Relative %: 56 %
Platelets: 463 10*3/uL — ABNORMAL HIGH (ref 150–400)
RBC: 2.24 MIL/uL — ABNORMAL LOW (ref 3.87–5.11)
RDW: 14.9 % (ref 11.5–15.5)
WBC: 12 10*3/uL — ABNORMAL HIGH (ref 4.0–10.5)
nRBC: 0 % (ref 0.0–0.2)

## 2019-07-10 LAB — RENAL FUNCTION PANEL
Albumin: 2 g/dL — ABNORMAL LOW (ref 3.5–5.0)
Anion gap: 9 (ref 5–15)
BUN: 33 mg/dL — ABNORMAL HIGH (ref 8–23)
CO2: 18 mmol/L — ABNORMAL LOW (ref 22–32)
Calcium: 8.1 mg/dL — ABNORMAL LOW (ref 8.9–10.3)
Chloride: 112 mmol/L — ABNORMAL HIGH (ref 98–111)
Creatinine, Ser: 2.17 mg/dL — ABNORMAL HIGH (ref 0.44–1.00)
GFR calc Af Amer: 27 mL/min — ABNORMAL LOW (ref >60)
GFR calc non Af Amer: 23 mL/min — ABNORMAL LOW (ref >60)
Glucose, Bld: 93 mg/dL (ref 70–99)
Phosphorus: 5.3 mg/dL — ABNORMAL HIGH (ref 2.5–4.6)
Potassium: 3.4 mmol/L — ABNORMAL LOW (ref 3.5–5.1)
Sodium: 139 mmol/L (ref 135–145)

## 2019-07-10 LAB — FERRITIN: Ferritin: 178 ng/mL (ref 11–307)

## 2019-07-10 LAB — PREPARE RBC (CROSSMATCH)

## 2019-07-10 LAB — APTT: aPTT: 31 seconds (ref 24–36)

## 2019-07-10 LAB — BRAIN NATRIURETIC PEPTIDE: B Natriuretic Peptide: 338.5 pg/mL — ABNORMAL HIGH (ref 0.0–100.0)

## 2019-07-10 MED ORDER — SODIUM CHLORIDE 0.9% IV SOLUTION: Freq: Once | INTRAVENOUS | Status: AC

## 2019-07-10 MED ORDER — POTASSIUM CHLORIDE CRYS ER 20 MEQ PO TBCR
40.0000 meq | EXTENDED_RELEASE_TABLET | Freq: Once | ORAL | Status: AC
  Administered 2019-07-10: 40 meq via ORAL
  Filled 2019-07-10: qty 2

## 2019-07-10 NOTE — Plan of Care (Signed)
  Problem: Education: Goal: Knowledge of risk factors and measures for prevention of condition will improve Outcome: Progressing   Problem: Coping: Goal: Psychosocial and spiritual needs will be supported Outcome: Progressing   Problem: Respiratory: Goal: Will maintain a patent airway Outcome: Progressing Goal: Complications related to the disease process, condition or treatment will be avoided or minimized Outcome: Progressing   Problem: Education: Goal: Knowledge of General Education information will improve Description: Including pain rating scale, medication(s)/side effects and non-pharmacologic comfort measures Outcome: Progressing   Problem: Health Behavior/Discharge Planning: Goal: Ability to manage health-related needs will improve Outcome: Progressing   Problem: Clinical Measurements: Goal: Ability to maintain clinical measurements within normal limits will improve Outcome: Progressing Goal: Will remain free from infection Outcome: Progressing Goal: Diagnostic test results will improve Outcome: Progressing Goal: Respiratory complications will improve Outcome: Progressing Goal: Cardiovascular complication will be avoided Outcome: Progressing   Problem: Activity: Goal: Risk for activity intolerance will decrease Outcome: Progressing   Problem: Nutrition: Goal: Adequate nutrition will be maintained Outcome: Progressing   Problem: Coping: Goal: Level of anxiety will decrease Outcome: Progressing   Problem: Elimination: Goal: Will not experience complications related to bowel motility Outcome: Progressing Goal: Will not experience complications related to urinary retention Outcome: Progressing   Problem: Pain Managment: Goal: General experience of comfort will improve Outcome: Progressing   Problem: Safety: Goal: Ability to remain free from injury will improve Outcome: Progressing   Problem: Skin Integrity: Goal: Risk for impaired skin integrity will  decrease Outcome: Progressing   Problem: Education: Goal: Knowledge of disease and its progression will improve Outcome: Progressing   Problem: Health Behavior/Discharge Planning: Goal: Ability to manage health-related needs will improve Outcome: Progressing   Problem: Clinical Measurements: Goal: Complications related to the disease process or treatment will be avoided or minimized Outcome: Progressing Goal: Dialysis access will remain free of complications Outcome: Progressing   Problem: Activity: Goal: Activity intolerance will improve Outcome: Progressing   Problem: Fluid Volume: Goal: Fluid volume balance will be maintained or improved Outcome: Progressing   Problem: Nutritional: Goal: Ability to make appropriate dietary choices will improve Outcome: Progressing   Problem: Respiratory: Goal: Respiratory symptoms related to disease process will be avoided Outcome: Progressing   Problem: Self-Concept: Goal: Body image disturbance will be avoided or minimized Outcome: Progressing   Problem: Urinary Elimination: Goal: Progression of disease will be identified and treated Outcome: Progressing

## 2019-07-10 NOTE — Progress Notes (Signed)
Physical Therapy Treatment Patient Details Name: Amber Bowman MRN: [id number] DOB: 1955/07/08 Today's Date: 07/10/2019    History of Present Illness Pt admitted with severe sepsis from nephrolithiasis/Pyelo s/p L perc neph c/b ecoli bactermia and ATN requirring CRRT - CRRT now dc.  Pt with hx of Epilepsy, and Osteopenia    PT Comments    Pt feeling much better.  General Gait Details: started with the walker but appeared to be more of a hazzard so amb without any AD.  Pt has a good alternating gait but with high speed (I love to walk, I walk 3 miles a day).  Instructed to decrease gait speed as her RR increased to 38 and HR 135.  Instructed to have her "slow her pace" and alow a gradual increae in activity post COVID. Pt tolerated 2 laps around the unit.  Avg RA was 94% and HR 88.   Follow Up Recommendations  Home health PT     Equipment Recommendations  None recommended by PT    Recommendations for Other Services       Precautions / Restrictions Precautions Precautions: Fall Precaution Comments: left Nephrostomy Drain Restrictions Weight Bearing Restrictions: No    Mobility  Bed Mobility               General bed mobility comments: OOB in recliner  Transfers Overall transfer level: Needs assistance Equipment used: Rolling walker (2 wheeled);None Transfers: Sit to/from Stand Sit to Stand: Supervision         General transfer comment: VC's with safety of lines/leads and Neph Drain (pt impulsive)  Ambulation/Gait Ambulation/Gait assistance: Supervision Gait Distance (Feet): 750 Feet Assistive device: None Gait Pattern/deviations: Step-through pattern     General Gait Details: started with the walker but appeared to be more of a hazzard so amb without any AD.  Pt has a good alternating gait but with high speed (I love to walk, I walk 3 miles a day).  Instructed to decrease gait speed as her RR increased to 38 and HR 135.  Instructed to have her "slow her pace"  and alow a gradual increae in activity post COVID.   Stairs             Wheelchair Mobility    Modified Rankin (Stroke Patients Only)       Balance                                            Cognition Arousal/Alertness: Awake/alert Behavior During Therapy: WFL for tasks assessed/performed;Impulsive Overall Cognitive Status: Within Functional Limits for tasks assessed                                 General Comments: eager to go home      Exercises      General Comments        Pertinent Vitals/Pain Pain Assessment: No/denies pain    Home Living                      Prior Function            PT Goals (current goals can now be found in the care plan section) Progress towards PT goals: Progressing toward goals    Frequency    Min 3X/week      PT  Plan Current plan remains appropriate    Co-evaluation              AM-PAC PT "6 Clicks" Mobility   Outcome Measure  Help needed turning from your back to your side while in a flat bed without using bedrails?: None Help needed moving from lying on your back to sitting on the side of a flat bed without using bedrails?: None Help needed moving to and from a bed to a chair (including a wheelchair)?: None Help needed standing up from a chair using your arms (e.g., wheelchair or bedside chair)?: None Help needed to walk in hospital room?: None Help needed climbing 3-5 steps with a railing? : A Little 6 Click Score: 23    End of Session Equipment Utilized During Treatment: Gait belt Activity Tolerance: Patient tolerated treatment well Patient left: in chair;with call bell/phone within reach;with chair alarm set Nurse Communication: Mobility status PT Visit Diagnosis: Unsteadiness on feet (R26.81);Muscle weakness (generalized) (M62.81);Difficulty in walking, not elsewhere classified (R26.2)     Time: 1025-1050 PT Time Calculation (min) (ACUTE ONLY): 25  min  Charges:  $Gait Training: 8-22 mins $Therapeutic Activity: 8-22 mins                     Rica Koyanagi  PTA Acute  Rehabilitation Services Pager      [phone] Office      [phone]

## 2019-07-10 NOTE — Progress Notes (Signed)
PROGRESS NOTE    Amber Bowman  DHR:[id number] DOB: [birth year] DOA: 06/27/2019 PCP: Huel Cote, NP   Brief Narrative:  Patient is a 64 year old female with past medical history of seizures, depression admitted on 4/20 with septic shock secondary to pyelonephritis.  Patient was admitted to the critical care service. Despite aggressive fluid resuscitation, pressor support and IV antibiotics, patient's creatinine continued to decline.  Nephrology was consulted.  Patient had a nephrostomy tube placed by interventional radiology as well as an hemodialysis catheter and on 4/22, CRRT was started.  By 4/23, patient able to be weaned off of pressors and by 4/30, able to make much more urine on her own and patient transferred out of ICU to the hospitalist service.  On night of 4/29, patient confused and agitated, accidentally pulled out her hemodialysis catheter.  No active bleeding. Currently she has significantly improved, hemodynamically stable and maintaining her saturation on room air. Her only complaint is of  mucosal oral ulcerations, felt to be secondary to Covid.  Has been off of CRRT now for 4 days and renal function continues to improve with good urine output.  Nephrology he has signed off.   Hospital course also remarkable for bacteremia with E. Coli, on IV antibiotics.  PT/OT recommended home health. Her hemoglobin dropped to 7 so she is being transfused with a unit of PRBC.  Plan for discharge to home tomorrow with home health.  At times, nursing notes that patient will act out with bizarre behavior such as moaning or screaming and then a few minutes later, acts perfectly calm as if nothing is wrong.  Nurses discussed this with the patient's husband who confided that there is suspicion that the patient has an underlying undiagnosed mental health disorder. Assessment & Plan:   Principal Problem:   Septic shock due to Escherichia coli [facility]) Active Problems:   Epilepsy ([place])   Acute renal  failure (ARF) (HCC)   Overweight (BMI 25.0-29.9)   COVID-19 virus infection   Acute pyelonephritis   Unspecified mood (affective) disorder (HCC)   Leukocytosis   Thrombocytopenia (HCC)   Chronic systolic CHF (congestive heart failure) (HCC)  Septic shock due to Escherichia coli ([place]) from pyelonephritis: Patient met criteria for septic shock on admission given tachycardia, pyelonephritis source, lactic acidosis and persistent hypotension despite IV fluids requiring pressor support causing acute kidney injury.  Blood cultures grew out E. coli.  Patient continued on antibiotics.  Sepsis felt to be resolved.  Renal function not yet normalized although her creatinine continues to improve and she has been now off of dialysis since 4/29.  Nephrology was following,now have signed off. Will change antibiotics to oral on discharge.  Epilepsy : On Keppra and Topamax  Mood disorder, unspecified: She had bizzarre behavior during this hospitalization with intermittent moaning, screaming .Nursing has discussed these episodes with her husband who confided in them that this is not new and he suspects that she has an undiagnosed mental health disorder. She will  benefit with psychiatric consultation as an outpatient.  COVID-19 virus infection: Incidentally diagnosed on admission.  Patient initially received IV remdesivir and dexamethasone.  She is considered contagious until 5/11.  No evidence of any respiratory compromise.  Oral ulcers felt to be likely related.  Maintaining her saturation on room air   Acute metabolic encephalopathy: Not really encephalopathy, may be more of a mood disorder as specified above.  Currently alert and oriented  Chronic systolic heart failure: Noted on echocardiogram.  As she is auto  diuresing, this should help her volumes. Elevated BNP at 338. Will consider putting her on lasix on DC  Leukocytosis: Secondary to infection plus steroids.   Resolving.  Thrombocytopenia:Secondary to sepsis.  Now resolved.  Debility/deconditioning: PT recommended home health.  Hypokalemia: Supplemented with potassium today.  Normocytic anemia: Most likely history with her acute medical conditions, acute renal failure.  Iron studies showed normal iron.  No evidence of acute blood loss.We will transfuse her with a unit of PRBC.  Check CBC tomorrow  Dysphagia: Speech therapy evaluated her and recommended dysphagia 3 diet.  Nutrition Problem: Increased nutrient needs Etiology: acute illness, catabolic ZHGDJME(QASTM-19 infection; CRRT)      DVT prophylaxis:heparin [place] Code Status: full Family Communication: Discussed with the patient Status is: Inpatient  Remains inpatient appropriate because:Unsafe d/c plan   Dispo: The patient is from: Home              Anticipated d/c is to: Home              Anticipated d/c date is: 1 day              Patient currently is not medically stable to d/c.   Consultants: Nephrology, PCCM  Procedures: None  Antimicrobials:  Anti-infectives (From admission, onward)   Start     Dose/Rate Route Frequency Ordered Stop   07/06/19 1400  ampicillin (OMNIPEN) 2 g in sodium chloride 0.9 % 100 mL IVPB     2 g 300 mL/hr over 20 Minutes Intravenous Every 8 hours 07/06/19 1129     07/05/19 1800  ampicillin (OMNIPEN) 2 g in sodium chloride 0.9 % 100 mL IVPB  Status:  Discontinued     2 g 300 mL/hr over 20 Minutes Intravenous Every 6 hours 07/05/19 1507 07/06/19 1129   07/03/19 2200  ampicillin (OMNIPEN) 2 g in sodium chloride 0.9 % 100 mL IVPB  Status:  Discontinued     2 g 300 mL/hr over 20 Minutes Intravenous Every 8 hours 07/03/19 0856 07/05/19 1507   07/03/19 1000  ampicillin (OMNIPEN) 2 g in sodium chloride 0.9 % 100 mL IVPB  Status:  Discontinued     2 g 300 mL/hr over 20 Minutes Intravenous Every 8 hours 07/03/19 0844 07/03/19 0856   06/30/19 1000  remdesivir 100 mg in sodium chloride 0.9 % 100 mL IVPB      100 mg 200 mL/hr over 30 Minutes Intravenous Daily 06/30/19 0914 07/01/19 1037   06/28/19 1526  ceFAZolin (ANCEF) 2-4 GM/100ML-% IVPB    Note to Pharmacy: Hilma Favors   : cabinet override      06/28/19 1526 06/28/19 1525   06/28/19 1000  remdesivir 100 mg in sodium chloride 0.9 % 100 mL IVPB  Status:  Discontinued     100 mg 200 mL/hr over 30 Minutes Intravenous Daily 06/27/19 1814 06/27/19 1817   06/28/19 1000  remdesivir 100 mg in sodium chloride 0.9 % 100 mL IVPB  Status:  Discontinued     100 mg 200 mL/hr over 30 Minutes Intravenous Daily 06/27/19 1818 06/30/19 0914   06/28/19 0000  cefTRIAXone (ROCEPHIN) 2 g in sodium chloride 0.9 % 100 mL IVPB  Status:  Discontinued     2 g 200 mL/hr over 30 Minutes Intravenous Every 24 hours 06/27/19 1805 07/03/19 0844   06/27/19 1830  remdesivir 100 mg in sodium chloride 0.9 % 100 mL IVPB     100 mg 200 mL/hr over 30 Minutes Intravenous Every 1 hr x 2 06/27/19  1818 06/27/19 2029   06/27/19 1815  remdesivir 200 mg in sodium chloride 0.9% 250 mL IVPB  Status:  Discontinued     200 mg 580 mL/hr over 30 Minutes Intravenous Once 06/27/19 1814 06/27/19 1817   06/27/19 1400  cefTRIAXone (ROCEPHIN) 1 g in sodium chloride 0.9 % 100 mL IVPB  Status:  Discontinued     1 g 200 mL/hr over 30 Minutes Intravenous Every 24 hours 06/27/19 1359 06/27/19 1808      Subjective: Patient seen and examined at the bedside this morning.  Hemodynamically stable.  She walked with the physical therapy and did very good.  Currently on room air.  Denies any shortness of breath or chest pain.  Eager to go home.  Objective: Vitals:   07/10/19 0400 07/10/19 0440 07/10/19 0500 07/10/19 0542  BP:    (!) 113/55  Pulse: (!) 53  (!) 52 67  Resp: 11  14 16   Temp:    99.9 F (37.7 C)  TempSrc:    Axillary  SpO2: 97%  98%   Weight:  71.4 kg    Height:        Intake/Output Summary (Last 24 hours) at 07/10/2019 1100 Last data filed at 07/10/2019 0800 Gross per 24 hour   Intake 1866.15 ml  Output 1150 ml  Net 716.15 ml   Filed Weights   07/08/19 0354 07/09/19 0500 07/10/19 0440  Weight: 74.4 kg 74 kg 71.4 kg    Examination:  General exam: Appears calm and comfortable ,Not in distress,average built HEENT:PERRL,Oral mucosa moist, healing lesions around the lips Respiratory system: Bilateral equal air entry, normal vesicular breath sounds, no wheezes or crackles  Cardiovascular system: S1 & S2 heard, RRR. No JVD, murmurs, rubs, gallops or clicks. No pedal edema. Gastrointestinal system: Abdomen is nondistended, soft and nontender. No organomegaly or masses felt. Normal bowel sounds heard. Central nervous system: Alert and oriented. No focal neurological deficits. Extremities: No edema, no clubbing ,no cyanosis, distal peripheral pulses palpable. Skin: No rashes, lesions or ulcers,no icterus ,no pallor      Data Reviewed: I have personally reviewed following labs and imaging studies  CBC: Recent Labs  Lab 07/06/19 0314 07/07/19 0300 07/08/19 0217 07/09/19 0315 07/10/19 0519  WBC 50.3* 29.4* 18.9* 15.7* 12.0*  NEUTROABS 33.8* 19.9* 12.7* 11.1* 6.9  HGB 8.6* 7.5* 7.6* 7.5* 7.0*  HCT 26.6* 23.6* 24.5* 24.1* 22.4*  MCV 96.7 96.7 97.2 97.2 100.0  PLT 169 221 305 351 438*   Basic Metabolic Panel: Recent Labs  Lab 07/07/19 0300 07/07/19 1718 07/08/19 0217 07/08/19 1358 07/09/19 0315 07/10/19 0519  NA 137  136 135 136  --  136 139  K 3.9  3.9 3.8 3.6  --  3.3* 3.4*  CL 104  103 104 104  --  107 112*  CO2 23  23 21* 21*  --  19* 18*  GLUCOSE 123*  127* 109* 104*  --  130* 93  BUN 57*  57* 53* 50*  --  41* 33*  CREATININE 2.59*  2.53* 2.68* 2.59*  --  2.41* 2.17*  CALCIUM 7.2*  7.4* 7.6* 7.5*  --  7.6* 8.1*  MG  --   --   --  2.3  --   --   PHOS 6.1* 6.1* 5.6*  --  6.1* 5.3*   GFR: Estimated Creatinine Clearance: 25.4 mL/min (A) (by C-G formula based on SCr of 2.17 mg/dL (H)). Liver Function Tests: Recent Labs  Lab  07/05/19 0425 07/05/19  1600 07/06/19 0314 07/06/19 1700 07/07/19 0300 07/07/19 1718 07/08/19 0217 07/09/19 0315 07/10/19 0519  AST 21  --  19  --   --   --   --   --   --   ALT 27  --  23  --   --   --   --   --   --   ALKPHOS 219*  --  189*  --   --   --   --   --   --   BILITOT 0.6  --  0.7  --   --   --   --   --   --   PROT 5.9*  --  5.8*  --   --   --   --   --   --   ALBUMIN 2.2*   < > 2.0*  2.0*   < > 2.0* 2.2* 2.0* 2.1* 2.0*   < > = values in this interval not displayed.   No results for input(s): LIPASE, AMYLASE in the last 168 hours. No results for input(s): AMMONIA in the last 168 hours. Coagulation Profile: No results for input(s): INR, PROTIME in the last 168 hours. Cardiac Enzymes: No results for input(s): CKTOTAL, CKMB, CKMBINDEX, TROPONINI in the last 168 hours. BNP (last 3 results) No results for input(s): PROBNP in the last 8760 hours. HbA1C: No results for input(s): HGBA1C in the last 72 hours. CBG: Recent Labs  Lab 07/09/19 1551 07/09/19 1952 07/10/19 0003 07/10/19 0309 07/10/19 0816  GLUCAP 136* 130* 97 88 98   Lipid Profile: No results for input(s): CHOL, HDL, LDLCALC, TRIG, CHOLHDL, LDLDIRECT in the last 72 hours. Thyroid Function Tests: No results for input(s): TSH, T4TOTAL, FREET4, T3FREE, THYROIDAB in the last 72 hours. Anemia Panel: No results for input(s): VITAMINB12, FOLATE, FERRITIN, TIBC, IRON, RETICCTPCT in the last 72 hours. Sepsis Labs: No results for input(s): PROCALCITON, LATICACIDVEN in the last 168 hours.  Recent Results (from the past 240 hour(s))  Culture, blood (routine x 2)     Status: None   Collection Time: 07/02/19 11:32 AM   Specimen: BLOOD  Result Value Ref Range Status   Specimen Description   Final    BLOOD BLOOD LEFT HAND Performed at [facility] [address] [place], [place] [address]    Special Requests   Final    BOTTLES DRAWN AEROBIC ONLY Blood Culture adequate volume Performed at  [facility] [address], [place], [place] [address]    Culture   Final    NO GROWTH 5 DAYS Performed at [facility], [place] [address] [place], [place] [address]    Report Status 07/07/2019 FINAL  Final  Culture, blood (routine x 2)     Status: None   Collection Time: 07/02/19 11:32 AM   Specimen: BLOOD  Result Value Ref Range Status   Specimen Description   Final    BLOOD BLOOD LEFT HAND Performed at [facility] [address] [place], [place] [address]    Special Requests   Final    BOTTLES DRAWN AEROBIC ONLY Blood Culture adequate volume Performed at [facility] [address], [place],  [address]    Culture   Final    NO GROWTH 5 DAYS Performed at [facility], [place] [address], [place],  [address]    Report Status 07/07/2019 FINAL  Final  C Difficile Quick Screen (NO PCR Reflex)  Status: None   Collection Time: 07/02/19 12:28 PM   Specimen: STOOL  Result Value Ref Range Status   C Diff antigen NEGATIVE NEGATIVE Final   C Diff toxin NEGATIVE NEGATIVE Final   C Diff interpretation No C. difficile detected.  Final    Comment: Performed at [facility], [place] [address] [place], [place] [address]         Radiology Studies: No results found.      Scheduled Meds: . Chlorhexidine Gluconate Cloth  6 each Topical Daily  . feeding supplement (PRO-STAT SUGAR FREE 64)  30 mL Oral BID  . felbamate  300 mg Oral Q12H  . heparin injection (subcutaneous)  5,000 Units Subcutaneous Q8H  . insulin aspart  0-9 Units Subcutaneous Q4H  . levETIRAcetam  500 mg Oral BID  . mouth rinse  15 mL Mouth Rinse BID  . melatonin  3 mg Oral QHS  . midodrine  10 mg Oral TID WC  . multivitamin  15 mL Oral Daily  . pantoprazole sodium  40 mg Oral QHS  . sodium chloride flush  10-40 mL Intracatheter Q12H  . topiramate  200 mg Oral BID   Continuous Infusions: . sodium chloride  Stopped (07/09/19 1938)  . ampicillin (OMNIPEN) IV 2 g (07/10/19 0546)  . dextrose 20 mL/hr at 07/10/19 0500  . phenylephrine (NEO-SYNEPHRINE) Adult infusion 40 mcg/min (07/04/19 1900)     LOS: 13 days    Time spent: 25 mins.More than 50% of that time was spent in counseling and/or coordination of care.      Shelly Coss, MD [facility] P5/05/2019, 11:00 AM

## 2019-07-10 NOTE — TOC Progression Note (Signed)
Transition of Care [facility]) - Progression Note    Patient Details  Name: Amber Bowman MRN: [id number] Date of Birth: 05-20-1955  Transition of Care [facility]) CM/SW Contact  Purcell Mouton, RN Phone Number: 07/10/2019, 1:13 PM  Clinical Narrative:    Spoke with pt concerning Home Health. Pt will discharge with [facility] for HHPT/OT and [facility] for home O2.         Expected Discharge Plan and Services                                                 Social Determinants of Health (SDOH) Interventions    Readmission Risk Interventions No flowsheet data found.

## 2019-07-10 NOTE — Progress Notes (Addendum)
Received patient via wheelchair from icu, able to ambulate from chair to bed with steady gait, a&ox4, vss,  on room air, no resp distress noted, lungs clear to auscultation, nephrostomy tube to left flank area, dressing CDI,  scabbed sores noted to bottom lip, mouth moisturizer provided, oriented to room and call bell, placed on telemetry, will continue to monitor.

## 2019-07-10 NOTE — Progress Notes (Signed)
  Speech Language Pathology Treatment: Dysphagia  Patient Details Name: Amber Bowman MRN: [id number] DOB: 10/22/1955 Today's Date: 07/10/2019 Time: 1249-1300 SLP Time Calculation (min) (ACUTE ONLY): 11 min  Assessment / Plan / Recommendation Clinical Impression  Pt sitting upright in chair in room with her meal tray in front of her.  Her voice is not at baseline but is improving, per her statement.  Voice is marginally hoarse to this SLPs ear and suspect this is due to prolonged intubation.  SLP instructed pt to brush her teeth/gums/tongue am and pm for maximal oral health.  SLP brushed her dentures *located in denture cup* and had pt place them.  Mild stretching of lips noted with denture placement for which SlP provided her Vaseline to moisten for comfort.   Pt finishing her full liquid lunch tray upon SLP entrance to room.  Cough and belch noted x1 after liquid intake - with pt stating she is having reflux of acidic contents when questioned - She is on a PPI QD.  Pt able to consume graham crackers moistened with pudding - with adequate clearance.  Recommend advance to dys3/thin with general precautions.  Will follow up to initiate RMST and to assure pt tolerating po advancement.    HPI HPI: 64yo female admitted 06/27/19 with hypotension, n/v, poor po intake. Pt found to have gallstones, septic shock due to obstructive uropathy/pyelonephritis and COVD19. Pt developed AKI, resulting in CRRT. PMH: HTN, DM, heart disease, seizures. CXR = patchy ill-defined areas of airspace consolidation and interstitial prominence scattered throughout the lungs bilaterally  Pt transferred to the 5th floor yesterday and has been tolerating diet. She continues to report pain on her lips - but denies palatal/pharyngeal pain.      SLP Plan  Continue with current plan of care       Recommendations  Diet recommendations: Dysphagia 3 (mechanical soft);Thin liquid Liquids provided via: Cup;Straw Medication  Administration: Other (Comment)(as tolerated) Supervision: Patient able to self feed Compensations: Slow rate;Small sips/bites Postural Changes and/or Swallow Maneuvers: Seated upright 90 degrees;Upright 30-60 min after meal                Oral Care Recommendations: Oral care BID Follow up Recommendations: (tbd) SLP Visit Diagnosis: Dysphagia, unspecified (R13.10) Plan: Continue with current plan of care       GO               [name], MS [place]  Amber Bowman 07/10/2019, 2:37 PM

## 2019-07-10 NOTE — Progress Notes (Signed)
Patient sleeping currenty, mask still in place over mouth, no signs of any distress, will monitor.

## 2019-07-10 NOTE — Progress Notes (Addendum)
Patient ID: Amber Bowman, female   DOB: October 02, 1955, 64 y.o.   MRN: [id number] [facility] Progress Note   Assessment/ Plan:   1. Acute kidney Injury: likely ATN from sepsis/shock and a component of urinary obstruction- s/p left percutaneous nephrostomy tube placement on 4/22 and sp CRRT 4/22- 4/28.   Excellent urine output, pt euvolemic and continued renal recovery noted. No further suggestions, will sign off.  2. E. Coli bacteremia and sepsis- likely of urinary source and on ampicillin for specific coverage.  Leukocytosis improved. 3.  Hypotension: Off pressors and remains on midodrine 4. Left ureteral calculus with hydronephrosis: s/p PCN with decent drainage. Urology is following for elective OP stone Rx when stable.  5. Seizure disorder: On antiepileptic medications, remains seizure-free.  Kelly Splinter, MD 07/10/2019, 10:49 AM    Subjective:   Pt seen in room , no c/o's today, up in chair  Objective:   BP (!) 113/55 (BP Location: Right Arm)   Pulse 67   Temp 99.9 F (37.7 C) (Axillary)   Resp 16   Ht 5\' 4"  (1.626 m)   Wt 71.4 kg   LMP 02/11/2008   SpO2 98%   BMI 27.02 kg/m   Intake/Output Summary (Last 24 hours) at 07/10/2019 1045 Last data filed at 07/10/2019 0800 Gross per 24 hour  Intake 1866.15 ml  Output 1150 ml  Net 716.15 ml   Weight change: -2.6 kg  Physical Exam: Gen: Resting in chair, Ox 3 w/ prompting.  [id number] regular rhythm, normal rate, S1 and S2 normal  Resp:Clear to auscultation, no rales/rhonchi [id number], obese, with left sided nephrostomy tube in place Ext: No lower extremity edema  Imaging: No results found.  Labs: BMET Recent Labs  Lab 07/06/19 0314 07/06/19 1700 07/07/19 0300 07/07/19 1718 07/08/19 0217 07/09/19 0315 07/10/19 0519  NA 135  136 135 137  136 135 136 136 139  K 4.3  4.3 4.1 3.9  3.9 3.8 3.6 3.3* 3.4*  CL 102  102 103 104  103 104 104 107 112*  CO2 24  25 24 23  23  21* 21* 19* 18*  GLUCOSE 131*   125* 129* 123*  127* 109* 104* 130* 93  BUN 42*  42* 52* 57*  57* 53* 50* 41* 33*  CREATININE 2.17*  2.18* 2.49* 2.59*  2.53* 2.68* 2.59* 2.41* 2.17*  CALCIUM 7.6*  7.5* 7.5* 7.2*  7.4* 7.6* 7.5* 7.6* 8.1*  PHOS 4.4 6.1* 6.1* 6.1* 5.6* 6.1* 5.3*   CBC Recent Labs  Lab 07/07/19 0300 07/08/19 0217 07/09/19 0315 07/10/19 0519  WBC 29.4* 18.9* 15.7* 12.0*  NEUTROABS 19.9* 12.7* 11.1* 6.9  HGB 7.5* 7.6* 7.5* 7.0*  HCT 23.6* 24.5* 24.1* 22.4*  MCV 96.7 97.2 97.2 100.0  PLT 221 305 351 463*    Medications:    . Chlorhexidine Gluconate Cloth  6 each Topical Daily  . feeding supplement (PRO-STAT SUGAR FREE 64)  30 mL Oral BID  . felbamate  300 mg Oral Q12H  . heparin injection (subcutaneous)  5,000 Units Subcutaneous Q8H  . insulin aspart  0-9 Units Subcutaneous Q4H  . levETIRAcetam  500 mg Oral BID  . mouth rinse  15 mL Mouth Rinse BID  . melatonin  3 mg Oral QHS  . midodrine  10 mg Oral TID WC  . multivitamin  15 mL Oral Daily  . pantoprazole sodium  40 mg Oral QHS  . sodium chloride flush  10-40 mL Intracatheter Q12H  . topiramate  200  mg Oral BID

## 2019-07-10 NOTE — Progress Notes (Signed)
Patient woke and was screaming out, upon entering room patient was rubbing/picking at her lips and the scabs that were present previously were now gone and her lips were bleeding. Patient yelling "tell them to stop hurting!"  Cool compress applied to lips, encouraged patient not to lick lips or pick at ulcers on lips, medicated for pain per [facility] and magic swizzle was also applied to lips carefully with a mouth swab to try and provide some relief. After pain medication was given, patient appears more comfortable. Level 1 face mask applied over patient mouth to help discourage any picking or rubbing while she is asleep. Will continue to monitor.

## 2019-07-10 NOTE — Progress Notes (Signed)
OT order received.  Will perform OT eval next day Thanks, Kari Baars, OT Acute Rehabilitation Services [phone]

## 2019-07-11 DIAGNOSIS — R6521 Severe sepsis with septic shock: Secondary | ICD-10-CM | POA: Diagnosis not present

## 2019-07-11 DIAGNOSIS — A4151 Sepsis due to Escherichia coli [E. coli]: Secondary | ICD-10-CM | POA: Diagnosis not present

## 2019-07-11 LAB — CBC WITH DIFFERENTIAL/PLATELET
Abs Immature Granulocytes: 0.06 10*3/uL (ref 0.00–0.07)
Basophils Absolute: 0.1 10*3/uL (ref 0.0–0.1)
Basophils Relative: 1 %
Eosinophils Absolute: 0.1 10*3/uL (ref 0.0–0.5)
Eosinophils Relative: 1 %
HCT: 24.8 % — ABNORMAL LOW (ref 36.0–46.0)
Hemoglobin: 7.8 g/dL — ABNORMAL LOW (ref 12.0–15.0)
Immature Granulocytes: 1 %
Lymphocytes Relative: 21 %
Lymphs Abs: 2.1 10*3/uL (ref 0.7–4.0)
MCH: 30.4 pg (ref 26.0–34.0)
MCHC: 31.5 g/dL (ref 30.0–36.0)
MCV: 96.5 fL (ref 80.0–100.0)
Monocytes Absolute: 0.9 10*3/uL (ref 0.1–1.0)
Monocytes Relative: 8 %
Neutro Abs: 6.9 10*3/uL (ref 1.7–7.7)
Neutrophils Relative %: 68 %
Platelets: 452 10*3/uL — ABNORMAL HIGH (ref 150–400)
RBC: 2.57 MIL/uL — ABNORMAL LOW (ref 3.87–5.11)
RDW: 14.9 % (ref 11.5–15.5)
WBC: 10.1 10*3/uL (ref 4.0–10.5)
nRBC: 0 % (ref 0.0–0.2)

## 2019-07-11 LAB — GLUCOSE, CAPILLARY
Glucose-Capillary: 111 mg/dL — ABNORMAL HIGH (ref 70–99)
Glucose-Capillary: 85 mg/dL (ref 70–99)
Glucose-Capillary: 92 mg/dL (ref 70–99)
Glucose-Capillary: 99 mg/dL (ref 70–99)

## 2019-07-11 LAB — RENAL FUNCTION PANEL
Albumin: 2.3 g/dL — ABNORMAL LOW (ref 3.5–5.0)
Anion gap: 8 (ref 5–15)
BUN: 32 mg/dL — ABNORMAL HIGH (ref 8–23)
CO2: 18 mmol/L — ABNORMAL LOW (ref 22–32)
Calcium: 8.3 mg/dL — ABNORMAL LOW (ref 8.9–10.3)
Chloride: 112 mmol/L — ABNORMAL HIGH (ref 98–111)
Creatinine, Ser: 1.93 mg/dL — ABNORMAL HIGH (ref 0.44–1.00)
GFR calc Af Amer: 31 mL/min — ABNORMAL LOW (ref >60)
GFR calc non Af Amer: 27 mL/min — ABNORMAL LOW (ref >60)
Glucose, Bld: 103 mg/dL — ABNORMAL HIGH (ref 70–99)
Phosphorus: 5 mg/dL — ABNORMAL HIGH (ref 2.5–4.6)
Potassium: 3.7 mmol/L (ref 3.5–5.1)
Sodium: 138 mmol/L (ref 135–145)

## 2019-07-11 LAB — BPAM RBC
Blood Product Expiration Date: 202106052359
ISSUE DATE / TIME: 202105031625
Unit Type and Rh: 5100

## 2019-07-11 LAB — TYPE AND SCREEN
ABO/RH(D): O POS
Antibody Screen: NEGATIVE
Unit division: 0

## 2019-07-11 LAB — APTT: aPTT: 27 seconds (ref 24–36)

## 2019-07-11 MED ORDER — FELBAMATE 600 MG PO TABS: 300.0000 mg | ORAL_TABLET | Freq: Two times a day (BID) | ORAL | 1 refills | Status: DC

## 2019-07-11 MED ORDER — GUAIFENESIN-CODEINE 100-10 MG/5ML PO SOLN: 10.0000 mL | ORAL | 0 refills | Status: DC | PRN

## 2019-07-11 MED ORDER — MAGIC MOUTHWASH W/LIDOCAINE: 10.0000 mL | Freq: Three times a day (TID) | ORAL | 1 refills | Status: DC | PRN

## 2019-07-11 NOTE — Care Management Important Message (Signed)
Important Message  Patient Details IM Letter given to Gabriel Earing RN Case Manager to present to the Patient Name: Amber Bowman MRN: [id number] Date of Birth: 07/10/1955   Medicare Important Message Given:  Yes     Kerin Salen 07/11/2019, 11:42 AM

## 2019-07-11 NOTE — Plan of Care (Signed)
Patient IV and tele removed.  RN assessment and VS revealed stability for DC to home with [facility].  Discharge papers given, explained and educated.  Discussed suggested FU appts and scripts sent to [facility] [place]) per patient request.  Once ready, will be wheeled to front and family transporting home via car. - Waiting on arrival.

## 2019-07-11 NOTE — Progress Notes (Signed)
Patient ID: Amber Bowman, female   DOB: 05/01/1955, 64 y.o.   MRN: [id number]    Subjective: Pt improving gradually.  Moved from ICU to hospital floor over the weekend.  Off CRRT with improving renal function/UOP.    Objective: Vital signs in last 24 hours: Temp:  [98.2 F (36.8 C)-98.8 F (37.1 C)] 98.2 F (36.8 C) (05/04 0440) Pulse Rate:  [76-87] 76 (05/04 0440) Resp:  [9-22] 21 (05/04 0440) BP: (114-126)/(54-75) 114/60 (05/04 0440) SpO2:  [97 %-100 %] 97 % (05/04 0440) Weight:  [69.5 kg] 69.5 kg (05/04 0440)  Intake/Output from previous day: 05/03 0701 - 05/04 0700 In: 2232.8 [P.O.:120; I.V.:1665.4; Blood:341.5; IV Piggyback:105.8] Out: 1525 [Urine:1525] Intake/Output this shift: No intake/output data recorded.   Lab Results: Recent Labs    07/09/19 0315 07/10/19 0519 07/11/19 0409  HGB 7.5* 7.0* 7.8*  HCT 24.1* 22.4* 24.8*   CBC Latest Ref Rng & Units 07/11/2019 07/10/2019 07/09/2019  WBC 4.0 - 10.5 K/uL 10.1 12.0(H) 15.7(H)  Hemoglobin 12.0 - 15.0 g/dL 7.8(L) 7.0(L) 7.5(L)  Hematocrit 36.0 - 46.0 % 24.8(L) 22.4(L) 24.1(L)  Platelets 150 - 400 K/uL 452(H) 463(H) 351     BMET Recent Labs    07/10/19 0519 07/11/19 0409  NA 139 138  K 3.4* 3.7  CL 112* 112*  CO2 18* 18*  GLUCOSE 93 103*  BUN 33* 32*  CREATININE 2.17* 1.93*  CALCIUM 8.1* 8.3*     Studies/Results: No results found.  Assessment/Plan: 1) Left ureteral calculus: Complete course of culture specific antibiotics.  D/C home with nephrostomy.  I will arrange outpatient follow up to re-evaluate and plan for definitive treatment of stone.   LOS: 14 days   Dutch Gray 07/11/2019, 7:41 AM

## 2019-07-11 NOTE — Discharge Summary (Signed)
Physician Discharge Summary  Amber Bowman [name] DOB: [birth year] DOA: 06/27/2019  PCP: Huel Cote, NP  Admit date: 06/27/2019 Discharge date: 07/11/2019  Admitted From: Home Disposition:  Home  Discharge Condition:Stable CODE STATUS:FULL Diet recommendation:Dysphagia 3 diet  Brief/Interim Summary:  Patient is a 64 year old female with past medical history of seizures, depression admitted on 4/20 with septic shock secondary to pyelonephritis. Patient was admitted to the critical care service. Despite aggressive fluid resuscitation, pressor support and IV antibiotics, patient's creatinine continued to decline. Nephrology was consulted. Patient had a nephrostomy tube placed by interventional radiology as well as an hemodialysis catheter and on 4/22, CRRT was started. By 4/23, patient able to be weaned off of pressors and by 4/30, able to make much more urine on her own and patient transferred out of ICU to the hospitalist service. On night of 4/29, patient confused and agitated, accidentally pulled out her hemodialysis catheter. No active bleeding. Currently she has significantly improved, hemodynamically stable and maintaining her saturation on room air.Her only complaint is of mucosal oral ulcerations, felt to be secondary to Covid. Has been off of CRRT now for 4 days and renal function continues to improve with good urine output.  Nephrology he has signed off.  Hospital course also remarkable for bacteremia with E. Coli, she completed the course of  IV antibiotics.  PT/OT recommended home health. Her hemoglobin dropped to 7 so she was also  transfused with a unit of PRBC.  Plan for discharge to home today with home health.  Following problems were addressed during her hospitalization:   Septic shock due to Escherichia coli [facility]) from pyelonephritis: Patient met criteria for septic shock on admission given tachycardia, pyelonephritis source, lactic acidosis and persistent  hypotension despite IV fluids requiring pressor support causing acute kidney injury. Blood cultures grew out E. coli.  Sepsis felt to be resolved. Renal function not yet normalized although her creatinine continues to improve and she has been now off of dialysis since 4/29. Nephrology was following,now have signed off. She completed antibiotic course  Epilepsy : On Keppra,felbamate and Topamax.  She follows with neurology as an outpatient  Mood disorder, unspecified: She had bizzarre behavior during this hospitalization with intermittent moaning, screaming.Nursing has discussed these episodes with her husband who confided in them that this is not new and he suspects that she has an undiagnosed mental health disorder. She will  benefit with psychiatric consultation as an outpatient.  COVID-19 virus infection: Incidentally diagnosed on admission. Patient started on IV remdesivir and dexamethasone,completed the course. She is considered contagious until 5/11. No evidence of any respiratory compromise. Oral ulcers felt to be likely related.  Maintaining her saturation on room air  Acute metabolic encephalopathy: Not really encephalopathy, may be more of a mood disorder as specified above.  Currently alert and oriented  Chronic systolic heart failure: Noted on echocardiogram. As she is auto diuresing, this should help her volumes.   Leukocytosis: Secondary to infection plus steroids. Resolving.  Thrombocytopenia:Secondary to sepsis. Now resolved.  Debility/deconditioning: PT recommended home health.  Hypokalemia: Supplemented with potassium  Normocytic anemia: Most likely associated  with her acute medical conditions, acute renal failure.  Iron studies showed normal iron.  No evidence of acute blood loss.We  transfused her with a unit of PRBC.    Hb in the range of 7 today.  Check CBC in a week.  Dysphagia: Speech therapy evaluated her and recommended dysphagia 3  diet.   Discharge Diagnoses:  Principal Problem:  Septic shock due to Escherichia coli [facility]) Active Problems:   Epilepsy ([place])   Acute renal failure (ARF) (HCC)   Overweight (BMI 25.0-29.9)   COVID-19 virus infection   Acute pyelonephritis   Unspecified mood (affective) disorder (HCC)   Leukocytosis   Thrombocytopenia (HCC)   Chronic systolic CHF (congestive heart failure) [facility])    Discharge Instructions  Discharge Instructions    Diet - low sodium heart healthy   Complete by: As directed    Dysphagia 3 diet   Discharge instructions   Complete by: As directed    1)Please take prescribed medications as instructed. 2)Follow up with your PCP in a week.Do a CBC and BMP test during the follow up. 3)Follow up with [facility].You will called for appointment  4)Isolate yourself for the next one week. 5)Infection Prevention Recommendations for Individuals Confirmed to have, or Being Evaluated for, 2019 Novel Coronavirus (COVID-19) Infection Who Receive Care at Home  Individuals who are confirmed to have, or are being evaluated for, COVID-19 should follow the prevention steps below until a healthcare provider or local or state health department says they can return to normal activities.  Stay home except to get medical care You should restrict activities outside your home, except for getting medical care. Do not go to work, school, or public areas, and do not use public transportation or taxis.  Call ahead before visiting your doctor Before your medical appointment, call the healthcare provider and tell them that you have, or are being evaluated for, COVID-19 infection. This will help the healthcare provider's office take steps to keep other people from getting infected. Ask your healthcare provider to call the local or state health department.  Monitor your symptoms Seek prompt medical attention if your illness is worsening (e.g., difficulty breathing). Before going  to your medical appointment, call the healthcare provider and tell them that you have, or are being evaluated for, COVID-19 infection. Ask your healthcare provider to call the local or state health department.  Wear a facemask You should wear a facemask that covers your nose and mouth when you are in the same room with other people and when you visit a healthcare provider. People who live with or visit you should also wear a facemask while they are in the same room with you.  Separate yourself from other people in your home As much as possible, you should stay in a different room from other people in your home. Also, you should use a separate bathroom, if available.  Avoid sharing household items You should not share dishes, drinking glasses, cups, eating utensils, towels, bedding, or other items with other people in your home. After using these items, you should wash them thoroughly with soap and water.  Cover your coughs and sneezes Cover your mouth and nose with a tissue when you cough or sneeze, or you can cough or sneeze into your sleeve. Throw used tissues in a lined trash can, and immediately wash your hands with soap and water for at least 20 seconds or use an alcohol-based hand rub.  Wash your [facility] your hands often and thoroughly with soap and water for at least 20 seconds. You can use an alcohol-based hand sanitizer if soap and water are not available and if your hands are not visibly dirty. Avoid touching your eyes, nose, and mouth with unwashed hands.   Prevention Steps for Caregivers and Household Members of Individuals Confirmed to have, or Being Evaluated for, COVID-19 Infection Being Cared for in  the Home  If you live with, or provide care at home for, a person confirmed to have, or being evaluated for, COVID-19 infection please follow these guidelines to prevent infection:  Follow healthcare provider's instructions Make sure that you understand and  can help the patient follow any healthcare provider instructions for all care.  Provide for the patient's basic needs You should help the patient with basic needs in the home and provide support for getting groceries, prescriptions, and other personal needs.  Monitor the patient's symptoms If they are getting sicker, call his or her medical provider and tell them that the patient has, or is being evaluated for, COVID-19 infection. This will help the healthcare provider's office take steps to keep other people from getting infected. Ask the healthcare provider to call the local or state health department.  Limit the number of people who have contact with the patient If possible, have only one caregiver for the patient. Other household members should stay in another home or place of residence. If this is not possible, they should stay in another room, or be separated from the patient as much as possible. Use a separate bathroom, if available. Restrict visitors who do not have an essential need to be in the home.  Keep older adults, very young children, and other sick people away from the patient Keep older adults, very young children, and those who have compromised immune systems or chronic health conditions away from the patient. This includes people with chronic heart, lung, or kidney conditions, diabetes, and cancer.  Ensure good ventilation Make sure that shared spaces in the home have good air flow, such as from an air conditioner or an opened window, weather permitting.  Wash your hands often Wash your hands often and thoroughly with soap and water for at least 20 seconds. You can use an alcohol based hand sanitizer if soap and water are not available and if your hands are not visibly dirty. Avoid touching your eyes, nose, and mouth with unwashed hands. Use disposable paper towels to dry your hands. If not available, use dedicated cloth towels and replace them when they become  wet.  Wear a facemask and gloves Wear a disposable facemask at all times in the room and gloves when you touch or have contact with the patient's blood, body fluids, and/or secretions or excretions, such as sweat, saliva, sputum, nasal mucus, vomit, urine, or feces. Ensure the mask fits over your nose and mouth tightly, and do not touch it during use. Throw out disposable facemasks and gloves after using them. Do not reuse. Wash your hands immediately after removing your facemask and gloves. If your personal clothing becomes contaminated, carefully remove clothing and launder. Wash your hands after handling contaminated clothing. Place all used disposable facemasks, gloves, and other waste in a lined container before disposing them with other household waste. Remove gloves and wash your hands immediately after handling these items.  Do not share dishes, glasses, or other household items with the patient Avoid sharing household items. You should not share dishes, drinking glasses, cups, eating utensils, towels, bedding, or other items with a patient who is confirmed to have, or being evaluated for, COVID-19 infection. After the person uses these items, you should wash them thoroughly with soap and water.  Wash laundry thoroughly Immediately remove and wash clothes or bedding that have blood, body fluids, and/or secretions or excretions, such as sweat, saliva, sputum, nasal mucus, vomit, urine, or feces, on them. Wear gloves when handling  laundry from the patient. Read and follow directions on labels of laundry or clothing items and detergent. In general, wash and dry with the warmest temperatures recommended on the label.  Clean all areas the individual has used often Clean all touchable surfaces, such as counters, tabletops, doorknobs, bathroom fixtures, toilets, phones, keyboards, tablets, and bedside tables, every day. Also, clean any surfaces that may have blood, body fluids, and/or  secretions or excretions on them. Wear gloves when cleaning surfaces the patient has come in contact with. Use a diluted bleach solution (e.g., dilute bleach with 1 part bleach and 10 parts water) or a household disinfectant with a label that says EPA-registered for coronaviruses. To make a bleach solution at home, add 1 tablespoon of bleach to 1 quart (4 cups) of water. For a larger supply, add  cup of bleach to 1 gallon (16 cups) of water. Read labels of cleaning products and follow recommendations provided on product labels. Labels contain instructions for safe and effective use of the cleaning product including precautions you should take when applying the product, such as wearing gloves or eye protection and making sure you have good ventilation during use of the product. Remove gloves and wash hands immediately after cleaning.  Monitor yourself for signs and symptoms of illness Caregivers and household members are considered close contacts, should monitor their health, and will be asked to limit movement outside of the home to the extent possible. Follow the monitoring steps for close contacts listed on the symptom monitoring form.    Increase activity slowly   Complete by: As directed    MyChart COVID-19 home monitoring program   Complete by: Jul 11, 2019    Is the patient willing to use the [facility] for home monitoring?: Yes     Allergies as of 07/11/2019   No Known Allergies     Medication List    TAKE these medications   aspirin 81 MG tablet Take 81 mg by mouth daily.   betamethasone valerate ointment 0.1 % Commonly known as: VALISONE Apply 1 application topically 2 (two) times daily.   CRANBERRY PO Take 2 tablets by mouth daily.   felbamate 600 MG tablet Commonly known as: FELBATOL Take 0.5 tablets (300 mg total) by mouth 2 (two) times daily. What changed: See the new instructions.   guaiFENesin-codeine 100-10 MG/5ML syrup Take 10 mLs by mouth every 4  (four) hours as needed for cough.   ICAPS AREDS FORMULA PO Take by mouth.   levETIRAcetam 500 MG tablet Commonly known as: KEPPRA TAKE 1 TABLET BY MOUTH TWICE A DAY   magic mouthwash w/lidocaine Soln Take 10 mLs by mouth 3 (three) times daily as needed for mouth pain.   metroNIDAZOLE 0.75 % vaginal gel Commonly known as: METROGEL Place 1 Applicatorful vaginally 2 (two) times daily.   topiramate 200 MG tablet Commonly known as: TOPAMAX TAKE 1 TABLET BY MOUTH TWICE A DAY      Follow-up Information    [facility] Follow up.   Specialty: Home Health Services Why: Please call if you have nay questions. Alvis Lemmings will follow you at home with home health.  Contact information: [address] [place] [address] [phone]        Huel Cote, NP. Schedule an appointment as soon as possible for a visit in 1 week(s).   Specialties: Obstetrics and Gynecology, Radiology Contact information: [address] [place] [address] [phone]  No Known Allergies  Consultations:  PCCM, nephrology, urology   Procedures/Studies: CT ABDOMEN PELVIS WO CONTRAST  Result Date: 07/02/2019 CLINICAL DATA:  Abdominal pain, acute with increased white blood cell count. EXAM: CT ABDOMEN AND PELVIS WITHOUT CONTRAST TECHNIQUE: Multidetector CT imaging of the abdomen and pelvis was performed following the standard protocol without IV contrast. COMPARISON:  06/27/2019 FINDINGS: Lower chest: Enlargement of bilateral pleural effusions. Areas of patchy airspace and interstitial opacities abnormality since the previous exam in areas of aerated lung. Moderate basilar volume loss bilaterally. Hepatobiliary: Large gallstone in the gallbladder neck. Gallbladder is less distended than on the prior study. Trace pericholecystic fluid. No pericholecystic stranding. Liver is lobular without focal lesion on noncontrast imaging. No gross biliary ductal  dilation. Pancreas: Pancreas is normal. No signs of inflammation, ductal dilation or lesion. Spleen: Spleen is normal size. No focal lesion. Adrenals/Urinary Tract: Adrenal glands are normal. Nephrolithiasis in the RIGHT kidney with similar appearance to the prior study. LEFT-sided nephrostomy tube remains in place. Moderate sized calculus that was present in the posterior LEFT interpolar calyx on the prior study measuring 4-5 mm has migrated into the renal pelvis near the UPJ just proximal to the dominant calculus. Stomach/Bowel: Moderate size hiatal hernia. Gastric tube in situ, tube terminates in the duodenal bulb, tenting the duodenal bulb. No perigastric stranding. Colonic diverticulosis. Normal appendix. Vascular/Lymphatic: Calcified atheromatous plaque in the abdominal aorta. No aneurysm. No adenopathy in the retroperitoneum or in the upper abdomen. No adenopathy in the pelvis. Reproductive: Unremarkable by CT. Other: Mild body wall edema. Musculoskeletal: No acute bone finding or destructive bone process. Levoconvex curvature of the lumbar spine with similar appearance associated with degenerative change. IMPRESSION: 1. Enlargement of bilateral pleural effusions and patchy airspace and interstitial opacities since the previous study in areas of aerated lung. Findings could represent pneumonitis/infection, correlate with respiratory symptoms. 2. LEFT-sided nephrostomy tube remains in place. Moderate sized calculus that was present in the posterior LEFT interpolar calyx on the prior study measuring 4-5 mm has migrated into the renal pelvis near the UPJ just proximal to the dominant calculus. No hydronephrosis. 3. Large gallstone in the gallbladder neck. Gallbladder is less distended than on the prior study. Trace pericholecystic fluid. No pericholecystic stranding. Correlate with any symptoms or laboratory values that would suggest biliary pathology with HIDA scan as warranted. 4. Gastric tube in situ, tube  terminates in the duodenal bulb, tenting the duodenal bulb. Suggest retraction into the stomach, approximately 5-6 cm retraction. 5. Colonic diverticulosis without evidence of acute diverticulitis. 6. Aortic atherosclerosis. These results will be called to the ordering clinician or representative by the Radiologist Assistant, and communication documented in the PACS or [employer]. Aortic Atherosclerosis (ICD10-I70.0). Electronically Signed   By: Zetta Bills M.D.   On: 07/02/2019 11:39   CT ABDOMEN PELVIS WO CONTRAST  Result Date: 06/27/2019 CLINICAL DATA:  Sepsis EXAM: CT CHEST, ABDOMEN AND PELVIS WITHOUT CONTRAST TECHNIQUE: Multidetector CT imaging of the chest, abdomen and pelvis was performed following the standard protocol without IV contrast. COMPARISON:  None. FINDINGS: CT CHEST FINDINGS Cardiovascular: Heart size is normal. There are atherosclerotic calcifications of the thoracic aorta. Mediastinum/Nodes: Small hiatal hernia. No mediastinal or axillary lymphadenopathy. Lungs/Pleura: Small pleural effusions with basilar atelectasis. No other consolidation. There is an incidentally noted azygos fissure. Central airways are patent. Musculoskeletal: No chest wall mass or suspicious bone lesions identified. CT ABDOMEN PELVIS FINDINGS HEPATOBILIARY: Normal hepatic contours. No intra- or extrahepatic biliary dilatation. There is cholelithiasis with a hydropic gallbladder.  No evidence of acute inflammation. PANCREAS: Normal pancreas. No ductal dilatation or peripancreatic fluid collection. SPLEEN: Normal. ADRENALS/URINARY TRACT: The adrenal glands are normal. There is a stone within the proximal left ureter measuring 10 mm, causing mild hydroureteronephrosis and mild perinephric stranding. Additionally, there are multiple bilateral nonobstructing renal calculi that measure up to 6 mm. The urinary bladder is normal for degree of distention STOMACH/BOWEL: There is a small hiatal hernia. Normal duodenal  course and caliber. No small bowel dilatation or inflammation. No focal colonic abnormality. Normal appendix. VASCULAR/LYMPHATIC: There is calcific atherosclerosis of the abdominal aorta. No abdominal or pelvic lymphadenopathy. REPRODUCTIVE: Normal uterus and ovaries. MUSCULOSKELETAL. No bony spinal canal stenosis or focal osseous abnormality. OTHER: None. IMPRESSION: 1. Left-sided obstructive uropathy with 10 mm stone in the proximal left ureter causing mild hydroureteronephrosis and perinephric stranding. 2. Multiple bilateral nonobstructing renal calculi measuring up to 6 mm. 3. Small pleural effusions with basilar atelectasis. 4. Cholelithiasis with hydropic gallbladder. No evidence of acute inflammation. 5. Aortic Atherosclerosis (ICD10-I70.0). Electronically Signed   By: Ulyses Jarred M.D.   On: 06/27/2019 22:09   DG Chest 1 View  Result Date: 06/28/2019 CLINICAL DATA:  COVID positive, central line placement. EXAM: CHEST  1 VIEW COMPARISON:  Chest radiograph and CT chest 06/27/2019. FINDINGS: Right IJ central line tip is in the SVC. No pneumothorax. Patient is slightly rotated. Trachea is midline. Heart size normal. Next articular airspace opacification with small bilateral pleural effusions. IMPRESSION: 1. Right IJ central line placement without complicating feature. 2. Pulmonary edema and small bilateral pleural effusions. Electronically Signed   By: Lorin Picket M.D.   On: 06/28/2019 11:28   DG Abd 1 View  Result Date: 07/02/2019 CLINICAL DATA:  Evaluate NG tube EXAM: ABDOMEN - 1 VIEW COMPARISON:  None. FINDINGS: The distal tip of the NG tube is likely near the junction of the pylorus and proximal duodenum. A left nephrostomy tube is identified. Bilateral renal stones are noted. IMPRESSION: The NG tube is stable. I suspect the distal tip is within the duodenal bulb near the junction of the pylorus and duodenum. Electronically Signed   By: Dorise Bullion III M.D   On: 07/02/2019 15:31   DG Abd  1 View  Result Date: 06/29/2019 CLINICAL DATA:  64 year old female with feeding tube placement. EXAM: ABDOMEN - 1 VIEW COMPARISON:  CT abdomen pelvis dated 06/27/2019. FINDINGS: An enteric tube is noted with tip in the distal stomach likely in the region of the gastric antrum or pylorus. There is no bowel dilatation or evidence of obstruction. No free air identified. A left percutaneous nephrostomy is noted. Several bilateral renal calculi again seen. The osseous structures and soft tissues are grossly unremarkable. IMPRESSION: Enteric tube with tip in the distal stomach. Electronically Signed   By: Anner Crete M.D.   On: 06/29/2019 15:35   CT CHEST WO CONTRAST  Result Date: 06/27/2019 CLINICAL DATA:  Sepsis EXAM: CT CHEST, ABDOMEN AND PELVIS WITHOUT CONTRAST TECHNIQUE: Multidetector CT imaging of the chest, abdomen and pelvis was performed following the standard protocol without IV contrast. COMPARISON:  None. FINDINGS: CT CHEST FINDINGS Cardiovascular: Heart size is normal. There are atherosclerotic calcifications of the thoracic aorta. Mediastinum/Nodes: Small hiatal hernia. No mediastinal or axillary lymphadenopathy. Lungs/Pleura: Small pleural effusions with basilar atelectasis. No other consolidation. There is an incidentally noted azygos fissure. Central airways are patent. Musculoskeletal: No chest wall mass or suspicious bone lesions identified. CT ABDOMEN PELVIS FINDINGS HEPATOBILIARY: Normal hepatic contours. No intra- or extrahepatic biliary  dilatation. There is cholelithiasis with a hydropic gallbladder. No evidence of acute inflammation. PANCREAS: Normal pancreas. No ductal dilatation or peripancreatic fluid collection. SPLEEN: Normal. ADRENALS/URINARY TRACT: The adrenal glands are normal. There is a stone within the proximal left ureter measuring 10 mm, causing mild hydroureteronephrosis and mild perinephric stranding. Additionally, there are multiple bilateral nonobstructing renal calculi  that measure up to 6 mm. The urinary bladder is normal for degree of distention STOMACH/BOWEL: There is a small hiatal hernia. Normal duodenal course and caliber. No small bowel dilatation or inflammation. No focal colonic abnormality. Normal appendix. VASCULAR/LYMPHATIC: There is calcific atherosclerosis of the abdominal aorta. No abdominal or pelvic lymphadenopathy. REPRODUCTIVE: Normal uterus and ovaries. MUSCULOSKELETAL. No bony spinal canal stenosis or focal osseous abnormality. OTHER: None. IMPRESSION: 1. Left-sided obstructive uropathy with 10 mm stone in the proximal left ureter causing mild hydroureteronephrosis and perinephric stranding. 2. Multiple bilateral nonobstructing renal calculi measuring up to 6 mm. 3. Small pleural effusions with basilar atelectasis. 4. Cholelithiasis with hydropic gallbladder. No evidence of acute inflammation. 5. Aortic Atherosclerosis (ICD10-I70.0). Electronically Signed   By: Ulyses Jarred M.D.   On: 06/27/2019 22:09   US RENAL  Result Date: 06/30/2019 CLINICAL DATA:  Sepsis. EXAM: RENAL / URINARY TRACT ULTRASOUND COMPLETE COMPARISON:  CT scan 06/27/2019 FINDINGS: Right Kidney: Renal measurements: 11.2 x 5.2 x 5.0 cm = volume: 151.7 mL. Normal renal cortical thickness and echogenicity. No worrisome renal lesions or hydronephrosis. 5 mm midpole calculus noted. Left Kidney: Renal measurements: 12.2 x 6.3 x 5.8 cm = volume: 233 mL. Normal renal cortical thickness and echogenicity without renal lesions. A left-sided nephrostomy tube is in place. No hydronephrosis. Small renal calculi are noted. Bladder: Decompressed by a Foley catheter. Other: None. IMPRESSION: 1. Bilateral renal calculi. 2. Left nephrostomy tube in place.  No left-sided hydronephrosis. 3. Bladder decompressed by a Foley catheter. Electronically Signed   By: Marijo Sanes M.D.   On: 06/30/2019 11:08   US Aorta  Result Date: 06/27/2019 CLINICAL DATA:  Back pain, weakness, evaluate for abdominal aortic  aneurysm EXAM: ULTRASOUND OF ABDOMINAL AORTA TECHNIQUE: Ultrasound examination of the abdominal aorta and proximal common iliac arteries was performed to evaluate for aneurysm. Additional color and Doppler images of the distal aorta were obtained to document patency. COMPARISON:  None. FINDINGS: Abdominal aortic measurements as follows: Proximal:  2.2 cm Mid:  1.8 cm Distal:  1.7 cm Patent: Yes, peak systolic velocity is 61 cm/s Right common iliac artery: Not visualized cm Left common iliac artery: Not visualized cm IMPRESSION: 1.  No abdominal aortic aneurysm identified. 2. Absence of aortic aneurysm by ultrasound does not exclude aortic dissection, intramural hematoma, or other acute aortic pathology. Consider CT angiogram to further evaluate if there is high clinical concern for aortic pathology in the setting of acute pain. Electronically Signed   By: Eddie Candle M.D.   On: 06/27/2019 17:03   DG Chest Port 1 View  Result Date: 07/02/2019 CLINICAL DATA:  64 year old female with history of respiratory failure. EXAM: PORTABLE CHEST 1 VIEW COMPARISON:  Chest x-ray 07/01/2019. FINDINGS: There is a right-sided internal jugular central venous catheter with tip terminating in the mid superior vena cava. A nasogastric tube is seen extending into the stomach, however, the tip of the nasogastric tube extends below the lower margin of the image. Lung volumes are normal. There continues to be patchy ill-defined areas of airspace consolidation and interstitial prominence scattered throughout the lungs bilaterally, but overall aeration has significantly improved compared to yesterday's  examination. No definite pleural effusions. Heart size is normal. Upper mediastinal contours are within normal limits. Aortic atherosclerosis. IMPRESSION: 1. Support apparatus, as above. 2. Improving aeration throughout the lungs bilaterally which could reflect resolving edema and/or multilobar pneumonia. 3. Aortic atherosclerosis.  Electronically Signed   By: Vinnie Langton M.D.   On: 07/02/2019 06:49   DG CHEST PORT 1 VIEW  Result Date: 07/01/2019 CLINICAL DATA:  COVID-19 positive.  Shortness of breath. EXAM: PORTABLE CHEST 1 VIEW COMPARISON:  June 29, 2019 FINDINGS: The right central line terminates in the central SVC, advanced in the interval. The NG tube terminates below today's film. No pneumothorax. Bilateral pulmonary infiltrates have worsened in the interval, particularly on the left. No change in the cardiomediastinal silhouette. IMPRESSION: 1. Support apparatus as above. 2. Worsening pulmonary infiltrates. Electronically Signed   By: Dorise Bullion III M.D   On: 07/01/2019 13:12   DG CHEST PORT 1 VIEW  Result Date: 06/29/2019 CLINICAL DATA:  Fever, elevated heart rate, LEFT-sided flank pain. EXAM: PORTABLE CHEST 1 VIEW COMPARISON:  Chest x-rays dated 06/28/2019 and 06/27/2019. FINDINGS: Heart size and mediastinal contours are stable. Central pulmonary vascular congestion and bilateral perihilar interstitial edema. Small bilateral pleural effusions and bibasilar atelectasis better demonstrated on recent chest CT. No pneumothorax is seen. RIGHT IJ central line is stable in position with tip at the level of the upper SVC. IMPRESSION: 1. Central pulmonary vascular congestion and bilateral perihilar interstitial edema suggesting volume overload/CHF. 2. No evidence of pneumonia. 3. Small bilateral pleural effusions and bibasilar atelectasis better demonstrated on recent chest CT. Electronically Signed   By: Franki Cabot M.D.   On: 06/29/2019 13:09   DG Chest Port 1 View  Result Date: 06/27/2019 CLINICAL DATA:  LEFT-sided back pain EXAM: PORTABLE CHEST 1 VIEW COMPARISON:  None. FINDINGS: Normal cardiac silhouette. Mild venous congestion. Prominent azygos fissure. No pneumothorax. No pulmonary edema. No infiltrate. No acute osseous abnormality. IMPRESSION: Central venous congestion. Electronically Signed   By: Suzy Bouchard M.D.   On: 06/27/2019 15:02   ECHOCARDIOGRAM COMPLETE  Result Date: 06/30/2019    ECHOCARDIOGRAM REPORT   Patient Name:   Anysha ANN Simkins Date of Exam: 06/30/2019 Medical Rec #:  [id number]      Height:       64.0 in Accession #:    [id number]     Weight:       187.4 lb Date of Birth:  Apr 07, 1955     BSA:          1.903 m Patient Age:    58 years       BP:           143/65 mmHg Patient Gender: F              HR:           63 bpm. Exam Location:  Inpatient Procedure: 2D Echo Indications:    Dyspnea                 ; R06.9 DOE  History:        Patient has no prior history of Echocardiogram examinations.                 Signs/Symptoms:Dyspnea.  Sonographer:    Raquel Sarna Senior Referring Phys: [place]  1. Diffuse hypokinesis with abnormal septal motion . Left ventricular ejection fraction, by estimation, is 40 to 45%. The left ventricle has mildly decreased function. The left ventricle demonstrates global  hypokinesis. Left ventricular diastolic parameters were normal.  2. Right ventricular systolic function is normal. The right ventricular size is normal. There is mildly elevated pulmonary artery systolic pressure.  3. Cannot r/o PFO.  4. Right atrial size was mildly dilated.  5. The mitral valve is normal in structure. Mild mitral valve regurgitation. No evidence of mitral stenosis.  6. Tricuspid valve regurgitation is moderate.  7. The aortic valve is tricuspid. Aortic valve regurgitation is not visualized. No aortic stenosis is present.  8. The inferior vena cava is dilated in size with <50% respiratory variability, suggesting right atrial pressure of 15 mmHg. FINDINGS  Left Ventricle: Diffuse hypokinesis with abnormal septal motion. Left ventricular ejection fraction, by estimation, is 40 to 45%. The left ventricle has mildly decreased function. The left ventricle demonstrates global hypokinesis. The left ventricular internal cavity size was normal in size. There is no left  ventricular hypertrophy. Left ventricular diastolic parameters were normal. Right Ventricle: The right ventricular size is normal. No increase in right ventricular wall thickness. Right ventricular systolic function is normal. There is mildly elevated pulmonary artery systolic pressure. The tricuspid regurgitant velocity is 2.73  m/s, and with an assumed right atrial pressure of 10 mmHg, the estimated right ventricular systolic pressure is 10.2 mmHg. Left Atrium: Left atrial size was normal in size. Right Atrium: Right atrial size was mildly dilated. Pericardium: There is no evidence of pericardial effusion. Mitral Valve: The mitral valve is normal in structure. There is mild thickening of the mitral valve leaflet(s). There is mild calcification of the mitral valve leaflet(s). Normal mobility of the mitral valve leaflets. Mild mitral valve regurgitation. No evidence of mitral valve stenosis. Tricuspid Valve: The tricuspid valve is normal in structure. Tricuspid valve regurgitation is moderate . No evidence of tricuspid stenosis. Aortic Valve: The aortic valve is tricuspid. Aortic valve regurgitation is not visualized. No aortic stenosis is present. Pulmonic Valve: The pulmonic valve was normal in structure. Pulmonic valve regurgitation is mild. No evidence of pulmonic stenosis. Aorta: The aortic root is normal in size and structure. Venous: The inferior vena cava is dilated in size with less than 50% respiratory variability, suggesting right atrial pressure of 15 mmHg. IAS/Shunts: The interatrial septum was not well visualized.  LEFT VENTRICLE PLAX 2D LVIDd:         4.64 cm     Diastology LVIDs:         3.40 cm     LV e' lateral:   6.85 cm/s LV PW:         0.86 cm     LV E/e' lateral: 9.6 LV IVS:        0.78 cm     LV e' medial:    6.42 cm/s LVOT diam:     2.00 cm     LV E/e' medial:  10.3 LV SV:         46 LV SV Index:   24 LVOT Area:     3.14 cm  LV Volumes (MOD) LV vol d, MOD A4C: 80.7 ml LV vol s, MOD A4C: 43.0  ml LV SV MOD A4C:     80.7 ml RIGHT VENTRICLE RV S prime:     7.94 cm/s TAPSE (M-mode): 2.1 cm LEFT ATRIUM             Index       RIGHT ATRIUM           Index LA diam:        3.70  cm 1.94 cm/m  RA Area:     19.70 cm LA Vol (A2C):   47.7 ml 25.07 ml/m RA Volume:   58.10 ml  30.53 ml/m LA Vol (A4C):   48.7 ml 25.59 ml/m LA Biplane Vol: 49.9 ml 26.22 ml/m  AORTIC VALVE LVOT Vmax:   65.00 cm/s LVOT Vmean:  42.900 cm/s LVOT VTI:    0.148 m  AORTA Ao Root diam: 3.00 cm Ao Asc diam:  3.30 cm MITRAL VALVE               TRICUSPID VALVE MV Area (PHT): 3.99 cm    TR Peak grad:   29.8 mmHg MV Decel Time: 190 msec    TR Vmax:        273.00 cm/s MV E velocity: 66.00 cm/s MV A velocity: 50.40 cm/s  SHUNTS MV E/A ratio:  1.31        Systemic VTI:  0.15 m                            Systemic Diam: 2.00 cm Jenkins Rouge MD Electronically signed by Jenkins Rouge MD Signature Date/Time: 06/30/2019/5:41:08 PM    Final    IR NEPHROSTOMY PLACEMENT LEFT  Result Date: 06/28/2019 INDICATION: 64 year old female with urosepsis secondary to proximal ureteral stone and obstructed pyonephrosis on the left. She is critically ill and too unstable for general anesthesia and retrograde ureteral stent placement. Therefore, she presents to interventional radiology for percutaneous nephrostomy tube placement. EXAM: IR NEPHROSTOMY PLACEMENT LEFT COMPARISON:  CT abdomen/pelvis 06/27/2019 MEDICATIONS: Ancef 2 gm IV; The antibiotic was administered in an appropriate time frame prior to skin puncture. ANESTHESIA/SEDATION: Fentanyl 50 mcg IV; Versed 1 mg IV Moderate Sedation Time:  14 minutes The patient was continuously monitored during the procedure by the interventional radiology nurse under my direct supervision. CONTRAST:  2m OMNIPAQUE IOHEXOL 300 MG/ML SOLN - administered into the collecting system(s) FLUOROSCOPY TIME:  Fluoroscopy Time: 2 minutes 36 seconds (36 mGy). COMPLICATIONS: None immediate. TECHNIQUE: The procedure, risks, benefits,  and alternatives were explained to the patient. Questions regarding the procedure were encouraged and answered. The patient understands and consents to the procedure. The left flank was prepped with chlorhexidine in a sterile fashion, and a sterile drape was applied covering the operative field. A sterile gown and sterile gloves were used for the procedure. Local anesthesia was provided with 1% Lidocaine. The left flank was interrogated with ultrasound and the left kidney identified. The kidney is hydronephrotic. A suitable access site on the skin overlying the lower pole, posterior calix was identified. After local mg anesthesia was achieved, a small skin nick was made with an 11 blade scalpel. A 21 gauge Accustick needle was then advanced under direct sonographic guidance into the lower pole of the kidney. A 0.018 inch wire was advanced under fluoroscopic guidance into the left renal collecting system. The Accustick sheath was then advanced over the wire and a 0.018 system exchanged for a 0.035 system. Gentle hand injection of contrast material confirms placement of the sheath within the renal collecting system. There is mild hydronephrosis. The tract from the scan into the renal collecting system was then dilated serially to 10-French. A 10-French Cook all-purpose drain was then placed and positioned under fluoroscopic guidance. The locking loop is well formed within the left renal pelvis. The catheter was secured to the skin with 2-0 Prolene and a sterile bandage was placed. Catheter was left to gravity bag  drainage. IMPRESSION: Successful placement of a left 10 French percutaneous nephrostomy tube. Electronically Signed   By: Jacqulynn Cadet M.D.   On: 06/28/2019 16:06   VAS [place] LOWER EXTREMITY VENOUS (DVT)  Result Date: 06/30/2019  Lower Venous DVTStudy Indications: Hypoxia, COVID.  Comparison Study: no prior Performing Technologist: June Leap RDMS, RVT  Examination Guidelines: A complete evaluation  includes B-mode imaging, spectral Doppler, color Doppler, and power Doppler as needed of all accessible portions of each vessel. Bilateral testing is considered an integral part of a complete examination. Limited examinations for reoccurring indications may be performed as noted. The reflux portion of the exam is performed with the patient in reverse Trendelenburg.  +---------+---------------+---------+-----------+----------+--------------+ RIGHT    CompressibilityPhasicitySpontaneityPropertiesThrombus Aging +---------+---------------+---------+-----------+----------+--------------+ CFV      Full           Yes      Yes                                 +---------+---------------+---------+-----------+----------+--------------+ SFJ      Full                                                        +---------+---------------+---------+-----------+----------+--------------+ FV Prox  Full                                                        +---------+---------------+---------+-----------+----------+--------------+ FV Mid   Full                                                        +---------+---------------+---------+-----------+----------+--------------+ FV DistalFull                                                        +---------+---------------+---------+-----------+----------+--------------+ PFV      Full                                                        +---------+---------------+---------+-----------+----------+--------------+ POP      Full           Yes      Yes                                 +---------+---------------+---------+-----------+----------+--------------+ PTV      Full                                                        +---------+---------------+---------+-----------+----------+--------------+  PERO     Full                                                         +---------+---------------+---------+-----------+----------+--------------+   +---------+---------------+---------+-----------+----------+--------------+ LEFT     CompressibilityPhasicitySpontaneityPropertiesThrombus Aging +---------+---------------+---------+-----------+----------+--------------+ CFV      Full           Yes      Yes                                 +---------+---------------+---------+-----------+----------+--------------+ SFJ      Full                                                        +---------+---------------+---------+-----------+----------+--------------+ FV Prox  Full                                                        +---------+---------------+---------+-----------+----------+--------------+ FV Mid   Full                                                        +---------+---------------+---------+-----------+----------+--------------+ FV DistalFull                                                        +---------+---------------+---------+-----------+----------+--------------+ PFV      Full                                                        +---------+---------------+---------+-----------+----------+--------------+ POP      Full           Yes      Yes                                 +---------+---------------+---------+-----------+----------+--------------+ PTV      Full                                                        +---------+---------------+---------+-----------+----------+--------------+ PERO     Full                                                        +---------+---------------+---------+-----------+----------+--------------+  Summary: RIGHT: - There is no evidence of deep vein thrombosis in the lower extremity.  - No cystic structure found in the popliteal fossa.  LEFT: - There is no evidence of deep vein thrombosis in the lower extremity.  - No cystic structure found in the popliteal fossa.   *See table(s) above for measurements and observations. Electronically signed by Harold Barban MD on 06/30/2019 at 5:08:42 PM.    Final    US Abdomen Limited RUQ  Result Date: 06/27/2019 CLINICAL DATA:  Right upper quadrant pain. EXAM: ULTRASOUND ABDOMEN LIMITED RIGHT UPPER QUADRANT COMPARISON:  None. FINDINGS: Gallbladder: Multiple gallstones evident, measuring up to 3.3 cm diameter. Gallbladder wall thickness upper normal at 3 mm. No pericholecystic fluid. Sonographer reports no sonographic Murphy sign. Common bile duct: Diameter: 8 mm common duct diameter in the hepatoduodenal ligament. Liver: No focal abnormality evident with subtle increase in parenchymal echotexture suggesting component of fatty deposition. Portal vein is patent on color Doppler imaging with normal direction of blood flow towards the liver. Other: None. IMPRESSION: Cholelithiasis with mild extrahepatic biliary duct distension. Correlation with liver function test recommended. Gallbladder wall thickness upper normal without pericholecystic fluid or sonographic Murphy sign. Electronically Signed   By: Misty Stanley M.D.   On: 06/27/2019 17:07       Subjective: Patient seen and examined at the bedside this morning.  Hemodynamically stable for discharge today.  Discharge Exam: Vitals:   07/10/19 2119 07/11/19 0440  BP:  114/60  Pulse:  76  Resp:  (!) 21  Temp:  98.2 F (36.8 C)  SpO2: 99% 97%   Vitals:   07/10/19 1851 07/10/19 2024 07/10/19 2119 07/11/19 0440  BP: 122/74 (!) 115/57  114/60  Pulse:  87  76  Resp:  (!) 22  (!) 21  Temp: 98.4 F (36.9 C) 98.6 F (37 C)  98.2 F (36.8 C)  TempSrc: Oral Oral  Oral  SpO2:  98% 99% 97%  Weight:    69.5 kg  Height:        General: Pt is alert, awake, not in acute distress Cardiovascular: RRR, S1/S2 +, no rubs, no gallops Respiratory: CTA bilaterally, no wheezing, no rhonchi Abdominal: Soft, NT, ND, bowel sounds + Extremities: no edema, no cyanosis    The results  of significant diagnostics from this hospitalization (including imaging, microbiology, ancillary and laboratory) are listed below for reference.     Microbiology: Recent Results (from the past 240 hour(s))  Culture, blood (routine x 2)     Status: None   Collection Time: 07/02/19 11:32 AM   Specimen: BLOOD  Result Value Ref Range Status   Specimen Description   Final    BLOOD BLOOD LEFT HAND Performed at [facility] [address] [place], [place] [address]    Special Requests   Final    BOTTLES DRAWN AEROBIC ONLY Blood Culture adequate volume Performed at [facility] [address], [place], [place] [address]    Culture   Final    NO GROWTH 5 DAYS Performed at [facility], [place] [address], [place],  [address]    Report Status 07/07/2019 FINAL  Final  Culture, blood (routine x 2)     Status: None   Collection Time: 07/02/19 11:32 AM   Specimen: BLOOD  Result Value Ref Range Status   Specimen Description   Final    BLOOD BLOOD LEFT HAND Performed at [facility] [address] [place],  [place] [address]    Special Requests   Final    BOTTLES DRAWN AEROBIC ONLY Blood Culture adequate volume Performed at [facility] [address] [place], [place] [address]    Culture   Final    NO GROWTH 5 DAYS Performed at [facility], [place] [address] [place], [place] [address]    Report Status 07/07/2019 FINAL  Final  C Difficile Quick Screen (NO PCR Reflex)     Status: None   Collection Time: 07/02/19 12:28 PM   Specimen: STOOL  Result Value Ref Range Status   C Diff antigen NEGATIVE NEGATIVE Final   C Diff toxin NEGATIVE NEGATIVE Final   C Diff interpretation No C. difficile detected.  Final    Comment: Performed at [facility], [place] [address], [place], [place] [address]     Labs: BNP (last 3 results) Recent Labs    07/10/19 0519  BNP 338.5*   Basic  Metabolic Panel: Recent Labs  Lab 07/07/19 1718 07/08/19 0217 07/08/19 1358 07/09/19 0315 07/10/19 0519 07/11/19 0409  NA 135 136  --  136 139 138  K 3.8 3.6  --  3.3* 3.4* 3.7  CL 104 104  --  107 112* 112*  CO2 21* 21*  --  19* 18* 18*  GLUCOSE 109* 104*  --  130* 93 103*  BUN 53* 50*  --  41* 33* 32*  CREATININE 2.68* 2.59*  --  2.41* 2.17* 1.93*  CALCIUM 7.6* 7.5*  --  7.6* 8.1* 8.3*  MG  --   --  2.3  --   --   --   PHOS 6.1* 5.6*  --  6.1* 5.3* 5.0*   Liver Function Tests: Recent Labs  Lab 07/05/19 0425 07/05/19 1600 07/06/19 0314 07/06/19 1700 07/07/19 1718 07/08/19 0217 07/09/19 0315 07/10/19 0519 07/11/19 0409  AST 21  --  19  --   --   --   --   --   --   ALT 27  --  23  --   --   --   --   --   --   ALKPHOS 219*  --  189*  --   --   --   --   --   --   BILITOT 0.6  --  0.7  --   --   --   --   --   --   PROT 5.9*  --  5.8*  --   --   --   --   --   --   ALBUMIN 2.2*   < > 2.0*  2.0*   < > 2.2* 2.0* 2.1* 2.0* 2.3*   < > = values in this interval not displayed.   No results for input(s): LIPASE, AMYLASE in the last 168 hours. No results for input(s): AMMONIA in the last 168 hours. CBC: Recent Labs  Lab 07/07/19 0300 07/08/19 0217 07/09/19 0315 07/10/19 0519 07/11/19 0409  WBC 29.4* 18.9* 15.7* 12.0* 10.1  NEUTROABS 19.9* 12.7* 11.1* 6.9 6.9  HGB 7.5* 7.6* 7.5* 7.0* 7.8*  HCT 23.6* 24.5* 24.1* 22.4* 24.8*  MCV 96.7 97.2 97.2 100.0 96.5  PLT 221 305 351 463* 452*   Cardiac Enzymes: No results for input(s): CKTOTAL, CKMB, CKMBINDEX, TROPONINI in the last 168 hours. BNP: Invalid input(s): POCBNP CBG: Recent Labs  Lab 07/10/19 1639 07/10/19 2021 07/11/19 0019 07/11/19 0437 07/11/19 0735  GLUCAP 97 111* 111* 92 85  D-Dimer No results for input(s): DDIMER in the last 72 hours. Hgb A1c No results for input(s): HGBA1C in the last 72 hours. Lipid Profile No results for input(s): CHOL, HDL, LDLCALC, TRIG, CHOLHDL, LDLDIRECT in the last 72  hours. Thyroid function studies No results for input(s): TSH, T4TOTAL, T3FREE, THYROIDAB in the last 72 hours.  Invalid input(s): FREET3 Anemia work up Recent Labs    07/10/19 0519  FERRITIN 178  TIBC 295  IRON 87   Urinalysis    Component Value Date/Time   COLORURINE YELLOW 06/28/2019 1418   APPEARANCEUR CLOUDY (A) 06/28/2019 1418   LABSPEC 1.025 06/28/2019 1418   PHURINE 5.0 06/28/2019 1418   GLUCOSEU NEGATIVE 06/28/2019 1418   HGBUR LARGE (A) 06/28/2019 1418   BILIRUBINUR MODERATE (A) 06/28/2019 1418   KETONESUR 5 (A) 06/28/2019 1418   PROTEINUR 100 (A) 06/28/2019 1418   UROBILINOGEN 0.2 02/12/2012 0842   NITRITE POSITIVE (A) 06/28/2019 1418   LEUKOCYTESUR MODERATE (A) 06/28/2019 1418   Sepsis Labs Invalid input(s): PROCALCITONIN,  WBC,  LACTICIDVEN Microbiology Recent Results (from the past 240 hour(s))  Culture, blood (routine x 2)     Status: None   Collection Time: 07/02/19 11:32 AM   Specimen: BLOOD  Result Value Ref Range Status   Specimen Description   Final    BLOOD BLOOD LEFT HAND Performed at [facility] [address], [place], [place] [address]    Special Requests   Final    BOTTLES DRAWN AEROBIC ONLY Blood Culture adequate volume Performed at [facility] [address], [place], [place] [address]    Culture   Final    NO GROWTH 5 DAYS Performed at [facility], [place] [address] [place], [place] [address]    Report Status 07/07/2019 FINAL  Final  Culture, blood (routine x 2)     Status: None   Collection Time: 07/02/19 11:32 AM   Specimen: BLOOD  Result Value Ref Range Status   Specimen Description   Final    BLOOD BLOOD LEFT HAND Performed at [facility] [address], [place], [place] [address]    Special Requests   Final    BOTTLES DRAWN AEROBIC ONLY Blood Culture adequate volume Performed at [facility] [address], [place], [place] [address]     Culture   Final    NO GROWTH 5 DAYS Performed at [facility], [place] [address] [address], [place], [place] [address]    Report Status 07/07/2019 FINAL  Final  C Difficile Quick Screen (NO PCR Reflex)     Status: None   Collection Time: 07/02/19 12:28 PM   Specimen: STOOL  Result Value Ref Range Status   C Diff antigen NEGATIVE NEGATIVE Final   C Diff toxin NEGATIVE NEGATIVE Final   C Diff interpretation No C. difficile detected.  Final    Comment: Performed at [facility], [place] [address], [place],  [address]    Please note: You were cared for by a hospitalist during your hospital stay. Once you are discharged, your primary care physician will handle any further medical issues. Please note that NO REFILLS for any discharge medications will be authorized once you are discharged, as it is imperative that you return to your primary care physician (or establish a relationship with a primary care physician if you do not have one) for your post hospital discharge needs so that they can reassess your need for medications and  monitor your lab values.    Time coordinating discharge: 40 minutes  SIGNED:   Shelly Coss, MD  [facility] 07/11/2019, 11:00 AM Pager [phone]  If 7PM-7AM, please contact night-coverage [email or web address] Password [email or web address]

## 2019-07-18 DIAGNOSIS — G40909 Epilepsy, unspecified, not intractable, without status epilepticus: Secondary | ICD-10-CM | POA: Diagnosis not present

## 2019-07-18 DIAGNOSIS — R6521 Severe sepsis with septic shock: Secondary | ICD-10-CM | POA: Diagnosis not present

## 2019-07-18 DIAGNOSIS — D696 Thrombocytopenia, unspecified: Secondary | ICD-10-CM | POA: Diagnosis not present

## 2019-07-18 DIAGNOSIS — A4151 Sepsis due to Escherichia coli [E. coli]: Secondary | ICD-10-CM | POA: Diagnosis not present

## 2019-07-18 DIAGNOSIS — U071 COVID-19: Secondary | ICD-10-CM | POA: Diagnosis not present

## 2019-07-18 DIAGNOSIS — I5022 Chronic systolic (congestive) heart failure: Secondary | ICD-10-CM | POA: Diagnosis not present

## 2019-07-18 DIAGNOSIS — N10 Acute tubulo-interstitial nephritis: Secondary | ICD-10-CM | POA: Diagnosis not present

## 2019-07-20 ENCOUNTER — Other Ambulatory Visit: Payer: Self-pay | Admitting: Obstetrics & Gynecology

## 2019-08-11 DIAGNOSIS — N201 Calculus of ureter: Secondary | ICD-10-CM | POA: Diagnosis not present

## 2019-08-16 ENCOUNTER — Other Ambulatory Visit: Payer: Self-pay | Admitting: Urology

## 2019-08-17 ENCOUNTER — Other Ambulatory Visit (HOSPITAL_COMMUNITY): Payer: Self-pay | Admitting: Interventional Radiology

## 2019-08-17 ENCOUNTER — Ambulatory Visit (HOSPITAL_COMMUNITY)
Admission: RE | Admit: 2019-08-17 | Discharge: 2019-08-17 | Disposition: A | Discharge status: Home / Self Care | Payer: Medicare Other | Source: Ambulatory Visit | Attending: Interventional Radiology | Admitting: Interventional Radiology

## 2019-08-17 DIAGNOSIS — N1339 Other hydronephrosis: Secondary | ICD-10-CM

## 2019-08-17 DIAGNOSIS — Z436 Encounter for attention to other artificial openings of urinary tract: Secondary | ICD-10-CM | POA: Insufficient documentation

## 2019-08-17 HISTORY — PX: IR NEPHROSTOMY EXCHANGE LEFT: IMG6069

## 2019-08-17 MED ORDER — LIDOCAINE HCL 1 % IJ SOLN
INTRAMUSCULAR | Status: AC
  Filled 2019-08-17: qty 20

## 2019-08-17 MED ORDER — IOHEXOL 300 MG/ML  SOLN
50.0000 mL | Freq: Once | INTRAMUSCULAR | Status: AC | PRN
  Administered 2019-08-17: 15 mL

## 2019-08-17 MED ORDER — LIDOCAINE HCL (PF) 1 % IJ SOLN
INTRAMUSCULAR | Status: DC | PRN
  Administered 2019-08-17: 10 mL

## 2019-08-17 NOTE — Procedures (Signed)
Interventional Radiology Procedure Note  Procedure: Exchange of LEFT PCN.   Complications: None  Estimated Blood Loss: None  Recommendations: - Tube partially obstructed by stone debris.  - Recommend next exchange in 6 weeks  Signed,  Criselda Peaches, MD

## 2019-08-18 DIAGNOSIS — E785 Hyperlipidemia, unspecified: Secondary | ICD-10-CM | POA: Diagnosis not present

## 2019-08-18 DIAGNOSIS — Z0000 Encounter for general adult medical examination without abnormal findings: Secondary | ICD-10-CM | POA: Diagnosis not present

## 2019-08-18 DIAGNOSIS — D696 Thrombocytopenia, unspecified: Secondary | ICD-10-CM | POA: Diagnosis not present

## 2019-08-18 DIAGNOSIS — Z862 Personal history of diseases of the blood and blood-forming organs and certain disorders involving the immune mechanism: Secondary | ICD-10-CM | POA: Diagnosis not present

## 2019-08-18 DIAGNOSIS — G40909 Epilepsy, unspecified, not intractable, without status epilepticus: Secondary | ICD-10-CM | POA: Diagnosis not present

## 2019-08-18 DIAGNOSIS — E559 Vitamin D deficiency, unspecified: Secondary | ICD-10-CM | POA: Diagnosis not present

## 2019-08-18 DIAGNOSIS — I5021 Acute systolic (congestive) heart failure: Secondary | ICD-10-CM | POA: Diagnosis not present

## 2019-08-24 ENCOUNTER — Encounter (HOSPITAL_COMMUNITY): Payer: Self-pay

## 2019-08-24 ENCOUNTER — Encounter (HOSPITAL_COMMUNITY)
Admission: RE | Admit: 2019-08-24 | Discharge: 2019-08-24 | Disposition: A | Discharge status: Home / Self Care | Payer: Medicare Other | Source: Ambulatory Visit | Attending: Urology | Admitting: Urology

## 2019-08-24 ENCOUNTER — Other Ambulatory Visit: Payer: Self-pay

## 2019-08-24 DIAGNOSIS — Z01818 Encounter for other preprocedural examination: Secondary | ICD-10-CM | POA: Insufficient documentation

## 2019-08-24 NOTE — Patient Instructions (Signed)
DUE TO COVID-19 ONLY ONE VISITOR IS ALLOWED TO COME WITH YOU AND STAY IN THE WAITING ROOM ONLY DURING PRE OP AND PROCEDURE DAY OF SURGERY. THE 1 VISITOR MAY VISIT WITH YOU AFTER SURGERY IN YOUR PRIVATE ROOM DURING VISITING HOURS ONLY!                Audery Amel    Your procedure is scheduled on: 09/04/19   Report to [facility] Main  Entrance   Report to admitting at: 1:45 PM     Call this number if you have problems the morning of surgery [phone]    Remember:   NO SOLID FOOD AFTER MIDNIGHT THE NIGHT PRIOR TO SURGERY. NOTHING BY MOUTH EXCEPT CLEAR LIQUIDS UNTIL: 12:45 pm.  CLEAR LIQUID DIET   Foods Allowed                                                                     Foods Excluded  Coffee and tea, regular and decaf                             liquids that you cannot  Plain Jell-O any favor except red or purple                                           see through such as: Fruit ices (not with fruit pulp)                                     milk, soups, orange juice  Iced Popsicles                                    All solid food Carbonated beverages, regular and diet                                    Cranberry, grape and apple juices Sports drinks like Gatorade Lightly seasoned clear broth or consume(fat free) Sugar, honey syrup  Sample Menu Breakfast                                Lunch                                     Supper Cranberry juice                    Beef broth                            Chicken broth Jell-O  Grape juice                           Apple juice Coffee or tea                        Jell-O                                      Popsicle                                                Coffee or tea                        Coffee or tea  _____________________________________________________________________    BRUSH YOUR TEETH MORNING OF SURGERY AND RINSE YOUR MOUTH OUT, NO CHEWING GUM CANDY OR  MINTS.     Take these medicines the morning of surgery with A SIP OF WATER: levetiracetam(kepra),topiramate(topomax),felbamate(felbatol)                                You may not have any metal on your body including hair pins and              piercings  Do not wear jewelry, make-up, lotions, powders or perfumes, deodorant             Do not wear nail polish on your fingernails.  Do not shave  48 hours prior to surgery.               Do not bring valuables to the hospital. [place].  Contacts, dentures or bridgework may not be worn into surgery.  Leave suitcase in the car. After surgery it may be brought to your room.     Patients discharged the day of surgery will not be allowed to drive home. IF YOU ARE HAVING SURGERY AND GOING HOME THE SAME DAY, YOU MUST HAVE AN ADULT TO DRIVE YOU HOME AND BE WITH YOU FOR 24 HOURS. YOU MAY GO HOME BY TAXI OR UBER OR ORTHERWISE, BUT AN ADULT MUST ACCOMPANY YOU HOME AND STAY WITH YOU FOR 24 HOURS.  Name and phone number of your driver:  Special Instructions: N/A              Please read over the following fact sheets you were given: _____________________________________________________________________  [facility] - Preparing for Surgery Before surgery, you can play an important role.  Because skin is not sterile, your skin needs to be as free of germs as possible.  You can reduce the number of germs on your skin by washing with CHG (chlorahexidine gluconate) soap before surgery.  CHG is an antiseptic cleaner which kills germs and bonds with the skin to continue killing germs even after washing. Please DO NOT use if you have an allergy to CHG or antibacterial soaps.  If your skin becomes reddened/irritated stop using the CHG and inform your nurse when you arrive at Short Stay. Do not shave (including legs and underarms) for at least 48  hours prior to the first CHG shower.  You may shave your  face/neck. Please follow these instructions carefully:  1.  Shower with CHG Soap the night before surgery and the  morning of Surgery.  2.  If you choose to wash your hair, wash your hair first as usual with your  normal  shampoo.  3.  After you shampoo, rinse your hair and body thoroughly to remove the  shampoo.                           4.  Use CHG as you would any other liquid soap.  You can apply chg directly  to the skin and wash                       Gently with a scrungie or clean washcloth.  5.  Apply the CHG Soap to your body ONLY FROM THE NECK DOWN.   Do not use on face/ open                           Wound or open sores. Avoid contact with eyes, ears mouth and genitals (private parts).                       Wash face,  Genitals (private parts) with your normal soap.             6.  Wash thoroughly, paying special attention to the area where your surgery  will be performed.  7.  Thoroughly rinse your body with warm water from the neck down.  8.  DO NOT shower/wash with your normal soap after using and rinsing off  the CHG Soap.                9.  Pat yourself dry with a clean towel.            10.  Wear clean pajamas.            11.  Place clean sheets on your bed the night of your first shower and do not  sleep with pets. Day of Surgery : Do not apply any lotions/deodorants the morning of surgery.  Please wear clean clothes to the hospital/surgery center.  FAILURE TO FOLLOW THESE INSTRUCTIONS MAY RESULT IN THE CANCELLATION OF YOUR SURGERY PATIENT SIGNATURE_________________________________  NURSE SIGNATURE__________________________________  ________________________________________________________________________

## 2019-08-24 NOTE — Progress Notes (Addendum)
COVID Vaccine Completed:No. Pt. Tested positive on 07/2019 Date COVID Vaccine completed: COVID vaccine manufacturer: [place]   PCP - Elon Alas NP Cardiologist - No  Chest x-ray -  EKG - 07/03/19. EPIC Stress Test -  ECHO - 06/30/19. EPIC Cardiac Cath -   Sleep Study -  CPAP -   Fasting Blood Sugar -  Checks Blood Sugar _____ times a day  Blood Thinner Instructions: Aspirin Instructions: Last Dose:  Anesthesia review: Hx: epilepsy,seizures(last episode a week ago.),admitted to hospital on 4/20 with septic shock due to pyelonephritis.  Patient denies shortness of breath, fever, cough and chest pain at PAT appointment   Patient verbalized understanding of instructions that were given to them at the PAT appointment. Patient was also instructed that they will need to review over the PAT instructions again at home before surgery.

## 2019-08-25 ENCOUNTER — Encounter (HOSPITAL_COMMUNITY)
Admission: RE | Admit: 2019-08-25 | Discharge: 2019-08-25 | Disposition: A | Discharge status: Home / Self Care | Payer: Medicare Other | Source: Ambulatory Visit | Attending: Urology | Admitting: Urology

## 2019-08-25 DIAGNOSIS — Z01818 Encounter for other preprocedural examination: Secondary | ICD-10-CM | POA: Diagnosis not present

## 2019-08-25 LAB — BASIC METABOLIC PANEL
Anion gap: 8 (ref 5–15)
BUN: 14 mg/dL (ref 8–23)
CO2: 23 mmol/L (ref 22–32)
Calcium: 9 mg/dL (ref 8.9–10.3)
Chloride: 109 mmol/L (ref 98–111)
Creatinine, Ser: 0.81 mg/dL (ref 0.44–1.00)
GFR calc Af Amer: >60 mL/min (ref >60)
GFR calc non Af Amer: >60 mL/min (ref >60)
Glucose, Bld: 100 mg/dL — ABNORMAL HIGH (ref 70–99)
Potassium: 3.5 mmol/L (ref 3.5–5.1)
Sodium: 140 mmol/L (ref 135–145)

## 2019-08-25 LAB — CBC
HCT: 36.9 % (ref 36.0–46.0)
Hemoglobin: 11.7 g/dL — ABNORMAL LOW (ref 12.0–15.0)
MCH: 30.4 pg (ref 26.0–34.0)
MCHC: 31.7 g/dL (ref 30.0–36.0)
MCV: 95.8 fL (ref 80.0–100.0)
Platelets: 380 10*3/uL (ref 150–400)
RBC: 3.85 MIL/uL — ABNORMAL LOW (ref 3.87–5.11)
RDW: 12.8 % (ref 11.5–15.5)
WBC: 8.6 10*3/uL (ref 4.0–10.5)
nRBC: 0 % (ref 0.0–0.2)

## 2019-09-01 NOTE — H&P (Signed)
Office Visit Report     08/11/2019   --------------------------------------------------------------------------------   Amber Bowman  MRN: [id number]  DOB: [birth year], 64 year old Female  SSN:    PRIMARY CARE:    REFERRING:    PROVIDER:  Raynelle Bring, M.D.  LOCATION:  [facility] [phone]     --------------------------------------------------------------------------------   CC/HPI: Left ureteral calculus   Amber Bowman is a 64 year old female who was initially seen as a hospital consultation in late April when she presented with sepsis and an obstructing proximal left ureteral stone. She underwent left nephrostomy tube placement and was treated with antibiotics. Her hospitalization was complicated by multi system organ failure requiring hemodialysis. She also was noted to be Covid positive upon her admission and developed pulmonary complications related to this. She eventually did recover and was able to stop dialysis. She follows up today for further evaluation. Currently, her performance status is significantly improved. She is feeling relatively good and has been walking on a daily basis. She still has her indwelling nephrostomy tube.     ALLERGIES: None   MEDICATIONS: Aspirin 81 mg tablet,chewable  Felbamate  J-Cap Eye Vitamin  Levetiracetam  Topiramate  Vitamin D3     GU PSH: None   NON-GU PSH: None   GU PMH: None     PMH Notes:   1) Urolithiasis: She presented in April 2021 with a left ureteral stone and sepsis. She was diagnosed with COVID and had a left PCN placed. She had a prolonged hospital course.    NON-GU PMH: Depression Seizure disorder    FAMILY HISTORY: Diabetes - Runs in Family Heart Attack - Father, Mother Hypertension - Mother, Father, Runs in Family    Notes: 0 children   SOCIAL HISTORY: Marital Status: Married Preferred Language: English; Ethnicity: Not Hispanic Or Latino; Race: White Current Smoking Status: Patient has  never smoked.   Tobacco Use Assessment Completed: Used Tobacco in last 30 days? Has never drank.  Drinks 4+ caffeinated drinks per day.    REVIEW OF SYSTEMS:    GU Review Female:   Patient reports frequent urination, get up at night to urinate, and leakage of urine. Patient denies hard to postpone urination, burning /pain with urination, stream starts and stops, trouble starting your stream, have to strain to urinate, and currently pregnant.  Gastrointestinal (Lower):   Patient denies diarrhea and constipation.  Gastrointestinal (Upper):   Patient denies nausea and vomiting.  Constitutional:   Patient denies fever, night sweats, weight loss, and fatigue.  Skin:   Patient denies skin rash/ lesion and itching.  Eyes:   Patient reports double vision. Patient denies blurred vision.  Ears/ Nose/ Throat:   Patient denies sore throat and sinus problems.  Hematologic/Lymphatic:   Patient denies swollen glands and easy bruising.  Cardiovascular:   Patient denies leg swelling and chest pains.  Respiratory:   Patient denies cough and shortness of breath.  Endocrine:   Patient reports excessive thirst.   Musculoskeletal:   Patient reports back pain. Patient denies joint pain.  Neurological:   Patient denies headaches and dizziness.  Psychologic:   Patient denies depression and anxiety.   VITAL SIGNS:      08/11/2019 02:43 PM  Weight 150 lb / 68.04 kg  Height 64 in / 162.56 cm  BP 111/66 mmHg  Pulse 97 /min  Temperature 97.3 F / 36.2 C  BMI 25.7 kg/m   MULTI-SYSTEM PHYSICAL EXAMINATION:    Constitutional: Well-nourished. No physical deformities.  Normally developed. Good grooming.  Respiratory: No labored breathing, no use of accessory muscles. Clear bilaterally.  Cardiovascular: Normal temperature, normal extremity pulses, no swelling, no varicosities. Regular rate and rhythm.  Gastrointestinal: No mass, no tenderness, no rigidity, non obese abdomen. Her indwelling left nephrostomy tube is  draining grossly clear urine.     Complexity of Data:  Records Review:   Previous Patient Records  X-Ray Review: KUB: Reviewed Films.    Notes:                     I independently reviewed her KUB x-ray. This demonstrates a persistent 9-10 mm calcification at the left ureteropelvic junction. There is an additional calcification located near the upper pole of the right kidney likely representing the additional stone that was seen on her prior CT imaging.   PROCEDURES:         KUB - [id number]  A single view of the abdomen is obtained.      Patient confirmed No Neulasta OnPro Device.            Urinalysis Dipstick Dipstick Cont'd  Color: Yellow Bilirubin: Neg mg/dL  Appearance: Clear Ketones: Neg mg/dL  Specific Gravity: 1.025 Blood: Neg ery/uL  pH: 5.5 Protein: Neg mg/dL  Glucose: Neg mg/dL Urobilinogen: 0.2 mg/dL    Nitrites: Neg    Leukocyte Esterase: Neg leu/uL    ASSESSMENT:      ICD-10 Details  1 GU:   Ureteral calculus - N20.1    PLAN:           Orders Labs Urine Culture, BMP  Lab Notes: FROM NEPHROSTOMY          Schedule Return Visit/Planned Activity: Other See Visit Notes             Note: Will call to schedule surgery          Document Letter(s):  Created for Patient: Clinical Summary         Notes:   1. Left ureteral and renal calculi: Her urine will be cultured from her nephrostomy tube today. Her renal function will be rechecked to assess her new baseline. Her most recent creatinine was 1.9 approximately 1 month ago. We have reviewed options including percutaneous nephrolithotomy, ureteroscopic laser lithotripsy, and shockwave lithotripsy. After reviewing the pros and cons of each approach, she is most interested in ureteroscopic laser lithotripsy. She will be scheduled for cystoscopy with left retrograde pyelogram, left ureteroscopy with laser lithotripsy and stone removal and we will also examine her left renal collecting system remove any additional stones  that are identified. Her nephrostomy tube can likely be removed at the time of her upcoming procedure with plans to place a ureteral stent.   Cc:    * Signed by Raynelle Bring, M.D. on 08/11/19 at 8:35 PM (EDT)*

## 2019-09-04 ENCOUNTER — Encounter (HOSPITAL_COMMUNITY)
Admission: RE | Disposition: A | Discharge status: Home / Self Care | Payer: Self-pay | Source: Home / Self Care | Attending: Urology

## 2019-09-04 ENCOUNTER — Ambulatory Visit (HOSPITAL_COMMUNITY): Payer: Medicare Other

## 2019-09-04 ENCOUNTER — Ambulatory Visit (HOSPITAL_COMMUNITY): Payer: Medicare Other | Admitting: Physician Assistant

## 2019-09-04 ENCOUNTER — Ambulatory Visit (HOSPITAL_COMMUNITY)
Admission: RE | Admit: 2019-09-04 | Discharge: 2019-09-04 | Disposition: A | Discharge status: Home / Self Care | Payer: Medicare Other | Attending: Urology | Admitting: Urology

## 2019-09-04 ENCOUNTER — Encounter (HOSPITAL_COMMUNITY): Payer: Self-pay | Admitting: Urology

## 2019-09-04 DIAGNOSIS — Z79899 Other long term (current) drug therapy: Secondary | ICD-10-CM | POA: Diagnosis not present

## 2019-09-04 DIAGNOSIS — G40909 Epilepsy, unspecified, not intractable, without status epilepticus: Secondary | ICD-10-CM | POA: Diagnosis not present

## 2019-09-04 DIAGNOSIS — Z7982 Long term (current) use of aspirin: Secondary | ICD-10-CM | POA: Insufficient documentation

## 2019-09-04 DIAGNOSIS — F329 Major depressive disorder, single episode, unspecified: Secondary | ICD-10-CM | POA: Insufficient documentation

## 2019-09-04 DIAGNOSIS — Z833 Family history of diabetes mellitus: Secondary | ICD-10-CM | POA: Diagnosis not present

## 2019-09-04 DIAGNOSIS — N201 Calculus of ureter: Secondary | ICD-10-CM | POA: Diagnosis not present

## 2019-09-04 DIAGNOSIS — Z8616 Personal history of COVID-19: Secondary | ICD-10-CM | POA: Insufficient documentation

## 2019-09-04 DIAGNOSIS — Z8249 Family history of ischemic heart disease and other diseases of the circulatory system: Secondary | ICD-10-CM | POA: Diagnosis not present

## 2019-09-04 HISTORY — PX: CYSTOSCOPY/URETEROSCOPY/HOLMIUM LASER/STENT PLACEMENT: SHX6546

## 2019-09-04 SURGERY — CYSTOSCOPY/URETEROSCOPY/HOLMIUM LASER/STENT PLACEMENT
Anesthesia: General | Laterality: Left

## 2019-09-04 MED ORDER — FENTANYL CITRATE (PF) 100 MCG/2ML IJ SOLN
INTRAMUSCULAR | Status: DC | PRN
  Administered 2019-09-04 (×3): 50 ug via INTRAVENOUS

## 2019-09-04 MED ORDER — PROMETHAZINE HCL 25 MG/ML IJ SOLN: 6.2500 mg | INTRAMUSCULAR | Status: DC | PRN

## 2019-09-04 MED ORDER — FENTANYL CITRATE (PF) 100 MCG/2ML IJ SOLN
INTRAMUSCULAR | Status: AC
  Filled 2019-09-04: qty 2

## 2019-09-04 MED ORDER — SODIUM CHLORIDE 0.9 % IR SOLN
Status: DC | PRN
  Administered 2019-09-04: 3000 mL

## 2019-09-04 MED ORDER — PROPOFOL 10 MG/ML IV BOLUS
INTRAVENOUS | Status: DC | PRN
  Administered 2019-09-04: 140 mg via INTRAVENOUS

## 2019-09-04 MED ORDER — IOHEXOL 300 MG/ML  SOLN
INTRAMUSCULAR | Status: DC | PRN
  Administered 2019-09-04: 5 mL

## 2019-09-04 MED ORDER — MIDAZOLAM HCL 5 MG/5ML IJ SOLN
INTRAMUSCULAR | Status: DC | PRN
  Administered 2019-09-04: 2 mg via INTRAVENOUS

## 2019-09-04 MED ORDER — CHLORHEXIDINE GLUCONATE 0.12 % MT SOLN
15.0000 mL | Freq: Once | OROMUCOSAL | Status: AC
  Administered 2019-09-04: 15 mL via OROMUCOSAL

## 2019-09-04 MED ORDER — OXYCODONE HCL 5 MG/5ML PO SOLN: 5.0000 mg | Freq: Once | ORAL | Status: DC | PRN

## 2019-09-04 MED ORDER — FENTANYL CITRATE (PF) 100 MCG/2ML IJ SOLN: 25.0000 ug | INTRAMUSCULAR | Status: DC | PRN

## 2019-09-04 MED ORDER — ORAL CARE MOUTH RINSE: 15.0000 mL | Freq: Once | OROMUCOSAL | Status: AC

## 2019-09-04 MED ORDER — ONDANSETRON HCL 4 MG/2ML IJ SOLN
INTRAMUSCULAR | Status: DC | PRN
  Administered 2019-09-04: 4 mg via INTRAVENOUS

## 2019-09-04 MED ORDER — DEXAMETHASONE SODIUM PHOSPHATE 10 MG/ML IJ SOLN
INTRAMUSCULAR | Status: DC | PRN
  Administered 2019-09-04: 5 mg via INTRAVENOUS

## 2019-09-04 MED ORDER — OXYCODONE HCL 5 MG PO TABS: 5.0000 mg | ORAL_TABLET | Freq: Once | ORAL | Status: DC | PRN

## 2019-09-04 MED ORDER — TRAMADOL HCL 50 MG PO TABS: 50.0000 mg | ORAL_TABLET | Freq: Four times a day (QID) | ORAL | 0 refills | Status: DC | PRN

## 2019-09-04 MED ORDER — LACTATED RINGERS IV SOLN: INTRAVENOUS | Status: DC | PRN

## 2019-09-04 MED ORDER — LIDOCAINE 2% (20 MG/ML) 5 ML SYRINGE
INTRAMUSCULAR | Status: DC | PRN
  Administered 2019-09-04: 80 mg via INTRAVENOUS

## 2019-09-04 MED ORDER — MIDAZOLAM HCL 2 MG/2ML IJ SOLN
INTRAMUSCULAR | Status: AC
  Filled 2019-09-04: qty 2

## 2019-09-04 MED ORDER — ACETAMINOPHEN 500 MG PO TABS: 1000.0000 mg | ORAL_TABLET | Freq: Once | ORAL | Status: DC

## 2019-09-04 MED ORDER — SODIUM CHLORIDE 0.9 % IV SOLN
2.0000 g | Freq: Once | INTRAVENOUS | Status: AC
  Administered 2019-09-04: 2 g via INTRAVENOUS
  Filled 2019-09-04: qty 20

## 2019-09-04 SURGICAL SUPPLY — 24 items
BAG URO CATCHER STRL LF (MISCELLANEOUS) ×2 IMPLANT
BASKET ZERO TIP NITINOL 2.4FR (BASKET) ×1 IMPLANT
BSKT STON RTRVL ZERO TP 2.4FR (BASKET) ×1
CATH INTERMIT  6FR 70CM (CATHETERS) ×1 IMPLANT
CLOTH BEACON ORANGE TIMEOUT ST (SAFETY) ×2 IMPLANT
DRSG PAD ABDOMINAL 8X10 ST (GAUZE/BANDAGES/DRESSINGS) ×1 IMPLANT
EXTRACTOR STONE NITINOL NGAGE (UROLOGICAL SUPPLIES) ×1 IMPLANT
FIBER LASER FLEXIVA 365 (UROLOGICAL SUPPLIES) ×1 IMPLANT
FIBER LASER TRAC TIP (UROLOGICAL SUPPLIES) IMPLANT
GAUZE SPONGE 4X4 12PLY STRL (GAUZE/BANDAGES/DRESSINGS) ×1 IMPLANT
GLOVE BIOGEL M STRL SZ7.5 (GLOVE) ×2 IMPLANT
GOWN STRL REUS W/TWL LRG LVL3 (GOWN DISPOSABLE) ×4 IMPLANT
GUIDEWIRE ANG ZIPWIRE 038X150 (WIRE) IMPLANT
GUIDEWIRE STR DUAL SENSOR (WIRE) ×2 IMPLANT
IV NS 1000ML (IV SOLUTION) ×2
IV NS 1000ML BAXH (IV SOLUTION) ×1 IMPLANT
KIT TURNOVER KIT A (KITS) IMPLANT
MANIFOLD NEPTUNE II (INSTRUMENTS) ×2 IMPLANT
PACK CYSTO (CUSTOM PROCEDURE TRAY) ×2 IMPLANT
SHEATH URETERAL 12FRX35CM (MISCELLANEOUS) ×1 IMPLANT
STENT URET 6FRX24 CONTOUR (STENTS) ×1 IMPLANT
TAPE CLOTH SURG 4X10 WHT LF (GAUZE/BANDAGES/DRESSINGS) ×1 IMPLANT
TUBING CONNECTING 10 (TUBING) ×2 IMPLANT
TUBING UROLOGY SET (TUBING) ×2 IMPLANT

## 2019-09-04 NOTE — Discharge Instructions (Signed)

## 2019-09-04 NOTE — Anesthesia Preprocedure Evaluation (Addendum)
Anesthesia Evaluation  Patient identified by MRN, date of birth, ID band Patient awake    Reviewed: Allergy & Precautions, NPO status , Patient's Chart, lab work & pertinent test results  History of Anesthesia Complications Negative for: history of anesthetic complications  Airway Mallampati: II  TM Distance: >3 FB Neck ROM: Full    Dental  (+) Edentulous Upper, Missing,    Pulmonary neg pulmonary ROS,    Pulmonary exam normal        Cardiovascular +CHF  Normal cardiovascular exam  TTE : diffuse hypokinesis with abnormal septal motion, EF 40-45%, mildly elevated PASP, mild RAE, moderate TR   Neuro/Psych Seizures -, Well Controlled,  Depression    GI/Hepatic negative GI ROS, Neg liver ROS,   Endo/Other  negative endocrine ROS  Renal/GU LEFT RENAL AND URETERAL CALCULI  negative genitourinary   Musculoskeletal negative musculoskeletal ROS (+)   Abdominal   Peds  Hematology negative hematology ROS (+)   Anesthesia Other Findings Day of surgery medications reviewed with patient.  Reproductive/Obstetrics negative OB ROS                            Anesthesia Physical Anesthesia Plan  ASA: III  Anesthesia Plan: General   Post-op Pain Management:    Induction: Intravenous  PONV Risk Score and Plan: 3 and Treatment may vary due to age or medical condition, Ondansetron, Dexamethasone and Midazolam  Airway Management Planned: LMA  Additional Equipment: None  Intra-op Plan:   Post-operative Plan: Extubation in OR  Informed Consent: I have reviewed the patients History and Physical, chart, labs and discussed the procedure including the risks, benefits and alternatives for the proposed anesthesia with the patient or authorized representative who has indicated his/her understanding and acceptance.     Dental advisory given  Plan Discussed with: CRNA  Anesthesia Plan Comments:         Anesthesia Quick Evaluation

## 2019-09-04 NOTE — OR Nursing (Signed)
Stone taken by Dr. Borden 

## 2019-09-04 NOTE — Anesthesia Procedure Notes (Signed)
Procedure Name: LMA Insertion Performed by: Gean Maidens, CRNA Pre-anesthesia Checklist: Patient identified, Emergency Drugs available, Suction available, Patient being monitored and Timeout performed Patient Re-evaluated:Patient Re-evaluated prior to induction Oxygen Delivery Method: Circle system utilized Preoxygenation: Pre-oxygenation with 100% oxygen Induction Type: IV induction LMA: LMA inserted LMA Size: 4.0 Number of attempts: 1 Placement Confirmation: positive ETCO2 and breath sounds checked- equal and bilateral Tube secured with: Tape Dental Injury: Teeth and Oropharynx as per pre-operative assessment

## 2019-09-04 NOTE — Op Note (Signed)
Preoperative diagnosis: Left ureteral calculus  Postoperative diagnosis: Left ureteral calculus  Procedure: 1.  Cystoscopy 2.  Left retrograde pyelography with interpretation 3.  Left ureteroscopy with laser lithotripsy and stone removal 4.  Left ureteral stent placement (6 x 24 -no string) 5.  Removal of left nephrostomy tube  Surgeon: Pryor Curia MD  Anesthesia: General  Complications: None  EBL: Minimal  Specimen: Left ureteral stone  Disposition of specimen: [facility] for analysis  Intraoperative findings: Left retrograde pyelography was performed with a 6 French ureteral catheter and Omnipaque contrast.  This revealed a filling defect at the level of the ureteropelvic junction.  Above this level, her indwelling nephrostomy tube was seen curled in the renal pelvis without evidence of hydronephrosis.  Indication: This is a 64 year old female who recently presented with sepsis and a left ureteral stone.  She underwent left nephrostomy tube placement and had an extended hospital stay due to severe illness.  Following recovery, she presented for further evaluation and definitive treatment of her left UPJ calculus.  After reviewing options, she elected to proceed with the above procedures.  The potential risks, complications, and the expected recovery process was discussed in detail.  Informed consent was obtained.  Description of procedure: The patient was taken to the operating room and a general anesthetic was administered.  She was given preoperative antibiotics, placed in the dorsolithotomy position, and prepped and draped in the usual sterile fashion.  Next, a preoperative timeout was performed.  Cystourethroscopy was performed revealing no bladder tumors, stones, or other mucosal abnormalities.  The ureteral orifice ease were in their expected anatomic location.  Attention then turned to the left ureteral orifice and a 6 French ureteral catheter was  inserted.  Omnipaque contrast was injected with findings as dictated above.  A filling defect in the proximal left ureter was consistent with her known stone.  A 0.38 sensor guidewire was advanced up and was able to be advanced past the stone into the renal pelvis.  A 6 French semirigid ureteroscope was then used and ureteroscopy was performed.  I was able to get up to the proximal ureter where the stone was visualized.  A 365 m holmium laser fiber was then used to perform laser lithotripsy on a setting of 0.6 J and 6 Hz.  Unfortunately, the stone migrated proximally into the renal collecting system.  I therefore removed the semirigid ureteroscope and placed a 12/14 ureteral access sheath.  I then used the flexible digital ureteroscope and advanced it into the renal pelvis.  Additional laser lithotripsy was performed to fragment the stone on a setting of 0.2 J and 50 Hz resulting in a dusting technique.  The remaining sizable fragments were removed with a 0 tip nitinol basket.  All remaining stone fragments were dusted until they were 2 mm or smaller.  The patient's nephrostomy tube was removed under direct vision.  The wire was then left in place and the ureteral access sheath and flexible ureteroscope were removed.  The wire was then backloaded on the cystoscope and a 6 x 24 double-J ureteral stent was advanced over the wire using Seldinger technique.  This was positioned appropriately under fluoroscopic and cystoscopic guidance.  A good curl was noted in the renal pelvis as well as with the bladder after the wire was removed.  The patient's bladder was emptied and the procedure was ended.  He tolerated procedure well without complications.  She was able to be awakened and transferred to recovery  unit in satisfactory condition.

## 2019-09-04 NOTE — Interval H&P Note (Signed)
History and Physical Interval Note:  09/04/2019 2:43 PM  Amber Bowman  has presented today for surgery, with the diagnosis of LEFT RENAL AND URETERAL CALCULI.  The various methods of treatment have been discussed with the patient and family. After consideration of risks, benefits and other options for treatment, the patient has consented to  Procedure(s): CYSTOSCOPY/RETROGRADE/ URETEROSCOPY/HOLMIUM LASER/STENT PLACEMENT/ REMOVAL OF NEPHROSTOMY TUBE (Left) as a surgical intervention.  The patient's history has been reviewed, patient examined, no change in status, stable for surgery.  I have reviewed the patient's chart and labs.  Questions were answered to the patient's satisfaction.     [employer]

## 2019-09-04 NOTE — Transfer of Care (Signed)
Immediate Anesthesia Transfer of Care Note  Patient: Amber Bowman  Procedure(s) Performed: CYSTOSCOPY/RETROGRADE/ URETEROSCOPY/HOLMIUM LASER/STENT PLACEMENT/ REMOVAL OF NEPHROSTOMY TUBE (Left )  Patient Location: PACU  Anesthesia Type:General  Level of Consciousness: awake, alert  and oriented  Airway & Oxygen Therapy: Patient Spontanous Breathing and Patient connected to face mask oxygen  Post-op Assessment: Report given to RN and Post -op Vital signs reviewed and stable  Post vital signs: Reviewed and stable  Last Vitals:  Vitals Value Taken Time  BP 129/70 09/04/19 1653  Temp 36.5 C 09/04/19 1653  Pulse 68 09/04/19 1655  Resp 15 09/04/19 1655  SpO2 98 % 09/04/19 1655  Vitals shown include unvalidated device data.  Last Pain:  Vitals:   09/04/19 1411  TempSrc:   PainSc: 0-No pain         Complications: No complications documented.

## 2019-09-05 ENCOUNTER — Encounter (HOSPITAL_COMMUNITY): Payer: Self-pay | Admitting: Urology

## 2019-09-05 NOTE — Anesthesia Postprocedure Evaluation (Signed)
Anesthesia Post Note  Patient: Amber Bowman  Procedure(s) Performed: CYSTOSCOPY/RETROGRADE/ URETEROSCOPY/HOLMIUM LASER/STENT PLACEMENT/ REMOVAL OF NEPHROSTOMY TUBE (Left )     Patient location during evaluation: PACU Anesthesia Type: General Level of consciousness: awake and alert and oriented Pain management: pain level controlled Vital Signs Assessment: post-procedure vital signs reviewed and stable Respiratory status: spontaneous breathing, nonlabored ventilation and respiratory function stable Cardiovascular status: blood pressure returned to baseline Postop Assessment: no apparent nausea or vomiting Anesthetic complications: no   No complications documented.        Brennan Bailey

## 2019-09-19 ENCOUNTER — Other Ambulatory Visit: Payer: Self-pay | Admitting: Nurse Practitioner

## 2019-09-19 DIAGNOSIS — Z1231 Encounter for screening mammogram for malignant neoplasm of breast: Secondary | ICD-10-CM

## 2019-09-28 ENCOUNTER — Other Ambulatory Visit (HOSPITAL_COMMUNITY): Payer: Medicare Other

## 2019-09-29 ENCOUNTER — Ambulatory Visit (HOSPITAL_COMMUNITY): Payer: Medicare Other

## 2019-10-03 DIAGNOSIS — N201 Calculus of ureter: Secondary | ICD-10-CM | POA: Diagnosis not present

## 2019-10-04 ENCOUNTER — Other Ambulatory Visit: Payer: Self-pay | Admitting: Adult Health

## 2019-10-04 NOTE — Telephone Encounter (Signed)
[name], Pt Amber Bowman's significant other came in to have a consult with Jinny Blossom. The Pt was admitted to the hospital with a bad infection and the original medication prescribed by [facility] Felbamate 600mg  2 tabs 4x a day was reduced by DrAmrit Adhikari to 1/2 tab twice a day and she is doing much better. Billy her significant other was looking to have a consult and see if Jinny Blossom would be able to lower the dosage for the refill. Please give him a call to discuss. I tried to give as much detail as he provided. Thank you

## 2019-10-04 NOTE — Telephone Encounter (Signed)
I called and spoke to pts Sign. Other.  Pt was in hospital ([facility]) for sepsis 07-11-19 and her felbamate was decreased to 300mg  po bid the other sz medications are same.  She is doing well. Has enough to get by until next week to address with providers.  I will send to Dr. Jannifer Franklin and Jinny Blossom NP to address.

## 2019-10-05 MED ORDER — FELBAMATE 600 MG PO TABS: 300.0000 mg | ORAL_TABLET | Freq: Two times a day (BID) | ORAL | 1 refills | Status: DC

## 2019-10-05 NOTE — Telephone Encounter (Signed)
On 27 June 2019, the patient was admitted to the hospital with sepsis related to E. coli secondary to pyelonephritis.  The discharge summary does not make it clear why the felbamate was reduced, the liver enzymes were never significantly elevated.  I suppose we will continue the current dose of the felbamate if the patient is doing well, but I would not hesitate to increase the medication again if needed.

## 2019-10-09 DIAGNOSIS — G40219 Localization-related (focal) (partial) symptomatic epilepsy and epileptic syndromes with complex partial seizures, intractable, without status epilepticus: Secondary | ICD-10-CM | POA: Diagnosis not present

## 2019-10-09 DIAGNOSIS — G40909 Epilepsy, unspecified, not intractable, without status epilepticus: Secondary | ICD-10-CM | POA: Diagnosis not present

## 2019-10-12 DIAGNOSIS — Z23 Encounter for immunization: Secondary | ICD-10-CM | POA: Diagnosis not present

## 2019-10-13 ENCOUNTER — Other Ambulatory Visit: Payer: Self-pay

## 2019-10-13 ENCOUNTER — Ambulatory Visit
Admission: RE | Admit: 2019-10-13 | Discharge: 2019-10-13 | Disposition: A | Discharge status: Home / Self Care | Payer: Medicare Other | Source: Ambulatory Visit | Attending: Nurse Practitioner | Admitting: Nurse Practitioner

## 2019-10-13 DIAGNOSIS — Z1231 Encounter for screening mammogram for malignant neoplasm of breast: Secondary | ICD-10-CM | POA: Diagnosis not present

## 2019-10-16 DIAGNOSIS — E785 Hyperlipidemia, unspecified: Secondary | ICD-10-CM | POA: Diagnosis not present

## 2019-10-20 DIAGNOSIS — I5022 Chronic systolic (congestive) heart failure: Secondary | ICD-10-CM | POA: Diagnosis not present

## 2019-10-20 DIAGNOSIS — G40909 Epilepsy, unspecified, not intractable, without status epilepticus: Secondary | ICD-10-CM | POA: Diagnosis not present

## 2019-10-20 DIAGNOSIS — E785 Hyperlipidemia, unspecified: Secondary | ICD-10-CM | POA: Diagnosis not present

## 2019-10-20 DIAGNOSIS — D649 Anemia, unspecified: Secondary | ICD-10-CM | POA: Diagnosis not present

## 2019-10-20 DIAGNOSIS — D696 Thrombocytopenia, unspecified: Secondary | ICD-10-CM | POA: Diagnosis not present

## 2019-11-03 DIAGNOSIS — Z23 Encounter for immunization: Secondary | ICD-10-CM | POA: Diagnosis not present

## 2019-11-08 DIAGNOSIS — R9401 Abnormal electroencephalogram [EEG]: Secondary | ICD-10-CM | POA: Diagnosis not present

## 2019-11-08 DIAGNOSIS — G40219 Localization-related (focal) (partial) symptomatic epilepsy and epileptic syndromes with complex partial seizures, intractable, without status epilepticus: Secondary | ICD-10-CM | POA: Diagnosis not present

## 2019-11-17 DIAGNOSIS — R569 Unspecified convulsions: Secondary | ICD-10-CM | POA: Diagnosis not present

## 2019-11-17 DIAGNOSIS — I6782 Cerebral ischemia: Secondary | ICD-10-CM | POA: Diagnosis not present

## 2019-11-17 DIAGNOSIS — G40219 Localization-related (focal) (partial) symptomatic epilepsy and epileptic syndromes with complex partial seizures, intractable, without status epilepticus: Secondary | ICD-10-CM | POA: Diagnosis not present

## 2019-11-21 DIAGNOSIS — R9430 Abnormal result of cardiovascular function study, unspecified: Secondary | ICD-10-CM | POA: Diagnosis not present

## 2019-11-21 DIAGNOSIS — Z8616 Personal history of COVID-19: Secondary | ICD-10-CM | POA: Diagnosis not present

## 2019-11-21 DIAGNOSIS — R0600 Dyspnea, unspecified: Secondary | ICD-10-CM | POA: Diagnosis not present

## 2019-11-30 DIAGNOSIS — E559 Vitamin D deficiency, unspecified: Secondary | ICD-10-CM | POA: Diagnosis not present

## 2019-11-30 DIAGNOSIS — Z862 Personal history of diseases of the blood and blood-forming organs and certain disorders involving the immune mechanism: Secondary | ICD-10-CM | POA: Diagnosis not present

## 2019-12-14 ENCOUNTER — Other Ambulatory Visit: Payer: Self-pay | Admitting: Adult Health

## 2019-12-28 DIAGNOSIS — R9431 Abnormal electrocardiogram [ECG] [EKG]: Secondary | ICD-10-CM | POA: Diagnosis not present

## 2019-12-28 DIAGNOSIS — I429 Cardiomyopathy, unspecified: Secondary | ICD-10-CM | POA: Diagnosis not present

## 2020-01-17 DIAGNOSIS — G40219 Localization-related (focal) (partial) symptomatic epilepsy and epileptic syndromes with complex partial seizures, intractable, without status epilepticus: Secondary | ICD-10-CM | POA: Diagnosis not present

## 2020-01-26 DIAGNOSIS — I429 Cardiomyopathy, unspecified: Secondary | ICD-10-CM | POA: Diagnosis not present

## 2020-01-26 DIAGNOSIS — R9431 Abnormal electrocardiogram [ECG] [EKG]: Secondary | ICD-10-CM | POA: Diagnosis not present

## 2020-01-31 DIAGNOSIS — N201 Calculus of ureter: Secondary | ICD-10-CM | POA: Diagnosis not present

## 2020-03-12 ENCOUNTER — Other Ambulatory Visit: Payer: Self-pay

## 2020-03-12 ENCOUNTER — Ambulatory Visit (INDEPENDENT_AMBULATORY_CARE_PROVIDER_SITE_OTHER): Payer: Medicare HMO | Admitting: Nurse Practitioner

## 2020-03-12 ENCOUNTER — Encounter: Payer: Self-pay | Admitting: Nurse Practitioner

## 2020-03-12 VITALS — BP 126/78 | Ht 62.0 in | Wt 158.0 lb

## 2020-03-12 DIAGNOSIS — Z01419 Encounter for gynecological examination (general) (routine) without abnormal findings: Secondary | ICD-10-CM | POA: Diagnosis not present

## 2020-03-12 DIAGNOSIS — M8589 Other specified disorders of bone density and structure, multiple sites: Secondary | ICD-10-CM

## 2020-03-12 DIAGNOSIS — Z780 Asymptomatic menopausal state: Secondary | ICD-10-CM

## 2020-03-12 NOTE — Patient Instructions (Signed)

## 2020-03-12 NOTE — Progress Notes (Signed)
   Amber Bowman 07-Mar-1956 [id number]   History:  65 y.o. G0 presents for breast and pelvic exam without GYN complaints. Postmenopausal - no HRT, no bleeding. Normal pap and mammogram history. Osteopenia.   Gynecologic History Patient's last menstrual period was 02/11/2008.   Contraception: post menopausal status Last Pap: 2016. Results were: normal Last mammogram: 10/15/2019. Results were: normal Last colonoscopy: 2014. Results were: normal  Last Dexa: 04/14/2018. Results were: t-score -1.8, 9.5% / 1.1%  Past medical history, past surgical history, family history and social history were all reviewed and documented in the EPIC chart.  ROS:  A ROS was performed and pertinent positives and negatives are included.  Exam:  Vitals:   03/12/20 1006  BP: 126/78  Weight: 158 lb (71.7 kg)  Height: 5\' 2"  (1.575 m)   Body mass index is 28.9 kg/m.  General appearance:  Normal Thyroid:  Symmetrical, normal in size, without palpable masses or nodularity. Respiratory  Auscultation:  Clear without wheezing or rhonchi Cardiovascular  Auscultation:  Regular rate, without rubs, murmurs or gallops  Edema/varicosities:  Not grossly evident Abdominal  Soft,nontender, without masses, guarding or rebound.  Liver/spleen:  No organomegaly noted  Hernia:  None appreciated  Skin  Inspection:  Grossly normal   Breasts: Examined lying and sitting.   Right: Without masses, retractions, discharge or axillary adenopathy.   Left: Without masses, retractions, discharge or axillary adenopathy. Gentitourinary   Inguinal/mons:  Normal without inguinal adenopathy  External genitalia:  Normal  BUS/Urethra/Skene's glands:  Normal  Vagina:  Atrophic changes  Cervix:  Normal  Uterus:  Normal in size, shape and contour.  Midline and mobile  Adnexa/parametria:     Rt: Without masses or tenderness.   Lt: Without masses or tenderness.  Anus and perineum: Normal  Digital rectal exam: Normal sphincter tone  without palpated masses or tenderness  Assessment/Plan:  65 y.o. G0 for breast and pelvic exam.   Well female exam with routine gynecological exam - Education provided on SBEs, importance of preventative screenings, current guidelines, high calcium diet, regular exercise, and multivitamin daily. Labs with PCP.   Osteopenia of multiple sites - Plan: DG Bone Density. Taking daily Vitamin D supplement and exercising regularly.   Postmenopausal - Plan: DG Bone Density  Screening for cervical cancer - Normal Pap history. Pap with HR HPV today. If normal, we both agree to stop screening per guidelines.   Screening for breast cancer - Normal mammogram history.  Continue annual screenings.  Normal breast exam today.  Screening for colon cancer - 2014 colonoscopy. Will repeat at GI's recommended interval.   Follow up in 1 year for annual.     [facility], 10:24 AM 03/12/2020

## 2020-03-12 NOTE — Addendum Note (Signed)
Addended by: Aura Camps on: 03/12/2020 10:48 AM   Modules accepted: Orders

## 2020-03-13 LAB — PAP, TP IMAGING W/ HPV RNA, RFLX HPV TYPE 16,18/45: HPV DNA High Risk: NOT DETECTED

## 2020-04-30 ENCOUNTER — Ambulatory Visit (INDEPENDENT_AMBULATORY_CARE_PROVIDER_SITE_OTHER): Payer: Medicare HMO

## 2020-04-30 ENCOUNTER — Other Ambulatory Visit: Payer: Self-pay | Admitting: Nurse Practitioner

## 2020-04-30 ENCOUNTER — Other Ambulatory Visit: Payer: Self-pay

## 2020-04-30 DIAGNOSIS — M8589 Other specified disorders of bone density and structure, multiple sites: Secondary | ICD-10-CM | POA: Diagnosis not present

## 2020-04-30 DIAGNOSIS — Z780 Asymptomatic menopausal state: Secondary | ICD-10-CM

## 2020-05-23 DIAGNOSIS — Z7982 Long term (current) use of aspirin: Secondary | ICD-10-CM | POA: Diagnosis not present

## 2020-05-23 DIAGNOSIS — Z8249 Family history of ischemic heart disease and other diseases of the circulatory system: Secondary | ICD-10-CM | POA: Diagnosis not present

## 2020-05-23 DIAGNOSIS — Z833 Family history of diabetes mellitus: Secondary | ICD-10-CM | POA: Diagnosis not present

## 2020-05-23 DIAGNOSIS — R030 Elevated blood-pressure reading, without diagnosis of hypertension: Secondary | ICD-10-CM | POA: Diagnosis not present

## 2020-05-23 DIAGNOSIS — G40909 Epilepsy, unspecified, not intractable, without status epilepticus: Secondary | ICD-10-CM | POA: Diagnosis not present

## 2020-05-23 DIAGNOSIS — Z008 Encounter for other general examination: Secondary | ICD-10-CM | POA: Diagnosis not present

## 2020-06-07 ENCOUNTER — Other Ambulatory Visit: Payer: Self-pay | Admitting: Obstetrics & Gynecology

## 2020-07-16 DIAGNOSIS — G40219 Localization-related (focal) (partial) symptomatic epilepsy and epileptic syndromes with complex partial seizures, intractable, without status epilepticus: Secondary | ICD-10-CM | POA: Diagnosis not present

## 2020-07-16 DIAGNOSIS — I429 Cardiomyopathy, unspecified: Secondary | ICD-10-CM | POA: Diagnosis not present

## 2020-08-19 IMAGING — MG DIGITAL SCREENING BILAT W/ TOMO W/ CAD
8 series · 8 of 24 positions shown · non-contrast
Comparison: Previous exam(s).

CLINICAL DATA: Screening.

EXAM:
DIGITAL SCREENING BILATERAL MAMMOGRAM WITH TOMO AND CAD

[L MLO synth-2D]
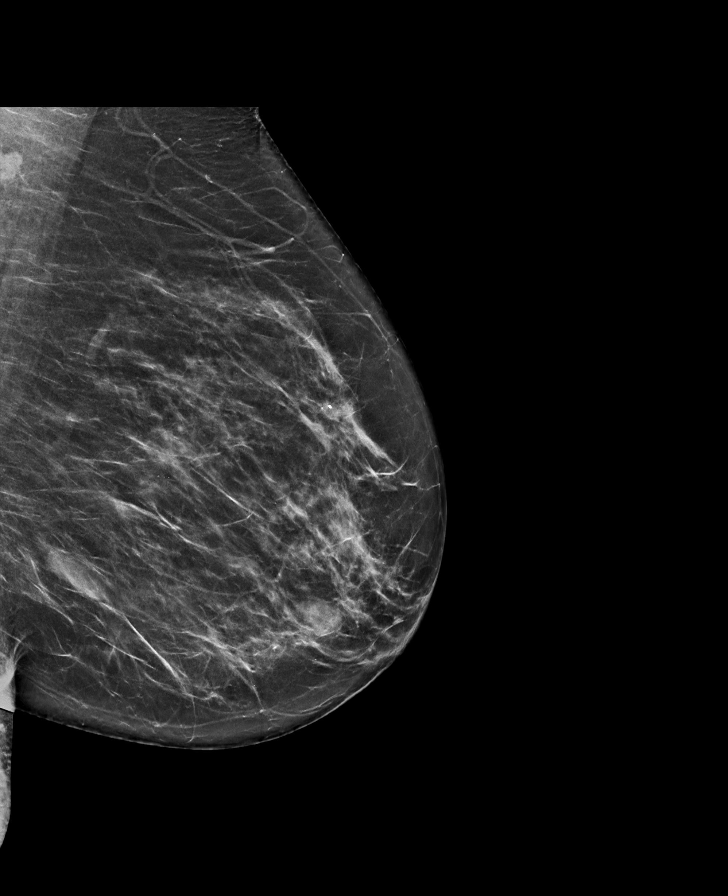

[L CC synth-2D]
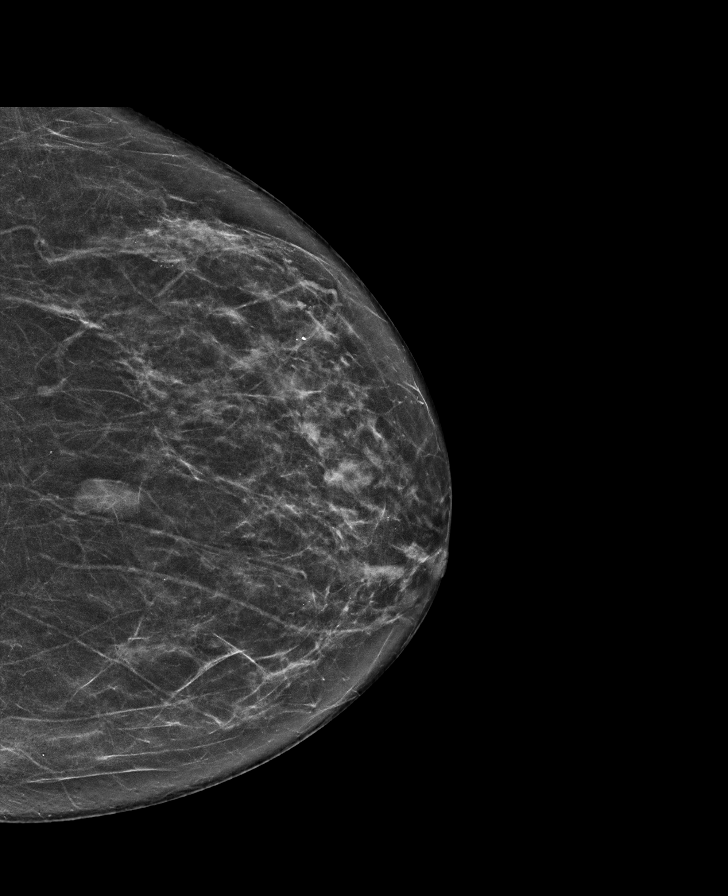

[R MLO synth-2D]
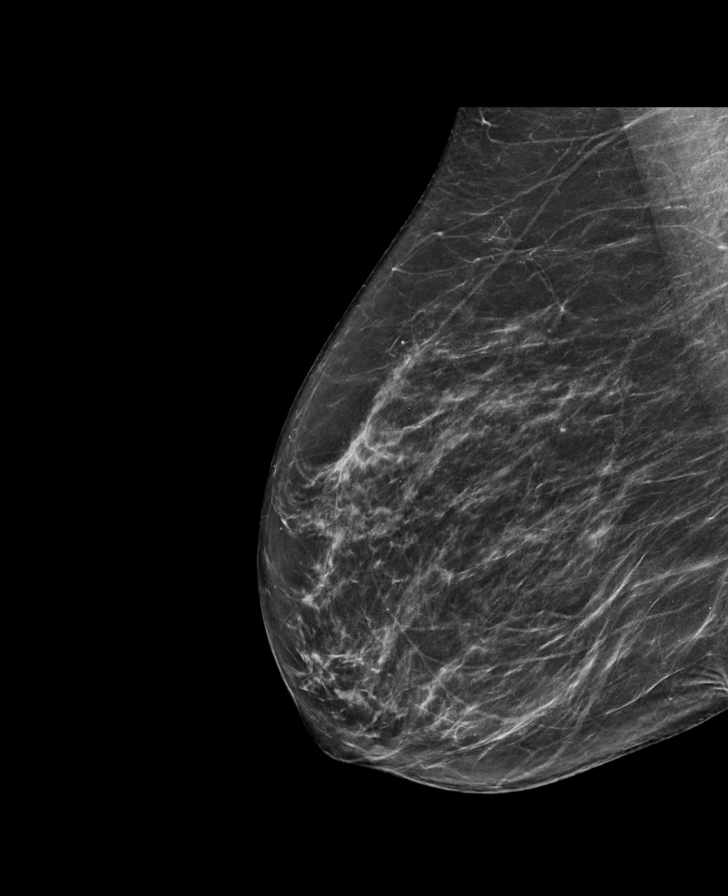

[R CC synth-2D]
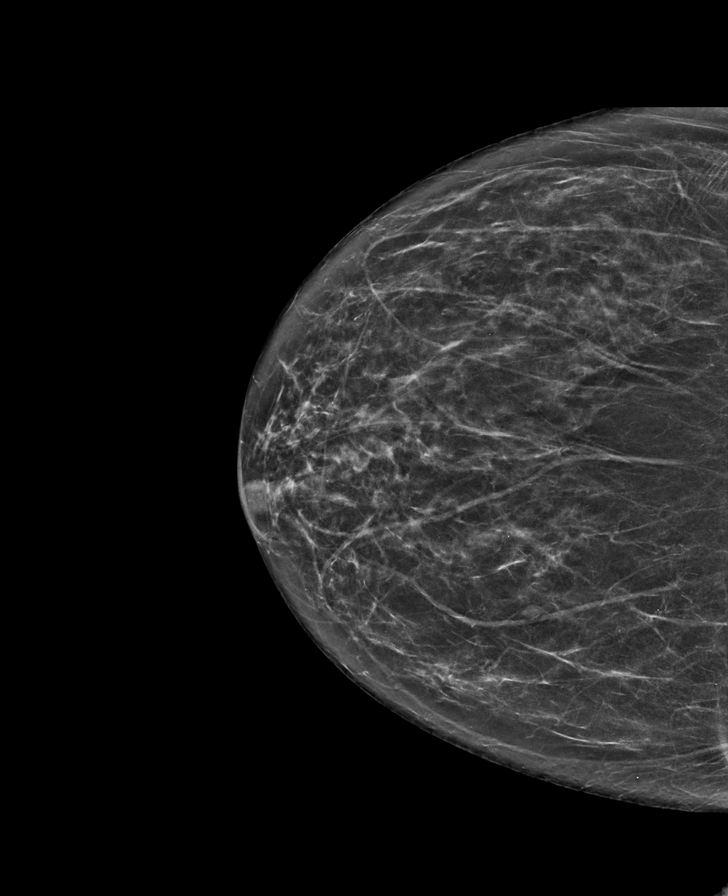

[R CC tomo · tomo slice 31/62.0]
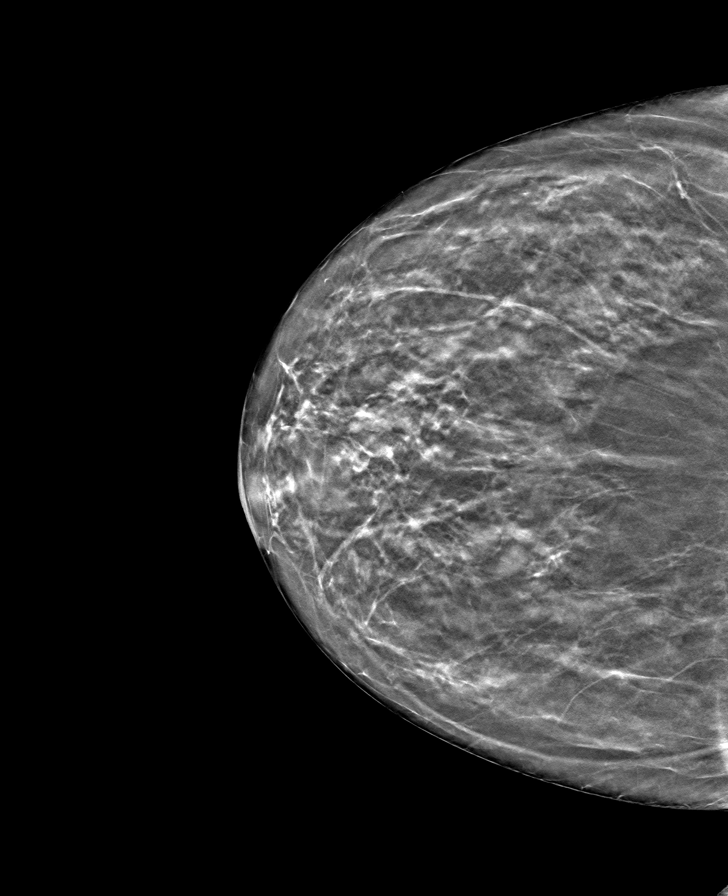

[R MLO tomo · tomo slice 37/72.0]
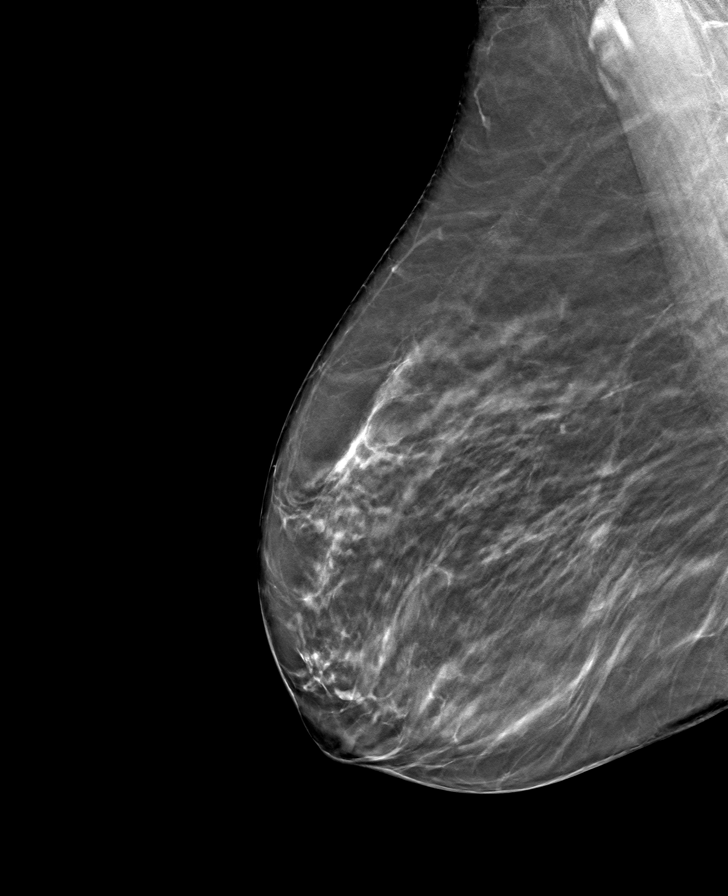

[L MLO tomo · tomo slice 37/73.0]
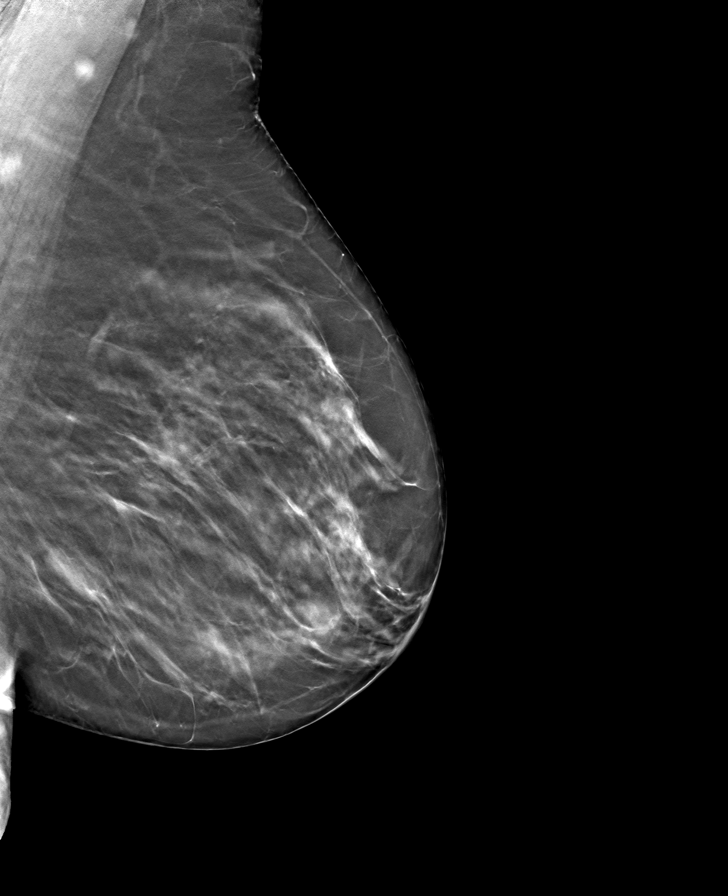

[L CC tomo · tomo slice 33/64.0]
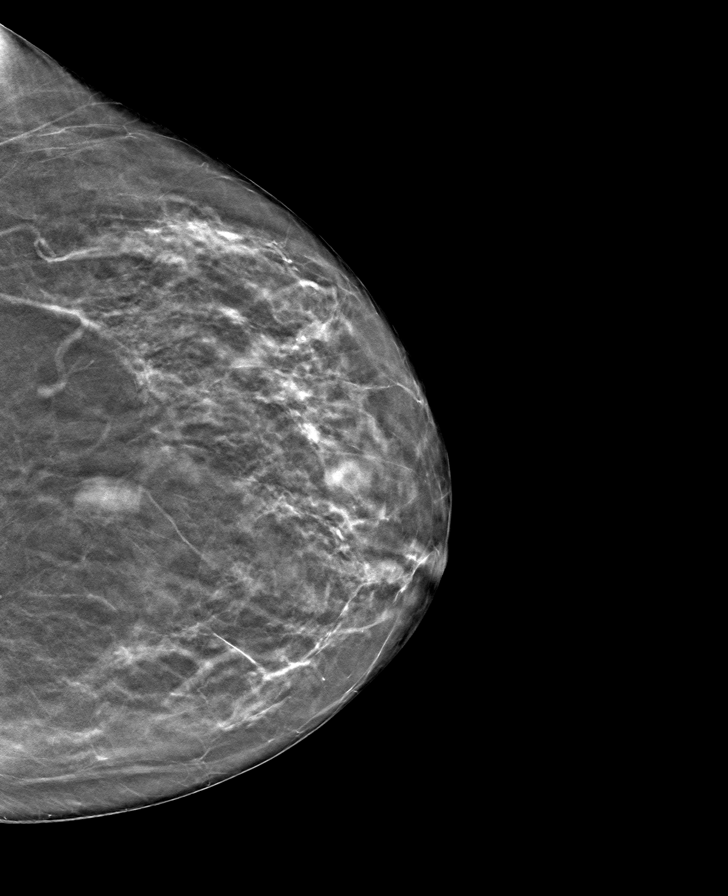

[8 of 24 positions shown; findings below may reference images not displayed]

ACR Breast Density Category b: There are scattered areas of
fibroglandular density.
FINDINGS: There are no findings suspicious for malignancy. Images were
processed with CAD.
IMPRESSION: No mammographic evidence of malignancy. A result letter of this
screening mammogram will be mailed directly to the patient.

RECOMMENDATION:
Screening mammogram in one year. (Code:[id number])

BI-RADS CATEGORY  1: Negative.

## 2020-09-02 ENCOUNTER — Other Ambulatory Visit: Payer: Self-pay | Admitting: Nurse Practitioner

## 2020-09-02 DIAGNOSIS — Z1231 Encounter for screening mammogram for malignant neoplasm of breast: Secondary | ICD-10-CM

## 2020-10-24 ENCOUNTER — Ambulatory Visit
Admission: RE | Admit: 2020-10-24 | Discharge: 2020-10-24 | Disposition: A | Discharge status: Home / Self Care | Payer: Medicare HMO | Source: Ambulatory Visit | Attending: Nurse Practitioner | Admitting: Nurse Practitioner

## 2020-10-24 ENCOUNTER — Other Ambulatory Visit: Payer: Self-pay

## 2020-10-24 DIAGNOSIS — Z1231 Encounter for screening mammogram for malignant neoplasm of breast: Secondary | ICD-10-CM

## 2021-01-16 DIAGNOSIS — G40219 Localization-related (focal) (partial) symptomatic epilepsy and epileptic syndromes with complex partial seizures, intractable, without status epilepticus: Secondary | ICD-10-CM | POA: Diagnosis not present

## 2021-01-16 DIAGNOSIS — R300 Dysuria: Secondary | ICD-10-CM | POA: Diagnosis not present

## 2021-01-16 DIAGNOSIS — R8279 Other abnormal findings on microbiological examination of urine: Secondary | ICD-10-CM | POA: Diagnosis not present

## 2021-01-24 DIAGNOSIS — R9431 Abnormal electrocardiogram [ECG] [EKG]: Secondary | ICD-10-CM | POA: Diagnosis not present

## 2021-01-24 DIAGNOSIS — R001 Bradycardia, unspecified: Secondary | ICD-10-CM | POA: Diagnosis not present

## 2021-01-24 DIAGNOSIS — I429 Cardiomyopathy, unspecified: Secondary | ICD-10-CM | POA: Diagnosis not present

## 2021-02-14 DIAGNOSIS — Z79899 Other long term (current) drug therapy: Secondary | ICD-10-CM | POA: Diagnosis not present

## 2021-02-14 DIAGNOSIS — B001 Herpesviral vesicular dermatitis: Secondary | ICD-10-CM | POA: Diagnosis not present

## 2021-02-14 DIAGNOSIS — R569 Unspecified convulsions: Secondary | ICD-10-CM | POA: Diagnosis not present

## 2021-03-13 ENCOUNTER — Ambulatory Visit (INDEPENDENT_AMBULATORY_CARE_PROVIDER_SITE_OTHER): Payer: Medicare HMO | Admitting: Nurse Practitioner

## 2021-03-13 ENCOUNTER — Encounter: Payer: Self-pay | Admitting: Nurse Practitioner

## 2021-03-13 ENCOUNTER — Other Ambulatory Visit: Payer: Self-pay

## 2021-03-13 VITALS — BP 124/78 | HR 100 | Ht 62.5 in | Wt 154.0 lb

## 2021-03-13 DIAGNOSIS — M8589 Other specified disorders of bone density and structure, multiple sites: Secondary | ICD-10-CM

## 2021-03-13 DIAGNOSIS — K130 Diseases of lips: Secondary | ICD-10-CM

## 2021-03-13 DIAGNOSIS — Z780 Asymptomatic menopausal state: Secondary | ICD-10-CM

## 2021-03-13 DIAGNOSIS — I429 Cardiomyopathy, unspecified: Secondary | ICD-10-CM | POA: Diagnosis not present

## 2021-03-13 DIAGNOSIS — Z01419 Encounter for gynecological examination (general) (routine) without abnormal findings: Secondary | ICD-10-CM

## 2021-03-13 DIAGNOSIS — R9431 Abnormal electrocardiogram [ECG] [EKG]: Secondary | ICD-10-CM | POA: Diagnosis not present

## 2021-03-13 DIAGNOSIS — R001 Bradycardia, unspecified: Secondary | ICD-10-CM | POA: Diagnosis not present

## 2021-03-13 NOTE — Progress Notes (Signed)
Amber Bowman 1955/10/14 [id number]   History:  66 y.o. G0 presents for breast and pelvic exam without GYN complaints. Postmenopausal - no HRT, no bleeding. Normal pap and mammogram history. History of osteopenia, depression, epilepsy. Complains of pain in lips, lower > upper that has been ongoing since she had Covid in 2021. She is unable to eat hot foods/beverages or spicy foods. She has it with brushing her teeth. It feels sore and burns with certain foods/beverages. Her PCP has provided her with a steroid ointment and Valtrex. She does not feel it is improving. Denies any sores or lesions, no changes in products or medications.   Gynecologic History Patient's last menstrual period was 02/11/2008.   Contraception: post menopausal status Sexually active: Yes  Health maintenance Last Pap: 03/12/2020. Results were: Normal Last mammogram: 10/24/2020. Results were: Normal Last colonoscopy: 2014. Results were: Normal  Last Dexa: 04/30/2020. Results were: T-score -2.3, 12% / 2.2%  Past medical history, past surgical history, family history and social history were all reviewed and documented in the EPIC chart. Married. Retired.   ROS:  A ROS was performed and pertinent positives and negatives are included.  Exam:  Vitals:   03/13/21 1016  BP: 124/78  Pulse: 100  SpO2: 96%  Weight: 154 lb (69.9 kg)  Height: 5' 2.5" (1.588 m)    Body mass index is 27.72 kg/m.  General appearance:  Normal Thyroid:  Symmetrical, normal in size, without palpable masses or nodularity. Respiratory  Auscultation:  Clear without wheezing or rhonchi Cardiovascular  Auscultation:  Regular rate, without rubs, murmurs or gallops  Edema/varicosities:  Not grossly evident Abdominal  Soft,nontender, without masses, guarding or rebound.  Liver/spleen:  No organomegaly noted  Hernia:  None appreciated  Skin  Inspection:  Grossly normal. Lips are normal color, no sores or lesions Breasts: Examined lying and  sitting.   Right: Without masses, retractions, discharge or axillary adenopathy.   Left: Without masses, retractions, discharge or axillary adenopathy. Genitourinary   Inguinal/mons:  Normal without inguinal adenopathy  External genitalia:  Normal appearing vulva with no masses, tenderness, or lesions  BUS/Urethra/Skene's glands:  Normal  Vagina:  Normal appearing with normal color and discharge, no lesions. Atrophic changes  Cervix:  Normal appearing without discharge or lesions  Uterus:  Normal in size, shape and contour.  Midline and mobile, nontender  Adnexa/parametria:     Rt: Normal in size, without masses or tenderness.   Lt: Normal in size, without masses or tenderness.  Anus and perineum: Normal  Digital rectal exam: Normal sphincter tone without palpated masses or tenderness  Patient informed chaperone available to be present for breast and pelvic exam. Patient has requested no chaperone to be present. Patient has been advised what will be completed during breast and pelvic exam.   Assessment/Plan:  66 y.o. G0 for breast and pelvic exam.   Well female exam with routine gynecological exam - Education provided on SBEs, importance of preventative screenings, current guidelines, high calcium diet, regular exercise, and multivitamin daily. Labs with PCP.   Postmenopausal - no HRT, no bleeding  Osteopenia of multiple sites - February 2022 T-score -2.3 without elevated FRAX. Continue Vitamin D + Calcium and regular exercise. She walks 3 miles daily with weather permitting. Will repeat DXA next year.   Painful lips - See HPI for detail. Recommend seeing dermatology for evaluation.   Screening for cervical cancer - Normal Pap history. No longer screening per guidelines.   Screening for breast cancer - Normal  mammogram history.  Continue annual screenings.  Normal breast exam today.  Screening for colon cancer - 2014 colonoscopy. Will repeat at GI's recommended interval.   Follow  up in 2 years for breast and pelvic.     [place], 11:11 AM 03/13/2021

## 2021-04-17 DIAGNOSIS — L249 Irritant contact dermatitis, unspecified cause: Secondary | ICD-10-CM | POA: Diagnosis not present

## 2021-05-07 DIAGNOSIS — L249 Irritant contact dermatitis, unspecified cause: Secondary | ICD-10-CM | POA: Diagnosis not present

## 2021-05-15 DIAGNOSIS — Z008 Encounter for other general examination: Secondary | ICD-10-CM | POA: Diagnosis not present

## 2021-05-15 DIAGNOSIS — L309 Dermatitis, unspecified: Secondary | ICD-10-CM | POA: Diagnosis not present

## 2021-05-15 DIAGNOSIS — Z5982 Transportation insecurity: Secondary | ICD-10-CM | POA: Diagnosis not present

## 2021-05-15 DIAGNOSIS — Z833 Family history of diabetes mellitus: Secondary | ICD-10-CM | POA: Diagnosis not present

## 2021-05-15 DIAGNOSIS — Z8249 Family history of ischemic heart disease and other diseases of the circulatory system: Secondary | ICD-10-CM | POA: Diagnosis not present

## 2021-05-15 DIAGNOSIS — B009 Herpesviral infection, unspecified: Secondary | ICD-10-CM | POA: Diagnosis not present

## 2021-05-15 DIAGNOSIS — Z823 Family history of stroke: Secondary | ICD-10-CM | POA: Diagnosis not present

## 2021-05-15 DIAGNOSIS — R32 Unspecified urinary incontinence: Secondary | ICD-10-CM | POA: Diagnosis not present

## 2021-05-15 DIAGNOSIS — R030 Elevated blood-pressure reading, without diagnosis of hypertension: Secondary | ICD-10-CM | POA: Diagnosis not present

## 2021-05-15 DIAGNOSIS — G40909 Epilepsy, unspecified, not intractable, without status epilepticus: Secondary | ICD-10-CM | POA: Diagnosis not present

## 2021-06-25 ENCOUNTER — Other Ambulatory Visit: Payer: Self-pay | Admitting: Urology

## 2021-06-25 ENCOUNTER — Encounter (HOSPITAL_COMMUNITY)
Admission: RE | Disposition: A | Discharge status: Home / Self Care | Payer: Self-pay | Source: Ambulatory Visit | Attending: Urology

## 2021-06-25 ENCOUNTER — Inpatient Hospital Stay (HOSPITAL_BASED_OUTPATIENT_CLINIC_OR_DEPARTMENT_OTHER): Payer: Medicare HMO | Admitting: Certified Registered Nurse Anesthetist

## 2021-06-25 ENCOUNTER — Inpatient Hospital Stay (HOSPITAL_COMMUNITY): Payer: Medicare HMO | Admitting: Certified Registered Nurse Anesthetist

## 2021-06-25 ENCOUNTER — Ambulatory Visit (HOSPITAL_COMMUNITY)
Admission: RE | Admit: 2021-06-25 | Discharge: 2021-06-25 | Disposition: A | Discharge status: Home / Self Care | Payer: Medicare HMO | Source: Ambulatory Visit | Attending: Urology | Admitting: Urology

## 2021-06-25 ENCOUNTER — Encounter (HOSPITAL_COMMUNITY): Payer: Self-pay | Admitting: Urology

## 2021-06-25 ENCOUNTER — Inpatient Hospital Stay (HOSPITAL_COMMUNITY): Payer: Medicare HMO

## 2021-06-25 DIAGNOSIS — N132 Hydronephrosis with renal and ureteral calculous obstruction: Secondary | ICD-10-CM | POA: Diagnosis not present

## 2021-06-25 DIAGNOSIS — N390 Urinary tract infection, site not specified: Secondary | ICD-10-CM

## 2021-06-25 DIAGNOSIS — K8020 Calculus of gallbladder without cholecystitis without obstruction: Secondary | ICD-10-CM | POA: Diagnosis not present

## 2021-06-25 DIAGNOSIS — N210 Calculus in bladder: Secondary | ICD-10-CM

## 2021-06-25 DIAGNOSIS — I509 Heart failure, unspecified: Secondary | ICD-10-CM | POA: Diagnosis not present

## 2021-06-25 DIAGNOSIS — R569 Unspecified convulsions: Secondary | ICD-10-CM | POA: Diagnosis not present

## 2021-06-25 DIAGNOSIS — R300 Dysuria: Secondary | ICD-10-CM | POA: Diagnosis not present

## 2021-06-25 DIAGNOSIS — N201 Calculus of ureter: Secondary | ICD-10-CM | POA: Diagnosis not present

## 2021-06-25 DIAGNOSIS — N3000 Acute cystitis without hematuria: Secondary | ICD-10-CM | POA: Diagnosis not present

## 2021-06-25 HISTORY — PX: CYSTOSCOPY W/ RETROGRADES: SHX1426

## 2021-06-25 LAB — POCT I-STAT, CHEM 8
BUN: 29 mg/dL — ABNORMAL HIGH (ref 8–23)
Calcium, Ion: 1.24 mmol/L (ref 1.15–1.40)
Chloride: 108 mmol/L (ref 98–111)
Creatinine, Ser: 1.6 mg/dL — ABNORMAL HIGH (ref 0.44–1.00)
Glucose, Bld: 104 mg/dL — ABNORMAL HIGH (ref 70–99)
HCT: 46 % (ref 36.0–46.0)
Hemoglobin: 15.6 g/dL — ABNORMAL HIGH (ref 12.0–15.0)
Potassium: 3.7 mmol/L (ref 3.5–5.1)
Sodium: 138 mmol/L (ref 135–145)
TCO2: 19 mmol/L — ABNORMAL LOW (ref 22–32)

## 2021-06-25 SURGERY — CYSTOSCOPY, WITH RETROGRADE PYELOGRAM
Anesthesia: General | Site: Urethra | Laterality: Right

## 2021-06-25 MED ORDER — FENTANYL CITRATE PF 50 MCG/ML IJ SOSY: 25.0000 ug | PREFILLED_SYRINGE | INTRAMUSCULAR | Status: DC | PRN

## 2021-06-25 MED ORDER — IOHEXOL 300 MG/ML  SOLN
INTRAMUSCULAR | Status: DC | PRN
  Administered 2021-06-25: 13 mL via URETHRAL

## 2021-06-25 MED ORDER — LIDOCAINE HCL (PF) 2 % IJ SOLN
INTRAMUSCULAR | Status: AC
  Filled 2021-06-25: qty 5

## 2021-06-25 MED ORDER — FENTANYL CITRATE (PF) 100 MCG/2ML IJ SOLN
INTRAMUSCULAR | Status: AC
  Filled 2021-06-25: qty 2

## 2021-06-25 MED ORDER — MIDAZOLAM HCL 5 MG/5ML IJ SOLN
INTRAMUSCULAR | Status: DC | PRN
  Administered 2021-06-25: 2 mg via INTRAVENOUS

## 2021-06-25 MED ORDER — PROPOFOL 10 MG/ML IV BOLUS
INTRAVENOUS | Status: AC
  Filled 2021-06-25: qty 20

## 2021-06-25 MED ORDER — LIDOCAINE 2% (20 MG/ML) 5 ML SYRINGE
INTRAMUSCULAR | Status: DC | PRN
  Administered 2021-06-25: 60 mg via INTRAVENOUS

## 2021-06-25 MED ORDER — PHENYLEPHRINE 80 MCG/ML (10ML) SYRINGE FOR IV PUSH (FOR BLOOD PRESSURE SUPPORT)
PREFILLED_SYRINGE | INTRAVENOUS | Status: AC
  Filled 2021-06-25: qty 10

## 2021-06-25 MED ORDER — SODIUM CHLORIDE 0.9 % IV SOLN
2.0000 g | Freq: Once | INTRAVENOUS | Status: AC
  Administered 2021-06-25: 1 g via INTRAVENOUS

## 2021-06-25 MED ORDER — CHLORHEXIDINE GLUCONATE 0.12 % MT SOLN: 15.0000 mL | OROMUCOSAL | Status: DC

## 2021-06-25 MED ORDER — PHENYLEPHRINE 80 MCG/ML (10ML) SYRINGE FOR IV PUSH (FOR BLOOD PRESSURE SUPPORT)
PREFILLED_SYRINGE | INTRAVENOUS | Status: DC | PRN
  Administered 2021-06-25 (×2): 80 ug via INTRAVENOUS

## 2021-06-25 MED ORDER — CEFDINIR 300 MG PO CAPS: 300.0000 mg | ORAL_CAPSULE | Freq: Two times a day (BID) | ORAL | 0 refills | Status: DC

## 2021-06-25 MED ORDER — DEXAMETHASONE SODIUM PHOSPHATE 10 MG/ML IJ SOLN
INTRAMUSCULAR | Status: AC
  Filled 2021-06-25: qty 1

## 2021-06-25 MED ORDER — FENTANYL CITRATE PF 50 MCG/ML IJ SOSY
25.0000 ug | PREFILLED_SYRINGE | INTRAMUSCULAR | Status: DC
Start: 2021-06-25 — End: 2021-06-25
  Administered 2021-06-25: 25 ug via INTRAVENOUS
  Filled 2021-06-25: qty 1

## 2021-06-25 MED ORDER — PROPOFOL 10 MG/ML IV BOLUS
INTRAVENOUS | Status: DC | PRN
  Administered 2021-06-25: 130 mg via INTRAVENOUS

## 2021-06-25 MED ORDER — MIDAZOLAM HCL 2 MG/2ML IJ SOLN
INTRAMUSCULAR | Status: AC
  Filled 2021-06-25: qty 2

## 2021-06-25 MED ORDER — ONDANSETRON HCL 4 MG/2ML IJ SOLN
INTRAMUSCULAR | Status: AC
  Filled 2021-06-25: qty 2

## 2021-06-25 MED ORDER — LACTATED RINGERS IV SOLN
INTRAVENOUS | Status: DC
Start: 2021-06-25 — End: 2021-06-25

## 2021-06-25 MED ORDER — DEXAMETHASONE SODIUM PHOSPHATE 10 MG/ML IJ SOLN
INTRAMUSCULAR | Status: DC | PRN
  Administered 2021-06-25: 8 mg via INTRAVENOUS

## 2021-06-25 MED ORDER — SODIUM CHLORIDE 0.9 % IV SOLN
INTRAVENOUS | Status: AC
  Filled 2021-06-25: qty 20

## 2021-06-25 MED ORDER — SODIUM CHLORIDE 0.9 % IR SOLN
Status: DC | PRN
  Administered 2021-06-25: 3000 mL via INTRAVESICAL
  Administered 2021-06-25: 1000 mL via INTRAVESICAL

## 2021-06-25 MED ORDER — ONDANSETRON HCL 4 MG/2ML IJ SOLN
INTRAMUSCULAR | Status: DC | PRN
  Administered 2021-06-25: 4 mg via INTRAVENOUS

## 2021-06-25 MED ORDER — FENTANYL CITRATE (PF) 100 MCG/2ML IJ SOLN
INTRAMUSCULAR | Status: DC | PRN
  Administered 2021-06-25: 50 ug via INTRAVENOUS

## 2021-06-25 SURGICAL SUPPLY — 25 items
BAG COUNTER SPONGE SURGICOUNT (BAG) IMPLANT
BAG SPNG CNTER NS LX DISP (BAG)
BAG URO CATCHER STRL LF (MISCELLANEOUS) ×4 IMPLANT
BASKET ZERO TIP NITINOL 2.4FR (BASKET) IMPLANT
BSKT STON RTRVL ZERO TP 2.4FR (BASKET)
CATH URETL OPEN END 6FR 70 (CATHETERS) IMPLANT
CLOTH BEACON ORANGE TIMEOUT ST (SAFETY) ×4 IMPLANT
GLOVE SS BIOGEL STRL SZ 6.5 (GLOVE) ×1 IMPLANT
GLOVE SUPERSENSE BIOGEL SZ 6.5 (GLOVE) ×1
GLOVE SURG LX 7.5 STRW (GLOVE) ×1
GLOVE SURG LX STRL 7.5 STRW (GLOVE) ×3 IMPLANT
GOWN STRL REUS W/TWL LRG LVL3 (GOWN DISPOSABLE) ×4 IMPLANT
GUIDEWIRE STR DUAL SENSOR (WIRE) ×4 IMPLANT
GUIDEWIRE ZIPWRE .038 STRAIGHT (WIRE) IMPLANT
IV NS 1000ML (IV SOLUTION) ×3
IV NS 1000ML BAXH (IV SOLUTION) ×3 IMPLANT
KIT TURNOVER KIT A (KITS) ×2 IMPLANT
LASER FIB FLEXIVA PULSE ID 365 (Laser) IMPLANT
MANIFOLD NEPTUNE II (INSTRUMENTS) ×4 IMPLANT
PACK CYSTO (CUSTOM PROCEDURE TRAY) ×4 IMPLANT
SHEATH NAVIGATOR HD 11/13X36 (SHEATH) IMPLANT
TRACTIP FLEXIVA PULS ID 200XHI (Laser) IMPLANT
TRACTIP FLEXIVA PULSE ID 200 (Laser)
TUBING CONNECTING 10 (TUBING) ×4 IMPLANT
TUBING UROLOGY SET (TUBING) ×4 IMPLANT

## 2021-06-25 NOTE — Transfer of Care (Signed)
Immediate Anesthesia Transfer of Care Note ? ?Patient: Amber Bowman ? ?Procedure(s) Performed: CYSTOSCOPY WITH RIGHT RETROGRADE PYELOGRAM, REMOVAL OF BLADDER STONE (Right: Urethra) ? ?Patient Location: PACU ? ?Anesthesia Type:General ? ?Level of Consciousness: drowsy and patient cooperative ? ?Airway & Oxygen Therapy: Patient Spontanous Breathing and Patient connected to face mask oxygen ? ?Post-op Assessment: Report given to RN and Post -op Vital signs reviewed and stable ? ?Post vital signs: Reviewed and stable ? ?Last Vitals:  ?Vitals Value Taken Time  ?BP 105/55 06/25/21 1726  ?Temp 37.7 ?C 06/25/21 1726  ?Pulse 80 06/25/21 1728  ?Resp 13 06/25/21 1728  ?SpO2 100 % 06/25/21 1728  ?Vitals shown include unvalidated device data. ? ?Last Pain:  ?Vitals:  ? 06/25/21 1726  ?TempSrc:   ?PainSc: 0-No pain  ?   ? ?  ? ?Complications: No notable events documented. ?

## 2021-06-25 NOTE — H&P (Signed)
? ? ?Office Visit Report     06/25/2021  ? ?-------------------------------------------------------------------------------- ?  ?Amber Bowman  ?MRN: [id number]  ?DOB: 1955/03/10, 66 year old Female  ?SSN:   ? PRIMARY CARE:    ?REFERRING:  Ammie Dalton, NP  ?PROVIDER:  Raynelle Bring, M.D.  ?TREATING:  Jiles Crocker, NP  ?LOCATION:  [facility] [phone]  ?  ? ?-------------------------------------------------------------------------------- ?  ?CC/HPI: 01/31/2020: Amber Bowman follows up today following her episode of a left ureteral stone and sepsis requiring nephrostomy tube placement. She subsequently underwent definitive  ?therapy with ureteroscopic laser lithotripsy and stone removal. Her stent was removed and she follows up today with a renal ultrasound. She denies any flank pain or hematuria.  ?She has not passed any additional fragments.  ? ?01/16/2021: Patient past history of nephrolithiasis, above-noted history. Returns today for concerns of infection. Complaining of 1 month history of increased urinary frequency with urgency from baseline. Also associated with burning and painful urination that persist after voiding. Not associated with stone material passage, changes in force of stream, visible blood in the urine or interval stone material passage. For the past 4 months, she is also been having low back pain primarily on the right side. Pain can be constant as well as intermittent without modifying or alleviating factors. No radiation to the flank or lower abdomen. Symptoms are not associated with fevers or chills, nausea/vomiting.  ? ?06/25/2021: I treated her for pansensitive E. coli at last office visit. Now back today due to concerns over possible kidney stone versus UTI. Symptoms began a couple of days ago. She is describing pain in the right lower back with radiation into the right lower quadrant of the abdomen. Also associated with increased frequency/urgency and dysuria. She  denies hematuria, fevers or chills. She does endorse some mild nausea but has not been vomiting.  ? ?  ?ALLERGIES: None  ? ?MEDICATIONS: Aspirin 81 mg tablet,chewable  ?Cranberry  ?Felbamate  ?Fish Oil  ?J-Cap Eye Vitamin  ?Levetiracetam 500 mg tablet  ?Rosuvastatin Calcium 10 mg tablet  ?Topiramate 100 mg tablet  ?Tramadol Hcl 50 mg tablet  ?Turmeric Curcumin Complex  ?Vitamin D3  ?Zonisamide 100 mg capsule  ?  ? ?GU PSH: Cysto Remove Stent FB Sim - 10/03/2019 ?Remove Renal Tube W/fluoro, Left - 09/04/2019 ?Ureteroscopic laser litho, Left - 09/04/2019 ? ?  ? ?NON-GU PSH: None  ? ?GU PMH: Dysuria - 01/16/2021 ?Ureteral calculus - 01/31/2020, - 08/11/2019 ?  ?   ?PMH Notes:  ? ?1) Urolithiasis: She presented in April 2021 with a left ureteral stone and sepsis. She was diagnosed with COVID and had a left PCN placed. She had a prolonged hospital course.  ? ?Jul 2021: Left ureteroscopic stone removal (calcium oxalate/calcium phosphate)  ?  ? ?NON-GU PMH: Pyuria/other UA findings - 01/16/2021 ?Depression ?Seizure disorder ?  ? ?FAMILY HISTORY: Diabetes - Runs in Family ?Heart Attack - Father, Mother ?Hypertension - Mother, Father, Runs in Family  ?  Notes: 0 children  ? ?SOCIAL HISTORY: Marital Status: Married ?Preferred Language: [place]; Ethnicity: Not Hispanic Or Latino; Race: White ?Current Smoking Status: Patient has never smoked.  ? ?Tobacco Use Assessment Completed: Used Tobacco in last 30 days? ?Has never drank.  ?Drinks 4+ caffeinated drinks per day. ?  ? ?REVIEW OF SYSTEMS:    ?GU Review Female:   Patient reports frequent urination, hard to postpone urination, and burning /pain with urination. Patient denies get up at night to urinate, leakage of  urine, stream starts and stops, trouble starting your stream, have to strain to urinate, and being pregnant.  ?Gastrointestinal (Upper):   Patient reports nausea and indigestion/ heartburn. Patient denies vomiting.  ?Gastrointestinal (Lower):   Patient denies diarrhea and  constipation.  ?Constitutional:   Patient denies fever, night sweats, weight loss, and fatigue.  ?Skin:   Patient denies skin rash/ lesion and itching.  ?Eyes:   Patient denies blurred vision and double vision.  ?Ears/ Nose/ Throat:   Patient denies sore throat and sinus problems.  ?Hematologic/Lymphatic:   Patient denies easy bruising and swollen glands.  ?Cardiovascular:   Patient denies leg swelling and chest pains.  ?Respiratory:   Patient denies cough and shortness of breath.  ?Endocrine:   Patient denies excessive thirst.  ?Musculoskeletal:   Patient reports back pain. Patient denies joint pain.  ?Neurological:   Patient reports dizziness. Patient denies headaches.  ?Psychologic:   Patient denies depression and anxiety.  ? ?VITAL SIGNS:    ?  06/25/2021 08:44 AM  ?BP 108/70 mmHg  ?Pulse 94 /min  ?Temperature 98.4 F / 36.8 C  ? ?MULTI-SYSTEM PHYSICAL EXAMINATION:    ?Constitutional: Well-nourished. No physical deformities. Normally developed. Good grooming. Appears uncomfortable.  ?Neck: Neck symmetrical, not swollen. Normal tracheal position.  ?Respiratory: Normal breath sounds. No labored breathing, no use of accessory muscles.   ?Cardiovascular: Regular rate and rhythm. No murmur, no gallop. Normal temperature, normal extremity pulses, no swelling, no varicosities.   ?Skin: No paleness, no jaundice, no cyanosis. No lesion, no ulcer, no rash.  ?Neurologic / Psychiatric: Oriented to time, oriented to place, oriented to person. No depression, no anxiety, no agitation.  ?Gastrointestinal: No mass, no tenderness, no rigidity, non obese abdomen. There is no CVA of flank tenderness but there is palpable tenderness in the right lower back over the upper portion of the right hip.  ?Musculoskeletal: Normal gait and station of head and neck.  ? ?  ?Complexity of Data:  ?Source Of History:  Patient, Medical Record Summary  ?Records Review:   Previous Doctor Records, Previous Hospital Records, Previous Patient Records   ?Urine Test Review:   Urinalysis, Urine Culture  ?X-Ray Review: KUB: Reviewed Films.  ?Renal Ultrasound: Reviewed Films.  ?C.T. Abdomen/Pelvis: Reviewed Films. Reviewed Report.  ?  ? ?PROCEDURES:    ?     C.T. Urogram - [id number]  ?  ?  ?Patient confirmed No Neulasta OnPro Device.  ? ? ?     Urinalysis w/Scope ?Dipstick Dipstick Cont'd Micro  ?Color: Yellow Bilirubin: Neg mg/dL WBC/hpf: 6 - 10/hpf  ?Appearance: Clear Ketones: 1+ mg/dL RBC/hpf: >60/hpf  ?Specific Gravity: 1.030 Blood: 3+ ery/uL Bacteria: Many (>50/hpf)  ?pH: 5.5 Protein: 1+ mg/dL Cystals: NS (Not Seen)  ?Glucose: Neg mg/dL Urobilinogen: 0.2 mg/dL Casts: NS (Not Seen)  ?  Nitrites: Positive Trichomonas: Not Present  ?  Leukocyte Esterase: 1+ leu/uL Mucous: Present  ?    Epithelial Cells: 0 - 5/hpf  ?    Yeast: NS (Not Seen)  ?    Sperm: Not Present  ? ? ?     Ceftriaxone 1g - [id number], [id number] ?The area was prepped and cleaned using sterile technique. A band aid was applied. The pt tolerated well.  ? ?Qty: 1 Adm. By: Melvyn Novas  ?Unit: gram Lot No [id number]  ?Route: IM Exp. Date 07/08/2022  ?Freq: None Mfgr.:   ?Site: Right Buttock  ? ?ASSESSMENT:  ?    ICD-10 Details  ?1 GU:   Ureteral calculus -  N20.1 Right, Acute, Threat to Bodily Function  ?2   Acute Cystitis/UTI - N30.00 Acute, Threat to Bodily Function  ? ?PLAN:    ? ?      Orders ?Labs Urine Culture  ?X-Rays: C.T. Stone Protocol Without I.V. Contrast  ?X-Ray Notes: History: ? ?Hematuria: Yes/No ? ?Patient to see MD after exam: Yes/No ? ?Previous exam: CT / IVP/ US/ KUB/ None ? ?When: ? ?Where: ? ?Diabetic: Yes/ No ? ?BUN/ Creatinine: ? ?Date of last BUN Creatinine: ? ?Weight in pounds: ? ?Allergy- IV Contrast: Yes/ No ? ?Conflicting diabetic meds: Yes/ No ? ?Diabetic Meds: ? ?Prior Authorization #: Bernadene Person Hubbard #[id number] Valid 06/25/21 thru 12/22/21 ? ?  ? ? ?      Schedule ?Return Visit/Planned Activity: ASAP - Schedule Surgery  ?Procedure: 06/25/2021 at [facility] -  [phone] - Ceftriaxone 1g (Injection, Ceftriaxone Sodium, Per 250 Mg) - P1121, [phone]  ? ? ?      Document ?Letter(s):  Created for Patient: Clinical Summary  ? ? ?     Notes:   Patient with past history of sepsi

## 2021-06-25 NOTE — Discharge Instructions (Addendum)
You may see some blood in the urine and may have some burning with urination for 48-72 hours. You also may notice that you have to urinate more frequently or urgently after your procedure which is normal.  You should call should you develop an inability urinate, fever > 101, persistent nausea and vomiting that prevents you from eating or drinking to stay hydrated.    

## 2021-06-25 NOTE — Op Note (Addendum)
Preoperative diagnosis:  ?Right ureteral stone ?UTI  ? ?Postoperative diagnosis:  ?Bladder stone ?UTI  ? ?Procedure: ? ?Cystoscopy ?Right retrograde pyelography with interpretation  ?Removal of bladder calculus (6 mm) ? ?Surgeon: Roxy Horseman, Brooke Bonito. M.D. ? ?Anesthesia: General ? ?Complications: None ? ?Intraoperative findings: Right retrograde pyelography demonstrated a normal caliber ureter without hydronephrosis.  No filling defects noted. ? ?EBL: Minimal ? ?Specimens: None ? ?Indication: Amber Bowman is a 66 y.o. patient with a right ureteral stone and UTI. After reviewing the management options for treatment, he elected to proceed with the above surgical procedure(s). We have discussed the potential benefits and risks of the procedure, side effects of the proposed treatment, the likelihood of the patient achieving the goals of the procedure, and any potential problems that might occur during the procedure or recuperation. Informed consent has been obtained. ? ?Description of procedure: ? ?The patient was taken to the operating room and general anesthesia was induced.  The patient was placed in the dorsal lithotomy position, prepped and draped in the usual sterile fashion, and preoperative antibiotics were administered. A preoperative time-out was performed.  ? ?Cystourethroscopy was performed.  The patient?s urethra was examined and was normal. The bladder was then systematically examined in its entirety. There was no evidence for any bladder tumors or other mucosal pathology.  On inspection, there was noted to be a 6 mm calculus within the bladder suggesting that she had passed her stone from her ureter into her bladder.  The stone was removed from the bladder. ? ?Attention then turned to the right ureteral orifice and a ureteral catheter was used to intubate the ureteral orifice.  Omnipaque contrast was injected through the ureteral catheter and a retrograde pyelogram was performed with findings as  dictated above.  No remaining filling defects were noted to suggest additional calculi.  As such, she was nonobstructed and did not require stent placement. ? ?The bladder was then emptied and the procedure ended.  The patient appeared to tolerate the procedure well and without complications.  The patient was able to be awakened and transferred to the recovery unit in satisfactory condition.  ? ? ?Pryor Curia MD  ?

## 2021-06-25 NOTE — Anesthesia Preprocedure Evaluation (Addendum)
Anesthesia Evaluation  ?Patient identified by MRN, date of birth, ID band ?Patient awake ? ? ? ?Reviewed: ?Allergy & Precautions, NPO status , Patient's Chart, lab work & pertinent test results ? ?Airway ?Mallampati: II ? ?TM Distance: >3 FB ? ? ? ? Dental ?  ?Pulmonary ? ?  ?breath sounds clear to auscultation ? ? ? ? ? ? Cardiovascular ?+CHF  ? ?Rhythm:Regular Rate:Normal ? ? ?  ?Neuro/Psych ?Seizures -,  PSYCHIATRIC DISORDERS   ? GI/Hepatic ?negative GI ROS, Neg liver ROS,   ?Endo/Other  ? ? Renal/GU ?Renal disease  ? ?  ?Musculoskeletal ? ? Abdominal ?  ?Peds ? Hematology ?  ?Anesthesia Other Findings ? ? Reproductive/Obstetrics ? ?  ? ? ? ? ? ? ? ? ? ? ? ? ? ?  ?  ? ? ? ? ? ? ? ? ?Anesthesia Physical ?Anesthesia Plan ? ?ASA: 3 ? ?Anesthesia Plan: General  ? ?Post-op Pain Management:   ? ?Induction: Intravenous ? ?PONV Risk Score and Plan: 3 and Ondansetron, Dexamethasone and Midazolam ? ?Airway Management Planned: LMA ? ?Additional Equipment:  ? ?Intra-op Plan:  ? ?Post-operative Plan: Extubation in OR ? ?Informed Consent: I have reviewed the patients History and Physical, chart, labs and discussed the procedure including the risks, benefits and alternatives for the proposed anesthesia with the patient or authorized representative who has indicated his/her understanding and acceptance.  ? ? ? ?Dental advisory given ? ?Plan Discussed with: CRNA and Anesthesiologist ? ?Anesthesia Plan Comments:   ? ? ? ? ? ? ?Anesthesia Quick Evaluation ? ?

## 2021-06-25 NOTE — Anesthesia Procedure Notes (Signed)
Procedure Name: LMA Insertion ?Date/Time: 06/25/2021 4:54 PM ?Performed by: Montel Clock, CRNA ?Pre-anesthesia Checklist: Patient identified, Emergency Drugs available, Suction available, Patient being monitored and Timeout performed ?Patient Re-evaluated:Patient Re-evaluated prior to induction ?Oxygen Delivery Method: Circle system utilized ?Preoxygenation: Pre-oxygenation with 100% oxygen ?Induction Type: IV induction ?LMA: LMA with gastric port inserted ?LMA Size: 4.0 ?Number of attempts: 1 ?Dental Injury: Teeth and Oropharynx as per pre-operative assessment  ? ? ? ? ?

## 2021-06-26 ENCOUNTER — Encounter (HOSPITAL_COMMUNITY): Payer: Self-pay | Admitting: Urology

## 2021-06-26 NOTE — Anesthesia Postprocedure Evaluation (Signed)
Anesthesia Post Note ? ?Patient: Amber Bowman ? ?Procedure(s) Performed: CYSTOSCOPY WITH RIGHT RETROGRADE PYELOGRAM, REMOVAL OF BLADDER STONE (Right: Urethra) ? ?  ? ?Patient location during evaluation: PACU ?Anesthesia Type: General ?Level of consciousness: awake ?Pain management: pain level controlled ?Vital Signs Assessment: post-procedure vital signs reviewed and stable ?Respiratory status: spontaneous breathing ?Cardiovascular status: stable ?Postop Assessment: no apparent nausea or vomiting ?Anesthetic complications: no ? ? ?No notable events documented. ? ?Last Vitals:  ?Vitals:  ? 06/25/21 1740 06/25/21 1745  ?BP:  (!) 103/58  ?Pulse: 88 88  ?Resp: 16 19  ?Temp:  37.1 ?C  ?SpO2: 96% 98%  ?  ?Last Pain:  ?Vitals:  ? 06/25/21 1745  ?TempSrc:   ?PainSc: 0-No pain  ? ? ?  ?  ?  ?  ?  ?  ? ?Noa Constante ? ? ? ? ?

## 2021-07-07 DIAGNOSIS — E7800 Pure hypercholesterolemia, unspecified: Secondary | ICD-10-CM | POA: Diagnosis not present

## 2021-07-07 DIAGNOSIS — Z0000 Encounter for general adult medical examination without abnormal findings: Secondary | ICD-10-CM | POA: Diagnosis not present

## 2021-07-07 DIAGNOSIS — R569 Unspecified convulsions: Secondary | ICD-10-CM | POA: Diagnosis not present

## 2021-07-07 DIAGNOSIS — K219 Gastro-esophageal reflux disease without esophagitis: Secondary | ICD-10-CM | POA: Diagnosis not present

## 2021-07-07 DIAGNOSIS — Z79899 Other long term (current) drug therapy: Secondary | ICD-10-CM | POA: Diagnosis not present

## 2021-07-07 DIAGNOSIS — E2839 Other primary ovarian failure: Secondary | ICD-10-CM | POA: Diagnosis not present

## 2021-07-08 ENCOUNTER — Other Ambulatory Visit: Payer: Self-pay | Admitting: Family Medicine

## 2021-07-08 DIAGNOSIS — E2839 Other primary ovarian failure: Secondary | ICD-10-CM

## 2021-07-15 DIAGNOSIS — N201 Calculus of ureter: Secondary | ICD-10-CM | POA: Diagnosis not present

## 2021-07-16 DIAGNOSIS — I429 Cardiomyopathy, unspecified: Secondary | ICD-10-CM | POA: Diagnosis not present

## 2021-07-16 DIAGNOSIS — G40219 Localization-related (focal) (partial) symptomatic epilepsy and epileptic syndromes with complex partial seizures, intractable, without status epilepticus: Secondary | ICD-10-CM | POA: Diagnosis not present

## 2021-07-16 DIAGNOSIS — N200 Calculus of kidney: Secondary | ICD-10-CM | POA: Diagnosis not present

## 2021-08-27 DIAGNOSIS — H5212 Myopia, left eye: Secondary | ICD-10-CM | POA: Diagnosis not present

## 2021-09-16 ENCOUNTER — Other Ambulatory Visit: Payer: Self-pay | Admitting: Nurse Practitioner

## 2021-09-16 DIAGNOSIS — Z1231 Encounter for screening mammogram for malignant neoplasm of breast: Secondary | ICD-10-CM

## 2021-09-29 DIAGNOSIS — L821 Other seborrheic keratosis: Secondary | ICD-10-CM | POA: Diagnosis not present

## 2021-10-09 DIAGNOSIS — E7800 Pure hypercholesterolemia, unspecified: Secondary | ICD-10-CM | POA: Diagnosis not present

## 2021-10-21 ENCOUNTER — Other Ambulatory Visit: Payer: Self-pay | Admitting: *Deleted

## 2021-10-21 NOTE — Patient Outreach (Signed)
  Care Coordination   10/21/2021 Name: Amber Bowman MRN: [id number] DOB: Mar 16, 1955   Care Coordination Outreach Attempts:  An unsuccessful telephone outreach was attempted today to offer the patient information about available care coordination services as a benefit of their health plan.   Follow Up Plan:  Additional outreach attempts will be made to offer the patient care coordination information and services.   Encounter Outcome:  No Answer  Care Coordination Interventions Activated:  No   Care Coordination Interventions:  No, not indicated    Raina Mina, RN Care Management Coordinator [place] [facility] [phone]

## 2021-10-23 DIAGNOSIS — K219 Gastro-esophageal reflux disease without esophagitis: Secondary | ICD-10-CM | POA: Diagnosis not present

## 2021-10-27 ENCOUNTER — Ambulatory Visit
Admission: RE | Admit: 2021-10-27 | Discharge: 2021-10-27 | Disposition: A | Discharge status: Home / Self Care | Payer: Medicare HMO | Source: Ambulatory Visit | Attending: Nurse Practitioner | Admitting: Nurse Practitioner

## 2021-10-27 DIAGNOSIS — Z1231 Encounter for screening mammogram for malignant neoplasm of breast: Secondary | ICD-10-CM | POA: Diagnosis not present

## 2021-10-29 ENCOUNTER — Other Ambulatory Visit: Payer: Self-pay | Admitting: Nurse Practitioner

## 2021-10-29 DIAGNOSIS — R928 Other abnormal and inconclusive findings on diagnostic imaging of breast: Secondary | ICD-10-CM

## 2021-10-30 DIAGNOSIS — H18413 Arcus senilis, bilateral: Secondary | ICD-10-CM | POA: Diagnosis not present

## 2021-10-30 DIAGNOSIS — H02831 Dermatochalasis of right upper eyelid: Secondary | ICD-10-CM | POA: Diagnosis not present

## 2021-10-30 DIAGNOSIS — Z961 Presence of intraocular lens: Secondary | ICD-10-CM | POA: Diagnosis not present

## 2021-10-30 DIAGNOSIS — H26493 Other secondary cataract, bilateral: Secondary | ICD-10-CM | POA: Diagnosis not present

## 2021-10-30 DIAGNOSIS — H26492 Other secondary cataract, left eye: Secondary | ICD-10-CM | POA: Diagnosis not present

## 2021-11-06 ENCOUNTER — Ambulatory Visit
Admission: RE | Admit: 2021-11-06 | Discharge: 2021-11-06 | Disposition: A | Discharge status: Home / Self Care | Payer: Medicare HMO | Source: Ambulatory Visit | Attending: Nurse Practitioner | Admitting: Nurse Practitioner

## 2021-11-06 ENCOUNTER — Other Ambulatory Visit: Payer: Self-pay | Admitting: Nurse Practitioner

## 2021-11-06 DIAGNOSIS — N6320 Unspecified lump in the left breast, unspecified quadrant: Secondary | ICD-10-CM

## 2021-11-06 DIAGNOSIS — N6321 Unspecified lump in the left breast, upper outer quadrant: Secondary | ICD-10-CM | POA: Diagnosis not present

## 2021-11-06 DIAGNOSIS — R928 Other abnormal and inconclusive findings on diagnostic imaging of breast: Secondary | ICD-10-CM

## 2021-11-06 DIAGNOSIS — Z0100 Encounter for examination of eyes and vision without abnormal findings: Secondary | ICD-10-CM | POA: Diagnosis not present

## 2021-11-06 DIAGNOSIS — N6323 Unspecified lump in the left breast, lower outer quadrant: Secondary | ICD-10-CM | POA: Diagnosis not present

## 2021-11-06 DIAGNOSIS — R922 Inconclusive mammogram: Secondary | ICD-10-CM | POA: Diagnosis not present

## 2021-11-13 ENCOUNTER — Other Ambulatory Visit: Payer: Self-pay | Admitting: Nurse Practitioner

## 2021-11-13 ENCOUNTER — Ambulatory Visit
Admission: RE | Admit: 2021-11-13 | Discharge: 2021-11-13 | Disposition: A | Discharge status: Home / Self Care | Payer: Medicare HMO | Source: Ambulatory Visit | Attending: Nurse Practitioner | Admitting: Nurse Practitioner

## 2021-11-13 DIAGNOSIS — N6320 Unspecified lump in the left breast, unspecified quadrant: Secondary | ICD-10-CM

## 2021-11-13 DIAGNOSIS — N6321 Unspecified lump in the left breast, upper outer quadrant: Secondary | ICD-10-CM | POA: Diagnosis not present

## 2021-11-13 DIAGNOSIS — D242 Benign neoplasm of left breast: Secondary | ICD-10-CM | POA: Diagnosis not present

## 2021-11-13 DIAGNOSIS — N6323 Unspecified lump in the left breast, lower outer quadrant: Secondary | ICD-10-CM | POA: Diagnosis not present

## 2021-11-13 DIAGNOSIS — N6012 Diffuse cystic mastopathy of left breast: Secondary | ICD-10-CM | POA: Diagnosis not present

## 2021-11-18 DIAGNOSIS — H26491 Other secondary cataract, right eye: Secondary | ICD-10-CM | POA: Diagnosis not present

## 2021-11-20 ENCOUNTER — Ambulatory Visit
Admission: RE | Admit: 2021-11-20 | Discharge: 2021-11-20 | Disposition: A | Discharge status: Home / Self Care | Payer: Medicare HMO | Source: Ambulatory Visit | Attending: Nurse Practitioner | Admitting: Nurse Practitioner

## 2021-11-20 ENCOUNTER — Other Ambulatory Visit: Payer: Self-pay | Admitting: Nurse Practitioner

## 2021-11-20 DIAGNOSIS — D0512 Intraductal carcinoma in situ of left breast: Secondary | ICD-10-CM | POA: Diagnosis not present

## 2021-11-20 DIAGNOSIS — N6320 Unspecified lump in the left breast, unspecified quadrant: Secondary | ICD-10-CM

## 2021-11-20 DIAGNOSIS — R928 Other abnormal and inconclusive findings on diagnostic imaging of breast: Secondary | ICD-10-CM | POA: Diagnosis not present

## 2021-11-21 ENCOUNTER — Telehealth: Payer: Self-pay | Admitting: Hematology and Oncology

## 2021-11-21 NOTE — Telephone Encounter (Signed)
Spoke to patient to confirm afternoon clinic appointment for 9/20, patient will pick up packet

## 2021-11-24 ENCOUNTER — Encounter: Payer: Self-pay | Admitting: *Deleted

## 2021-11-24 DIAGNOSIS — C50512 Malignant neoplasm of lower-outer quadrant of left female breast: Secondary | ICD-10-CM | POA: Insufficient documentation

## 2021-11-25 ENCOUNTER — Encounter: Payer: Self-pay | Admitting: *Deleted

## 2021-11-25 NOTE — Progress Notes (Signed)
Radiation Oncology         [phone] ________________________________  Initial Outpatient Consultation  Name: Amber Bowman MRN: [id number]  Date: 11/26/2021  DOB: [birth year]  AS:[id number], Dibas, MD  Jovita Kussmaul, MD   REFERRING PHYSICIAN: Autumn Messing III, MD  DIAGNOSIS: No diagnosis found.  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  CHIEF COMPLAINT: Here to discuss management of left breast cancer  HISTORY OF PRESENT ILLNESS::Amber Bowman is a 66 y.o. female who presented with a left breast abnormality on the following imaging: bilateral screening mammogram on the date of 10/27/21.  No symptoms, if any, were reported at that time. Left breast diagnostic mammogram and left breast ultrasound on 11/06/21 further revealed a irregular mass in the 2 o'clock position of the left breast concerning for malignancy, measuring 5 mm, and situated in an area of architectural distortion. A 10 mm indeterminate mass in the 3 o'clock position of the left breast was also appreciated. No abnormal left axillary lymph nodes were appreciated.   Biopsy of the 2 o'clock left breast on 11/13/21 showed no evidence of malignancy, and findings consistent with a fibroadenoma and fibrocystic changes. Biopsy of the 3 o'clock left breast was also negative for malignancy, showing fibrocystic changes and focal apocrine metaplasia. No lymph nodes were examined.    Stereotatic guided biopsy of the lower outer left breast distortion on date of 11/20/21 showed grade 1 invasive ductal carcinoma measuring 4 mm in the greatest linear extent with intermediate grade DCIS .  ER status: 98% positive with strong staining intensity; PR status 0% negative; Proliferation marker Ki67 at 10%; Her2 status negative; Grade 1. No lymph nodes were examined.   ***  PREVIOUS RADIATION THERAPY:  No  PAST MEDICAL HISTORY:  has a past medical history of Colon polyps (2014), Condyloma, Depression, Epilepsy ([place]), Osteopenia (04/2018), and Seizures ([place]).    PAST SURGICAL HISTORY: Past Surgical History:  Procedure Laterality Date   CATARACT EXTRACTION  03/2015   CYSTOSCOPY W/ RETROGRADES Right 06/25/2021   Procedure: CYSTOSCOPY WITH RIGHT RETROGRADE PYELOGRAM, REMOVAL OF BLADDER STONE;  Surgeon: Raynelle Bring, MD;  Location: [facility];  Service: Urology;  Laterality: Right;   CYSTOSCOPY/URETEROSCOPY/HOLMIUM LASER/STENT PLACEMENT Left 09/04/2019   Procedure: CYSTOSCOPY/RETROGRADE/ URETEROSCOPY/HOLMIUM LASER/STENT PLACEMENT/ REMOVAL OF NEPHROSTOMY TUBE;  Surgeon: Raynelle Bring, MD;  Location: [facility];  Service: Urology;  Laterality: Left;   IR NEPHROSTOMY EXCHANGE LEFT  08/17/2019   IR NEPHROSTOMY PLACEMENT LEFT  06/28/2019    FAMILY HISTORY: family history includes Diabetes in her mother and sister; Heart disease in her father, mother, and sister; Hypertension in her father, mother, and sister.  SOCIAL HISTORY:  reports that she has never smoked. She has never used smokeless tobacco. She reports that she does not drink alcohol and does not use drugs.  ALLERGIES: Patient has no known allergies.  MEDICATIONS:  Current Outpatient Medications  Medication Sig Dispense Refill   aspirin 81 MG EC tablet 1 tablet     Black Pepper-Turmeric (TURMERIC CURCUMIN) 07-998 MG CAPS 1 capsule     cefdinir (OMNICEF) 300 MG capsule Take 1 capsule (300 mg total) by mouth 2 (two) times daily. 20 capsule 0   Cranberry-Cholecalciferol (SUPER CRANBERRY/VITAMIN D3 PO) Take 1 tablet by mouth in the morning and at bedtime.     felbamate (FELBATOL) 600 MG tablet Take 0.5 tablets (  300 mg total) by mouth 2 (two) times daily. 90 tablet 1   ibuprofen (ADVIL) 800 MG tablet Take by mouth.     levETIRAcetam (KEPPRA) 500 MG tablet TAKE 1 TABLET BY MOUTH TWICE A DAY (Patient taking differently: 750 mg.) 180 tablet 0    mometasone (ELOCON) 0.1 % ointment See admin instructions.     Multiple Vitamins-Minerals (ICAPS AREDS FORMULA PO) Take 1 capsule by mouth in the morning and at bedtime.      Omega-3 Fatty Acids (FISH OIL) 1200 MG CAPS 1 capsule     topiramate (TOPAMAX) 200 MG tablet TAKE 1 TABLET BY MOUTH TWICE A DAY (Patient taking differently: Take 200 mg by mouth 2 (two) times daily.) 180 tablet 3   traMADol (ULTRAM) 50 MG tablet Take 1-2 tablets (50-100 mg total) by mouth every 6 (six) hours as needed (pain). 15 tablet 0   valACYclovir (VALTREX) 500 MG tablet 1 tablet     zonisamide (ZONEGRAN) 100 MG capsule 1 capsule     No current facility-administered medications for this encounter.    REVIEW OF SYSTEMS: As above in HPI.   PHYSICAL EXAM:  vitals were not taken for this visit.   General: Alert and oriented, in no acute distress HEENT: Head is normocephalic. Extraocular movements are intact. Oropharynx is clear. Neck: Neck is supple, no palpable cervical or supraclavicular lymphadenopathy. Heart: Regular in rate and rhythm with no murmurs, rubs, or gallops. Chest: Clear to auscultation bilaterally, with no rhonchi, wheezes, or rales. Abdomen: Soft, nontender, nondistended, with no rigidity or guarding. Extremities: No cyanosis or edema. Lymphatics: see Neck Exam Skin: No concerning lesions. Musculoskeletal: symmetric strength and muscle tone throughout. Neurologic: Cranial nerves II through XII are grossly intact. No obvious focalities. Speech is fluent. Coordination is intact. Psychiatric: Judgment and insight are intact. Affect is appropriate. Breasts: *** . No other palpable masses appreciated in the breasts or axillae *** .    ECOG = ***  0 - Asymptomatic (Fully active, able to carry on all predisease activities without restriction)  1 - Symptomatic but completely ambulatory (Restricted in physically strenuous activity but ambulatory and able to carry out work of a light or sedentary  nature. For example, light housework, office work)  2 - Symptomatic, <50% in bed during the day (Ambulatory and capable of all self care but unable to carry out any work activities. Up and about more than 50% of waking hours)  3 - Symptomatic, >50% in bed, but not bedbound (Capable of only limited self-care, confined to bed or chair 50% or more of waking hours)  4 - Bedbound (Completely disabled. Cannot carry on any self-care. Totally confined to bed or chair)  5 - Death   Eustace Pen MM, Creech RH, Tormey DC, et al. [phone]). "Toxicity and response criteria of the [facility]". [place] Oncol. 5 (6): 649-55   LABORATORY DATA:  Lab Results  Component Value Date   WBC 8.6 08/25/2019   HGB 15.6 (H) 06/25/2021   HCT 46.0 06/25/2021   MCV 95.8 08/25/2019   PLT 380 08/25/2019   CMP     Component Value Date/Time   NA 138 06/25/2021 1414   NA 143 11/15/2018 0954   K 3.7 06/25/2021 1414   CL 108 06/25/2021 1414   CO2 23 08/25/2019 1035   GLUCOSE 104 (H) 06/25/2021 1414   BUN 29 (H) 06/25/2021 1414   BUN 15 11/15/2018 0954   CREATININE 1.60 (H) 06/25/2021 1414   CALCIUM 9.0  08/25/2019 1035   PROT 5.8 (L) 07/06/2019 0314   PROT 7.0 11/15/2018 0954   ALBUMIN 2.3 (L) 07/11/2019 0409   ALBUMIN 4.4 11/15/2018 0954   AST 19 07/06/2019 0314   ALT 23 07/06/2019 0314   ALKPHOS 189 (H) 07/06/2019 0314   BILITOT 0.7 07/06/2019 0314   BILITOT 0.2 11/15/2018 0954   GFRNONAA >60 08/25/2019 1035   GFRAA >60 08/25/2019 1035         RADIOGRAPHY: MM LT BREAST BX W LOC DEV 1ST LESION IMAGE BX SPEC STEREO GUIDE  Addendum Date: 11/21/2021   ADDENDUM REPORT: 11/21/2021 13:51 ADDENDUM: Pathology revealed Breast, LEFT, needle core biopsy, lower outer (venus clip)- GRADE 1 INVASIVE MAMMARY CARCINOMA, MAMMARY CARCINOMA IN SITU, INTERMEDIATE GRADE. This was found to be concordant by Dr. Franki Cabot. Pathology results were discussed with the patient by telephone. The patient  reported doing well after the biopsy with tenderness at the site. Post biopsy instructions and care were reviewed and questions were answered. The patient was encouraged to call [facility] for any additional concerns. The patient was referred to [facility] on November 26, 2021. Pathology results reported by Stacie Acres RN on 11/21/2021. Electronically Signed   By: Franki Cabot M.D.   On: 11/21/2021 13:51   Result Date: 11/21/2021 CLINICAL DATA:  Patient presents today for stereotactic biopsy of an architectural distortion in the lower outer quadrant of the LEFT breast. EXAM: LEFT BREAST STEREOTACTIC CORE NEEDLE BIOPSY COMPARISON:  Previous exam(s). FINDINGS: The patient and I discussed the procedure of stereotactic-guided biopsy including benefits and alternatives. We discussed the high likelihood of a successful procedure. We discussed the risks of the procedure including infection, bleeding, tissue injury, clip migration, and inadequate sampling. Informed written consent was given. The usual time out protocol was performed immediately prior to the procedure. Using sterile technique and 1% Lidocaine as local anesthetic, under stereotactic guidance, a 9 gauge vacuum assisted device was used to perform core needle biopsy of the architectural distortion in the lower outer quadrant of the left breast using a inferior approach. Lesion quadrant: Lower outer quadrant At the conclusion of the procedure, Venus tissue marker clip was deployed into the biopsy cavity. Follow-up 2-view mammogram was performed and dictated separately. IMPRESSION: Stereotactic-guided biopsy of architectural distortion within the lower outer quadrant of the LEFT breast. No apparent complications. Electronically Signed: By: Franki Cabot M.D. On: 11/20/2021 13:25  MM CLIP PLACEMENT LEFT  Result Date: 11/20/2021 CLINICAL DATA:  Status  post stereotactic biopsy for architectural distortion in the lower outer quadrant of the LEFT breast. EXAM: 3D DIAGNOSTIC LEFT MAMMOGRAM POST STEREOTACTIC BIOPSY COMPARISON:  Previous exam(s). FINDINGS: 3D Mammographic images were obtained following stereotactic guided biopsy of architectural distortion within the lower outer quadrant the LEFT breast. The biopsy marking clip is in expected position at the site of biopsy. IMPRESSION: Appropriate positioning of the Venus biopsy marking clip at the site of biopsy in the lower outer quadrant of the LEFT breast at the site of the targeted architectural distortion. Final Assessment: Post Procedure Mammograms for Marker Placement Electronically Signed   By: Franki Cabot M.D.   On: 11/20/2021 13:27  [place] LT BREAST BX W LOC DEV 1ST LESION IMG BX SPEC US GUIDE  Addendum Date: 11/17/2021   ADDENDUM REPORT: 11/17/2021 13:14 ADDENDUM: Pathology revealed FIBROADENOMA, FIBROCYSTIC CHANGES of the LEFT breast, 2 o'clock, 5cmfn, (ribbon clip). This was found to be concordant  by Dr. Ammie Ferrier. Pathology revealed FIBROCYSTIC CHANGES WITH FOCAL APOCRINE METAPLASIA of the LEFT breast, 3 o'clock, 2cmfn, (coil clip). This was found to be concordant by Dr. Ammie Ferrier. Pathology results were discussed with the patient by telephone. The patient reported doing well after the biopsies with tenderness at the sites. Post biopsy instructions and care were reviewed and questions were answered. The patient was encouraged to call [facility] for any additional concerns. My direct phone number was provided. The patient is scheduled for a LEFT stereotatic guided biopsy on November 26, 2021. Further recommendations will be guided by the results of this biopsy. Pathology results reported by Terie Purser, RN on 11/14/2021. Electronically Signed   By: Ammie Ferrier M.D.   On: 11/17/2021 13:14   Result Date: 11/17/2021 CLINICAL DATA:  66 year old female  presenting for ultrasound-guided biopsy of 2 left breast masses. EXAM: ULTRASOUND GUIDED LEFT BREAST CORE NEEDLE BIOPSY COMPARISON:  Previous exam(s). PROCEDURE: I met with the patient and we discussed the procedure of ultrasound-guided biopsy, including benefits and alternatives. We discussed the high likelihood of a successful procedure. We discussed the risks of the procedure, including infection, bleeding, tissue injury, clip migration, and inadequate sampling. Informed written consent was given. The usual time-out protocol was performed immediately prior to the procedure. Lesion quadrant: Upper outer quadrant Using sterile technique and 1% Lidocaine as local anesthetic, under direct ultrasound visualization, a 14 gauge spring-loaded device was used to perform biopsy of a mass in the left breast at 2 o'clock, 5 cm from the nipple using an inferior approach. At the conclusion of the procedure a ribbon shaped tissue marker clip was deployed into the biopsy cavity. Lesion quadrant: Lower outer quadrant Using sterile technique and 1% Lidocaine as local anesthetic, under direct ultrasound visualization, a 14 gauge spring-loaded device was used to perform biopsy of a mass in the left breast at 3 o'clock, 2 cm from the nipple using a lateral approach. At the conclusion of the procedure a coil shaped tissue marker clip was deployed into the biopsy cavity. Follow up 2 view mammogram was performed and dictated separately. IMPRESSION: 1. Ultrasound guided biopsy of a left breast mass at 2 o'clock (ribbon clip). No apparent complications. 2. Ultrasound-guided biopsy of a left breast mass at 3 o'clock (coil clip). No apparent complications. Electronically Signed: By: Ammie Ferrier M.D. On: 11/13/2021 09:20  [place] LT BREAST BX W LOC DEV EA ADD LESION IMG BX SPEC US GUIDE  Addendum Date: 11/17/2021   ADDENDUM REPORT: 11/17/2021 13:14 ADDENDUM: Pathology revealed FIBROADENOMA, FIBROCYSTIC CHANGES of the LEFT breast, 2  o'clock, 5cmfn, (ribbon clip). This was found to be concordant by Dr. Ammie Ferrier. Pathology revealed FIBROCYSTIC CHANGES WITH FOCAL APOCRINE METAPLASIA of the LEFT breast, 3 o'clock, 2cmfn, (coil clip). This was found to be concordant by Dr. Ammie Ferrier. Pathology results were discussed with the patient by telephone. The patient reported doing well after the biopsies with tenderness at the sites. Post biopsy instructions and care were reviewed and questions were answered. The patient was encouraged to call [facility] for any additional concerns. My direct phone number was provided. The patient is scheduled for a LEFT stereotatic guided biopsy on November 26, 2021. Further recommendations will be guided by the results of this biopsy. Pathology results reported by Terie Purser, RN on 11/14/2021. Electronically Signed   By: Ammie Ferrier M.D.   On: 11/17/2021 13:14   Result Date: 11/17/2021 CLINICAL DATA:  66 year old female presenting for ultrasound-guided biopsy of 2 left breast masses. EXAM: ULTRASOUND GUIDED LEFT BREAST CORE NEEDLE BIOPSY COMPARISON:  Previous exam(s). PROCEDURE: I met with the patient and we discussed the procedure of ultrasound-guided biopsy, including benefits and alternatives. We discussed the high likelihood of a successful procedure. We discussed the risks of the procedure, including infection, bleeding, tissue injury, clip migration, and inadequate sampling. Informed written consent was given. The usual time-out protocol was performed immediately prior to the procedure. Lesion quadrant: Upper outer quadrant Using sterile technique and 1% Lidocaine as local anesthetic, under direct ultrasound visualization, a 14 gauge spring-loaded device was used to perform biopsy of a mass in the left breast at 2 o'clock, 5 cm from the nipple using an inferior approach. At the conclusion of the procedure a ribbon shaped tissue marker clip was deployed into the  biopsy cavity. Lesion quadrant: Lower outer quadrant Using sterile technique and 1% Lidocaine as local anesthetic, under direct ultrasound visualization, a 14 gauge spring-loaded device was used to perform biopsy of a mass in the left breast at 3 o'clock, 2 cm from the nipple using a lateral approach. At the conclusion of the procedure a coil shaped tissue marker clip was deployed into the biopsy cavity. Follow up 2 view mammogram was performed and dictated separately. IMPRESSION: 1. Ultrasound guided biopsy of a left breast mass at 2 o'clock (ribbon clip). No apparent complications. 2. Ultrasound-guided biopsy of a left breast mass at 3 o'clock (coil clip). No apparent complications. Electronically Signed: By: Ammie Ferrier M.D. On: 11/13/2021 09:20  MM CLIP PLACEMENT LEFT  Result Date: 11/13/2021 CLINICAL DATA:  Post biopsy mammogram of the left breast for clip placement. EXAM: 3D DIAGNOSTIC LEFT MAMMOGRAM POST ULTRASOUND BIOPSY COMPARISON:  Previous exam(s). FINDINGS: 3D Mammographic images were obtained following ultrasound guided biopsy of 2 masses in the left breast. The biopsy marking clips are in expected position at the sites of biopsy. However, neither of these 2 clips align with the area of distortion which lies further inferior to the ribbon shaped biopsy marking clip and lateral to the coil shaped biopsy marking clip. The patient was taken back to ultrasound to again determine if there is a corresponding lesion to the area of distortion. No definite correlate was found, therefore stereotactic biopsy is recommended. IMPRESSION: 1. Appropriate positioning of the ribbon shaped biopsy marking clip at the site of biopsy in the upper-outer left breast. 2. Appropriate positioning of the coil shaped biopsy marking clip in the lateral left breast. 3. Neither of the clips align with the area of distortion seen mammographically. Therefore, the patient will return for a stereotactic biopsy of the distortion.  This has been scheduled for 11/26/2021 at 7:30 a.m. Final Assessment: Post Procedure Mammograms for Marker Placement Electronically Signed   By: Ammie Ferrier M.D.   On: 11/13/2021 10:34  MM DIAG BREAST TOMO UNI LEFT  Result Date: 11/06/2021 CLINICAL DATA:  Possible distortion in the outer left breast on a recent screening mammogram. EXAM: DIGITAL DIAGNOSTIC UNILATERAL LEFT MAMMOGRAM WITH TOMOSYNTHESIS; ULTRASOUND LEFT BREAST LIMITED TECHNIQUE: Left digital diagnostic mammography and breast tomosynthesis was performed.; Targeted ultrasound examination of the left breast was performed. COMPARISON:  Previous exam(s). ACR Breast Density Category c: The breast tissue is heterogeneously dense, which may obscure small masses. FINDINGS: 3D tomographic and 2D generated spot-compression images of the left breast confirm a mass-like area of focal distortion in the outer left breast in approximately the 2 o'clock position, middle depth. There is also  an oval mass-like density in the anterior outer left breast with some circumscribed and some indistinct or obscured margins. On physical exam, no mass palpable in the medial left breast or left axilla. Targeted ultrasound is performed, showing an irregular, hypoechoic mass with posterior acoustical shadowing in the 2 o'clock position of the left breast, 5 cm from the nipple. This measures 5 x 4 x 4 mm and is in the expected position of the area of mammographic distortion. No internal blood flow was seen with power Doppler. There is also a 10 x 10 x 5 mm oval, circumscribed, hypoechoic mass in the 3 o'clock position of the left breast, 2 cm from the nipple, corresponding to the more anterior mass seen mammographically. This is horizontally oriented and has some margins that are mildly irregular. This exhibits mild posterior acoustical shadowing. No internal blood flow seen with power Doppler. Ultrasound of the left axilla demonstrated no left axillary lymph nodes with  abnormal cortical thickening. IMPRESSION: 1. 5 mm irregular mass in the 2 o'clock position of the left breast in an area of architectural distortion. These findings are suspicious for malignancy. 2. 10 mm indeterminate mass in the 3 o'clock position of the left breast. RECOMMENDATION: 1. Ultrasound-guided core needle biopsy of the 5 mm mass in the 2 o'clock position of the left breast with close correlation on the post biopsy clip placement mammogram images to determine if this corresponds to the area of distortion seen mammographically. 2. Ultrasound-guided core needle biopsy of the 10 mm mass in the 3 o'clock position of the left breast. This has been discussed with the patient and her husband and the biopsies have been scheduled at 8:30 a.m. on 11/13/2021. I have discussed the findings and recommendations with the patient. If applicable, a reminder letter will be sent to the patient regarding the next appointment. BI-RADS CATEGORY  4: Suspicious. Electronically Signed   By: Claudie Revering M.D.   On: 11/06/2021 17:06  US BREAST LTD UNI LEFT INC AXILLA  Result Date: 11/06/2021 CLINICAL DATA:  Possible distortion in the outer left breast on a recent screening mammogram. EXAM: DIGITAL DIAGNOSTIC UNILATERAL LEFT MAMMOGRAM WITH TOMOSYNTHESIS; ULTRASOUND LEFT BREAST LIMITED TECHNIQUE: Left digital diagnostic mammography and breast tomosynthesis was performed.; Targeted ultrasound examination of the left breast was performed. COMPARISON:  Previous exam(s). ACR Breast Density Category c: The breast tissue is heterogeneously dense, which may obscure small masses. FINDINGS: 3D tomographic and 2D generated spot-compression images of the left breast confirm a mass-like area of focal distortion in the outer left breast in approximately the 2 o'clock position, middle depth. There is also an oval mass-like density in the anterior outer left breast with some circumscribed and some indistinct or obscured margins. On physical  exam, no mass palpable in the medial left breast or left axilla. Targeted ultrasound is performed, showing an irregular, hypoechoic mass with posterior acoustical shadowing in the 2 o'clock position of the left breast, 5 cm from the nipple. This measures 5 x 4 x 4 mm and is in the expected position of the area of mammographic distortion. No internal blood flow was seen with power Doppler. There is also a 10 x 10 x 5 mm oval, circumscribed, hypoechoic mass in the 3 o'clock position of the left breast, 2 cm from the nipple, corresponding to the more anterior mass seen mammographically. This is horizontally oriented and has some margins that are mildly irregular. This exhibits mild posterior acoustical shadowing. No internal blood flow seen with power Doppler.  Ultrasound of the left axilla demonstrated no left axillary lymph nodes with abnormal cortical thickening. IMPRESSION: 1. 5 mm irregular mass in the 2 o'clock position of the left breast in an area of architectural distortion. These findings are suspicious for malignancy. 2. 10 mm indeterminate mass in the 3 o'clock position of the left breast. RECOMMENDATION: 1. Ultrasound-guided core needle biopsy of the 5 mm mass in the 2 o'clock position of the left breast with close correlation on the post biopsy clip placement mammogram images to determine if this corresponds to the area of distortion seen mammographically. 2. Ultrasound-guided core needle biopsy of the 10 mm mass in the 3 o'clock position of the left breast. This has been discussed with the patient and her husband and the biopsies have been scheduled at 8:30 a.m. on 11/13/2021. I have discussed the findings and recommendations with the patient. If applicable, a reminder letter will be sent to the patient regarding the next appointment. BI-RADS CATEGORY  4: Suspicious. Electronically Signed   By: Claudie Revering M.D.   On: 11/06/2021 17:06  MM 3D SCREEN BREAST BILATERAL  Result Date: 10/28/2021 CLINICAL  DATA:  Screening. EXAM: DIGITAL SCREENING BILATERAL MAMMOGRAM WITH TOMOSYNTHESIS AND CAD TECHNIQUE: Bilateral screening digital craniocaudal and mediolateral oblique mammograms were obtained. Bilateral screening digital breast tomosynthesis was performed. The images were evaluated with computer-aided detection. COMPARISON:  Previous exam(s). ACR Breast Density Category c: The breast tissue is heterogeneously dense, which may obscure small masses. FINDINGS: In the left breast, possible distortion warrants further evaluation. In the right breast, no findings suspicious for malignancy. IMPRESSION: Further evaluation is suggested for possible distortion in the left breast. RECOMMENDATION: Diagnostic mammogram and possibly ultrasound of the left breast. (Code:[id number]-L-[id number]) The patient will be contacted regarding the findings, and additional imaging will be scheduled. BI-RADS CATEGORY  0: Incomplete. Need additional imaging evaluation and/or prior mammograms for comparison. Electronically Signed   By: Lovey Newcomer M.D.   On: 10/28/2021 07:09      IMPRESSION/PLAN: ***   It was a pleasure meeting the patient today. We discussed the risks, benefits, and side effects of radiotherapy. I recommend radiotherapy to the *** to reduce her risk of locoregional recurrence by 2/3.  We discussed that radiation would take approximately *** weeks to complete and that I would give the patient a few weeks to heal following surgery before starting treatment planning. *** If chemotherapy were to be given, this would precede radiotherapy. We spoke about acute effects including skin irritation and fatigue as well as much less common late effects including internal organ injury or irritation. We spoke about the latest technology that is used to minimize the risk of late effects for patients undergoing radiotherapy to the breast or chest wall. No guarantees of treatment were given. The patient is enthusiastic about proceeding with treatment. I  look forward to participating in the patient's care.  I will await her referral back to me for postoperative follow-up and eventual CT simulation/treatment planning.  On date of service, in total, I spent *** minutes on this encounter. Patient was seen in person.   __________________________________________   Eppie Gibson, MD  This document serves as a record of services personally performed by Eppie Gibson, MD. It was created on her behalf by Roney Mans, a trained medical scribe. The creation of this record is based on the scribe's personal observations and the provider's statements to them. This document has been checked and approved by the attending provider.

## 2021-11-26 ENCOUNTER — Ambulatory Visit: Payer: Self-pay | Admitting: Genetic Counselor

## 2021-11-26 ENCOUNTER — Encounter: Payer: Self-pay | Admitting: Radiation Oncology

## 2021-11-26 ENCOUNTER — Other Ambulatory Visit: Payer: Self-pay

## 2021-11-26 ENCOUNTER — Inpatient Hospital Stay: Payer: Medicare HMO | Attending: Hematology and Oncology | Admitting: Hematology and Oncology

## 2021-11-26 ENCOUNTER — Ambulatory Visit
Admission: RE | Admit: 2021-11-26 | Discharge: 2021-11-26 | Disposition: A | Discharge status: Home / Self Care | Payer: Medicare HMO | Source: Ambulatory Visit | Attending: Radiation Oncology | Admitting: Radiation Oncology

## 2021-11-26 ENCOUNTER — Encounter: Payer: Self-pay | Admitting: *Deleted

## 2021-11-26 ENCOUNTER — Ambulatory Visit: Payer: Medicare HMO | Attending: General Surgery | Admitting: Physical Therapy

## 2021-11-26 ENCOUNTER — Ambulatory Visit: Payer: Self-pay | Admitting: General Surgery

## 2021-11-26 ENCOUNTER — Encounter: Payer: Self-pay | Admitting: Physical Therapy

## 2021-11-26 ENCOUNTER — Inpatient Hospital Stay: Payer: Medicare HMO

## 2021-11-26 DIAGNOSIS — C50512 Malignant neoplasm of lower-outer quadrant of left female breast: Secondary | ICD-10-CM

## 2021-11-26 DIAGNOSIS — R293 Abnormal posture: Secondary | ICD-10-CM | POA: Insufficient documentation

## 2021-11-26 DIAGNOSIS — Z170 Estrogen receptor positive status [ER+]: Secondary | ICD-10-CM | POA: Insufficient documentation

## 2021-11-26 DIAGNOSIS — C50412 Malignant neoplasm of upper-outer quadrant of left female breast: Secondary | ICD-10-CM | POA: Insufficient documentation

## 2021-11-26 LAB — CBC WITH DIFFERENTIAL (CANCER CENTER ONLY)
Abs Immature Granulocytes: 0.02 10*3/uL (ref 0.00–0.07)
Basophils Absolute: 0.1 10*3/uL (ref 0.0–0.1)
Basophils Relative: 1 %
Eosinophils Absolute: 0.1 10*3/uL (ref 0.0–0.5)
Eosinophils Relative: 2 %
HCT: 39.2 % (ref 36.0–46.0)
Hemoglobin: 12.8 g/dL (ref 12.0–15.0)
Immature Granulocytes: 0 %
Lymphocytes Relative: 38 %
Lymphs Abs: 2.6 10*3/uL (ref 0.7–4.0)
MCH: 30 pg (ref 26.0–34.0)
MCHC: 32.7 g/dL (ref 30.0–36.0)
MCV: 92 fL (ref 80.0–100.0)
Monocytes Absolute: 0.7 10*3/uL (ref 0.1–1.0)
Monocytes Relative: 11 %
Neutro Abs: 3.3 10*3/uL (ref 1.7–7.7)
Neutrophils Relative %: 48 %
Platelet Count: 269 10*3/uL (ref 150–400)
RBC: 4.26 MIL/uL (ref 3.87–5.11)
RDW: 13.6 % (ref 11.5–15.5)
WBC Count: 6.8 10*3/uL (ref 4.0–10.5)
nRBC: 0 % (ref 0.0–0.2)

## 2021-11-26 LAB — CMP (CANCER CENTER ONLY)
ALT: 13 U/L (ref 0–44)
AST: 14 U/L — ABNORMAL LOW (ref 15–41)
Albumin: 3.9 g/dL (ref 3.5–5.0)
Alkaline Phosphatase: 114 U/L (ref 38–126)
Anion gap: 5 (ref 5–15)
BUN: 15 mg/dL (ref 8–23)
CO2: 25 mmol/L (ref 22–32)
Calcium: 9 mg/dL (ref 8.9–10.3)
Chloride: 112 mmol/L — ABNORMAL HIGH (ref 98–111)
Creatinine: 0.95 mg/dL (ref 0.44–1.00)
GFR, Estimated: >60 mL/min (ref >60)
Glucose, Bld: 97 mg/dL (ref 70–99)
Potassium: 4.5 mmol/L (ref 3.5–5.1)
Sodium: 142 mmol/L (ref 135–145)
Total Bilirubin: 0.3 mg/dL (ref 0.3–1.2)
Total Protein: 6.9 g/dL (ref 6.5–8.1)

## 2021-11-26 LAB — GENETIC SCREENING ORDER

## 2021-11-26 NOTE — Progress Notes (Signed)
Ms. Dittmer was seen by a genetic counselor during the breast multidisciplinary clinic on November 26, 2021. In addition to her personal history of breast cancer, she reported a family history of skin cancer in a paternal uncle. She does not meet NCCN criteria for genetic testing at this time. She was still offered genetic counseling and testing but declined. We encourage her to contact us if there are any changes to her personal or family history of cancer. If she meets NCCN criteria based on the updated personal/family history, she would be recommended to have genetic counseling and testing.

## 2021-11-26 NOTE — Progress Notes (Signed)
[place] Psychosocial Distress Screening Spiritual Care  Met with Amber Bowman and her husband, in [facility] to introduce [facility] team/resources, reviewing distress screen per protocol.  The patient scored a 4 on the Psychosocial Distress Thermometer which indicates mild distress. Also assessed for distress and other psychosocial needs.      11/26/2021    3:46 PM  ONCBCN DISTRESS SCREENING  Distress experienced in past week (1-10) 4  Emotional problem type Nervousness/Anxiety  Information Concerns Type Lack of info about diagnosis;Lack of info about treatment;Lack of info about complementary therapy choices  Referral to support programs Yes  Other Most distressing symptom is nervousness.   Lysle Morales, counseling intern, met with patient and patient's husband. Both patient and husband expressed feelings of anxiety and depression before the appointment. They reported they felt much better after having all their questions answered about diagnosis and treatment.  The patient has the counselor's business card in case needs arise.  Follow up needed: No.  Lysle Morales  Counseling Intern

## 2021-11-26 NOTE — Assessment & Plan Note (Signed)
11/20/2021:Screening mammogram detected left breast distortion.  Ultrasound revealed 2 areas of abnormality both of which were biopsied and were found to be benign fibroadenoma and fibrocystic change.  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 After reviewing the pathology in detail, we proceeded to discuss the different treatment options between surgery, radiation, chemotherapy, antiestrogen therapies.  Recommendations: 1. Breast conserving surgery followed by 2. Oncotype DX testing to determine if chemotherapy would be of any benefit followed by 3. Adjuvant radiation therapy followed by 4. Adjuvant antiestrogen therapy  Oncotype counseling: I discussed Oncotype DX test. I explained to the patient that this is a 21 gene panel to evaluate patient tumors DNA to calculate recurrence score. This would help determine whether patient has high risk or low risk breast cancer. She understands that if her tumor was found to be high risk, she would benefit from systemic chemotherapy. If low risk, no need of chemotherapy.  Return to clinic after surgery to discuss final pathology report and then determine if Oncotype DX testing will need to be sent.

## 2021-11-26 NOTE — Therapy (Signed)
OUTPATIENT PHYSICAL THERAPY BREAST CANCER BASELINE EVALUATION   Patient Name: Manali Mcelmurry MRN: [id number] DOB:March 13, 1955, 65 y.o., female Today's Date: 11/26/2021   PT End of Session - 11/26/21 1343     Visit Number 1    Number of Visits 2    Date for PT Re-Evaluation 01/21/22    PT Start Time 8264    PT Stop Time 1583   Also saw pt from 0940 to 1426 for a total of 25 minutes   PT Time Calculation (min) 12 min    Activity Tolerance Patient tolerated treatment well    Behavior During Therapy [facility] for tasks assessed/performed             Past Medical History:  Diagnosis Date   Colon polyps 2014   Condyloma    Depression    Epilepsy ([place])    Osteopenia 04/2018   T score -1.8 FRAX 9.5% / 1.1% overall stable from prior DEXA   Seizures [facility])    Past Surgical History:  Procedure Laterality Date   CATARACT EXTRACTION  03/2015   CYSTOSCOPY W/ RETROGRADES Right 06/25/2021   Procedure: CYSTOSCOPY WITH RIGHT RETROGRADE PYELOGRAM, REMOVAL OF BLADDER STONE;  Surgeon: Raynelle Bring, MD;  Location: [facility];  Service: Urology;  Laterality: Right;   CYSTOSCOPY/URETEROSCOPY/HOLMIUM LASER/STENT PLACEMENT Left 09/04/2019   Procedure: CYSTOSCOPY/RETROGRADE/ URETEROSCOPY/HOLMIUM LASER/STENT PLACEMENT/ REMOVAL OF NEPHROSTOMY TUBE;  Surgeon: Raynelle Bring, MD;  Location: [facility];  Service: Urology;  Laterality: Left;   IR NEPHROSTOMY EXCHANGE LEFT  08/17/2019   IR NEPHROSTOMY PLACEMENT LEFT  06/28/2019   Patient Active Problem List   Diagnosis Date Noted   Malignant neoplasm of lower-outer quadrant of left breast of female, estrogen receptor positive ([place]) 76/80/8811   Chronic systolic CHF (congestive heart failure) ([place]) 07/09/2019   Overweight (BMI 25.0-29.9) 07/08/2019   COVID-19 virus infection 07/08/2019   Septic shock due to Escherichia coli ([place]) 07/08/2019   Acute pyelonephritis 07/08/2019   Unspecified mood (affective) disorder ([place]) 07/08/2019   Leukocytosis 07/08/2019    Thrombocytopenia ([place]) 07/08/2019   Acute renal failure (ARF) ([place]) 06/27/2019   Partial epilepsy with impairment of consciousness, intractable ([place]) 11/03/2013   Condyloma    Epilepsy ([place])    Depression     REFERRING PROVIDER: Dr. Autumn Messing  REFERRING DIAG: Left breast cancer  THERAPY DIAG:  Malignant neoplasm of upper-outer quadrant of left breast in female, estrogen receptor positive ([place])  Abnormal posture  Rationale for Evaluation and Treatment Rehabilitation  ONSET DATE: 10/27/2021  SUBJECTIVE  SUBJECTIVE STATEMENT: Patient reports she is here today to be seen by her medical team for her newly diagnosed left breast cancer.   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 It measures 1.7 cm of distortion and is located in the upper outer quadrant. It is ER positive, PR negative, and HER2 negative with a Ki67 of 10%. She also has epilepsy.  PATIENT GOALS   reduce lymphedema risk and learn post op HEP.   PAIN:  Are you having pain? No   PRECAUTIONS: Active CA Other: Epilepsy  HAND DOMINANCE: right  WEIGHT BEARING RESTRICTIONS No  FALLS:  Has patient fallen in last 6 months? No  LIVING ENVIRONMENT: Patient lives with: her partner Abe People Lives in: House/apartment Has following equipment at home: None  OCCUPATION: Retired  Psychologist, clinical: She walks 10,000 steps/day  PRIOR LEVEL OF FUNCTION: Independent   OBJECTIVE  COGNITION:  Overall cognitive status: Within functional limits for tasks assessed    POSTURE:  Forward head and rounded shoulders posture  UPPER EXTREMITY AROM/PROM:  A/PROM RIGHT   eval   Shoulder extension 48  Shoulder flexion 146  Shoulder abduction 142  Shoulder internal rotation 74   Shoulder external rotation 80    (Blank rows = not tested)  A/PROM LEFT   eval  Shoulder extension 45  Shoulder flexion 142  Shoulder abduction 145  Shoulder internal rotation 62  Shoulder external rotation 80    (Blank rows = not tested)   CERVICAL AROM: All within normal limits  UPPER EXTREMITY STRENGTH: WFL   LYMPHEDEMA ASSESSMENTS:   LANDMARK RIGHT   eval  10 cm proximal to olecranon process 27.3  Olecranon process 24  10 cm proximal to ulnar styloid process 21  Just proximal to ulnar styloid process 14.5  Across hand at thumb web space 18  At base of 2nd digit 6.1  (Blank rows = not tested)  LANDMARK LEFT   eval  10 cm proximal to olecranon process 25.2  Olecranon process 23.9  10 cm proximal to ulnar styloid process 19.4  Just proximal to ulnar styloid process 14.5  Across hand at thumb web space 17.5  At base of 2nd digit 5.7  (Blank rows = not tested)   L-DEX LYMPHEDEMA SCREENING:  The patient was assessed using the L-Dex machine today to produce a lymphedema index baseline score. The patient will be reassessed on a regular basis (typically every 3 months) to obtain new L-Dex scores. If the score is > 6.5 points away from his/her baseline score indicating onset of subclinical lymphedema, it will be recommended to wear a compression garment for 4 weeks, 12 hours per day and then be reassessed. If the score continues to be > 6.5 points from baseline at reassessment, we will initiate lymphedema treatment. Assessing in this manner has a 95% rate of preventing clinically significant lymphedema.   L-DEX FLOWSHEETS - 11/26/21 1300       L-DEX LYMPHEDEMA SCREENING   Measurement Type Unilateral    L-DEX MEASUREMENT EXTREMITY Upper Extremity    POSITION  Standing    DOMINANT SIDE Right    At Risk Side Left    BASELINE SCORE (UNILATERAL) -0.4              QUICK DASH SURVEY:  Katina Dung - 11/26/21 0001     Open a tight or new jar No difficulty    Do  heavy household chores (wash walls, wash floors) No difficulty  Carry a shopping bag or briefcase No difficulty    Wash your back No difficulty    Use a knife to cut food No difficulty    Recreational activities in which you take some force or impact through your arm, shoulder, or hand (golf, hammering, tennis) No difficulty    During the past week, to what extent has your arm, shoulder or hand problem interfered with your normal social activities with family, friends, neighbors, or groups? Not at all    During the past week, to what extent has your arm, shoulder or hand problem limited your work or other regular daily activities Not at all    Arm, shoulder, or hand pain. None    Tingling (pins and needles) in your arm, shoulder, or hand None    Difficulty Sleeping No difficulty    DASH Score 0 %              PATIENT EDUCATION:  Education details: Lymphedema risk reduction and post op shoulder/posture HEP Person educated: Patient Education method: Explanation, Demonstration, Handout Education comprehension: Patient verbalized understanding and returned demonstration   HOME EXERCISE PROGRAM: Patient was instructed today in a home exercise program today for post op shoulder range of motion. These included active assist shoulder flexion in sitting, scapular retraction, wall walking with shoulder abduction, and hands behind head external rotation.  She was encouraged to do these twice a day, holding 3 seconds and repeating 5 times when permitted by her physician.   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 It measures 1.7 cm of distortion and is located in the upper outer quadrant. It is ER positive, PR negative, and HER2 negative with a Ki67 of 10%. She also has epilepsy.Her multidisciplinary medical team met prior to her assessments to determine a recommended treatment plan. She is planning to have  a left lumpectomy and sentinel node biopsy followed by Oncotype testing, radiation, and anti-estrogen therapy. She will benefit from a post op PT reassessment to determine needs and from L-Dex screens every 3 months for 2 years to detect subclinical lymphedema.  Pt will benefit from skilled therapeutic intervention to improve on the following deficits: Decreased knowledge of precautions, impaired UE functional use, pain, decreased ROM, postural dysfunction.   PT treatment/interventions: ADL/self-care home management, pt/family education, therapeutic exercise  REHAB POTENTIAL: Excellent  CLINICAL DECISION MAKING: Stable/uncomplicated  EVALUATION COMPLEXITY: Low   GOALS: Goals reviewed with patient? YES  LONG TERM GOALS: (STG=LTG)    Name Target Date Goal status  1 Pt will be able to verbalize understanding of pertinent lymphedema risk reduction practices relevant to her dx specifically related to skin care.  Baseline:  No knowledge 11/26/2021 Achieved at eval  2 Pt will be able to return demo and/or verbalize understanding of the post op HEP related to regaining shoulder ROM. Baseline:  No knowledge 11/26/2021 Achieved at eval  3 Pt will be able to verbalize understanding of the importance of attending the post op After Breast CA Class for further lymphedema risk reduction education and therapeutic exercise.  Baseline:  No knowledge 11/26/2021 Achieved at eval  4 Pt will demo she has regained full shoulder ROM and function post operatively compared to baselines.  Baseline: See objective measurements taken today. 01/21/2022      PLAN: PT FREQUENCY/DURATION: EVAL and 1 follow up appointment.   PLAN FOR NEXT SESSION: will reassess 3-4 weeks post op to determine needs.   Patient  will follow up at outpatient cancer rehab 3-4 weeks following surgery.  If the patient requires physical therapy at that time, a specific plan will be dictated and sent to the referring physician for approval. The  patient was educated today on appropriate basic range of motion exercises to begin post operatively and the importance of attending the After Breast Cancer class following surgery.  Patient was educated today on lymphedema risk reduction practices as it pertains to recommendations that will benefit the patient immediately following surgery.  She verbalized good understanding.    Physical Therapy Information for After Breast Cancer Surgery/Treatment:  Lymphedema is a swelling condition that you may be at risk for in your arm if you have lymph nodes removed from the armpit area.  After a sentinel node biopsy, the risk is approximately 5-9% and is higher after an axillary node dissection.  There is treatment available for this condition and it is not life-threatening.  Contact your physician or physical therapist with concerns. You may begin the 4 shoulder/posture exercises (see additional sheet) when permitted by your physician (typically a week after surgery).  If you have drains, you may need to wait until those are removed before beginning range of motion exercises.  A general recommendation is to not lift your arms above shoulder height until drains are removed.  These exercises should be done to your tolerance and gently.  This is not a "no pain/no gain" type of recovery so listen to your body and stretch into the range of motion that you can tolerate, stopping if you have pain.  If you are having immediate reconstruction, ask your plastic surgeon about doing exercises as he or she may want you to wait. We encourage you to attend the free one time ABC (After Breast Cancer) class offered by [facility].  You will learn information related to lymphedema risk, prevention and treatment and additional exercises to regain mobility following surgery.  You can call [phone] for more information.  This is offered the 1st and 3rd Monday of each month.  You only attend the class one  time. While undergoing any medical procedure or treatment, try to avoid blood pressure being taken or needle sticks from occurring on the arm on the side of cancer.   This recommendation begins after surgery and continues for the rest of your life.  This may help reduce your risk of getting lymphedema (swelling in your arm). An excellent resource for those seeking information on lymphedema is the National Lymphedema Network's web site. It can be accessed at [place].org If you notice swelling in your hand, arm or breast at any time following surgery (even if it is many years from now), please contact your doctor or physical therapist to discuss this.  Lymphedema can be treated at any time but it is easier for you if it is treated early on.  If you feel like your shoulder motion is not returning to normal in a reasonable amount of time, please contact your surgeon or physical therapist.  [facility] [phone]. [address], [place] [address]  ABC CLASS After Breast Cancer Class  After Breast Cancer Class is a specially designed exercise class to assist you in a safe recover after having breast cancer surgery.  In this class you will learn how to get back to full function whether your drains were just removed or if you had surgery a month ago.  This one-time class is held  the 1st and 3rd Monday of every month from 11:00 a.m. until 12:00 noon virtually.  This class is FREE and space is limited. For more information or to register for the next available class, call [phone].  Class Goals  Understand specific stretches to improve the flexibility of you chest and shoulder. Learn ways to safely strengthen your upper body and improve your posture. Understand the warning signs of infection and why you may be at risk for an arm infection. Learn about Lymphedema and prevention.  ** You do not attend this class until after surgery.  Drains must  be removed to participate  Patient was instructed today in a home exercise program today for post op shoulder range of motion. These included active assist shoulder flexion in sitting, scapular retraction, wall walking with shoulder abduction, and hands behind head external rotation.  She was encouraged to do these twice a day, holding 3 seconds and repeating 5 times when permitted by her physician.  Annia Friendly, [place] 11/26/21 2:34 PM

## 2021-11-26 NOTE — Progress Notes (Signed)
[place] NOTE  Patient Care Team: Lujean Amel, MD as PCP - General (Family Medicine) Mauro Kaufmann, RN as Oncology Nurse Navigator Rockwell Germany, RN as Oncology Nurse Navigator Jovita Kussmaul, MD as Consulting Physician (General Surgery) Nicholas Lose, MD as Consulting Physician (Hematology and Oncology) Eppie Gibson, MD as Attending Physician (Radiation Oncology)  CHIEF COMPLAINTS/PURPOSE OF CONSULTATION:  Newly diagnosed breast cancer  HISTORY OF PRESENTING ILLNESS:  Amber Bowman 66 y.o. female is here because of recent diagnosis of left breast cancer.  She had a routine screening mammogram that detected left breast distortion.  Initially she had an ultrasound and 2 biopsies were performed which were both benign.  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  She was presented this morning in the multidisciplinary tumor board and she is here today to discuss her treatment plan.  I reviewed her records extensively and collaborated the history with the patient.  SUMMARY OF ONCOLOGIC HISTORY: Oncology History  Malignant neoplasm of lower-outer quadrant of left breast of female, estrogen receptor positive ([place])  11/20/2021 Initial Diagnosis   Screening mammogram detected left breast distortion.  Ultrasound revealed 2 areas of abnormality both of which were biopsied and were found to be benign fibroadenoma and fibrocystic change.  Stereotactic biopsy of the distortion revealed grade 1 IDC with DCIS ER 98%, PR 0%, Ki-67 10%, HER2 negative   11/26/2021 Cancer Staging   Staging form: Breast, AJCC 8th Edition - Clinical stage from 11/26/2021: Stage IA (cT1c, cN0, cM0, G1, ER+, PR-, HER2-) - Signed by Nicholas Lose, MD on 11/26/2021 Stage prefix: Initial diagnosis Histologic grading system: 3 grade system      MEDICAL HISTORY:   Past Medical History:  Diagnosis Date   Colon polyps 2014   Condyloma    Depression    Epilepsy ([place])    Osteopenia 04/2018   T score -1.8 FRAX 9.5% / 1.1% overall stable from prior DEXA   Seizures ([place])     SURGICAL HISTORY: Past Surgical History:  Procedure Laterality Date   CATARACT EXTRACTION  03/2015   CYSTOSCOPY W/ RETROGRADES Right 06/25/2021   Procedure: CYSTOSCOPY WITH RIGHT RETROGRADE PYELOGRAM, REMOVAL OF BLADDER STONE;  Surgeon: Raynelle Bring, MD;  Location: [facility];  Service: Urology;  Laterality: Right;   CYSTOSCOPY/URETEROSCOPY/HOLMIUM LASER/STENT PLACEMENT Left 09/04/2019   Procedure: CYSTOSCOPY/RETROGRADE/ URETEROSCOPY/HOLMIUM LASER/STENT PLACEMENT/ REMOVAL OF NEPHROSTOMY TUBE;  Surgeon: Raynelle Bring, MD;  Location: [facility];  Service: Urology;  Laterality: Left;   IR NEPHROSTOMY EXCHANGE LEFT  08/17/2019   IR NEPHROSTOMY PLACEMENT LEFT  06/28/2019    SOCIAL HISTORY: Social History   Socioeconomic History   Marital status: Legally Separated    Spouse name: Not on file   Number of children: 0   Years of education: 13   Highest education level: Not on file  Occupational History    Comment: Disabled.  Tobacco Use   Smoking status: Never   Smokeless tobacco: Never  Vaping Use   Vaping Use: Never used  Substance and Sexual Activity   Alcohol use: No    Alcohol/week: 0.0 standard drinks of alcohol   Drug use: No   Sexual activity: Not Currently    Partners: Male    Birth control/protection: Post-menopausal    Comment: DECLINED INSURANCE QUESTIONS  Other Topics Concern   Not on file  Social History Narrative   Patient lives  at home alone single.   Patient is disabled.   Education some college education.   Right handed.   Caffeine four cups of tea daily.   Social Determinants of Health   Financial Resource Strain: Not on file  Food Insecurity: Not on file  Transportation Needs: Not on file  Physical Activity: Not on file  Stress: Not on file   Social Connections: Not on file  Intimate Partner Violence: Not on file    FAMILY HISTORY: Family History  Problem Relation Age of Onset   Hypertension Mother    Diabetes Mother    Heart disease Mother    Hypertension Father    Heart disease Father    Diabetes Sister    Hypertension Sister    Heart disease Sister    Seizures Neg Hx     ALLERGIES:  has No Known Allergies.  MEDICATIONS:  Current Outpatient Medications  Medication Sig Dispense Refill   aspirin 81 MG EC tablet 1 tablet     Black Pepper-Turmeric (TURMERIC CURCUMIN) 07-998 MG CAPS 1 capsule     cefdinir (OMNICEF) 300 MG capsule Take 1 capsule (300 mg total) by mouth 2 (two) times daily. 20 capsule 0   Cranberry-Cholecalciferol (SUPER CRANBERRY/VITAMIN D3 PO) Take 1 tablet by mouth in the morning and at bedtime.     felbamate (FELBATOL) 600 MG tablet Take 0.5 tablets (300 mg total) by mouth 2 (two) times daily. 90 tablet 1   ibuprofen (ADVIL) 800 MG tablet Take by mouth.     levETIRAcetam (KEPPRA) 500 MG tablet TAKE 1 TABLET BY MOUTH TWICE A DAY (Patient taking differently: 750 mg.) 180 tablet 0   mometasone (ELOCON) 0.1 % ointment See admin instructions.     Multiple Vitamins-Minerals (ICAPS AREDS FORMULA PO) Take 1 capsule by mouth in the morning and at bedtime.      Omega-3 Fatty Acids (FISH OIL) 1200 MG CAPS 1 capsule     topiramate (TOPAMAX) 200 MG tablet TAKE 1 TABLET BY MOUTH TWICE A DAY (Patient taking differently: Take 200 mg by mouth 2 (two) times daily.) 180 tablet 3   traMADol (ULTRAM) 50 MG tablet Take 1-2 tablets (50-100 mg total) by mouth every 6 (six) hours as needed (pain). 15 tablet 0   valACYclovir (VALTREX) 500 MG tablet 1 tablet     zonisamide (ZONEGRAN) 100 MG capsule 1 capsule     No current facility-administered medications for this visit.    REVIEW OF SYSTEMS:   Constitutional: Denies fevers, chills or abnormal night sweats   All other systems were reviewed with the patient and are  negative.  PHYSICAL EXAMINATION: ECOG PERFORMANCE STATUS: 1 - Symptomatic but completely ambulatory  Vitals:   11/26/21 1240  BP: 126/65  Pulse: (!) 54  Resp: 18  Temp: 97.7 F (36.5 C)  SpO2: 100%   Filed Weights   11/26/21 1240  Weight: 164 lb 4.8 oz (74.5 kg)    GENERAL:alert, no distress and comfortable    LABORATORY DATA:  I have reviewed the data as listed Lab Results  Component Value Date   WBC 6.8 11/26/2021   HGB 12.8 11/26/2021   HCT 39.2 11/26/2021   MCV 92.0 11/26/2021   PLT 269 11/26/2021   Lab Results  Component Value Date   NA 142 11/26/2021   K 4.5 11/26/2021   CL 112 (H) 11/26/2021   CO2 25 11/26/2021    RADIOGRAPHIC STUDIES: I have personally reviewed the radiological reports and agreed with the findings in the  report.  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  Ultrasound revealed 2 areas of abnormality both of which were biopsied and were found to be benign fibroadenoma and fibrocystic change.  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 After reviewing the pathology in detail, we proceeded to discuss the different treatment options between surgery, radiation, chemotherapy, antiestrogen therapies.  Recommendations: 1. Breast conserving surgery followed by 2. Oncotype DX testing to determine if chemotherapy would be of any benefit followed by 3. Adjuvant radiation therapy followed by 4. Adjuvant antiestrogen therapy  Oncotype counseling: I discussed Oncotype DX test. I explained to the patient that this is a  21 gene panel to evaluate patient tumors DNA to calculate recurrence score. This would help determine whether patient has high risk or low risk breast cancer. She understands that if her tumor was found to be high risk, she would benefit from systemic chemotherapy. If low risk, no need of chemotherapy.  Return to clinic after surgery to discuss final pathology report and then determine if Oncotype DX testing will need to be sent.    All questions were answered. The patient knows to call the clinic with any problems, questions or concerns.    Harriette Ohara, MD 11/26/21

## 2021-11-26 NOTE — Research (Signed)
[employer] 2021-05 - Specimen Collection Study to Evaluate Biomarkers in Subjects with Cancer   Patient Amber Bowman was identified by Dr Lindi Adie as a potential candidate for the above listed study. This Clinical Research Nurse met with Amber Bowman, [id number], on 11/26/21 in a manner and location that ensures patient privacy to discuss participation in the above listed research study. Patient is Accompanied by her husband . A copy of the informed consent document with embedded HIPAA language was provided to the patient. Patient reads, speaks, and understands [place]. Patient was provided with the business card of this Nurse and encouraged to contact the research team with any questions. Approximately 15 minutes were spent with the patient reviewing the informed consent documents. Patient was provided the option of taking informed consent documents home to review and was encouraged to review at their convenience with their support network, including other care providers. Patient is comfortable with making a decision regarding study participation today.  Patient agreed to a follow-up call on Monday morning.   Vickii Penna, RN, BSN, CPN Clinical Research Nurse I [phone]  11/26/2021 3:54 PM

## 2021-11-28 ENCOUNTER — Other Ambulatory Visit: Payer: Self-pay | Admitting: General Surgery

## 2021-11-28 DIAGNOSIS — Z170 Estrogen receptor positive status [ER+]: Secondary | ICD-10-CM

## 2021-12-01 ENCOUNTER — Telehealth: Payer: Self-pay

## 2021-12-01 ENCOUNTER — Encounter (HOSPITAL_BASED_OUTPATIENT_CLINIC_OR_DEPARTMENT_OTHER): Payer: Self-pay | Admitting: General Surgery

## 2021-12-01 ENCOUNTER — Other Ambulatory Visit: Payer: Self-pay

## 2021-12-01 DIAGNOSIS — Z170 Estrogen receptor positive status [ER+]: Secondary | ICD-10-CM

## 2021-12-01 NOTE — Progress Notes (Signed)
   12/01/21 1528  PAT Phone Screen  Do You Have Diabetes? No  Do You Have Hypertension? No  Have You Ever Been to the ER for Asthma? No  Have You Taken Oral Steroids in the Past 3 Months? No  Do you Take Phenteramine or any Other Diet Drugs? No  Recent  Lab Work, EKG, CXR? Yes  Where was this test performed? [facility] 11/26/21 EKG 01/24/21 SB  Do you have a history of heart problems? Cardiologist (OV 02/03/21 Dr Geanie Berlin followed for h/o cardiomyopathy,)  Have You Ever Had Tests on Your Heart? Yes  When was Test Performed? 11/21/19  Where? ECHO EF 50-55%  Any Recent Hospitalizations? No  Height '5\' 2"'$  (1.575 m)  Weight 74.5 kg  Pat Appointment Scheduled No   Pt sees Dr Lynnette Caffey for management of her seizure disorder. Pt is compliant with her medicines and is taking them as directed. Last OV 07/16/21. During pre op phone call pt and husband report that she has 'a few' seizures per month, her last seizure being 2 weeks ago. LVM with Ermalinda Barrios at Dr Ethlyn Gallery that case will need to be moved to main OR as the patient falls outside of our outpatient guidelines.  Above explained to pt and her husband.

## 2021-12-01 NOTE — Telephone Encounter (Signed)
[employer] 2021-05 - Specimen Collection Study to Evaluate Biomarkers in Subjects with Cancer   Attempted to call patient for follow up on study interest. Note that she has appt for seed placement Friday 12/05/21; if Ms Bradburn would like to enroll in ES 2021-05, she would need to come in before that. No answer, left voicemail.  Marjie Skiff Brennon Otterness, RN, BSN, [facility] She  Her  Hers Clinical Research Nurse [place] [phone]  Pager [phone] 12/01/2021 1:04 PM

## 2021-12-01 NOTE — Telephone Encounter (Signed)
[employer] 2021-05 - Specimen Collection Study to Evaluate Biomarkers in Subjects with Cancer   Rec'd call back from Ms Quizon, who would like to come in tomorrow am at 18 for study consents and blood draw. She has questions about her upcoming appointments and the seed procedure; I sent a message to Dr Geralyn Flash nurses so they can call her back.  Marjie Skiff Noni Stonesifer, RN, BSN, [facility] She  Her  Hers Clinical Research Nurse [place] [phone]  Pager [phone] 12/01/2021 2:52 PM

## 2021-12-02 ENCOUNTER — Inpatient Hospital Stay: Payer: Medicare HMO

## 2021-12-02 ENCOUNTER — Other Ambulatory Visit: Payer: Self-pay

## 2021-12-02 DIAGNOSIS — C50512 Malignant neoplasm of lower-outer quadrant of left female breast: Secondary | ICD-10-CM

## 2021-12-02 LAB — RESEARCH LABS

## 2021-12-02 NOTE — Research (Unsigned)
[employer] 2021-05 - Specimen Collection Study to Evaluate Biomarkers in Subjects with Cancer    12/02/2021  SECOND ELIGIBILITY:  This Coordinator has reviewed this patient's inclusion and exclusion criteria as a second review and confirms Amber Bowman is eligible for study participation.  Patient may continue with enrollment.   CONSENT:  Patient Amber Bowman was identified by Dr. Lindi Adie as a potential candidate for the above listed study.  This Clinical Research Coordinator met with Amber Bowman, [name] on 12/02/21 in a manner and location that ensures patient privacy to discuss participation in the above listed research study.  Patient is Accompanied by her husband .  Patient was previously provided with informed consent documents.  Patient confirmed they have read the informed consent documents.  As outlined in the informed consent form, this Coordinator and Amber Bowman discussed the purpose of the research study, the investigational nature of the study, study procedures and requirements for study participation, potential risks and benefits of study participation, as well as alternatives to participation.  This study is not blinded or double-blinded. The patient understands participation is voluntary and they may withdraw from study participation at any time.  This study does not involve randomization.  This study does not involve an investigational drug or device. This study does not involve a placebo. Patient understands enrollment is pending full eligibility review.   Confidentiality and how the patient's information will be used as part of study participation were discussed.  Patient was informed there is reimbursement provided for their time and effort spent on trial participation.  The patient is encouraged to discuss research study participation with their insurance provider to determine what costs they may incur as part of study participation, including research related  injury.    All questions were answered to patient's satisfaction.  The informed consent with embedded HIPAA language was reviewed page by page.  The patient's mental and emotional status is appropriate to provide informed consent, and the patient verbalizes an understanding of study participation.  Patient has agreed to participate in the above listed research study and has voluntarily signed the informed consent version dated 22 Mar 2020 with embedded HIPAA language, version dated 22 Mar 2020  on 12/02/21 at 1027AM.  The patient was provided with a copy of the signed informed consent form with embedded HIPAA language for their reference.  No study specific procedures were obtained prior to the signing of the informed consent document.  Approximately 20 minutes were spent with the patient reviewing the informed consent documents.  Patient was not requested to complete a Release of Information form.   After consent completion, medical history was obtained by patient as follows:   Medical History:  High Blood Pressure  No Coronary Artery Disease No Lupus    No Rheumatoid Arthritis  No Diabetes   No      Lynch Syndrome  No  Is the patient currently taking a magnesium supplement?   No  Does the patient have a personal history of cancer (greater than 5 years ago)?  No  Does the patient have a family history of cancer in 1st or 2nd degree relatives? No  Does the patient have history of alcohol consumption? No    Does the patient have history of cigarette, cigar, pipe, or chewing tobacco use?  No   After medical history was obtained, patient and her husband were escorted to the lab area: Blood Collection: Research blood obtained by [facility] venipuncture. Patient tolerated well without  any adverse events. Gift Card: $50 gift card given to patient for her participation in this study.    Patient was thanked for her time and participation in the above mentioned study.   Carol Ada,  RT(R)(T) Clinical Research Coordinator

## 2021-12-02 NOTE — Research (Signed)
[employer] 2021-05 - Specimen Collection Study to Evaluate Biomarkers in Subjects with Cancer   This Nurse has reviewed this patient's inclusion and exclusion criteria and confirmed Amber Bowman is eligible for study participation.  Patient will continue with enrollment.  Eligibility confirmed by treating investigator, who also agrees that patient should proceed with enrollment.  Marjie Skiff Joane Postel, RN, BSN, [facility] She  Her  Hers Clinical Research Nurse [place] [phone]  Pager [phone] 12/02/2021 11:56 AM

## 2021-12-05 ENCOUNTER — Encounter (HOSPITAL_COMMUNITY): Payer: Self-pay | Admitting: General Surgery

## 2021-12-05 ENCOUNTER — Encounter: Payer: Self-pay | Admitting: *Deleted

## 2021-12-05 ENCOUNTER — Other Ambulatory Visit: Payer: Self-pay

## 2021-12-05 ENCOUNTER — Telehealth: Payer: Self-pay | Admitting: *Deleted

## 2021-12-05 ENCOUNTER — Ambulatory Visit
Admission: RE | Admit: 2021-12-05 | Discharge: 2021-12-05 | Disposition: A | Discharge status: Home / Self Care | Payer: Medicare HMO | Source: Ambulatory Visit | Attending: General Surgery | Admitting: General Surgery

## 2021-12-05 DIAGNOSIS — Z170 Estrogen receptor positive status [ER+]: Secondary | ICD-10-CM

## 2021-12-05 DIAGNOSIS — C50912 Malignant neoplasm of unspecified site of left female breast: Secondary | ICD-10-CM | POA: Diagnosis not present

## 2021-12-05 NOTE — Telephone Encounter (Signed)
Spoke with patient to follow up from [facility] 9/20 and assess navigation needs. Patient denies any questions or concerns at this time. Encouraged her to call should anything arise. Patient verbalized understanding.

## 2021-12-05 NOTE — Progress Notes (Signed)
PCP - Dr Lauretta Grill [place]   Cardiologist - Dr Chauncey Fischer  Neurology - Dr Cammy Brochure Oncology - Dr Nicholas Lose  Chest x-ray - 07/02/19 EKG - 01/24/21 CE - Requested Stress Test - 01/26/20 CE ECHO - 11/21/19 CE Cardiac Cath - n/a  ICD Pacemaker/Loop - n/a  Sleep Study -  n/a CPAP - none  Aspirin Instructions: Follow your surgeon's instructions on when to stop aspirin prior to surgery,  If no instructions were given by your surgeon then you will need to call the office for those instructions.  ERAS: Clear liquids til 12 Noon on DOS.  Anesthesia review: Yes  STOP now taking any Aspirin (unless otherwise instructed by your surgeon), Aleve, Naproxen, Ibuprofen, Motrin, Advil, Goody's, BC's, all herbal medications, fish oil, and all vitamins.   Coronavirus Screening Do you have any of the following symptoms:  Cough yes/no: No Fever (>100.57F)  yes/no: No Runny nose yes/no: No Sore throat yes/no: No Difficulty breathing/shortness of breath  yes/no: No  Have you traveled in the last 14 days and where? yes/no: No  Patient verbalized understanding of instructions that were given via phone.

## 2021-12-05 NOTE — Anesthesia Preprocedure Evaluation (Signed)
Anesthesia Evaluation Anesthesia Physical Anesthesia Plan  ASA:   Anesthesia Plan:    Post-op Pain Management:    Induction:   PONV Risk Score and Plan:   Airway Management Planned:   Additional Equipment:   Intra-op Plan:   Post-operative Plan:   Informed Consent:   Plan Discussed with:   Anesthesia Plan Comments: (PAT note written 12/05/2021 by Myra Gianotti, PA-C. )        Anesthesia Quick Evaluation

## 2021-12-05 NOTE — Progress Notes (Signed)
Anesthesia Chart Review: Amber Bowman  Case: [id number] Date/Time: 12/08/21 1445   Procedure: LEFT BREAST LUMPECTOMY WITH RADIOACTIVE SEED AND SENTINEL LYMPH NODE BIOPSY (Left: Breast)   Anesthesia type: General   Pre-op diagnosis: LEFT BREAST CANCER   Location: [facility] OR ROOM 02 / [facility]   Surgeons: Jovita Kussmaul, MD       DISCUSSION: Patient is a 66 year old female scheduled for the above procedure.  She was moved from the surgery center to [place] due to history of seizures.  History includes never smoker, epilepsy, left breast cancer (11/20/21 left breast biopsy: invasive mammary carcinoma & in situ),  nephrolithiasis, AKI (acute renal failure 06/2019 in setting of pyelonephritis, s/p IV antibiotics, pressor support, nephrostomy tube, CRRT), cardiomyopathy (LVEF 40-45% with diffuse LV hypokinesis 06/30/19 in setting of septic shock d/t E. Coli pyelonephritis; negative stress echo, EF 60-65% 01/26/20).  Last visit with her neurologist Dr. Lynnette Caffey was on 07/16/2021. He noted, "Since her last clinic visit, patient feels like she has been doing quite well from a neurologic perspective. May have had a few small little seizures, but nothing major and feels like overall the medication has been very effective."  Follow-up in 6 months plan.  Last cardiology follow-up with Dr. Geanie Berlin was on 01/24/21.  Denies CV symptoms.  EKG showed mild sinus bradycardia.  Notes she can walk 3 miles without problems, but may consider a 48-hour cardiac monitor in the future.  She denies syncope or near syncope  Continue current medications.  She had an EKG on 01/24/21 at her last cardiology visit and a CBC and CMP on 11/26/21.   RSL is scheduled for 12/05/21 at 2:30 PM. Anesthesia team to evaluate on the day of surgery. Per PAT phone RN interview, she felt seizure control was at baseline and denied any new CV symptoms since her last cardiology visit.    VS: Ht '5\' 2"'$  (1.575 m)   Wt 74.5 kg   LMP 02/11/2008    BMI 30.04 kg/m  BP Readings from Last 3 Encounters:  11/26/21 126/65  06/25/21 (!) 103/58  03/13/21 124/78   Pulse Readings from Last 3 Encounters:  11/26/21 (!) 54  06/25/21 88  03/13/21 100     PROVIDERS: Lujean Amel, MD is PCP  Cammy Brochure, MD is neurologist [facility]) Marina Goodell, MD is cardiologist [facility] [place]) Nicholas Lose, MD is HEM-ONC Eppie Gibson, MD is RAD-ONC   LABS: Lab results on 11/26/21 include: Lab Results  Component Value Date   WBC 6.8 11/26/2021   HGB 12.8 11/26/2021   HCT 39.2 11/26/2021   PLT 269 11/26/2021   GLUCOSE 97 11/26/2021   ALT 13 11/26/2021   AST 14 (L) 11/26/2021   NA 142 11/26/2021   K 4.5 11/26/2021   CL 112 (H) 11/26/2021   CREATININE 0.95 11/26/2021   BUN 15 11/26/2021   CO2 25 11/26/2021   INR 1.3 (H) 06/30/2019     EKG:  EKG 01/24/21 [facility]): Sinus  Bradycardia, HR 52  -Nonspecific small negative T-waves in aVL, V1 and V2.     CV: Exercise Stress Echo 01/26/20 ([facility]); Left Ventricle: Systolic function is normal. EF: 60-65%.    Aorta: The aortic root is normal in size.  Proximal ascending aorta  size is at upper limit of normal-3.5 cm.    Left Ventricle: Wall motion is normal.    Post-stress: The left ventricle systolic function is hyperdynamic  post-stress with an EF of 70-75 %.  Post-stress: The post-stress echo showed normal wall motion which was  hyperdynamic compared to baseline.    Stress ECG: ECG Conclusion:  No ischemic ST segment changes occurred  with stress. No chest pain. Normal functional capacity for age and gender  with estimated workload 7.2 METS.    Stress ECG: Some isolated PVCs noted in recovery.    Post-stress Impression: The study is normal.    Post-stress Impression: This study shows a low prognostic risk.    Post-stress Impression: The ECG and Echo portions of the stress study  are concordant with no evidence of inducible myocardial ischemia.   Echo  11/21/19 ([facility]): Left Ventricle: Systolic function is normal. EF: 55-60%.    Left Ventricle: Wall thickness is normal.    Left Ventricle: No regional wall motion abnormalities noted.    Left Ventricle: Doppler parameters indicate normal diastolic function.    Left Atrium: Left atrium is normal in size.    Right Ventricle: Abnormal tricuspid annular plane systolic excursion  (TAPSE) <1.7 cm.    Right Ventricle: Right ventricle appears normal.    Aortic Valve: The aortic valve is tricuspid. The leaflets are not  thickened and exhibit normal excursion.    Aortic Valve: There is no regurgitation or stenosis.    Mitral Valve: Mitral valve structure is normal.    Mitral Valve: There is no mitral regurgitation.    Tricuspid Valve: Tricuspid valve structure is normal.    Tricuspid Valve: There is trace regurgitation. - Comparison echo from 06/30/19 during admission for septic shock showed LVEF 40-45%, diffuse LV hypokinesis, normal RVSF, mildly elevated RVSP 39.8 mmHg, mild MR, moderate TR   Past Medical History:  Diagnosis Date   Anemia    Cancer ([place])    left breast   Cardiomyopathy ([place]) 06/08/2019   CHF (congestive heart failure) ([place]) 07/2019   per denies this dx   Colon polyps 2014   Condyloma    Depression    Epilepsy ([place])    last seizure reported was 9//2023   GERD (gastroesophageal reflux disease)    Osteopenia 04/2018   T score -1.8 FRAX 9.5% / 1.1% overall stable from prior DEXA   Seizures ([place])    last on 11/2021,  "few seizures per month " per husband   Thrombocytopenia [facility])     Past Surgical History:  Procedure Laterality Date   CATARACT EXTRACTION Bilateral 03/10/2015   CYSTOSCOPY W/ RETROGRADES Right 06/25/2021   Procedure: CYSTOSCOPY WITH RIGHT RETROGRADE PYELOGRAM, REMOVAL OF BLADDER STONE;  Surgeon: Raynelle Bring, MD;  Location: [facility];  Service: Urology;  Laterality: Right;   CYSTOSCOPY/URETEROSCOPY/HOLMIUM LASER/STENT PLACEMENT Left 09/04/2019    Procedure: CYSTOSCOPY/RETROGRADE/ URETEROSCOPY/HOLMIUM LASER/STENT PLACEMENT/ REMOVAL OF NEPHROSTOMY TUBE;  Surgeon: Raynelle Bring, MD;  Location: [facility];  Service: Urology;  Laterality: Left;   IR NEPHROSTOMY EXCHANGE LEFT  08/17/2019   IR NEPHROSTOMY PLACEMENT LEFT  06/28/2019    MEDICATIONS: No current facility-administered medications for this encounter.    aspirin 81 MG EC tablet   atorvastatin (LIPITOR) 10 MG tablet   Cranberry 500 MG TABS   felbamate (FELBATOL) 600 MG tablet   levETIRAcetam (KEPPRA) 500 MG tablet   Loteprednol Etabonate 0.5 % GEL   Omega-3 Fatty Acids (FISH OIL) 1000 MG CPDR   omeprazole (PRILOSEC) 40 MG capsule   PRESCRIPTION MEDICATION   Propylene Glycol, PF, (SYSTANE COMPLETE PF) 0.6 % SOLN   topiramate (TOPAMAX) 200 MG tablet   TURMERIC CURCUMIN PO    Myra Gianotti, PA-C Surgical Short  Stay/Anesthesiology [facility] Phone [phone] [facility] Phone [phone] 12/05/2021 11:36 AM

## 2021-12-08 ENCOUNTER — Ambulatory Visit (HOSPITAL_COMMUNITY): Payer: Medicare HMO | Admitting: Vascular Surgery

## 2021-12-08 ENCOUNTER — Other Ambulatory Visit: Payer: Self-pay

## 2021-12-08 ENCOUNTER — Ambulatory Visit
Admission: RE | Admit: 2021-12-08 | Discharge: 2021-12-08 | Disposition: A | Discharge status: Home / Self Care | Payer: Medicare HMO | Source: Ambulatory Visit | Attending: General Surgery | Admitting: General Surgery

## 2021-12-08 ENCOUNTER — Ambulatory Visit (HOSPITAL_COMMUNITY)
Admission: RE | Admit: 2021-12-08 | Discharge: 2021-12-08 | Disposition: A | Discharge status: Home / Self Care | Payer: Medicare HMO | Attending: General Surgery | Admitting: General Surgery

## 2021-12-08 ENCOUNTER — Encounter (HOSPITAL_COMMUNITY)
Admission: RE | Disposition: A | Discharge status: Home / Self Care | Payer: Self-pay | Source: Home / Self Care | Attending: General Surgery

## 2021-12-08 ENCOUNTER — Ambulatory Visit (HOSPITAL_BASED_OUTPATIENT_CLINIC_OR_DEPARTMENT_OTHER): Payer: Medicare HMO | Admitting: Vascular Surgery

## 2021-12-08 ENCOUNTER — Encounter (HOSPITAL_COMMUNITY): Payer: Self-pay | Admitting: General Surgery

## 2021-12-08 DIAGNOSIS — R69 Illness, unspecified: Secondary | ICD-10-CM | POA: Diagnosis not present

## 2021-12-08 DIAGNOSIS — F32A Depression, unspecified: Secondary | ICD-10-CM | POA: Insufficient documentation

## 2021-12-08 DIAGNOSIS — C773 Secondary and unspecified malignant neoplasm of axilla and upper limb lymph nodes: Secondary | ICD-10-CM | POA: Diagnosis not present

## 2021-12-08 DIAGNOSIS — K219 Gastro-esophageal reflux disease without esophagitis: Secondary | ICD-10-CM | POA: Insufficient documentation

## 2021-12-08 DIAGNOSIS — Z170 Estrogen receptor positive status [ER+]: Secondary | ICD-10-CM | POA: Insufficient documentation

## 2021-12-08 DIAGNOSIS — G40909 Epilepsy, unspecified, not intractable, without status epilepticus: Secondary | ICD-10-CM | POA: Insufficient documentation

## 2021-12-08 DIAGNOSIS — C50912 Malignant neoplasm of unspecified site of left female breast: Secondary | ICD-10-CM

## 2021-12-08 DIAGNOSIS — G8918 Other acute postprocedural pain: Secondary | ICD-10-CM | POA: Diagnosis not present

## 2021-12-08 DIAGNOSIS — C50512 Malignant neoplasm of lower-outer quadrant of left female breast: Secondary | ICD-10-CM | POA: Diagnosis not present

## 2021-12-08 DIAGNOSIS — R928 Other abnormal and inconclusive findings on diagnostic imaging of breast: Secondary | ICD-10-CM | POA: Diagnosis not present

## 2021-12-08 HISTORY — PX: BREAST LUMPECTOMY WITH RADIOACTIVE SEED AND SENTINEL LYMPH NODE BIOPSY: SHX6550

## 2021-12-08 HISTORY — DX: Thrombocytopenia, unspecified: D69.6

## 2021-12-08 HISTORY — DX: Malignant (primary) neoplasm, unspecified: C80.1

## 2021-12-08 HISTORY — DX: Anemia, unspecified: D64.9

## 2021-12-08 HISTORY — DX: Gastro-esophageal reflux disease without esophagitis: K21.9

## 2021-12-08 LAB — SURGICAL PCR SCREEN
MRSA, PCR: NEGATIVE
Staphylococcus aureus: NEGATIVE

## 2021-12-08 SURGERY — BREAST LUMPECTOMY WITH RADIOACTIVE SEED AND SENTINEL LYMPH NODE BIOPSY
Anesthesia: General | Site: Breast | Laterality: Left

## 2021-12-08 MED ORDER — LIDOCAINE 2% (20 MG/ML) 5 ML SYRINGE
INTRAMUSCULAR | Status: AC
  Filled 2021-12-08: qty 5

## 2021-12-08 MED ORDER — FENTANYL CITRATE (PF) 250 MCG/5ML IJ SOLN
INTRAMUSCULAR | Status: DC | PRN
  Administered 2021-12-08: 50 ug via INTRAVENOUS
  Administered 2021-12-08: 25 ug via INTRAVENOUS

## 2021-12-08 MED ORDER — LACTATED RINGERS IV SOLN: INTRAVENOUS | Status: DC

## 2021-12-08 MED ORDER — MIDAZOLAM HCL 2 MG/2ML IJ SOLN
INTRAMUSCULAR | Status: DC | PRN
  Administered 2021-12-08 (×2): 1 mg via INTRAVENOUS

## 2021-12-08 MED ORDER — 0.9 % SODIUM CHLORIDE (POUR BTL) OPTIME
TOPICAL | Status: DC | PRN
  Administered 2021-12-08: 1000 mL

## 2021-12-08 MED ORDER — ONDANSETRON HCL 4 MG/2ML IJ SOLN
INTRAMUSCULAR | Status: DC | PRN
  Administered 2021-12-08: 4 mg via INTRAVENOUS

## 2021-12-08 MED ORDER — FENTANYL CITRATE (PF) 100 MCG/2ML IJ SOLN: 50.0000 ug | Freq: Once | INTRAMUSCULAR | Status: AC

## 2021-12-08 MED ORDER — CHLORHEXIDINE GLUCONATE 0.12 % MT SOLN: 15.0000 mL | Freq: Once | OROMUCOSAL | Status: AC

## 2021-12-08 MED ORDER — CHLORHEXIDINE GLUCONATE 0.12 % MT SOLN
OROMUCOSAL | Status: AC
  Administered 2021-12-08: 15 mL via OROMUCOSAL
  Filled 2021-12-08: qty 15

## 2021-12-08 MED ORDER — BUPIVACAINE-EPINEPHRINE (PF) 0.25% -1:200000 IJ SOLN
INTRAMUSCULAR | Status: AC
  Filled 2021-12-08: qty 30

## 2021-12-08 MED ORDER — MIDAZOLAM HCL 2 MG/2ML IJ SOLN
INTRAMUSCULAR | Status: AC
  Filled 2021-12-08: qty 2

## 2021-12-08 MED ORDER — OXYCODONE HCL 5 MG PO TABS: 5.0000 mg | ORAL_TABLET | Freq: Four times a day (QID) | ORAL | 0 refills | Status: DC | PRN

## 2021-12-08 MED ORDER — EPHEDRINE 5 MG/ML INJ
INTRAVENOUS | Status: AC
  Filled 2021-12-08: qty 5

## 2021-12-08 MED ORDER — MIDAZOLAM HCL 2 MG/2ML IJ SOLN
INTRAMUSCULAR | Status: AC
  Administered 2021-12-08: 1 mg via INTRAVENOUS
  Filled 2021-12-08: qty 2

## 2021-12-08 MED ORDER — FENTANYL CITRATE (PF) 100 MCG/2ML IJ SOLN
INTRAMUSCULAR | Status: AC
  Administered 2021-12-08: 50 ug via INTRAVENOUS
  Filled 2021-12-08: qty 2

## 2021-12-08 MED ORDER — CHLORHEXIDINE GLUCONATE CLOTH 2 % EX PADS: 6.0000 | MEDICATED_PAD | Freq: Once | CUTANEOUS | Status: DC

## 2021-12-08 MED ORDER — PROPOFOL 10 MG/ML IV BOLUS
INTRAVENOUS | Status: AC
  Filled 2021-12-08: qty 20

## 2021-12-08 MED ORDER — PHENYLEPHRINE 80 MCG/ML (10ML) SYRINGE FOR IV PUSH (FOR BLOOD PRESSURE SUPPORT)
PREFILLED_SYRINGE | INTRAVENOUS | Status: AC
  Filled 2021-12-08: qty 10

## 2021-12-08 MED ORDER — PHENYLEPHRINE 80 MCG/ML (10ML) SYRINGE FOR IV PUSH (FOR BLOOD PRESSURE SUPPORT)
PREFILLED_SYRINGE | INTRAVENOUS | Status: DC | PRN
  Administered 2021-12-08 (×6): 160 ug via INTRAVENOUS

## 2021-12-08 MED ORDER — DEXAMETHASONE SODIUM PHOSPHATE 10 MG/ML IJ SOLN
INTRAMUSCULAR | Status: AC
  Filled 2021-12-08: qty 1

## 2021-12-08 MED ORDER — LIDOCAINE 2% (20 MG/ML) 5 ML SYRINGE
INTRAMUSCULAR | Status: DC | PRN
  Administered 2021-12-08: 100 mg via INTRAVENOUS

## 2021-12-08 MED ORDER — DEXAMETHASONE SODIUM PHOSPHATE 10 MG/ML IJ SOLN
INTRAMUSCULAR | Status: DC | PRN
  Administered 2021-12-08: 4 mg via INTRAVENOUS

## 2021-12-08 MED ORDER — BUPIVACAINE-EPINEPHRINE 0.25% -1:200000 IJ SOLN
INTRAMUSCULAR | Status: DC | PRN
  Administered 2021-12-08: 16 mL

## 2021-12-08 MED ORDER — ONDANSETRON HCL 4 MG/2ML IJ SOLN
INTRAMUSCULAR | Status: AC
  Filled 2021-12-08: qty 2

## 2021-12-08 MED ORDER — ACETAMINOPHEN 500 MG PO TABS
1000.0000 mg | ORAL_TABLET | ORAL | Status: DC
  Filled 2021-12-08: qty 2

## 2021-12-08 MED ORDER — CEFAZOLIN SODIUM-DEXTROSE 2-4 GM/100ML-% IV SOLN
2.0000 g | INTRAVENOUS | Status: AC
  Administered 2021-12-08: 2 g via INTRAVENOUS
  Filled 2021-12-08: qty 100

## 2021-12-08 MED ORDER — MAGTRACE LYMPHATIC TRACER
INTRAMUSCULAR | Status: DC | PRN
  Administered 2021-12-08: 2 mL via INTRAMUSCULAR

## 2021-12-08 MED ORDER — FENTANYL CITRATE (PF) 250 MCG/5ML IJ SOLN
INTRAMUSCULAR | Status: AC
  Filled 2021-12-08: qty 5

## 2021-12-08 MED ORDER — CELECOXIB 200 MG PO CAPS
200.0000 mg | ORAL_CAPSULE | ORAL | Status: DC
  Filled 2021-12-08: qty 1

## 2021-12-08 MED ORDER — BUPIVACAINE LIPOSOME 1.3 % IJ SUSP
INTRAMUSCULAR | Status: DC | PRN
  Administered 2021-12-08: 10 mL via PERINEURAL

## 2021-12-08 MED ORDER — EPHEDRINE SULFATE-NACL 50-0.9 MG/10ML-% IV SOSY
PREFILLED_SYRINGE | INTRAVENOUS | Status: DC | PRN
  Administered 2021-12-08: 10 mg via INTRAVENOUS
  Administered 2021-12-08: 5 mg via INTRAVENOUS
  Administered 2021-12-08: 10 mg via INTRAVENOUS

## 2021-12-08 MED ORDER — BUPIVACAINE-EPINEPHRINE (PF) 0.5% -1:200000 IJ SOLN
INTRAMUSCULAR | Status: DC | PRN
  Administered 2021-12-08: 20 mL via PERINEURAL

## 2021-12-08 MED ORDER — PROPOFOL 10 MG/ML IV BOLUS
INTRAVENOUS | Status: DC | PRN
  Administered 2021-12-08: 150 mg via INTRAVENOUS

## 2021-12-08 MED ORDER — GABAPENTIN 300 MG PO CAPS
300.0000 mg | ORAL_CAPSULE | ORAL | Status: DC
  Filled 2021-12-08: qty 1

## 2021-12-08 MED ORDER — MIDAZOLAM HCL 2 MG/2ML IJ SOLN: 1.0000 mg | Freq: Once | INTRAMUSCULAR | Status: AC

## 2021-12-08 MED ORDER — ORAL CARE MOUTH RINSE: 15.0000 mL | Freq: Once | OROMUCOSAL | Status: AC

## 2021-12-08 SURGICAL SUPPLY — 34 items
ADH SKN CLS APL DERMABOND .7 (GAUZE/BANDAGES/DRESSINGS) ×1
APL PRP STRL LF DISP 70% ISPRP (MISCELLANEOUS) ×1
APPLIER CLIP 9.375 MED OPEN (MISCELLANEOUS) ×2
APR CLP MED 9.3 20 MLT OPN (MISCELLANEOUS) ×2
BAG COUNTER SPONGE SURGICOUNT (BAG) IMPLANT
BAG SPNG CNTER NS LX DISP (BAG) ×1
BINDER BREAST XLRG (GAUZE/BANDAGES/DRESSINGS) IMPLANT
CANISTER SUCT 3000ML PPV (MISCELLANEOUS) ×2 IMPLANT
CHLORAPREP W/TINT 26 (MISCELLANEOUS) ×2 IMPLANT
CLIP APPLIE 9.375 MED OPEN (MISCELLANEOUS) ×2 IMPLANT
CNTNR URN SCR LID CUP LEK RST (MISCELLANEOUS) ×2 IMPLANT
CONT SPEC 4OZ STRL OR WHT (MISCELLANEOUS) ×1
COVER PROBE W GEL 5X96 (DRAPES) ×2 IMPLANT
COVER SURGICAL LIGHT HANDLE (MISCELLANEOUS) ×2 IMPLANT
DERMABOND ADVANCED .7 DNX12 (GAUZE/BANDAGES/DRESSINGS) ×2 IMPLANT
DEVICE DUBIN SPECIMEN MAMMOGRA (MISCELLANEOUS) ×2 IMPLANT
DRAPE CHEST BREAST 15X10 FENES (DRAPES) ×2 IMPLANT
ELECT COATED BLADE 2.86 ST (ELECTRODE) ×2 IMPLANT
ELECT REM PT RETURN 9FT ADLT (ELECTROSURGICAL) ×1
ELECTRODE REM PT RTRN 9FT ADLT (ELECTROSURGICAL) ×2 IMPLANT
GLOVE BIO SURGEON STRL SZ7.5 (GLOVE) ×4 IMPLANT
GOWN STRL REUS W/ TWL LRG LVL3 (GOWN DISPOSABLE) ×4 IMPLANT
GOWN STRL REUS W/TWL LRG LVL3 (GOWN DISPOSABLE) ×2
KIT BASIN OR (CUSTOM PROCEDURE TRAY) ×2 IMPLANT
KIT MARKER MARGIN INK (KITS) ×2 IMPLANT
NDL HYPO 25GX1X1/2 BEV (NEEDLE) ×2 IMPLANT
NEEDLE HYPO 25GX1X1/2 BEV (NEEDLE) ×1 IMPLANT
NS IRRIG 1000ML POUR BTL (IV SOLUTION) ×2 IMPLANT
PACK GENERAL/GYN (CUSTOM PROCEDURE TRAY) ×2 IMPLANT
SUT MNCRL AB 4-0 PS2 18 (SUTURE) ×4 IMPLANT
SUT VIC AB 3-0 SH 18 (SUTURE) ×2 IMPLANT
SYR CONTROL 10ML LL (SYRINGE) ×2 IMPLANT
TOWEL GREEN STERILE (TOWEL DISPOSABLE) ×2 IMPLANT
TOWEL GREEN STERILE FF (TOWEL DISPOSABLE) ×2 IMPLANT

## 2021-12-08 NOTE — Anesthesia Procedure Notes (Signed)
Anesthesia Regional Block: Pectoralis block   Pre-Anesthetic Checklist: , timeout performed,  Correct Patient, Correct Site, Correct Laterality,  Correct Procedure, Correct Position, site marked,  Risks and benefits discussed,  Pre-op evaluation,  At surgeon's request and post-op pain management  Laterality: Left  Prep: Maximum Sterile Barrier Precautions used, chloraprep       Needles:  Injection technique: Single-shot  Needle Type: Echogenic Stimulator Needle     Needle Length: 9cm  Needle Gauge: 22     Additional Needles:   Procedures:,,,, ultrasound used (permanent image in chart),,    Narrative:  Start time: 12/08/2021 11:16 AM End time: 12/08/2021 11:19 AM Injection made incrementally with aspirations every 5 mL.  Performed by: Personally  Anesthesiologist: Brennan Bailey, MD  Additional Notes: Risks, benefits, and alternative discussed. Patient gave consent for procedure. Patient prepped and draped in sterile fashion. Sedation administered, patient remains easily responsive to voice. Relevant anatomy identified with ultrasound guidance. Local anesthetic given in 5cc increments with no signs or symptoms of intravascular injection. No pain or paraesthesias with injection. Patient monitored throughout procedure with signs of LAST or immediate complications. Tolerated well. Ultrasound image placed in chart.  Tawny Asal, MD

## 2021-12-08 NOTE — Interval H&P Note (Signed)
History and Physical Interval Note:  12/08/2021 11:56 AM  Amber Bowman  has presented today for surgery, with the diagnosis of LEFT BREAST CANCER.  The various methods of treatment have been discussed with the patient and family. After consideration of risks, benefits and other options for treatment, the patient has consented to  Procedure(s): LEFT BREAST LUMPECTOMY WITH RADIOACTIVE SEED AND SENTINEL LYMPH NODE BIOPSY (Left) as a surgical intervention.  The patient's history has been reviewed, patient examined, no change in status, stable for surgery.  I have reviewed the patient's chart and labs.  Questions were answered to the patient's satisfaction.     Autumn Messing III

## 2021-12-08 NOTE — Transfer of Care (Signed)
Immediate Anesthesia Transfer of Care Note  Patient: Amber Bowman  Procedure(s) Performed: LEFT BREAST LUMPECTOMY WITH RADIOACTIVE SEED AND SENTINEL LYMPH NODE BIOPSY (Left: Breast)  Patient Location: PACU  Anesthesia Type:General and Regional  Level of Consciousness: drowsy and patient cooperative  Airway & Oxygen Therapy: Patient Spontanous Breathing  Post-op Assessment: Report given to RN  Post vital signs: Reviewed and stable  Last Vitals:  Vitals Value Taken Time  BP 124/78 12/08/21 1356  Temp    Pulse 78 12/08/21 1357  Resp 16 12/08/21 1357  SpO2 95 % 12/08/21 1357  Vitals shown include unvalidated device data.  Last Pain:  Vitals:   12/08/21 1039  TempSrc:   PainSc: 0-No pain         Complications: No notable events documented.

## 2021-12-08 NOTE — Anesthesia Procedure Notes (Signed)
Procedure Name: LMA Insertion Date/Time: 12/08/2021 12:21 PM  Performed by: Barrington Ellison, CRNAPre-anesthesia Checklist: Patient identified, Emergency Drugs available, Suction available and Patient being monitored Patient Re-evaluated:Patient Re-evaluated prior to induction Oxygen Delivery Method: Circle System Utilized Preoxygenation: Pre-oxygenation with 100% oxygen Induction Type: IV induction Ventilation: Mask ventilation without difficulty LMA: LMA inserted LMA Size: 4.0 Number of attempts: 1 Placement Confirmation: positive ETCO2 Tube secured with: Tape Dental Injury: Teeth and Oropharynx as per pre-operative assessment

## 2021-12-08 NOTE — Anesthesia Postprocedure Evaluation (Signed)
Anesthesia Post Note  Patient: Amber Bowman  Procedure(s) Performed: LEFT BREAST LUMPECTOMY WITH RADIOACTIVE SEED AND SENTINEL LYMPH NODE BIOPSY (Left: Breast)     Patient location during evaluation: PACU Anesthesia Type: General Level of consciousness: awake and alert Pain management: pain level controlled Vital Signs Assessment: post-procedure vital signs reviewed and stable Respiratory status: spontaneous breathing, nonlabored ventilation and respiratory function stable Cardiovascular status: blood pressure returned to baseline Postop Assessment: no apparent nausea or vomiting Anesthetic complications: no   No notable events documented.  Last Vitals:  Vitals:   12/08/21 1411 12/08/21 1426  BP: 138/74 132/71  Pulse: 75 74  Resp: 12 17  Temp:  36.5 C  SpO2: 97% 94%    Last Pain:  Vitals:   12/08/21 1426  TempSrc:   PainSc: 0-No pain                 Marthenia Rolling

## 2021-12-08 NOTE — Progress Notes (Signed)
Left breast lumpectomy w/radioactive seed (green pin marks seed '@H5'$ , yellow pin marks clip '@G5'$ )

## 2021-12-08 NOTE — Op Note (Signed)
12/08/2021  1:49 PM  PATIENT:  Amber Bowman  66 y.o. female  PRE-OPERATIVE DIAGNOSIS:  LEFT BREAST CANCER  POST-OPERATIVE DIAGNOSIS:  LEFT BREAST CANCER  PROCEDURE:  Procedure(s): LEFT BREAST LUMPECTOMY WITH RADIOACTIVE SEED LOCALIZATION AND DEEP LEFT AXILLARY SENTINEL LYMPH NODE BIOPSY (Left)  SURGEON:  Surgeon(s) and Role:    * Jovita Kussmaul, MD - Primary  PHYSICIAN ASSISTANT:   ASSISTANTS: none   ANESTHESIA:   local and general  EBL:  minimal   BLOOD ADMINISTERED:none  DRAINS: none   LOCAL MEDICATIONS USED:  MARCAINE     SPECIMEN:  Source of Specimen:  left breast tissue with sentinel node biopsy x 3  DISPOSITION OF SPECIMEN:  PATHOLOGY  COUNTS:  YES  TOURNIQUET:  * No tourniquets in log *  DICTATION: .Dragon Dictation  After informed consent was obtained the patient was brought to the operating room and placed in the supine position on the operating table.  After adequate induction of general anesthesia the patient's left chest, breast, and axillary area were prepped with ChloraPrep, allowed to dry, and draped in usual sterile manner.  An appropriate timeout was performed.  At this point, 2 cc of iron oxide were injected into the subareolar plexus of the left breast and the breast was massaged for 5 minutes.  Previously an I-125 seed was placed in the lower outer quadrant of the left breast to mark an area of invasive breast cancer.  The neoprobe was set to I-125 in the area of radioactivity was readily identified.  The signal for the seed was very superficial to the skin.  Because of this I elected to make an elliptical incision radially oriented overlying the area of radioactivity with a 15 blade knife.  The incision was carried through the skin and subcutaneous tissue sharply with the electrocautery.  Dissection was then carried widely around the radioactive seed while checking the area of radioactivity frequently.  Once the specimen was removed it was oriented with  the appropriate paint colors.  A specimen radiograph was obtained that showed the clip and seed to be near the center of the specimen.  The specimen was then sent to pathology for further evaluation.  Hemostasis was achieved using the Bovie electrocautery.  The wound was irrigated with saline and infiltrated with more quarter percent Marcaine.  The cavity was marked with clips.  The deep layer of the incision was then closed with layers of interrupted 3-0 Vicryl stitches.  The skin was then closed with interrupted 4-0 Monocryl subcuticular stitches.  Attention was then turned to the left axilla.  The Sentimag was used to identify a signal in the left axilla.  The area overlying this was infiltrated with quarter percent Marcaine.  A small transversely oriented incision was made with a 15 blade knife overlying the signal in the right axilla.  The incision was carried through the skin and subcutaneous tissue sharply with the electrocautery until the deep left axillary space was entered.  I was able to identify 3 lymph nodes.  One had signal and the other 2 were palpable.  Each of these was excised sharply with the electrocautery and the surrounding small vessels and lymphatics were controlled with clips.  These were sent as sentinel nodes numbers 1 through 3.  No other hot or palpable nodes were identified in the left axilla.  Hemostasis was achieved using the Bovie electrocautery.  The deep layer of the incision was then closed with interrupted 3-0 Vicryl stitches.  The skin  was then closed with a running 4-0 Monocryl subcuticular stitch.  Dermabond dressings were applied.  The patient tolerated the procedure well.  At the end of the case all needle sponge and instrument counts were correct.  The patient was then awakened and taken to recovery in stable condition.  PLAN OF CARE: Discharge to home after PACU  PATIENT DISPOSITION:  PACU - hemodynamically stable.   Delay start of Pharmacological VTE agent (>24hrs)  due to surgical blood loss or risk of bleeding: not applicable

## 2021-12-08 NOTE — H&P (Signed)
REFERRING PHYSICIAN: Rulon Eisenmenger, MD  PROVIDER: Landry Corporal, MD  MRN: [id number] DOB: 22-Jan-1956 Subjective   Chief Complaint: Breast Cancer   History of Present Illness: Amber Bowman is a 66 y.o. female who is seen today as an office consultation for evaluation of Breast Cancer .   We are asked to see the patient in consultation by Dr. Lindi Adie to evaluate her for a new left breast cancer. The patient is a 66 year old white female who recently went for a routine screening mammogram. At that time she was found to have a 1.7 cm area of distortion in the lower outer quadrant of the left breast. The axilla looked normal. The distortion was biopsied and came back as a grade 1 invasive ductal cancer that was ER positive and PR negative and HER2 negative with a Ki-67 of 10%. Her main history is of seizures which are manifested by her gathering things but not remembering the event. She is otherwise in pretty good health and does not smoke.  Review of Systems: A complete review of systems was obtained from the patient. I have reviewed this information and discussed as appropriate with the patient. See HPI as well for other ROS.  ROS   Medical History: Past Medical History:  Diagnosis Date  Seizures (CMS-HCC)   Patient Active Problem List  Diagnosis  Malignant neoplasm of lower-outer quadrant of left breast of female, estrogen receptor positive (CMS-HCC)   Past Surgical History:  Procedure Laterality Date  kidney stone removal    No Known Allergies  Current Outpatient Medications on File Prior to Visit  Medication Sig Dispense Refill  amitriptyline HCl (AMITRIPTYLINE ORAL) Take by mouth  atorvastatin (LIPITOR) 10 MG tablet TAKE 1 TABLET BY MOUTH EVERY DAY AT BEDTIME FOR 30 DAYS  cranberry fruit extract (CRANBERRY EXTRACT, BULK,) 12:1 Powd Take by mouth  felbamate (FELBATOL) 600 MG tablet TAKE 1/2 TABLET (300 MG DOSE) BY MOUTH 2 (TWO) TIMES DAILY.  gabapentin (NEURONTIN) 100  MG capsule Take 100 mg by mouth 3 (three) times daily  levETIRAcetam (KEPPRA) 500 MG tablet TAKE 1 & 1/2 TABLETS (750 MG DOSE) BY MOUTH 2 (TWO) TIMES DAILY.  lidocaine (XYLOCAINE) 2 % jelly Apply topically as needed  METRONIDAZOLE, BULK, MISC Use  omega-3 fatty acids-fish oil 360-1,200 mg Cap 1 capsule  topiramate (TOPAMAX) 200 MG tablet Take 200 mg by mouth 2 (two) times daily  TURMERIC ORAL Take by mouth   No current facility-administered medications on file prior to visit.   Family History  Problem Relation Age of Onset  Stroke Mother  High blood pressure (Hypertension) Mother  High blood pressure (Hypertension) Father    Social History   Tobacco Use  Smoking Status Never  Smokeless Tobacco Never    Social History   Socioeconomic History  Marital status: Single  Tobacco Use  Smoking status: Never  Smokeless tobacco: Never   Objective:  There were no vitals filed for this visit.  There is no height or weight on file to calculate BMI.  Physical Exam Vitals reviewed.  Constitutional:  General: She is not in acute distress. Appearance: Normal appearance.  HENT:  Head: Normocephalic and atraumatic.  Right Ear: External ear normal.  Left Ear: External ear normal.  Nose: Nose normal.  Mouth/Throat:  Mouth: Mucous membranes are moist.  Pharynx: Oropharynx is clear.  Eyes:  General: No scleral icterus. Extraocular Movements: Extraocular movements intact.  Conjunctiva/sclera: Conjunctivae normal.  Pupils: Pupils are equal, round, and reactive to light.  Cardiovascular:  Rate and Rhythm: Normal rate and regular rhythm.  Pulses: Normal pulses.  Heart sounds: Normal heart sounds.  Pulmonary:  Effort: Pulmonary effort is normal. No respiratory distress.  Breath sounds: Normal breath sounds.  Abdominal:  General: Bowel sounds are normal.  Palpations: Abdomen is soft.  Tenderness: There is no abdominal tenderness.  Musculoskeletal:  General: No swelling,  tenderness or deformity. Normal range of motion.  Cervical back: Normal range of motion and neck supple.  Skin: General: Skin is warm and dry.  Coloration: Skin is not jaundiced.  Neurological:  General: No focal deficit present.  Mental Status: She is alert and oriented to person, place, and time.  Psychiatric:  Mood and Affect: Mood normal.  Behavior: Behavior normal.    Breast: There is no palpable mass in either breast. There is no palpable axillary, supraclavicular, or cervical lymphadenopathy.  Labs, Imaging and Diagnostic Testing:  Assessment and Plan:   Diagnoses and all orders for this visit:  Malignant neoplasm of lower-outer quadrant of left breast of female, estrogen receptor positive (CMS-HCC) - [facility] Case Posting Request; Future    The patient appears to have a 1.7 cm cancer in the lower outer quadrant of the left breast with clinically negative nodes. I have discussed with her in detail the different options for treatment and at this point she favors breast conservation which I feel is very reasonable. She is also a good candidate for sentinel node biopsy. I have discussed with her in detail the risks and benefits of the operation as well as some of the technical aspects including the use of a radioactive seed for localization and she understands and wishes to proceed. She will meet with medical and radiation oncology also to discuss adjuvant therapy. We will begin surgical planning

## 2021-12-09 ENCOUNTER — Encounter (HOSPITAL_COMMUNITY): Payer: Self-pay | Admitting: General Surgery

## 2021-12-09 DIAGNOSIS — H16143 Punctate keratitis, bilateral: Secondary | ICD-10-CM | POA: Diagnosis not present

## 2021-12-09 LAB — SURGICAL PATHOLOGY

## 2021-12-11 ENCOUNTER — Telehealth: Payer: Self-pay | Admitting: *Deleted

## 2021-12-11 ENCOUNTER — Encounter: Payer: Self-pay | Admitting: *Deleted

## 2021-12-11 NOTE — Telephone Encounter (Signed)
Received order for oncotype testing. Requisition faxed to pathology and exact sciences 

## 2021-12-16 ENCOUNTER — Encounter (HOSPITAL_COMMUNITY): Payer: Self-pay

## 2021-12-16 DIAGNOSIS — C50912 Malignant neoplasm of unspecified site of left female breast: Secondary | ICD-10-CM | POA: Diagnosis not present

## 2021-12-19 DIAGNOSIS — Z170 Estrogen receptor positive status [ER+]: Secondary | ICD-10-CM | POA: Diagnosis not present

## 2021-12-19 DIAGNOSIS — C50512 Malignant neoplasm of lower-outer quadrant of left female breast: Secondary | ICD-10-CM | POA: Diagnosis not present

## 2021-12-22 ENCOUNTER — Telehealth: Payer: Self-pay | Admitting: *Deleted

## 2021-12-22 ENCOUNTER — Inpatient Hospital Stay: Payer: Medicare HMO | Attending: Hematology and Oncology | Admitting: Hematology and Oncology

## 2021-12-22 ENCOUNTER — Encounter: Payer: Self-pay | Admitting: *Deleted

## 2021-12-22 ENCOUNTER — Telehealth: Payer: Self-pay | Admitting: Radiation Oncology

## 2021-12-22 ENCOUNTER — Other Ambulatory Visit: Payer: Self-pay

## 2021-12-22 DIAGNOSIS — Z170 Estrogen receptor positive status [ER+]: Secondary | ICD-10-CM

## 2021-12-22 DIAGNOSIS — C50512 Malignant neoplasm of lower-outer quadrant of left female breast: Secondary | ICD-10-CM | POA: Insufficient documentation

## 2021-12-22 NOTE — Assessment & Plan Note (Addendum)
11/20/2021:Screening mammogram detected left breast distortion.  Ultrasound revealed 2 areas of abnormality both of which were biopsied and were found to be benign fibroadenoma and fibrocystic change.  Stereotactic biopsy of the distortion revealed grade 1 IDC with DCIS ER 98%, PR 0%, Ki-67 10%, HER2 negative  12/08/2021: Left lumpectomy: Grade 1 IDC 0.9 cm, intermediate grade DCIS, margins negative, lymphovascular invasion present, 1/6 lymph nodes positive with nodal extension, ER 98%, PR 0%, HER2 negative 1+, Ki-67 10%  Pathology counseling: I discussed the final pathology report of the patient provided  a copy of this report. I discussed the margins as well as lymph node surgeries. We also discussed the final staging along with previously performed ER/PR and HER-2/neu testing.  Treatment plan: 1. Oncotype DX testing: Score 22 (ROR 18%) no chemotherapy 2. Adjuvant radiation therapy followed by 3. Adjuvant antiestrogen therapy  Return to clinic after radiation is complete.

## 2021-12-22 NOTE — Telephone Encounter (Signed)
Received oncotype score of 22. Physician team notified. Referral placed for pt to see Dr. Isidore Moos

## 2021-12-22 NOTE — Progress Notes (Signed)
Patient Care Team: Lujean Amel, MD as PCP - General (Family Medicine) Mauro Kaufmann, RN as Oncology Nurse Navigator Rockwell Germany, RN as Oncology Nurse Navigator Jovita Kussmaul, MD as Consulting Physician (General Surgery) Nicholas Lose, MD as Consulting Physician (Hematology and Oncology) Eppie Gibson, MD as Attending Physician (Radiation Oncology)  DIAGNOSIS:  Encounter Diagnosis  Name Primary?   Malignant neoplasm of lower-outer quadrant of left breast of female, estrogen receptor positive ([place])     SUMMARY OF ONCOLOGIC HISTORY: Oncology History  Malignant neoplasm of lower-outer quadrant of left breast of female, estrogen receptor positive ([place])  11/20/2021 Initial Diagnosis   Screening mammogram detected left breast distortion.  Ultrasound revealed 2 areas of abnormality both of which were biopsied and were found to be benign fibroadenoma and fibrocystic change.  Stereotactic biopsy of the distortion revealed grade 1 IDC with DCIS ER 98%, PR 0%, Ki-67 10%, HER2 negative   11/26/2021 Cancer Staging   Staging form: Breast, AJCC 8th Edition - Clinical stage from 11/26/2021: Stage IA (cT1c, cN0, cM0, G1, ER+, PR-, HER2-) - Signed by Nicholas Lose, MD on 11/26/2021 Stage prefix: Initial diagnosis Histologic grading system: 3 grade system     CHIEF COMPLIANT: Follow-up after surgery  INTERVAL HISTORY: Amber Bowman is a 66 y.o. female is here because of recent diagnosis of left breast cancer. She presents to the clinic for a follow-up after surgery. She states surgery went well.  ALLERGIES:  has No Known Allergies.  MEDICATIONS:  Current Outpatient Medications  Medication Sig Dispense Refill   aspirin 81 MG EC tablet Take 81 mg by mouth daily.     atorvastatin (LIPITOR) 10 MG tablet Take 10 mg by mouth at bedtime.     Cranberry 500 MG TABS Take 500 mg by mouth 2 (two) times daily.     felbamate (FELBATOL) 600 MG tablet Take 0.5 tablets (300 mg total) by mouth 2 (two)  times daily. 90 tablet 1   levETIRAcetam (KEPPRA) 500 MG tablet TAKE 1 TABLET BY MOUTH TWICE A DAY (Patient taking differently: Take 750 mg by mouth 2 (two) times daily.) 180 tablet 0   Loteprednol Etabonate 0.5 % GEL Place 1 drop into the right eye 3 (three) times daily.     Omega-3 Fatty Acids (FISH OIL) 1000 MG CPDR Take 1,000 mg by mouth 2 (two) times daily.     omeprazole (PRILOSEC) 40 MG capsule Take 40 mg by mouth every morning.     oxyCODONE (ROXICODONE) 5 MG immediate release tablet Take 1 tablet (5 mg total) by mouth every 6 (six) hours as needed for severe pain. 10 tablet 0   PRESCRIPTION MEDICATION Apply 1 Application topically 4 (four) times daily. Compound apply to lips Amitriptyline -Gabapentin-Lidocaine Cream 5/10/5%     Propylene Glycol, PF, (SYSTANE COMPLETE PF) 0.6 % SOLN Place 1 drop into the left eye 3 (three) times daily.     topiramate (TOPAMAX) 200 MG tablet TAKE 1 TABLET BY MOUTH TWICE A DAY (Patient taking differently: Take 200 mg by mouth 2 (two) times daily.) 180 tablet 3   TURMERIC CURCUMIN PO Take 500 mg by mouth 2 (two) times daily.     No current facility-administered medications for this visit.    PHYSICAL EXAMINATION: ECOG PERFORMANCE STATUS: 1 - Symptomatic but completely ambulatory  Vitals:   12/22/21 1505  BP: 127/68  Pulse: 75  Resp: 18  Temp: 97.9 F (36.6 C)  SpO2: 98%   Filed Weights   12/22/21 1505  Weight: 164 lb 4.8 oz (74.5 kg)      LABORATORY DATA:  I have reviewed the data as listed    Latest Ref Rng & Units 11/26/2021   12:06 PM 06/25/2021    2:14 PM 08/25/2019   10:35 AM  CMP  Glucose 70 - 99 mg/dL 97  104  100   BUN 8 - 23 mg/dL _0 Creatinine 0.44 - 1.00 mg/dL 0.95  1.60  0.81   Sodium 135 - 145 mmol/L 142  138  140   Potassium 3.5 - 5.1 mmol/L 4.5  3.7  3.5   Chloride 98 - 111 mmol/L 112  108  109   CO2 22 - 32 mmol/L 25   23   Calcium 8.9 - 10.3 mg/dL 9.0   9.0   Total Protein 6.5 - 8.1 g/dL 6.9     Total  Bilirubin 0.3 - 1.2 mg/dL 0.3     Alkaline Phos 38 - 126 U/L 114     AST 15 - 41 U/L 14     ALT 0 - 44 U/L 13       Lab Results  Component Value Date   WBC 6.8 11/26/2021   HGB 12.8 11/26/2021   HCT 39.2 11/26/2021   MCV 92.0 11/26/2021   PLT 269 11/26/2021   NEUTROABS 3.3 11/26/2021    ASSESSMENT & PLAN:  Malignant neoplasm of lower-outer quadrant of left breast of female, estrogen receptor positive ([place]) 11/20/2021:Screening mammogram detected left breast distortion.  Ultrasound revealed 2 areas of abnormality both of which were biopsied and were found to be benign fibroadenoma and fibrocystic change.  Stereotactic biopsy of the distortion revealed grade 1 IDC with DCIS ER 98%, PR 0%, Ki-67 10%, HER2 negative  12/08/2021: Left lumpectomy: Grade 1 IDC 0.9 cm, intermediate grade DCIS, margins negative, lymphovascular invasion present, 1/6 lymph nodes positive with nodal extension, ER 98%, PR 0%, HER2 negative 1+, Ki-67 10%  Pathology counseling: I discussed the final pathology report of the patient provided  a copy of this report. I discussed the margins as well as lymph node surgeries. We also discussed the final staging along with previously performed ER/PR and HER-2/neu testing.  Treatment plan: 1. Oncotype DX testing: Score 22 (ROR 18%) no chemotherapy 2. Adjuvant radiation therapy followed by 3. Adjuvant antiestrogen therapy  Return to clinic after radiation is complete.    No orders of the defined types were placed in this encounter.  The patient has a good understanding of the overall plan. she agrees with it. she will call with any problems that may develop before the next visit here. Total time spent: 30 mins including face to face time and time spent for planning, charting and co-ordination of care   Harriette Ohara, MD 12/22/21    I Gardiner Coins am scribing for Dr. Lindi Adie  I have reviewed the above documentation for accuracy and completeness, and I agree with  the above.

## 2021-12-22 NOTE — Telephone Encounter (Signed)
Called patient to schedule a consultation w. Dr. Squire. No answer, LVM for a return call.  

## 2021-12-23 ENCOUNTER — Telehealth: Payer: Self-pay | Admitting: Radiation Oncology

## 2021-12-23 NOTE — Telephone Encounter (Signed)
Called patient to schedule a consultation w. Dr. Squire. No answer, LVM for a return call.  

## 2021-12-25 ENCOUNTER — Encounter (HOSPITAL_COMMUNITY): Payer: Self-pay

## 2021-12-29 ENCOUNTER — Encounter: Payer: Self-pay | Admitting: *Deleted

## 2021-12-30 NOTE — Progress Notes (Signed)
Location of Breast Cancer: Malignant neoplasm of lower-outer quadrant of left breast of female, estrogen receptor positive ([place]  Histology per Pathology Report: 12/08/2021 Clinical History: left breast cancer (cm)      FINAL MICROSCOPIC DIAGNOSIS:   A. BREAST, LEFT, LUMPECTOMY:  - Invasive ductal carcinoma, 0.9 cm, grade 1 (pT1b)  - Ductal carcinoma in situ, cribriform, intermediate grade  - Margins, invasive carcinoma: All margins negative for invasive  carcinoma      Closest margin invasive carcinoma:  Superior, 1 cm  - Margins, DCIS:  All margins negative for DCIS      Closest margin DCIS:  Superior, 1 cm  - Lymphovascular invasion:  Present  - Previous biopsy site and biopsy clip  - See oncology table   B. LYMPH NODE, LEFT AXILLARY #1, SENTINEL, EXCISION:  - Metastatic carcinoma in one lymph node (1/1)  - Metastasis is 0.8 cm  - Negative for extranodal extension   C. LYMPH NODE, LEFT AXILLARY #2, SENTINEL, EXCISION:  - One lymph node negative for metastatic carcinoma (0/1).   D. LYMPH NODE, LEFT AXILLARY, SENTINEL, EXCISION:  - One lymph node negative for metastatic carcinoma (0/1).   E. LYMPH NODE, LEFT AXILLARY, SENTINEL, EXCISION:  - One lymph node negative for metastatic carcinoma (0/1).   F. LYMPH NODE, LEFT AXILLARY, SENTINEL, EXCISION:  - One lymph node negative for metastatic carcinoma (0/1).   G. LYMPH NODE, LEFT AXILLARY #3, SENTINEL, EXCISION:  - One lymph node negative for metastatic carcinoma (0/1).   ONCOLOGY TABLE:  INVASIVE CARCINOMA OF THE BREAST:  Resection  Procedure: Left breast lumpectomy and six sentinel lymph nodes  Specimen Laterality: Left breast  Histologic Type: Ductal  Histologic Grade:       Glandular (Acinar)/Tubular Differentiation: 3       Nuclear Pleomorphism: 1       Mitotic Rate: 1       Overall Grade: 1  Tumor Size: 0.9 cm, glass slide measurement  Ductal Carcinoma In Situ: Intermediate grade  Tumor Extent: Not applicable   Treatment Effect in the Breast: No known presurgical therapy  Margins: All margins negative for invasive carcinoma       Distance from Closest Margin (mm): 10 mm       Specify Closest Margin (required only if <58m): Superior  DCIS Margins: All margins negative for DCIS       Distance from Closest Margin (mm): 10 mm       Specify Closest Margin (required only if <18m: Superior  Regional Lymph Nodes:       Number of Lymph Nodes Examined: 6       Number of Sentinel Nodes Examined: 6       Number of Lymph Nodes with Macrometastases (>2 mm): 1       Number of Lymph Nodes with Micrometastases: 0       Number of Lymph Nodes with Isolated Tumor Cells (=0.2 mm or =200  cells): 0       Size of Largest Metastatic Deposit (mm): 8 mm       Extranodal Extension: Not identified  Distant Metastasis:       Distant Site(s) Involved: Not applicable  Breast Biomarker Testing Performed on Previous Biopsy:       Testing Performed on Case Number: [id number]             Estrogen Receptor: 98%, positive, strong staining             Progesterone Receptor: 0%, negative  HER2: Negative with IHC (1+)             Ki-67: 10%  Pathologic Stage Classification (pTNM, AJCC 8th Edition): pT1b, pN1a  Representative Tumor Block: A4  (v4.5.0.0)   GROSS DESCRIPTION:  A.  Specimen type: Received fresh is a left lumpectomy specimen received  with a radiographic seed that is identified and removed.  The specimen  is placed in formalin at 1:33 PM on 12/08/2021.  Skin: The anterior margin includes an attached ellipse of skin that  measures 3.3 x 1.2 cm.  The cutaneous surface is tan and wrinkled  without discrete lesions/masses.  Size: 8.7 x 5.2 x 4.0 cm (M-L x S-I x A-P)  Orientation: Anterior = green, posterior = black, superior = red,  inferior = blue, medial = yellow, and lateral = orange  Cut surface: The specimen is sectioned from medial to lateral and a  Faxitron image is taken revealing a Venus clip  within a fibrotic to  hemorrhagic area of tissue without a discrete mass/lesion measuring 3.1  x 2.7 x 1.4 cm.  Margins: The fibrous tissue is 1.9 cm from the anterior/skin margin, 2.4  cm from the posterior margin, 1.0 cm from the superior margin, 1.5 cm  from the inferior margin, 1.4 cm from the medial margin, and 2.1 cm from  the lateral margin.  Block summary:  A1-A5 central lesion, no true margin (A1 = clip site)  A6 Superior and inferior margins closest to lesion  A7 anterior and posterior margins closest to lesion  A8 lateral and medial margins closest to lesion   B.  Received fresh is a 1.8 x 1.6 x 1.0 cm lymph node candidate encased  in a layer of fat.  The candidate is serially sectioned and entirely  submitted in B1-B2.   C.  Received fresh is a 0.3 x 0.2 x 0.1 cm lymph node candidate encased  in a layer of fat.  The candidate is entirely submitted in C1.   D.  Received fresh is a 1.0 x 0.8 x 0.5 cm lymph node candidate.  The  candidate is serially sectioned and entirely submitted in D1.   E.  Received fresh is a 1.4 x 1.0 x 0.6 cm lymph node candidate.  The  candidate is serially sectioned and entirely submitted in E1.   F.  Received fresh is a 1.0 x 0.6 x 0.4 cm lymph node candidate.  The  candidate is longitudinally bisected and entirely submitted in F1.   G.  Received fresh is a 0.9 x 0.6 x 0.3 cm lymph node candidate.  The  candidate is entirely submitted in G1.  (KW, 12/08/2021)   Receptor Status: ER(98%), PR (0%), Her2-neu (1+), Ki-67(10%)  Did patient present with symptoms (if so, please note symptoms) or was this found on screening mammography? Found on mammogram  Past/Anticipated interventions by surgeon, if any: 12/08/21 LEFT BREAST LUMPECTOMY WITH RADIOACTIVE SEED AND SENTINEL LYMPH NODE BIOPSY   Past/Anticipated interventions by medical oncology, if any:  Treatment plan: 1. Oncotype DX testing: Score 22 (ROR 18%) no chemotherapy 2. Adjuvant radiation  therapy followed by 3. Adjuvant antiestrogen therapy Lymphedema issues, if any:  none   Pain issues, if any:  yes intermittent  SAFETY ISSUES: Prior radiation? no Pacemaker/ICD? no Possible current pregnancy?no Is the patient on methotrexate? no  Current Complaints / other details:  none at this time. Does have some questions for Dr.Squire.  Vitals:   01/06/22 1238  BP: (!) 146/68  Pulse:  68  Resp: 18  Temp: 97.8 F (36.6 C)  SpO2: 100%

## 2022-01-01 ENCOUNTER — Ambulatory Visit: Payer: Medicare HMO | Attending: General Surgery | Admitting: Physical Therapy

## 2022-01-01 ENCOUNTER — Other Ambulatory Visit: Payer: Self-pay

## 2022-01-01 ENCOUNTER — Encounter: Payer: Self-pay | Admitting: Physical Therapy

## 2022-01-01 DIAGNOSIS — R293 Abnormal posture: Secondary | ICD-10-CM | POA: Diagnosis not present

## 2022-01-01 DIAGNOSIS — C50412 Malignant neoplasm of upper-outer quadrant of left female breast: Secondary | ICD-10-CM | POA: Diagnosis not present

## 2022-01-01 DIAGNOSIS — Z483 Aftercare following surgery for neoplasm: Secondary | ICD-10-CM | POA: Diagnosis not present

## 2022-01-01 DIAGNOSIS — Z170 Estrogen receptor positive status [ER+]: Secondary | ICD-10-CM | POA: Insufficient documentation

## 2022-01-01 NOTE — Patient Instructions (Signed)
[facility]  [address], [address]  [place] [address]  [phone]  After Breast Cancer Class It is recommended you attend the ABC class to be educated on lymphedema risk reduction. This class is free of charge and lasts for 1 hour. It is a 1-time class. You will need to download the Webex app either on your phone or computer. We will send you a link the night before or the morning of the class. You should be able to click on that link to join the class. This is not a confidential class. You don't have to turn your camera on, but other participants may be able to see your email address.  Scar massage You can begin gentle scar massage to you incision sites. Gently place one hand on the incision and move the skin (without sliding on the skin) in various directions. Do this for a few minutes and then you can gently massage either coconut oil or vitamin E cream into the scars.  Compression garment You should continue wearing your compression bra until you feel like you no longer have swelling.  Home exercise Program Continue doing the exercises you were given until you feel like you can do them without feeling any tightness at the end.   Walking Program Studies show that 30 minutes of walking per day (fast enough to elevate your heart rate) can significantly reduce the risk of a cancer recurrence. If you can't walk due to other medical reasons, we encourage you to find another activity you could do (like a stationary bike or water exercise).  Posture After breast cancer surgery, people frequently sit with rounded shoulders posture because it puts their incisions on slack and feels better. If you sit like this and scar tissue forms in that position, you can become very tight and have pain sitting or standing with good posture. Try to be aware of your posture and sit and stand up tall to heal properly.  Follow up PT: It is recommended you return every 3 months for the  first 3 years following surgery to be assessed on the SOZO machine for an L-Dex score. This helps prevent clinically significant lymphedema in 95% of patients. These follow up screens are 10 minute appointments that you are not billed for. You are scheduled for March 16, 2022 at 4:50 pm.

## 2022-01-01 NOTE — Therapy (Signed)
OUTPATIENT PHYSICAL THERAPY BREAST CANCER POST OP FOLLOW UP   Patient Name: Amber Bowman MRN: [id number] DOB:15-Nov-1955, 66 y.o., female Today's Date: 01/01/2022   PT End of Session - 01/01/22 0906     Visit Number 2    Number of Visits 2    PT Start Time 0904    PT Stop Time 0940    PT Time Calculation (min) 36 min    Activity Tolerance Patient tolerated treatment well    Behavior During Therapy [facility] for tasks assessed/performed             Past Medical History:  Diagnosis Date   Anemia    Cancer ([place])    left breast   Cardiomyopathy ([place]) 06/08/2019   CHF (congestive heart failure) ([place]) 07/2019   per denies this dx   Colon polyps 2014   Condyloma    Depression    Epilepsy ([place])    last seizure reported was 9//2023   GERD (gastroesophageal reflux disease)    Osteopenia 04/2018   T score -1.8 FRAX 9.5% / 1.1% overall stable from prior DEXA   Seizures ([place])    last on 11/2021,  "few seizures per month " per husband   Thrombocytopenia ([place])    Past Surgical History:  Procedure Laterality Date   BREAST LUMPECTOMY WITH RADIOACTIVE SEED AND SENTINEL LYMPH NODE BIOPSY Left 12/08/2021   Procedure: LEFT BREAST LUMPECTOMY WITH RADIOACTIVE SEED AND SENTINEL LYMPH NODE BIOPSY;  Surgeon: Jovita Kussmaul, MD;  Location: [place];  Service: General;  Laterality: Left;   CATARACT EXTRACTION Bilateral 03/10/2015   CYSTOSCOPY W/ RETROGRADES Right 06/25/2021   Procedure: CYSTOSCOPY WITH RIGHT RETROGRADE PYELOGRAM, REMOVAL OF BLADDER STONE;  Surgeon: Raynelle Bring, MD;  Location: [facility];  Service: Urology;  Laterality: Right;   CYSTOSCOPY/URETEROSCOPY/HOLMIUM LASER/STENT PLACEMENT Left 09/04/2019   Procedure: CYSTOSCOPY/RETROGRADE/ URETEROSCOPY/HOLMIUM LASER/STENT PLACEMENT/ REMOVAL OF NEPHROSTOMY TUBE;  Surgeon: Raynelle Bring, MD;  Location: [facility];  Service: Urology;  Laterality: Left;   IR NEPHROSTOMY EXCHANGE LEFT  08/17/2019   IR NEPHROSTOMY PLACEMENT LEFT  06/28/2019    Patient Active Problem List   Diagnosis Date Noted   Malignant neoplasm of lower-outer quadrant of left breast of female, estrogen receptor positive ([place]) 19/50/9326   Chronic systolic CHF (congestive heart failure) ([place]) 07/09/2019   Overweight (BMI 25.0-29.9) 07/08/2019   COVID-19 virus infection 07/08/2019   Septic shock due to Escherichia coli ([place]) 07/08/2019   Acute pyelonephritis 07/08/2019   Unspecified mood (affective) disorder ([place]) 07/08/2019   Leukocytosis 07/08/2019   Thrombocytopenia ([place]) 07/08/2019   Acute renal failure (ARF) ([place]) 06/27/2019   Partial epilepsy with impairment of consciousness, intractable ([place]) 11/03/2013   Condyloma    Epilepsy ([place])    Depression     REFERRING PROVIDER: Dr. Autumn Messing  REFERRING DIAG: Left breast cancer  THERAPY DIAG:  Malignant neoplasm of upper-outer quadrant of left breast in female, estrogen receptor positive ([place])  Abnormal posture  Aftercare following surgery for neoplasm  Rationale for Evaluation and Treatment Rehabilitation  ONSET DATE: 12/08/2021  SUBJECTIVE:  SUBJECTIVE STATEMENT: Patient reports she underwent a left lumpectomy and sentinel node biopsy on 12/08/2021. She had 1 of 6 axillary lymph nodes positive. Her Oncotype score was 22 so she will proceed to radiation and not have chemotherapy. This will be followed by anti-estrogen therapy.  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 She underwent a left lumpectomy and sentinel node biopsy (1/6 nodes positive) on 12/08/2021. It is ER positive, PR negative, and HER2 negative with a Ki67 of 10%. She also has epilepsy.  PATIENT GOALS:  Reassess how my recovery is going related to arm function, pain, and  swelling.  PAIN:  Are you having pain? Yes: NPRS scale: 2/10 Pain location: Left posterior upper arm Pain description: soreness Aggravating factors: nothing Relieving factors: nothing  PRECAUTIONS: Recent Surgery, left UE Lymphedema risk, Other: Epilepsy  ACTIVITY LEVEL / LEISURE: She is walking daily for an hour (about 3 miles)   OBJECTIVE:   PATIENT SURVEYS:  QUICK DASH:  Quick Dash - 01/01/22 0001     Open a tight or new jar No difficulty    Do heavy household chores (wash walls, wash floors) No difficulty    Carry a shopping bag or briefcase No difficulty    Wash your back No difficulty    Use a knife to cut food No difficulty    Recreational activities in which you take some force or impact through your arm, shoulder, or hand (golf, hammering, tennis) No difficulty    During the past week, to what extent has your arm, shoulder or hand problem interfered with your normal social activities with family, friends, neighbors, or groups? Slightly    During the past week, to what extent has your arm, shoulder or hand problem limited your work or other regular daily activities Slightly    Arm, shoulder, or hand pain. Mild    Tingling (pins and needles) in your arm, shoulder, or hand Mild    Difficulty Sleeping Mild difficulty    DASH Score 11.36 %              OBSERVATIONS: Left axillary and breast incisions both appear to be healing well. There is what appears to be an organized seroma or cluster of scar tissue present just superior to her axillary incision. It is hard and tender to palpation.  POSTURE:  Forward head and rounded shoulders posture  LYMPHEDEMA ASSESSMENT:   UPPER EXTREMITY AROM/PROM:   A/PROM RIGHT   eval    Shoulder extension 48  Shoulder flexion 146  Shoulder abduction 142  Shoulder internal rotation 74  Shoulder external rotation 80                          (Blank rows = not tested)   A/PROM LEFT   eval LEFT 01/01/2022  Shoulder extension  45 55  Shoulder flexion 142 147  Shoulder abduction 145 155  Shoulder internal rotation 62 62  Shoulder external rotation 80 77                          (Blank rows = not tested)     CERVICAL AROM: All within normal limits   UPPER EXTREMITY STRENGTH: WFL     LYMPHEDEMA ASSESSMENTS:    LANDMARK RIGHT   eval RIGHT 01/01/2022  10 cm proximal to olecranon process 27.3 27.8  Olecranon process 24 24.2  10 cm  proximal to ulnar styloid process 21 20.5  Just proximal to ulnar styloid process 14.5 14.5  Across hand at thumb web space 18 17.8  At base of 2nd digit 6.1 5.9  (Blank rows = not tested)   LANDMARK LEFT   eval LEFT 01/01/2022  10 cm proximal to olecranon process 25.2 25.1  Olecranon process 23.9 24.2  10 cm proximal to ulnar styloid process 19.4 19.1  Just proximal to ulnar styloid process 14.5 14.4  Across hand at thumb web space 17.5 17.7  At base of 2nd digit 5.7 5.7  (Blank rows = not tested)     Surgery type/Date: Left lumpectomy and sentinel node biopsy 12/08/2021 Number of lymph nodes removed: 6 Current/past treatment (chemo, radiation, hormone therapy): none Other symptoms:  Heaviness/tightness No Pain Yes Pitting edema No Infections No Decreased scar mobility Yes Stemmer sign No  PATIENT EDUCATION:  Education details: HEP and lymphedema risk and ways to reduce risk Person educated: Patient Education method: Customer service manager Education comprehension: verbalized understanding and returned demonstration  HOME EXERCISE PROGRAM: Reviewed previously given post op HEP.   ASSESSMENT:  CLINICAL IMPRESSION: Patient is doing very well s/p left lumpectomy and sentinel node biopsy (1/6 nodes positive) on 12/08/2021. She has regained full shoulder and function (although had some difficulty completing the DASH form). Her incisions are well healed and there is no sign of infection. She will be at significant risk for lymphedema as she will likely  undergo radiation to the axillary region so she was educated on that. She has no needs at this time for physical therapy but will return for SOZO screens.  Pt will benefit from skilled therapeutic intervention to improve on the following deficits: Decreased knowledge of precautions, impaired UE functional use, pain, decreased ROM, postural dysfunction.   PT treatment/interventions: ADL/Self care home management, Therapeutic exercises, Therapeutic activity, Patient/Family education, Self Care, and Manual therapy   GOALS: Goals reviewed with patient? Yes  LONG TERM GOALS:  (STG=LTG)  GOALS Name Target Date  Goal status  1 Pt will demonstrate she has regained full shoulder ROM and function post operatively compared to baselines.  Baseline: 01/21/2022 MET     PLAN:  PT FREQUENCY/DURATION: N/A  PLAN FOR NEXT SESSION: D/C but will continue SOZO screens every 3 months   [facility]  [address]  [place] [address]  [phone]  After Breast Cancer Class It is recommended you attend the ABC class to be educated on lymphedema risk reduction. This class is free of charge and lasts for 1 hour. It is a 1-time class. You will need to download the Webex app either on your phone or computer. We will send you a link the night before or the morning of the class. You should be able to click on that link to join the class. This is not a confidential class. You don't have to turn your camera on, but other participants may be able to see your email address.  Scar massage You can begin gentle scar massage to you incision sites. Gently place one hand on the incision and move the skin (without sliding on the skin) in various directions. Do this for a few minutes and then you can gently massage either coconut oil or vitamin E cream into the scars.  Compression garment You should continue wearing your compression bra until you feel like you no longer have  swelling.  Home exercise Program Continue doing the exercises you were given until  you feel like you can do them without feeling any tightness at the end.   Walking Program Studies show that 30 minutes of walking per day (fast enough to elevate your heart rate) can significantly reduce the risk of a cancer recurrence. If you can't walk due to other medical reasons, we encourage you to find another activity you could do (like a stationary bike or water exercise).  Posture After breast cancer surgery, people frequently sit with rounded shoulders posture because it puts their incisions on slack and feels better. If you sit like this and scar tissue forms in that position, you can become very tight and have pain sitting or standing with good posture. Try to be aware of your posture and sit and stand up tall to heal properly.  Follow up PT: It is recommended you return every 3 months for the first 3 years following surgery to be assessed on the SOZO machine for an L-Dex score. This helps prevent clinically significant lymphedema in 95% of patients. These follow up screens are 10 minute appointments that you are not billed for.  PHYSICAL THERAPY DISCHARGE SUMMARY  Visits from Start of Care: 2  Current functional level related to goals / functional outcomes: Goals met; see above for objective findings   Remaining deficits: None except scar tissue causing some tenderness just superior to her axillary incision   Education / Equipment: HEP; lymphedema risk reduction education   Patient agrees to discharge. Patient goals were met. Patient is being discharged due to meeting the stated rehab goals.  Annia Friendly, [place] 01/01/22 9:43 AM

## 2022-01-05 NOTE — Progress Notes (Signed)
Radiation Oncology         [phone] ________________________________  Name: Amber Bowman MRN: [id number]  Date: 01/06/2022  DOB: [birth year]  Follow-Up Visit Note  Outpatient  CC: Lujean Amel, MD  Nicholas Lose, MD  Diagnosis:   No diagnosis found.   Stage IA (cT1c, cN0, cM0) Left Breast LOQ, Invasive ductal carcinoma with intermediate grade DCIS and LVI, ER+ / PR- / Her2-, Grade 1  CHIEF COMPLAINT: Here to discuss management of left breast cancer  Narrative:  The patient returns today for follow-up.     Since breast clinic consultation date of 11/26/21, the patient opted to proceed with left breast lumpectomy with nodal biopsies on 12/08/21 under the care of Dr. Marlou Starks. Pathology from the procedure revealed: tumor size of 0.9 cm; histology of grade 1 invasive ductal carcinoma with intermediate grade DCIS and LVI; all margins negative for both invasive or in-situ carcinoma; margin status to invasive disease of 1 cm from the superior margin; margin status to in situ disease of 1 cm from the superior margin; nodal status of 1/6 left axillary sentinel lymph node excisions positive for metastatic carcinoma measuring 0.8 cm (negative for extranodal extension);  ER status: 98% positive with strong staining intensity; PR status 0% negative Proliferation marker Ki67 at 10%; Her2 status negative; Grade 1.  Oncotype DX was obtained on the final surgical sample and the recurrence score of 22 predicts a risk of recurrence outside the breast over the next 9 years of 18%, if the patient's only systemic therapy is an antiestrogen for 5 years. It also predicts no significant benefit from chemotherapy.  Given Oncotype results, the patient will return to Dr. Lindi Adie following XRT to discuss antiestrogen treatment options.   Post-operatively, the patient was noted to be healing nicely and without signs of infection during her most recent follow-up visit with Dr. Marlou Starks on 12/23/21.   Symptomatically,  the patient reports: ***        ALLERGIES:  has No Known Allergies.  Meds: Current Outpatient Medications  Medication Sig Dispense Refill   aspirin 81 MG EC tablet Take 81 mg by mouth daily.     atorvastatin (LIPITOR) 10 MG tablet Take 10 mg by mouth at bedtime.     Cranberry 500 MG TABS Take 500 mg by mouth 2 (two) times daily.     felbamate (FELBATOL) 600 MG tablet Take 0.5 tablets (300 mg total) by mouth 2 (two) times daily. 90 tablet 1   levETIRAcetam (KEPPRA) 500 MG tablet TAKE 1 TABLET BY MOUTH TWICE A DAY (Patient taking differently: Take 750 mg by mouth 2 (two) times daily.) 180 tablet 0   Loteprednol Etabonate 0.5 % GEL Place 1 drop into the right eye 3 (three) times daily.     Omega-3 Fatty Acids (FISH OIL) 1000 MG CPDR Take 1,000 mg by mouth 2 (two) times daily.     omeprazole (PRILOSEC) 40 MG capsule Take 40 mg by mouth every morning.     oxyCODONE (ROXICODONE) 5 MG immediate release tablet Take 1 tablet (5 mg total) by mouth every 6 (six) hours as needed for severe pain. 10 tablet 0   PRESCRIPTION MEDICATION Apply 1 Application topically 4 (four) times daily. Compound apply to lips Amitriptyline -Gabapentin-Lidocaine Cream 5/10/5%     Propylene Glycol, PF, (SYSTANE COMPLETE PF) 0.6 % SOLN Place 1 drop into the left eye 3 (three) times daily.     topiramate (TOPAMAX) 200 MG tablet TAKE 1 TABLET BY MOUTH TWICE A DAY (  Patient taking differently: Take 200 mg by mouth 2 (two) times daily.) 180 tablet 3   TURMERIC CURCUMIN PO Take 500 mg by mouth 2 (two) times daily.     No current facility-administered medications for this encounter.    Physical Findings:  vitals were not taken for this visit. .     General: Alert and oriented, in no acute distress HEENT: Head is normocephalic. Extraocular movements are intact. Oropharynx is clear. Neck: Neck is supple, no palpable cervical or supraclavicular lymphadenopathy. Heart: Regular in rate and rhythm with no murmurs, rubs, or  gallops. Chest: Clear to auscultation bilaterally, with no rhonchi, wheezes, or rales. Abdomen: Soft, nontender, nondistended, with no rigidity or guarding. Extremities: No cyanosis or edema. Lymphatics: see Neck Exam Musculoskeletal: symmetric strength and muscle tone throughout. Neurologic: No obvious focalities. Speech is fluent.  Psychiatric: Judgment and insight are intact. Affect is appropriate. Breast exam reveals ***  Lab Findings: Lab Results  Component Value Date   WBC 6.8 11/26/2021   HGB 12.8 11/26/2021   HCT 39.2 11/26/2021   MCV 92.0 11/26/2021   PLT 269 11/26/2021    _0 @  Radiographic Findings: MM Breast Surgical Specimen  Result Date: 12/08/2021 CLINICAL DATA:  Patient is post surgical excision left breast. EXAM: SPECIMEN RADIOGRAPH OF THE LEFT BREAST COMPARISON:  Previous exam(s). FINDINGS: Status post excision of the left breast. The radioactive seed and venous shaped biopsy marker clip are present, completely intact, and were marked for pathology. Results called to the OR at the time of dictation. IMPRESSION: Specimen radiograph of the left breast. Electronically Signed   By: Marin Olp M.D.   On: 12/08/2021 12:59   Impression/Plan: We discussed adjuvant radiotherapy today.  I recommend *** in order to ***.  I reviewed the logistics, benefits, risks, and potential side effects of this treatment in detail. Risks may include but not necessary be limited to acute and late injury tissue in the radiation fields such as skin irritation (change in color/pigmentation, itching, dryness, pain, peeling). She may experience fatigue. We also discussed possible risk of long term cosmetic changes or scar tissue. There is also a smaller risk for lung toxicity, ***cardiac toxicity, ***brachial plexopathy, ***lymphedema, ***musculoskeletal changes, ***rib fragility or ***induction of a second malignancy, ***late chronic non-healing soft tissue wound.    The patient asked  good questions which I answered to her satisfaction. She is enthusiastic about proceeding with treatment. A consent form has been *** signed and placed in her chart.  A total of *** medically necessary complex treatment devices will be fabricated and supervised by me: *** fields with MLCs for custom blocks to protect heart, and lungs;  and, a Vac-lok. MORE COMPLEX DEVICES MAY BE MADE IN DOSIMETRY FOR FIELD IN FIELD BEAMS FOR DOSE HOMOGENEITY.  I have requested : 3D Simulation which is medically necessary to give adequate dose to at risk tissues while sparing lungs and heart.  I have requested a DVH of the following structures: lungs, heart, *** lumpectomy cavity.    The patient will receive *** Gy in *** fractions to the *** with *** fields.  This will be *** followed by a boost.  On date of service, in total, I spent *** minutes on this encounter. Patient was seen in person.  _____________________________________   Eppie Gibson, MD  This document serves as a record of services personally performed by Eppie Gibson, MD. It was created on her behalf by Roney Mans, a trained medical scribe. The creation of this record  is based on the scribe's personal observations and the provider's statements to them. This document has been checked and approved by the attending provider.

## 2022-01-06 ENCOUNTER — Encounter: Payer: Self-pay | Admitting: Radiation Oncology

## 2022-01-06 ENCOUNTER — Other Ambulatory Visit: Payer: Self-pay

## 2022-01-06 ENCOUNTER — Ambulatory Visit
Admission: RE | Admit: 2022-01-06 | Discharge: 2022-01-06 | Disposition: A | Discharge status: Home / Self Care | Payer: Medicare HMO | Source: Ambulatory Visit | Attending: Radiation Oncology | Admitting: Radiation Oncology

## 2022-01-06 VITALS — BP 146/68 | HR 68 | Temp 97.8°F | Resp 18 | Wt 165.6 lb

## 2022-01-06 DIAGNOSIS — Z170 Estrogen receptor positive status [ER+]: Secondary | ICD-10-CM | POA: Diagnosis not present

## 2022-01-06 DIAGNOSIS — C50512 Malignant neoplasm of lower-outer quadrant of left female breast: Secondary | ICD-10-CM | POA: Diagnosis not present

## 2022-01-06 DIAGNOSIS — Z510 Encounter for antineoplastic radiation therapy: Secondary | ICD-10-CM | POA: Diagnosis not present

## 2022-01-06 NOTE — Addendum Note (Signed)
Encounter addended by: Elza Rafter, RN on: 01/06/2022 2:24 PM  Actions taken: Clinical Note Signed

## 2022-01-06 NOTE — Progress Notes (Signed)
Ct sim called and informed per Dr.Squire request that pt will need to be strapped down/ secured to table for treatments  due to seizure activity. Dr. Isidore Moos is concerned that pt will jump up off table if she feels like she is having a seizure. She will need this treatment with every treatment she has.

## 2022-01-07 ENCOUNTER — Encounter: Payer: Self-pay | Admitting: Radiation Oncology

## 2022-01-09 DIAGNOSIS — Z170 Estrogen receptor positive status [ER+]: Secondary | ICD-10-CM | POA: Insufficient documentation

## 2022-01-09 DIAGNOSIS — C50512 Malignant neoplasm of lower-outer quadrant of left female breast: Secondary | ICD-10-CM | POA: Insufficient documentation

## 2022-01-12 ENCOUNTER — Other Ambulatory Visit: Payer: Self-pay

## 2022-01-12 ENCOUNTER — Ambulatory Visit
Admission: RE | Admit: 2022-01-12 | Discharge: 2022-01-12 | Disposition: A | Discharge status: Home / Self Care | Payer: Medicare HMO | Source: Ambulatory Visit | Attending: Radiation Oncology | Admitting: Radiation Oncology

## 2022-01-12 DIAGNOSIS — Z170 Estrogen receptor positive status [ER+]: Secondary | ICD-10-CM | POA: Diagnosis not present

## 2022-01-12 DIAGNOSIS — Z510 Encounter for antineoplastic radiation therapy: Secondary | ICD-10-CM | POA: Diagnosis not present

## 2022-01-12 DIAGNOSIS — C50512 Malignant neoplasm of lower-outer quadrant of left female breast: Secondary | ICD-10-CM | POA: Diagnosis not present

## 2022-01-12 LAB — RAD ONC ARIA SESSION SUMMARY
Course Elapsed Days: 0
Plan Fractions Treated to Date: 1
Plan Fractions Treated to Date: 1
Plan Prescribed Dose Per Fraction: 2 Gy
Plan Prescribed Dose Per Fraction: 2 Gy
Plan Total Fractions Prescribed: 25
Plan Total Fractions Prescribed: 25
Plan Total Prescribed Dose: 50 Gy
Plan Total Prescribed Dose: 50 Gy
Reference Point Dosage Given to Date: 2 Gy
Reference Point Dosage Given to Date: 2 Gy
Reference Point Session Dosage Given: 2 Gy
Reference Point Session Dosage Given: 2 Gy
Session Number: 1

## 2022-01-12 MED ORDER — ALRA NON-METALLIC DEODORANT (RAD-ONC)
1.0000 | Freq: Once | TOPICAL | Status: AC
  Administered 2022-01-12: 1 via TOPICAL

## 2022-01-12 MED ORDER — RADIAPLEXRX EX GEL: Freq: Once | CUTANEOUS | Status: AC

## 2022-01-13 ENCOUNTER — Telehealth: Payer: Self-pay | Admitting: Hematology and Oncology

## 2022-01-13 ENCOUNTER — Other Ambulatory Visit: Payer: Self-pay

## 2022-01-13 ENCOUNTER — Ambulatory Visit
Admission: RE | Admit: 2022-01-13 | Discharge: 2022-01-13 | Disposition: A | Discharge status: Home / Self Care | Payer: Medicare HMO | Source: Ambulatory Visit | Attending: Radiation Oncology | Admitting: Radiation Oncology

## 2022-01-13 ENCOUNTER — Encounter: Payer: Self-pay | Admitting: *Deleted

## 2022-01-13 DIAGNOSIS — Z510 Encounter for antineoplastic radiation therapy: Secondary | ICD-10-CM | POA: Diagnosis not present

## 2022-01-13 DIAGNOSIS — Z170 Estrogen receptor positive status [ER+]: Secondary | ICD-10-CM | POA: Diagnosis not present

## 2022-01-13 DIAGNOSIS — C50512 Malignant neoplasm of lower-outer quadrant of left female breast: Secondary | ICD-10-CM | POA: Diagnosis not present

## 2022-01-13 LAB — RAD ONC ARIA SESSION SUMMARY
Course Elapsed Days: 1
Plan Fractions Treated to Date: 2
Plan Fractions Treated to Date: 2
Plan Prescribed Dose Per Fraction: 2 Gy
Plan Prescribed Dose Per Fraction: 2 Gy
Plan Total Fractions Prescribed: 25
Plan Total Fractions Prescribed: 25
Plan Total Prescribed Dose: 50 Gy
Plan Total Prescribed Dose: 50 Gy
Reference Point Dosage Given to Date: 4 Gy
Reference Point Dosage Given to Date: 4 Gy
Reference Point Session Dosage Given: 2 Gy
Reference Point Session Dosage Given: 2 Gy
Session Number: 2

## 2022-01-13 NOTE — Telephone Encounter (Signed)
Called patient per 11/6 in basket. Left voicemail of appointments scheduled.

## 2022-01-14 ENCOUNTER — Other Ambulatory Visit: Payer: Self-pay

## 2022-01-14 ENCOUNTER — Ambulatory Visit
Admission: RE | Admit: 2022-01-14 | Discharge: 2022-01-14 | Disposition: A | Discharge status: Home / Self Care | Payer: Medicare HMO | Source: Ambulatory Visit | Attending: Radiation Oncology | Admitting: Radiation Oncology

## 2022-01-14 DIAGNOSIS — Z170 Estrogen receptor positive status [ER+]: Secondary | ICD-10-CM | POA: Diagnosis not present

## 2022-01-14 DIAGNOSIS — C50512 Malignant neoplasm of lower-outer quadrant of left female breast: Secondary | ICD-10-CM | POA: Diagnosis not present

## 2022-01-14 DIAGNOSIS — Z510 Encounter for antineoplastic radiation therapy: Secondary | ICD-10-CM | POA: Diagnosis not present

## 2022-01-14 LAB — RAD ONC ARIA SESSION SUMMARY
Course Elapsed Days: 2
Plan Fractions Treated to Date: 3
Plan Fractions Treated to Date: 3
Plan Prescribed Dose Per Fraction: 2 Gy
Plan Prescribed Dose Per Fraction: 2 Gy
Plan Total Fractions Prescribed: 25
Plan Total Fractions Prescribed: 25
Plan Total Prescribed Dose: 50 Gy
Plan Total Prescribed Dose: 50 Gy
Reference Point Dosage Given to Date: 6 Gy
Reference Point Dosage Given to Date: 6 Gy
Reference Point Session Dosage Given: 2 Gy
Reference Point Session Dosage Given: 2 Gy
Session Number: 3

## 2022-01-15 ENCOUNTER — Other Ambulatory Visit: Payer: Self-pay

## 2022-01-15 ENCOUNTER — Ambulatory Visit
Admission: RE | Admit: 2022-01-15 | Discharge: 2022-01-15 | Disposition: A | Discharge status: Home / Self Care | Payer: Medicare HMO | Source: Ambulatory Visit | Attending: Radiation Oncology | Admitting: Radiation Oncology

## 2022-01-15 DIAGNOSIS — Z170 Estrogen receptor positive status [ER+]: Secondary | ICD-10-CM | POA: Diagnosis not present

## 2022-01-15 DIAGNOSIS — C50512 Malignant neoplasm of lower-outer quadrant of left female breast: Secondary | ICD-10-CM | POA: Diagnosis not present

## 2022-01-15 DIAGNOSIS — Z510 Encounter for antineoplastic radiation therapy: Secondary | ICD-10-CM | POA: Diagnosis not present

## 2022-01-15 LAB — RAD ONC ARIA SESSION SUMMARY
Course Elapsed Days: 3
Plan Fractions Treated to Date: 4
Plan Fractions Treated to Date: 4
Plan Prescribed Dose Per Fraction: 2 Gy
Plan Prescribed Dose Per Fraction: 2 Gy
Plan Total Fractions Prescribed: 25
Plan Total Fractions Prescribed: 25
Plan Total Prescribed Dose: 50 Gy
Plan Total Prescribed Dose: 50 Gy
Reference Point Dosage Given to Date: 8 Gy
Reference Point Dosage Given to Date: 8 Gy
Reference Point Session Dosage Given: 2 Gy
Reference Point Session Dosage Given: 2 Gy
Session Number: 4

## 2022-01-16 ENCOUNTER — Other Ambulatory Visit: Payer: Self-pay

## 2022-01-16 ENCOUNTER — Ambulatory Visit
Admission: RE | Admit: 2022-01-16 | Discharge: 2022-01-16 | Disposition: A | Discharge status: Home / Self Care | Payer: Medicare HMO | Source: Ambulatory Visit | Attending: Radiation Oncology | Admitting: Radiation Oncology

## 2022-01-16 DIAGNOSIS — C50512 Malignant neoplasm of lower-outer quadrant of left female breast: Secondary | ICD-10-CM | POA: Diagnosis not present

## 2022-01-16 DIAGNOSIS — Z510 Encounter for antineoplastic radiation therapy: Secondary | ICD-10-CM | POA: Diagnosis not present

## 2022-01-16 DIAGNOSIS — Z170 Estrogen receptor positive status [ER+]: Secondary | ICD-10-CM | POA: Diagnosis not present

## 2022-01-16 LAB — RAD ONC ARIA SESSION SUMMARY
Course Elapsed Days: 4
Plan Fractions Treated to Date: 5
Plan Fractions Treated to Date: 5
Plan Prescribed Dose Per Fraction: 2 Gy
Plan Prescribed Dose Per Fraction: 2 Gy
Plan Total Fractions Prescribed: 25
Plan Total Fractions Prescribed: 25
Plan Total Prescribed Dose: 50 Gy
Plan Total Prescribed Dose: 50 Gy
Reference Point Dosage Given to Date: 10 Gy
Reference Point Dosage Given to Date: 10 Gy
Reference Point Session Dosage Given: 2 Gy
Reference Point Session Dosage Given: 2 Gy
Session Number: 5

## 2022-01-19 ENCOUNTER — Other Ambulatory Visit: Payer: Self-pay

## 2022-01-19 ENCOUNTER — Ambulatory Visit
Admission: RE | Admit: 2022-01-19 | Discharge: 2022-01-19 | Disposition: A | Discharge status: Home / Self Care | Payer: Medicare HMO | Source: Ambulatory Visit | Attending: Radiation Oncology | Admitting: Radiation Oncology

## 2022-01-19 DIAGNOSIS — Z170 Estrogen receptor positive status [ER+]: Secondary | ICD-10-CM | POA: Diagnosis not present

## 2022-01-19 DIAGNOSIS — C50512 Malignant neoplasm of lower-outer quadrant of left female breast: Secondary | ICD-10-CM

## 2022-01-19 DIAGNOSIS — Z510 Encounter for antineoplastic radiation therapy: Secondary | ICD-10-CM | POA: Diagnosis not present

## 2022-01-19 LAB — RAD ONC ARIA SESSION SUMMARY
Course Elapsed Days: 7
Plan Fractions Treated to Date: 6
Plan Fractions Treated to Date: 6
Plan Prescribed Dose Per Fraction: 2 Gy
Plan Prescribed Dose Per Fraction: 2 Gy
Plan Total Fractions Prescribed: 25
Plan Total Fractions Prescribed: 25
Plan Total Prescribed Dose: 50 Gy
Plan Total Prescribed Dose: 50 Gy
Reference Point Dosage Given to Date: 12 Gy
Reference Point Dosage Given to Date: 12 Gy
Reference Point Session Dosage Given: 2 Gy
Reference Point Session Dosage Given: 2 Gy
Session Number: 6

## 2022-01-19 MED ORDER — RADIAPLEXRX EX GEL: Freq: Once | CUTANEOUS | Status: AC

## 2022-01-20 ENCOUNTER — Ambulatory Visit
Admission: RE | Admit: 2022-01-20 | Discharge: 2022-01-20 | Disposition: A | Discharge status: Home / Self Care | Payer: Medicare HMO | Source: Ambulatory Visit | Attending: Radiation Oncology | Admitting: Radiation Oncology

## 2022-01-20 ENCOUNTER — Other Ambulatory Visit: Payer: Self-pay

## 2022-01-20 DIAGNOSIS — Z170 Estrogen receptor positive status [ER+]: Secondary | ICD-10-CM | POA: Diagnosis not present

## 2022-01-20 DIAGNOSIS — Z510 Encounter for antineoplastic radiation therapy: Secondary | ICD-10-CM | POA: Diagnosis not present

## 2022-01-20 DIAGNOSIS — C50512 Malignant neoplasm of lower-outer quadrant of left female breast: Secondary | ICD-10-CM | POA: Diagnosis not present

## 2022-01-20 LAB — RAD ONC ARIA SESSION SUMMARY
Course Elapsed Days: 8
Plan Fractions Treated to Date: 7
Plan Fractions Treated to Date: 7
Plan Prescribed Dose Per Fraction: 2 Gy
Plan Prescribed Dose Per Fraction: 2 Gy
Plan Total Fractions Prescribed: 25
Plan Total Fractions Prescribed: 25
Plan Total Prescribed Dose: 50 Gy
Plan Total Prescribed Dose: 50 Gy
Reference Point Dosage Given to Date: 14 Gy
Reference Point Dosage Given to Date: 14 Gy
Reference Point Session Dosage Given: 2 Gy
Reference Point Session Dosage Given: 2 Gy
Session Number: 7

## 2022-01-21 ENCOUNTER — Other Ambulatory Visit: Payer: Self-pay

## 2022-01-21 ENCOUNTER — Ambulatory Visit
Admission: RE | Admit: 2022-01-21 | Discharge: 2022-01-21 | Disposition: A | Discharge status: Home / Self Care | Payer: Medicare HMO | Source: Ambulatory Visit | Attending: Radiation Oncology | Admitting: Radiation Oncology

## 2022-01-21 DIAGNOSIS — Z510 Encounter for antineoplastic radiation therapy: Secondary | ICD-10-CM | POA: Diagnosis not present

## 2022-01-21 DIAGNOSIS — Z170 Estrogen receptor positive status [ER+]: Secondary | ICD-10-CM | POA: Diagnosis not present

## 2022-01-21 DIAGNOSIS — C50512 Malignant neoplasm of lower-outer quadrant of left female breast: Secondary | ICD-10-CM | POA: Diagnosis not present

## 2022-01-21 LAB — RAD ONC ARIA SESSION SUMMARY
Course Elapsed Days: 9
Plan Fractions Treated to Date: 8
Plan Fractions Treated to Date: 8
Plan Prescribed Dose Per Fraction: 2 Gy
Plan Prescribed Dose Per Fraction: 2 Gy
Plan Total Fractions Prescribed: 25
Plan Total Fractions Prescribed: 25
Plan Total Prescribed Dose: 50 Gy
Plan Total Prescribed Dose: 50 Gy
Reference Point Dosage Given to Date: 16 Gy
Reference Point Dosage Given to Date: 16 Gy
Reference Point Session Dosage Given: 2 Gy
Reference Point Session Dosage Given: 2 Gy
Session Number: 8

## 2022-01-22 ENCOUNTER — Other Ambulatory Visit: Payer: Self-pay

## 2022-01-22 ENCOUNTER — Ambulatory Visit
Admission: RE | Admit: 2022-01-22 | Discharge: 2022-01-22 | Disposition: A | Discharge status: Home / Self Care | Payer: Medicare HMO | Source: Ambulatory Visit | Attending: Radiation Oncology | Admitting: Radiation Oncology

## 2022-01-22 DIAGNOSIS — C50512 Malignant neoplasm of lower-outer quadrant of left female breast: Secondary | ICD-10-CM | POA: Diagnosis not present

## 2022-01-22 DIAGNOSIS — Z510 Encounter for antineoplastic radiation therapy: Secondary | ICD-10-CM | POA: Diagnosis not present

## 2022-01-22 DIAGNOSIS — Z170 Estrogen receptor positive status [ER+]: Secondary | ICD-10-CM | POA: Diagnosis not present

## 2022-01-22 LAB — RAD ONC ARIA SESSION SUMMARY
Course Elapsed Days: 10
Plan Fractions Treated to Date: 9
Plan Fractions Treated to Date: 9
Plan Prescribed Dose Per Fraction: 2 Gy
Plan Prescribed Dose Per Fraction: 2 Gy
Plan Total Fractions Prescribed: 25
Plan Total Fractions Prescribed: 25
Plan Total Prescribed Dose: 50 Gy
Plan Total Prescribed Dose: 50 Gy
Reference Point Dosage Given to Date: 18 Gy
Reference Point Dosage Given to Date: 18 Gy
Reference Point Session Dosage Given: 2 Gy
Reference Point Session Dosage Given: 2 Gy
Session Number: 9

## 2022-01-23 ENCOUNTER — Other Ambulatory Visit: Payer: Self-pay

## 2022-01-23 ENCOUNTER — Ambulatory Visit
Admission: RE | Admit: 2022-01-23 | Discharge: 2022-01-23 | Disposition: A | Discharge status: Home / Self Care | Payer: Medicare HMO | Source: Ambulatory Visit | Attending: Radiation Oncology | Admitting: Radiation Oncology

## 2022-01-23 ENCOUNTER — Telehealth: Payer: Self-pay

## 2022-01-23 DIAGNOSIS — C50512 Malignant neoplasm of lower-outer quadrant of left female breast: Secondary | ICD-10-CM | POA: Diagnosis not present

## 2022-01-23 DIAGNOSIS — Z170 Estrogen receptor positive status [ER+]: Secondary | ICD-10-CM | POA: Diagnosis not present

## 2022-01-23 DIAGNOSIS — Z510 Encounter for antineoplastic radiation therapy: Secondary | ICD-10-CM | POA: Diagnosis not present

## 2022-01-23 LAB — RAD ONC ARIA SESSION SUMMARY
Course Elapsed Days: 11
Plan Fractions Treated to Date: 10
Plan Fractions Treated to Date: 10
Plan Prescribed Dose Per Fraction: 2 Gy
Plan Prescribed Dose Per Fraction: 2 Gy
Plan Total Fractions Prescribed: 25
Plan Total Fractions Prescribed: 25
Plan Total Prescribed Dose: 50 Gy
Plan Total Prescribed Dose: 50 Gy
Reference Point Dosage Given to Date: 20 Gy
Reference Point Dosage Given to Date: 20 Gy
Reference Point Session Dosage Given: 2 Gy
Reference Point Session Dosage Given: 2 Gy
Session Number: 10

## 2022-01-23 NOTE — Telephone Encounter (Signed)
Rn called pt back concerning questions about a rehab survey that she received in the mail recently. Rn encouraged pt to bring the survey to her appointment on Monday.

## 2022-01-25 ENCOUNTER — Other Ambulatory Visit: Payer: Self-pay

## 2022-01-25 ENCOUNTER — Ambulatory Visit: Payer: Medicare HMO

## 2022-01-25 ENCOUNTER — Ambulatory Visit
Admission: RE | Admit: 2022-01-25 | Discharge: 2022-01-25 | Disposition: A | Discharge status: Home / Self Care | Payer: Medicare HMO | Source: Ambulatory Visit | Attending: Radiation Oncology | Admitting: Radiation Oncology

## 2022-01-25 DIAGNOSIS — Z510 Encounter for antineoplastic radiation therapy: Secondary | ICD-10-CM | POA: Diagnosis not present

## 2022-01-25 DIAGNOSIS — Z170 Estrogen receptor positive status [ER+]: Secondary | ICD-10-CM | POA: Diagnosis not present

## 2022-01-25 DIAGNOSIS — C50512 Malignant neoplasm of lower-outer quadrant of left female breast: Secondary | ICD-10-CM | POA: Diagnosis not present

## 2022-01-25 LAB — RAD ONC ARIA SESSION SUMMARY
Course Elapsed Days: 13
Plan Fractions Treated to Date: 11
Plan Fractions Treated to Date: 11
Plan Prescribed Dose Per Fraction: 2 Gy
Plan Prescribed Dose Per Fraction: 2 Gy
Plan Total Fractions Prescribed: 25
Plan Total Fractions Prescribed: 25
Plan Total Prescribed Dose: 50 Gy
Plan Total Prescribed Dose: 50 Gy
Reference Point Dosage Given to Date: 22 Gy
Reference Point Dosage Given to Date: 22 Gy
Reference Point Session Dosage Given: 2 Gy
Reference Point Session Dosage Given: 2 Gy
Session Number: 11

## 2022-01-26 ENCOUNTER — Ambulatory Visit
Admission: RE | Admit: 2022-01-26 | Discharge: 2022-01-26 | Disposition: A | Discharge status: Home / Self Care | Payer: Medicare HMO | Source: Ambulatory Visit | Attending: Radiation Oncology | Admitting: Radiation Oncology

## 2022-01-26 ENCOUNTER — Ambulatory Visit: Payer: Medicare HMO

## 2022-01-26 ENCOUNTER — Other Ambulatory Visit: Payer: Self-pay

## 2022-01-26 DIAGNOSIS — Z170 Estrogen receptor positive status [ER+]: Secondary | ICD-10-CM | POA: Diagnosis not present

## 2022-01-26 DIAGNOSIS — Z510 Encounter for antineoplastic radiation therapy: Secondary | ICD-10-CM | POA: Diagnosis not present

## 2022-01-26 DIAGNOSIS — C50512 Malignant neoplasm of lower-outer quadrant of left female breast: Secondary | ICD-10-CM | POA: Diagnosis not present

## 2022-01-26 LAB — RAD ONC ARIA SESSION SUMMARY
Course Elapsed Days: 14
Plan Fractions Treated to Date: 12
Plan Fractions Treated to Date: 12
Plan Prescribed Dose Per Fraction: 2 Gy
Plan Prescribed Dose Per Fraction: 2 Gy
Plan Total Fractions Prescribed: 25
Plan Total Fractions Prescribed: 25
Plan Total Prescribed Dose: 50 Gy
Plan Total Prescribed Dose: 50 Gy
Reference Point Dosage Given to Date: 24 Gy
Reference Point Dosage Given to Date: 24 Gy
Reference Point Session Dosage Given: 2 Gy
Reference Point Session Dosage Given: 2 Gy
Session Number: 12

## 2022-01-27 ENCOUNTER — Other Ambulatory Visit: Payer: Self-pay

## 2022-01-27 ENCOUNTER — Ambulatory Visit
Admission: RE | Admit: 2022-01-27 | Discharge: 2022-01-27 | Disposition: A | Discharge status: Home / Self Care | Payer: Medicare HMO | Source: Ambulatory Visit | Attending: Radiation Oncology | Admitting: Radiation Oncology

## 2022-01-27 DIAGNOSIS — C50512 Malignant neoplasm of lower-outer quadrant of left female breast: Secondary | ICD-10-CM | POA: Diagnosis not present

## 2022-01-27 DIAGNOSIS — Z510 Encounter for antineoplastic radiation therapy: Secondary | ICD-10-CM | POA: Diagnosis not present

## 2022-01-27 DIAGNOSIS — Z170 Estrogen receptor positive status [ER+]: Secondary | ICD-10-CM | POA: Diagnosis not present

## 2022-01-27 LAB — RAD ONC ARIA SESSION SUMMARY
Course Elapsed Days: 15
Plan Fractions Treated to Date: 13
Plan Fractions Treated to Date: 13
Plan Prescribed Dose Per Fraction: 2 Gy
Plan Prescribed Dose Per Fraction: 2 Gy
Plan Total Fractions Prescribed: 25
Plan Total Fractions Prescribed: 25
Plan Total Prescribed Dose: 50 Gy
Plan Total Prescribed Dose: 50 Gy
Reference Point Dosage Given to Date: 26 Gy
Reference Point Dosage Given to Date: 26 Gy
Reference Point Session Dosage Given: 2 Gy
Reference Point Session Dosage Given: 2 Gy
Session Number: 13

## 2022-01-28 ENCOUNTER — Other Ambulatory Visit: Payer: Self-pay

## 2022-01-28 ENCOUNTER — Ambulatory Visit
Admission: RE | Admit: 2022-01-28 | Discharge: 2022-01-28 | Disposition: A | Discharge status: Home / Self Care | Payer: Medicare HMO | Source: Ambulatory Visit | Attending: Radiation Oncology | Admitting: Radiation Oncology

## 2022-01-28 DIAGNOSIS — C50512 Malignant neoplasm of lower-outer quadrant of left female breast: Secondary | ICD-10-CM | POA: Diagnosis not present

## 2022-01-28 DIAGNOSIS — Z170 Estrogen receptor positive status [ER+]: Secondary | ICD-10-CM | POA: Diagnosis not present

## 2022-01-28 DIAGNOSIS — Z510 Encounter for antineoplastic radiation therapy: Secondary | ICD-10-CM | POA: Diagnosis not present

## 2022-01-28 LAB — RAD ONC ARIA SESSION SUMMARY
Course Elapsed Days: 16
Plan Fractions Treated to Date: 14
Plan Fractions Treated to Date: 14
Plan Prescribed Dose Per Fraction: 2 Gy
Plan Prescribed Dose Per Fraction: 2 Gy
Plan Total Fractions Prescribed: 25
Plan Total Fractions Prescribed: 25
Plan Total Prescribed Dose: 50 Gy
Plan Total Prescribed Dose: 50 Gy
Reference Point Dosage Given to Date: 28 Gy
Reference Point Dosage Given to Date: 28 Gy
Reference Point Session Dosage Given: 2 Gy
Reference Point Session Dosage Given: 2 Gy
Session Number: 14

## 2022-02-02 ENCOUNTER — Ambulatory Visit
Admission: RE | Admit: 2022-02-02 | Discharge: 2022-02-02 | Disposition: A | Discharge status: Home / Self Care | Payer: Medicare HMO | Source: Ambulatory Visit | Attending: Radiation Oncology | Admitting: Radiation Oncology

## 2022-02-02 ENCOUNTER — Other Ambulatory Visit: Payer: Self-pay

## 2022-02-02 DIAGNOSIS — C50512 Malignant neoplasm of lower-outer quadrant of left female breast: Secondary | ICD-10-CM | POA: Diagnosis not present

## 2022-02-02 DIAGNOSIS — Z510 Encounter for antineoplastic radiation therapy: Secondary | ICD-10-CM | POA: Diagnosis not present

## 2022-02-02 DIAGNOSIS — Z170 Estrogen receptor positive status [ER+]: Secondary | ICD-10-CM | POA: Diagnosis not present

## 2022-02-02 LAB — RAD ONC ARIA SESSION SUMMARY
Course Elapsed Days: 21
Plan Fractions Treated to Date: 15
Plan Fractions Treated to Date: 15
Plan Prescribed Dose Per Fraction: 2 Gy
Plan Prescribed Dose Per Fraction: 2 Gy
Plan Total Fractions Prescribed: 25
Plan Total Fractions Prescribed: 25
Plan Total Prescribed Dose: 50 Gy
Plan Total Prescribed Dose: 50 Gy
Reference Point Dosage Given to Date: 30 Gy
Reference Point Dosage Given to Date: 30 Gy
Reference Point Session Dosage Given: 2 Gy
Reference Point Session Dosage Given: 2 Gy
Session Number: 15

## 2022-02-03 ENCOUNTER — Ambulatory Visit
Admission: RE | Admit: 2022-02-03 | Discharge: 2022-02-03 | Disposition: A | Discharge status: Home / Self Care | Payer: Medicare HMO | Source: Ambulatory Visit | Attending: Radiation Oncology | Admitting: Radiation Oncology

## 2022-02-03 ENCOUNTER — Other Ambulatory Visit: Payer: Self-pay

## 2022-02-03 DIAGNOSIS — Z510 Encounter for antineoplastic radiation therapy: Secondary | ICD-10-CM | POA: Diagnosis not present

## 2022-02-03 DIAGNOSIS — Z170 Estrogen receptor positive status [ER+]: Secondary | ICD-10-CM | POA: Diagnosis not present

## 2022-02-03 DIAGNOSIS — C50512 Malignant neoplasm of lower-outer quadrant of left female breast: Secondary | ICD-10-CM | POA: Diagnosis not present

## 2022-02-03 LAB — RAD ONC ARIA SESSION SUMMARY
Course Elapsed Days: 22
Plan Fractions Treated to Date: 16
Plan Fractions Treated to Date: 16
Plan Prescribed Dose Per Fraction: 2 Gy
Plan Prescribed Dose Per Fraction: 2 Gy
Plan Total Fractions Prescribed: 25
Plan Total Fractions Prescribed: 25
Plan Total Prescribed Dose: 50 Gy
Plan Total Prescribed Dose: 50 Gy
Reference Point Dosage Given to Date: 32 Gy
Reference Point Dosage Given to Date: 32 Gy
Reference Point Session Dosage Given: 2 Gy
Reference Point Session Dosage Given: 2 Gy
Session Number: 16

## 2022-02-04 ENCOUNTER — Other Ambulatory Visit: Payer: Self-pay

## 2022-02-04 ENCOUNTER — Ambulatory Visit
Admission: RE | Admit: 2022-02-04 | Discharge: 2022-02-04 | Disposition: A | Discharge status: Home / Self Care | Payer: Medicare HMO | Source: Ambulatory Visit | Attending: Radiation Oncology | Admitting: Radiation Oncology

## 2022-02-04 DIAGNOSIS — Z510 Encounter for antineoplastic radiation therapy: Secondary | ICD-10-CM | POA: Diagnosis not present

## 2022-02-04 DIAGNOSIS — Z170 Estrogen receptor positive status [ER+]: Secondary | ICD-10-CM | POA: Diagnosis not present

## 2022-02-04 DIAGNOSIS — C50512 Malignant neoplasm of lower-outer quadrant of left female breast: Secondary | ICD-10-CM | POA: Diagnosis not present

## 2022-02-04 LAB — RAD ONC ARIA SESSION SUMMARY
Course Elapsed Days: 23
Plan Fractions Treated to Date: 17
Plan Fractions Treated to Date: 17
Plan Prescribed Dose Per Fraction: 2 Gy
Plan Prescribed Dose Per Fraction: 2 Gy
Plan Total Fractions Prescribed: 25
Plan Total Fractions Prescribed: 25
Plan Total Prescribed Dose: 50 Gy
Plan Total Prescribed Dose: 50 Gy
Reference Point Dosage Given to Date: 34 Gy
Reference Point Dosage Given to Date: 34 Gy
Reference Point Session Dosage Given: 2 Gy
Reference Point Session Dosage Given: 2 Gy
Session Number: 17

## 2022-02-05 ENCOUNTER — Other Ambulatory Visit: Payer: Self-pay

## 2022-02-05 ENCOUNTER — Ambulatory Visit
Admission: RE | Admit: 2022-02-05 | Discharge: 2022-02-05 | Disposition: A | Discharge status: Home / Self Care | Payer: Medicare HMO | Source: Ambulatory Visit | Attending: Radiation Oncology | Admitting: Radiation Oncology

## 2022-02-05 DIAGNOSIS — Z170 Estrogen receptor positive status [ER+]: Secondary | ICD-10-CM | POA: Diagnosis not present

## 2022-02-05 DIAGNOSIS — Z510 Encounter for antineoplastic radiation therapy: Secondary | ICD-10-CM | POA: Diagnosis not present

## 2022-02-05 DIAGNOSIS — C50512 Malignant neoplasm of lower-outer quadrant of left female breast: Secondary | ICD-10-CM | POA: Diagnosis not present

## 2022-02-05 LAB — RAD ONC ARIA SESSION SUMMARY
Course Elapsed Days: 24
Plan Fractions Treated to Date: 18
Plan Fractions Treated to Date: 18
Plan Prescribed Dose Per Fraction: 2 Gy
Plan Prescribed Dose Per Fraction: 2 Gy
Plan Total Fractions Prescribed: 25
Plan Total Fractions Prescribed: 25
Plan Total Prescribed Dose: 50 Gy
Plan Total Prescribed Dose: 50 Gy
Reference Point Dosage Given to Date: 36 Gy
Reference Point Dosage Given to Date: 36 Gy
Reference Point Session Dosage Given: 2 Gy
Reference Point Session Dosage Given: 2 Gy
Session Number: 18

## 2022-02-06 ENCOUNTER — Ambulatory Visit
Admission: RE | Admit: 2022-02-06 | Discharge: 2022-02-06 | Disposition: A | Discharge status: Home / Self Care | Payer: Medicare HMO | Source: Ambulatory Visit | Attending: Radiation Oncology | Admitting: Radiation Oncology

## 2022-02-06 ENCOUNTER — Other Ambulatory Visit: Payer: Self-pay

## 2022-02-06 DIAGNOSIS — Z170 Estrogen receptor positive status [ER+]: Secondary | ICD-10-CM | POA: Insufficient documentation

## 2022-02-06 DIAGNOSIS — C50512 Malignant neoplasm of lower-outer quadrant of left female breast: Secondary | ICD-10-CM | POA: Diagnosis present

## 2022-02-06 DIAGNOSIS — Z510 Encounter for antineoplastic radiation therapy: Secondary | ICD-10-CM | POA: Diagnosis not present

## 2022-02-06 LAB — RAD ONC ARIA SESSION SUMMARY
Course Elapsed Days: 25
Plan Fractions Treated to Date: 19
Plan Fractions Treated to Date: 19
Plan Prescribed Dose Per Fraction: 2 Gy
Plan Prescribed Dose Per Fraction: 2 Gy
Plan Total Fractions Prescribed: 25
Plan Total Fractions Prescribed: 25
Plan Total Prescribed Dose: 50 Gy
Plan Total Prescribed Dose: 50 Gy
Reference Point Dosage Given to Date: 38 Gy
Reference Point Dosage Given to Date: 38 Gy
Reference Point Session Dosage Given: 2 Gy
Reference Point Session Dosage Given: 2 Gy
Session Number: 19

## 2022-02-09 ENCOUNTER — Ambulatory Visit
Admission: RE | Admit: 2022-02-09 | Discharge: 2022-02-09 | Disposition: A | Discharge status: Home / Self Care | Payer: Medicare HMO | Source: Ambulatory Visit | Attending: Radiation Oncology | Admitting: Radiation Oncology

## 2022-02-09 ENCOUNTER — Other Ambulatory Visit: Payer: Self-pay

## 2022-02-09 DIAGNOSIS — Z170 Estrogen receptor positive status [ER+]: Secondary | ICD-10-CM | POA: Diagnosis not present

## 2022-02-09 DIAGNOSIS — Z510 Encounter for antineoplastic radiation therapy: Secondary | ICD-10-CM | POA: Diagnosis not present

## 2022-02-09 DIAGNOSIS — C50512 Malignant neoplasm of lower-outer quadrant of left female breast: Secondary | ICD-10-CM | POA: Diagnosis not present

## 2022-02-09 LAB — RAD ONC ARIA SESSION SUMMARY
Course Elapsed Days: 28
Plan Fractions Treated to Date: 20
Plan Fractions Treated to Date: 20
Plan Prescribed Dose Per Fraction: 2 Gy
Plan Prescribed Dose Per Fraction: 2 Gy
Plan Total Fractions Prescribed: 25
Plan Total Fractions Prescribed: 25
Plan Total Prescribed Dose: 50 Gy
Plan Total Prescribed Dose: 50 Gy
Reference Point Dosage Given to Date: 40 Gy
Reference Point Dosage Given to Date: 40 Gy
Reference Point Session Dosage Given: 2 Gy
Reference Point Session Dosage Given: 2 Gy
Session Number: 20

## 2022-02-09 MED ORDER — RADIAPLEXRX EX GEL: Freq: Once | CUTANEOUS | Status: AC

## 2022-02-10 ENCOUNTER — Other Ambulatory Visit: Payer: Self-pay

## 2022-02-10 ENCOUNTER — Ambulatory Visit
Admission: RE | Admit: 2022-02-10 | Discharge: 2022-02-10 | Disposition: A | Discharge status: Home / Self Care | Payer: Medicare HMO | Source: Ambulatory Visit | Attending: Radiation Oncology | Admitting: Radiation Oncology

## 2022-02-10 DIAGNOSIS — C50512 Malignant neoplasm of lower-outer quadrant of left female breast: Secondary | ICD-10-CM | POA: Diagnosis not present

## 2022-02-10 DIAGNOSIS — Z510 Encounter for antineoplastic radiation therapy: Secondary | ICD-10-CM | POA: Diagnosis not present

## 2022-02-10 DIAGNOSIS — Z170 Estrogen receptor positive status [ER+]: Secondary | ICD-10-CM | POA: Diagnosis not present

## 2022-02-10 LAB — RAD ONC ARIA SESSION SUMMARY
Course Elapsed Days: 29
Plan Fractions Treated to Date: 21
Plan Fractions Treated to Date: 21
Plan Prescribed Dose Per Fraction: 2 Gy
Plan Prescribed Dose Per Fraction: 2 Gy
Plan Total Fractions Prescribed: 25
Plan Total Fractions Prescribed: 25
Plan Total Prescribed Dose: 50 Gy
Plan Total Prescribed Dose: 50 Gy
Reference Point Dosage Given to Date: 42 Gy
Reference Point Dosage Given to Date: 42 Gy
Reference Point Session Dosage Given: 2 Gy
Reference Point Session Dosage Given: 2 Gy
Session Number: 21

## 2022-02-11 ENCOUNTER — Other Ambulatory Visit: Payer: Self-pay

## 2022-02-11 ENCOUNTER — Ambulatory Visit
Admission: RE | Admit: 2022-02-11 | Discharge: 2022-02-11 | Disposition: A | Discharge status: Home / Self Care | Payer: Medicare HMO | Source: Ambulatory Visit | Attending: Radiation Oncology | Admitting: Radiation Oncology

## 2022-02-11 DIAGNOSIS — Z510 Encounter for antineoplastic radiation therapy: Secondary | ICD-10-CM | POA: Diagnosis not present

## 2022-02-11 DIAGNOSIS — Z170 Estrogen receptor positive status [ER+]: Secondary | ICD-10-CM | POA: Diagnosis not present

## 2022-02-11 DIAGNOSIS — C50512 Malignant neoplasm of lower-outer quadrant of left female breast: Secondary | ICD-10-CM | POA: Diagnosis not present

## 2022-02-11 LAB — RAD ONC ARIA SESSION SUMMARY
Course Elapsed Days: 30
Plan Fractions Treated to Date: 22
Plan Fractions Treated to Date: 22
Plan Prescribed Dose Per Fraction: 2 Gy
Plan Prescribed Dose Per Fraction: 2 Gy
Plan Total Fractions Prescribed: 25
Plan Total Fractions Prescribed: 25
Plan Total Prescribed Dose: 50 Gy
Plan Total Prescribed Dose: 50 Gy
Reference Point Dosage Given to Date: 44 Gy
Reference Point Dosage Given to Date: 44 Gy
Reference Point Session Dosage Given: 2 Gy
Reference Point Session Dosage Given: 2 Gy
Session Number: 22

## 2022-02-12 ENCOUNTER — Ambulatory Visit
Admission: RE | Admit: 2022-02-12 | Discharge: 2022-02-12 | Disposition: A | Discharge status: Home / Self Care | Payer: Medicare HMO | Source: Ambulatory Visit | Attending: Radiation Oncology | Admitting: Radiation Oncology

## 2022-02-12 ENCOUNTER — Other Ambulatory Visit: Payer: Self-pay

## 2022-02-12 DIAGNOSIS — C50512 Malignant neoplasm of lower-outer quadrant of left female breast: Secondary | ICD-10-CM | POA: Diagnosis not present

## 2022-02-12 DIAGNOSIS — Z170 Estrogen receptor positive status [ER+]: Secondary | ICD-10-CM | POA: Diagnosis not present

## 2022-02-12 DIAGNOSIS — Z510 Encounter for antineoplastic radiation therapy: Secondary | ICD-10-CM | POA: Diagnosis not present

## 2022-02-12 LAB — RAD ONC ARIA SESSION SUMMARY
Course Elapsed Days: 31
Plan Fractions Treated to Date: 23
Plan Fractions Treated to Date: 23
Plan Prescribed Dose Per Fraction: 2 Gy
Plan Prescribed Dose Per Fraction: 2 Gy
Plan Total Fractions Prescribed: 25
Plan Total Fractions Prescribed: 25
Plan Total Prescribed Dose: 50 Gy
Plan Total Prescribed Dose: 50 Gy
Reference Point Dosage Given to Date: 46 Gy
Reference Point Dosage Given to Date: 46 Gy
Reference Point Session Dosage Given: 2 Gy
Reference Point Session Dosage Given: 2 Gy
Session Number: 23

## 2022-02-13 ENCOUNTER — Ambulatory Visit
Admission: RE | Admit: 2022-02-13 | Discharge: 2022-02-13 | Disposition: A | Discharge status: Home / Self Care | Payer: Medicare HMO | Source: Ambulatory Visit | Attending: Radiation Oncology | Admitting: Radiation Oncology

## 2022-02-13 ENCOUNTER — Other Ambulatory Visit: Payer: Self-pay

## 2022-02-13 DIAGNOSIS — C50512 Malignant neoplasm of lower-outer quadrant of left female breast: Secondary | ICD-10-CM | POA: Diagnosis not present

## 2022-02-13 DIAGNOSIS — Z170 Estrogen receptor positive status [ER+]: Secondary | ICD-10-CM | POA: Diagnosis not present

## 2022-02-13 DIAGNOSIS — Z510 Encounter for antineoplastic radiation therapy: Secondary | ICD-10-CM | POA: Diagnosis not present

## 2022-02-13 LAB — RAD ONC ARIA SESSION SUMMARY
Course Elapsed Days: 32
Plan Fractions Treated to Date: 24
Plan Fractions Treated to Date: 24
Plan Prescribed Dose Per Fraction: 2 Gy
Plan Prescribed Dose Per Fraction: 2 Gy
Plan Total Fractions Prescribed: 25
Plan Total Fractions Prescribed: 25
Plan Total Prescribed Dose: 50 Gy
Plan Total Prescribed Dose: 50 Gy
Reference Point Dosage Given to Date: 48 Gy
Reference Point Dosage Given to Date: 48 Gy
Reference Point Session Dosage Given: 2 Gy
Reference Point Session Dosage Given: 2 Gy
Session Number: 24

## 2022-02-16 ENCOUNTER — Ambulatory Visit
Admission: RE | Admit: 2022-02-16 | Discharge: 2022-02-16 | Disposition: A | Discharge status: Home / Self Care | Payer: Medicare HMO | Source: Ambulatory Visit | Attending: Radiation Oncology | Admitting: Radiation Oncology

## 2022-02-16 ENCOUNTER — Other Ambulatory Visit: Payer: Self-pay

## 2022-02-16 ENCOUNTER — Ambulatory Visit: Payer: Medicare HMO | Admitting: Radiation Oncology

## 2022-02-16 ENCOUNTER — Other Ambulatory Visit: Payer: Self-pay | Admitting: Radiation Oncology

## 2022-02-16 DIAGNOSIS — Z510 Encounter for antineoplastic radiation therapy: Secondary | ICD-10-CM | POA: Diagnosis not present

## 2022-02-16 DIAGNOSIS — C50512 Malignant neoplasm of lower-outer quadrant of left female breast: Secondary | ICD-10-CM | POA: Diagnosis not present

## 2022-02-16 DIAGNOSIS — Z170 Estrogen receptor positive status [ER+]: Secondary | ICD-10-CM

## 2022-02-16 LAB — RAD ONC ARIA SESSION SUMMARY
Course Elapsed Days: 35
Plan Fractions Treated to Date: 25
Plan Fractions Treated to Date: 25
Plan Prescribed Dose Per Fraction: 2 Gy
Plan Prescribed Dose Per Fraction: 2 Gy
Plan Total Fractions Prescribed: 25
Plan Total Fractions Prescribed: 25
Plan Total Prescribed Dose: 50 Gy
Plan Total Prescribed Dose: 50 Gy
Reference Point Dosage Given to Date: 50 Gy
Reference Point Dosage Given to Date: 50 Gy
Reference Point Session Dosage Given: 2 Gy
Reference Point Session Dosage Given: 2 Gy
Session Number: 25

## 2022-02-16 MED ORDER — LIDOCAINE 4 % EX GEL: CUTANEOUS | 1 refills | Status: DC

## 2022-02-17 ENCOUNTER — Ambulatory Visit
Admission: RE | Admit: 2022-02-17 | Discharge: 2022-02-17 | Disposition: A | Discharge status: Home / Self Care | Payer: Medicare HMO | Source: Ambulatory Visit | Attending: Radiation Oncology | Admitting: Radiation Oncology

## 2022-02-17 ENCOUNTER — Other Ambulatory Visit: Payer: Self-pay

## 2022-02-17 ENCOUNTER — Ambulatory Visit: Payer: Medicare HMO

## 2022-02-17 DIAGNOSIS — Z510 Encounter for antineoplastic radiation therapy: Secondary | ICD-10-CM | POA: Diagnosis not present

## 2022-02-17 DIAGNOSIS — C50512 Malignant neoplasm of lower-outer quadrant of left female breast: Secondary | ICD-10-CM | POA: Diagnosis not present

## 2022-02-17 DIAGNOSIS — Z170 Estrogen receptor positive status [ER+]: Secondary | ICD-10-CM | POA: Diagnosis not present

## 2022-02-17 LAB — RAD ONC ARIA SESSION SUMMARY
Course Elapsed Days: 36
Plan Fractions Treated to Date: 1
Plan Prescribed Dose Per Fraction: 2 Gy
Plan Total Fractions Prescribed: 5
Plan Total Prescribed Dose: 10 Gy
Reference Point Dosage Given to Date: 2 Gy
Reference Point Session Dosage Given: 2 Gy
Session Number: 26

## 2022-02-18 ENCOUNTER — Other Ambulatory Visit: Payer: Self-pay

## 2022-02-18 ENCOUNTER — Ambulatory Visit: Payer: Medicare HMO

## 2022-02-18 ENCOUNTER — Ambulatory Visit
Admission: RE | Admit: 2022-02-18 | Discharge: 2022-02-18 | Disposition: A | Discharge status: Home / Self Care | Payer: Medicare HMO | Source: Ambulatory Visit | Attending: Radiation Oncology | Admitting: Radiation Oncology

## 2022-02-18 DIAGNOSIS — Z170 Estrogen receptor positive status [ER+]: Secondary | ICD-10-CM | POA: Diagnosis not present

## 2022-02-18 DIAGNOSIS — C50512 Malignant neoplasm of lower-outer quadrant of left female breast: Secondary | ICD-10-CM | POA: Diagnosis not present

## 2022-02-18 LAB — RAD ONC ARIA SESSION SUMMARY
Course Elapsed Days: 37
Plan Fractions Treated to Date: 2
Plan Prescribed Dose Per Fraction: 2 Gy
Plan Total Fractions Prescribed: 5
Plan Total Prescribed Dose: 10 Gy
Reference Point Dosage Given to Date: 4 Gy
Reference Point Session Dosage Given: 2 Gy
Session Number: 27

## 2022-02-19 ENCOUNTER — Other Ambulatory Visit: Payer: Self-pay

## 2022-02-19 ENCOUNTER — Ambulatory Visit
Admission: RE | Admit: 2022-02-19 | Discharge: 2022-02-19 | Disposition: A | Discharge status: Home / Self Care | Payer: Medicare HMO | Source: Ambulatory Visit | Attending: Radiation Oncology | Admitting: Radiation Oncology

## 2022-02-19 DIAGNOSIS — Z170 Estrogen receptor positive status [ER+]: Secondary | ICD-10-CM | POA: Diagnosis not present

## 2022-02-19 DIAGNOSIS — C50512 Malignant neoplasm of lower-outer quadrant of left female breast: Secondary | ICD-10-CM | POA: Diagnosis not present

## 2022-02-19 LAB — RAD ONC ARIA SESSION SUMMARY
Course Elapsed Days: 38
Plan Fractions Treated to Date: 3
Plan Prescribed Dose Per Fraction: 2 Gy
Plan Total Fractions Prescribed: 5
Plan Total Prescribed Dose: 10 Gy
Reference Point Dosage Given to Date: 6 Gy
Reference Point Session Dosage Given: 2 Gy
Session Number: 28

## 2022-02-20 ENCOUNTER — Ambulatory Visit
Admission: RE | Admit: 2022-02-20 | Discharge: 2022-02-20 | Disposition: A | Discharge status: Home / Self Care | Payer: Medicare HMO | Source: Ambulatory Visit | Attending: Radiation Oncology | Admitting: Radiation Oncology

## 2022-02-20 ENCOUNTER — Other Ambulatory Visit: Payer: Self-pay

## 2022-02-20 DIAGNOSIS — C50512 Malignant neoplasm of lower-outer quadrant of left female breast: Secondary | ICD-10-CM | POA: Diagnosis not present

## 2022-02-20 DIAGNOSIS — Z170 Estrogen receptor positive status [ER+]: Secondary | ICD-10-CM | POA: Diagnosis not present

## 2022-02-20 LAB — RAD ONC ARIA SESSION SUMMARY
Course Elapsed Days: 39
Plan Fractions Treated to Date: 4
Plan Prescribed Dose Per Fraction: 2 Gy
Plan Total Fractions Prescribed: 5
Plan Total Prescribed Dose: 10 Gy
Reference Point Dosage Given to Date: 8 Gy
Reference Point Session Dosage Given: 2 Gy
Session Number: 29

## 2022-02-23 ENCOUNTER — Other Ambulatory Visit: Payer: Self-pay

## 2022-02-23 ENCOUNTER — Ambulatory Visit: Payer: Medicare HMO

## 2022-02-23 ENCOUNTER — Ambulatory Visit
Admission: RE | Admit: 2022-02-23 | Discharge: 2022-02-23 | Disposition: A | Discharge status: Home / Self Care | Payer: Medicare HMO | Source: Ambulatory Visit | Attending: Radiation Oncology | Admitting: Radiation Oncology

## 2022-02-23 ENCOUNTER — Encounter: Payer: Self-pay | Admitting: *Deleted

## 2022-02-23 DIAGNOSIS — C50512 Malignant neoplasm of lower-outer quadrant of left female breast: Secondary | ICD-10-CM | POA: Diagnosis not present

## 2022-02-23 DIAGNOSIS — Z510 Encounter for antineoplastic radiation therapy: Secondary | ICD-10-CM | POA: Diagnosis not present

## 2022-02-23 DIAGNOSIS — Z170 Estrogen receptor positive status [ER+]: Secondary | ICD-10-CM | POA: Diagnosis not present

## 2022-02-23 LAB — RAD ONC ARIA SESSION SUMMARY
Course Elapsed Days: 42
Plan Fractions Treated to Date: 5
Plan Prescribed Dose Per Fraction: 2 Gy
Plan Total Fractions Prescribed: 5
Plan Total Prescribed Dose: 10 Gy
Reference Point Dosage Given to Date: 10 Gy
Reference Point Session Dosage Given: 2 Gy
Session Number: 30

## 2022-02-23 MED ORDER — RADIAPLEXRX EX GEL
Freq: Once | CUTANEOUS | Status: AC
  Administered 2022-02-23: 1 via TOPICAL

## 2022-02-24 ENCOUNTER — Ambulatory Visit: Payer: Medicare HMO

## 2022-02-24 NOTE — Progress Notes (Signed)
Patient Care Team: Lujean Amel, MD as PCP - General (Family Medicine) Mauro Kaufmann, RN as Oncology Nurse Navigator Rockwell Germany, RN as Oncology Nurse Navigator Jovita Kussmaul, MD as Consulting Physician (General Surgery) Nicholas Lose, MD as Consulting Physician (Hematology and Oncology) Eppie Gibson, MD as Attending Physician (Radiation Oncology)  DIAGNOSIS: No diagnosis found.  SUMMARY OF ONCOLOGIC HISTORY: Oncology History  Malignant neoplasm of lower-outer quadrant of left breast of female, estrogen receptor positive ([place])  11/20/2021 Initial Diagnosis   Screening mammogram detected left breast distortion.  Ultrasound revealed 2 areas of abnormality both of which were biopsied and were found to be benign fibroadenoma and fibrocystic change.  Stereotactic biopsy of the distortion revealed grade 1 IDC with DCIS ER 98%, PR 0%, Ki-67 10%, HER2 negative   11/26/2021 Cancer Staging   Staging form: Breast, AJCC 8th Edition - Clinical stage from 11/26/2021: Stage IA (cT1c, cN0, cM0, G1, ER+, PR-, HER2-) - Signed by Nicholas Lose, MD on 11/26/2021 Stage prefix: Initial diagnosis Histologic grading system: 3 grade system     CHIEF COMPLIANT: Follow-up left breast cancer  INTERVAL HISTORY: Willie Loy is a 66 y.o. female is here because of recent diagnosis of left breast cancer. She presents to the clinic for a follow-up.   ALLERGIES:  has No Known Allergies.  MEDICATIONS:  Current Outpatient Medications  Medication Sig Dispense Refill   aspirin 81 MG EC tablet Take 81 mg by mouth daily. (Patient not taking: Reported on 01/06/2022)     atorvastatin (LIPITOR) 10 MG tablet Take 10 mg by mouth at bedtime.     Cranberry 500 MG TABS Take 500 mg by mouth 2 (two) times daily.     felbamate (FELBATOL) 600 MG tablet Take 0.5 tablets (300 mg total) by mouth 2 (two) times daily. 90 tablet 1   levETIRAcetam (KEPPRA) 500 MG tablet TAKE 1 TABLET BY MOUTH TWICE A DAY (Patient taking  differently: Take 750 mg by mouth 2 (two) times daily.) 180 tablet 0   Lidocaine 4 % GEL Apply as directed to affected skin PRN. 30 g 1   Loteprednol Etabonate 0.5 % GEL Place 1 drop into the right eye 3 (three) times daily.     Omega-3 Fatty Acids (FISH OIL) 1000 MG CPDR Take 1,000 mg by mouth 2 (two) times daily.     omeprazole (PRILOSEC) 40 MG capsule Take 40 mg by mouth every morning.     oxyCODONE (ROXICODONE) 5 MG immediate release tablet Take 1 tablet (5 mg total) by mouth every 6 (six) hours as needed for severe pain. (Patient not taking: Reported on 01/06/2022) 10 tablet 0   PRESCRIPTION MEDICATION Apply 1 Application topically 4 (four) times daily. Compound apply to lips Amitriptyline -Gabapentin-Lidocaine Cream 5/10/5%     Propylene Glycol, PF, (SYSTANE COMPLETE PF) 0.6 % SOLN Place 1 drop into the left eye 3 (three) times daily.     topiramate (TOPAMAX) 200 MG tablet TAKE 1 TABLET BY MOUTH TWICE A DAY (Patient taking differently: Take 200 mg by mouth 2 (two) times daily.) 180 tablet 3   TURMERIC CURCUMIN PO Take 500 mg by mouth 2 (two) times daily.     No current facility-administered medications for this visit.    PHYSICAL EXAMINATION: ECOG PERFORMANCE STATUS: {CHL ONC ECOG PS:[phone]}  There were no vitals filed for this visit. There were no vitals filed for this visit.  BREAST:*** No palpable masses or nodules in either right or left breasts. No palpable axillary  supraclavicular or infraclavicular adenopathy no breast tenderness or nipple discharge. (exam performed in the presence of a chaperone)  LABORATORY DATA:  I have reviewed the data as listed    Latest Ref Rng & Units 11/26/2021   12:06 PM 06/25/2021    2:14 PM 08/25/2019   10:35 AM  CMP  Glucose 70 - 99 mg/dL 97  104  100   BUN 8 - 23 mg/dL _0 Creatinine 0.44 - 1.00 mg/dL 0.95  1.60  0.81   Sodium 135 - 145 mmol/L 142  138  140   Potassium 3.5 - 5.1 mmol/L 4.5  3.7  3.5   Chloride 98 - 111 mmol/L  112  108  109   CO2 22 - 32 mmol/L 25   23   Calcium 8.9 - 10.3 mg/dL 9.0   9.0   Total Protein 6.5 - 8.1 g/dL 6.9     Total Bilirubin 0.3 - 1.2 mg/dL 0.3     Alkaline Phos 38 - 126 U/L 114     AST 15 - 41 U/L 14     ALT 0 - 44 U/L 13       Lab Results  Component Value Date   WBC 6.8 11/26/2021   HGB 12.8 11/26/2021   HCT 39.2 11/26/2021   MCV 92.0 11/26/2021   PLT 269 11/26/2021   NEUTROABS 3.3 11/26/2021    ASSESSMENT & PLAN:  No problem-specific Assessment & Plan notes found for this encounter.    No orders of the defined types were placed in this encounter.  The patient has a good understanding of the overall plan. she agrees with it. she will call with any problems that may develop before the next visit here. Total time spent: 30 mins including face to face time and time spent for planning, charting and co-ordination of care   Suzzette Righter, [place] 02/24/22    I Gardiner Coins am acting as a Education administrator for [employer]  ***

## 2022-02-25 ENCOUNTER — Inpatient Hospital Stay: Payer: Medicare HMO | Attending: Hematology and Oncology | Admitting: Hematology and Oncology

## 2022-02-25 VITALS — BP 148/70 | HR 73 | Temp 98.1°F | Resp 16 | Wt 165.7 lb

## 2022-02-25 DIAGNOSIS — Z170 Estrogen receptor positive status [ER+]: Secondary | ICD-10-CM | POA: Diagnosis not present

## 2022-02-25 DIAGNOSIS — Z79811 Long term (current) use of aromatase inhibitors: Secondary | ICD-10-CM | POA: Insufficient documentation

## 2022-02-25 DIAGNOSIS — C50512 Malignant neoplasm of lower-outer quadrant of left female breast: Secondary | ICD-10-CM | POA: Diagnosis not present

## 2022-02-25 MED ORDER — ANASTROZOLE 1 MG PO TABS: 1.0000 mg | ORAL_TABLET | Freq: Every day | ORAL | 3 refills | Status: DC

## 2022-02-25 NOTE — Assessment & Plan Note (Signed)
11/20/2021:Screening mammogram detected left breast distortion.  Ultrasound revealed 2 areas of abnormality both of which were biopsied and were found to be benign fibroadenoma and fibrocystic change.  Stereotactic biopsy of the distortion revealed grade 1 IDC with DCIS ER 98%, PR 0%, Ki-67 10%, HER2 negative   12/08/2021: Left lumpectomy: Grade 1 IDC 0.9 cm, intermediate grade DCIS, margins negative, lymphovascular invasion present, 1/6 lymph nodes positive with nodal extension, ER 98%, PR 0%, HER2 negative 1+, Ki-67 10%  Oncotype DX testing: Score 22 (ROR 18%) no chemotherapy  Adjuvant radiation therapy: 01/13/2022-02/23/2022  Treatment plan: Anastrozole 1 mg p.o. daily to start 03/09/2022  Anastrozole counseling: We discussed the risks and benefits of anti-estrogen therapy with aromatase inhibitors. These include but not limited to insomnia, hot flashes, mood changes, vaginal dryness, bone density loss, and weight gain. We strongly believe that the benefits far outweigh the risks. Patient understands these risks and consented to starting treatment. Planned treatment duration is 7 years.  Return to clinic in 3 months for survivorship care plan visit

## 2022-03-11 DIAGNOSIS — G40219 Localization-related (focal) (partial) symptomatic epilepsy and epileptic syndromes with complex partial seizures, intractable, without status epilepticus: Secondary | ICD-10-CM | POA: Diagnosis not present

## 2022-03-11 DIAGNOSIS — N200 Calculus of kidney: Secondary | ICD-10-CM | POA: Diagnosis not present

## 2022-03-13 NOTE — Radiation Completion Notes (Signed)
Patient Name: Amber Bowman, Amber Bowman MRN: [id number] Date of Birth: February 12, 1956 Referring Physician: Nicholas Lose, M.D. Date of Service: 2022-03-13 Radiation Oncologist: Eppie Gibson, M.D. [place]                             Radiation Oncology End of Treatment Note     Diagnosis: C50.512 Malignant neoplasm of lower-outer quadrant of left female breast Staging on 2021-11-26: Malignant neoplasm of lower-outer quadrant of left breast of female, estrogen receptor positive ([place]) T=cT1c, N=cN0, M=cM0 Intent: Curative     ==========DELIVERED PLANS==========  First Treatment Date: 2022-01-12 - Last Treatment Date: 2022-02-23   Plan Name: Breast_L Site: Breast, Left Technique: 3D Mode: Photon Dose Per Fraction: 2 Gy Prescribed Dose (Delivered / Prescribed): 50 Gy / 50 Gy Prescribed Fxs (Delivered / Prescribed): 25 / 25   Plan Name: Breast_L_SCV Site: Breast, Left Technique: 3D Mode: Photon Dose Per Fraction: 2 Gy Prescribed Dose (Delivered / Prescribed): 50 Gy / 50 Gy Prescribed Fxs (Delivered / Prescribed): 25 / 25   Plan Name: Breast_L_Bst Site: Breast, Left Technique: 3D Mode: Photon Dose Per Fraction: 2 Gy Prescribed Dose (Delivered / Prescribed): 10 Gy / 10 Gy Prescribed Fxs (Delivered / Prescribed): 5 / 5     ==========ON TREATMENT VISIT DATES========== 2022-01-12, 2022-01-19, 2022-01-26, 2022-02-02, 2022-02-09, 2022-02-16, 2022-02-23     ==========UPCOMING VISITS==========       ==========APPENDIX - ON TREATMENT VISIT NOTES==========   PatEd 2022-01-12 Ongoing education performed.   ImpPlan 2022-01-12 The patient is tolerating radiation. Continue treatment as planned.   PhysExam 2022-01-12 Alert, no acute distress.   ProgNote 2022-01-12 Changes from last week/visit? [ No ] Pain? [ No ] Fatigue? [ No ] Skin irritation? [ No ] Using lotions? [ No ] Lymphedema? [ No ] Issues with ROM? [ No ] Need refills: [ No ] Additional  Weekly  Progress Notes [ none at this time.  ]    RunningNotes 2022-01-12 01-12-22 education performed    PatEd 2022-01-19 Ongoing education performed.   ImpPlan 2022-01-19 The patient is tolerating radiation. Continue treatment as planned.   PhysExam 2022-01-19 Alert, no acute distress.   ProgNote 2022-01-19 Changes from last week/visit? [ No ] Pain? [ Yes, shooting pain in left breast ] Fatigue? [ No ] Skin irritation? [ No ] Using lotions? [ Yes ] Lymphedema? [ No ] Issues with ROM? [ no ] Need refills: [  ] Additional  Weekly Progress Notes [ concern for how long shooting pains will last ]    PatEd 2022-01-26 Ongoing education performed.   ImpPlan 2022-01-26 The patient is tolerating radiation. Continue treatment as planned.   PhysExam 2022-01-26 Alert, no acute distress.   ProgNote 2022-01-26 Changes from last week/visit? [ No ] Pain? [ No; denies ] Fatigue? [ No ] Skin irritation? [ No ] Using lotions? [ Yes ] Lymphedema? [ No ] Issues with ROM? [ No ] Need refills: [ No ] Additional  Weekly Progress Notes [ Denies any new issues or concerns. Tolerating treatment well ]    PatEd 2022-02-02 Ongoing education performed.   ImpPlan 2022-02-02 The patient is tolerating radiation. Continue treatment as planned.   PhysExam 2022-02-02 Alert, no acute distress.   ProgNote 2022-02-02 Changes from last week/visit? [ No ] Pain? [ Yes- Sharp, shooting LT breast 7/10 ] Fatigue? [ Yes- Mild ] Skin irritation? [ No ] Using lotions? [ Yes ] Lymphedema? [ Yes ] Issues  with ROM? [ No ] Need refills: [ No ] Additional  Weekly Progress Notes [  ]    PatEd 2022-02-09 Ongoing education performed.   PhysExam 2022-02-09 Alert, no acute distress.   ImpPlan 2022-02-09 The patient is tolerating radiation. Continue treatment as planned.   ProgNote 2022-02-09 Changes from last week/visit? [ Yes, itchy rash on chest ] Pain? [ No ] Fatigue? [ Yes, mild ] Skin irritation? [ Yes, rash  on chest ] Using lotions? [ Yes ] Lymphedema? [ No ] Issues with ROM? [ No ] Need refills: [ No ] Additional  Weekly Progress Notes [  ]    PatEd 2022-02-16 Ongoing education performed.   ImpPlan 2022-02-16 The patient is tolerating radiation. Continue treatment as planned.   PhysExam 2022-02-16 Alert, no acute distress.   ProgNote 2022-02-16 Changes from last week/visit? [ Yes, chest rash burns more ] Pain? [ Yes, chest rash burns and hurts ] Fatigue? [ Yes, mild fatigue ] Skin irritation? [ Yes, chest rash ] Using lotions? [ Yes ] Lymphedema? [ No ] Issues with ROM? [ No ] Need refills: [ No ] Additional  Weekly Progress Notes [ wants to ask about aloe for rash ]    PatEd 2022-02-20 Ongoing education performed.   ImpPlan 2022-02-20 The patient is tolerating radiation. Continue treatment as planned.   PhysExam 2022-02-20 Alert, no acute distress.   PatEd 2022-02-23 Ongoing education performed.   ImpPlan 2022-02-23 The patient is tolerating radiation. Continue treatment as planned.   PhysExam 2022-02-23 Alert, no acute distress.   ProgNote 2022-02-23 Changes from last week/visit? [ No ] Pain? [ Yes, chest tenderness at radiation site ] Fatigue? [ No ] Skin irritation? [ Yes, dry, patchy, red skin ] Using lotions? [ Yes ] Lymphedema? [ No ] Issues with ROM? [ No ] Need refills: [ No ] Additional  Weekly Progress Notes [ no major needs this week.  ]

## 2022-03-16 ENCOUNTER — Ambulatory Visit: Payer: Medicare HMO | Attending: General Surgery

## 2022-03-16 VITALS — Wt 162.2 lb

## 2022-03-16 DIAGNOSIS — Z483 Aftercare following surgery for neoplasm: Secondary | ICD-10-CM | POA: Insufficient documentation

## 2022-03-16 NOTE — Therapy (Signed)
OUTPATIENT PHYSICAL THERAPY SOZO SCREENING NOTE   Patient Name: Amber Bowman MRN: [id number] DOB:12/07/1955, 67 y.o., female Today's Date: 03/16/2022  PCP: Lujean Amel, MD REFERRING PROVIDER: Jovita Kussmaul, MD   PT End of Session - 03/16/22 1649     Visit Number 2   # unchanged due to screen only   PT Start Time 1648    PT Stop Time 1652    PT Time Calculation (min) 4 min    Activity Tolerance Patient tolerated treatment well    Behavior During Therapy [facility] for tasks assessed/performed             Past Medical History:  Diagnosis Date   Anemia    Cancer ([place])    left breast   Cardiomyopathy ([place]) 06/08/2019   CHF (congestive heart failure) ([place]) 07/2019   per denies this dx   Colon polyps 2014   Condyloma    Depression    Epilepsy ([place])    last seizure reported was 9//2023   GERD (gastroesophageal reflux disease)    Osteopenia 04/2018   T score -1.8 FRAX 9.5% / 1.1% overall stable from prior DEXA   Seizures ([place])    last on 11/2021,  "few seizures per month " per husband   Thrombocytopenia ([place])    Past Surgical History:  Procedure Laterality Date   BREAST LUMPECTOMY WITH RADIOACTIVE SEED AND SENTINEL LYMPH NODE BIOPSY Left 12/08/2021   Procedure: LEFT BREAST LUMPECTOMY WITH RADIOACTIVE SEED AND SENTINEL LYMPH NODE BIOPSY;  Surgeon: Jovita Kussmaul, MD;  Location: [facility];  Service: General;  Laterality: Left;   CATARACT EXTRACTION Bilateral 03/10/2015   CYSTOSCOPY W/ RETROGRADES Right 06/25/2021   Procedure: CYSTOSCOPY WITH RIGHT RETROGRADE PYELOGRAM, REMOVAL OF BLADDER STONE;  Surgeon: Raynelle Bring, MD;  Location: [facility];  Service: Urology;  Laterality: Right;   CYSTOSCOPY/URETEROSCOPY/HOLMIUM LASER/STENT PLACEMENT Left 09/04/2019   Procedure: CYSTOSCOPY/RETROGRADE/ URETEROSCOPY/HOLMIUM LASER/STENT PLACEMENT/ REMOVAL OF NEPHROSTOMY TUBE;  Surgeon: Raynelle Bring, MD;  Location: [facility];  Service: Urology;  Laterality: Left;   IR NEPHROSTOMY EXCHANGE LEFT   08/17/2019   IR NEPHROSTOMY PLACEMENT LEFT  06/28/2019   Patient Active Problem List   Diagnosis Date Noted   Malignant neoplasm of lower-outer quadrant of left breast of female, estrogen receptor positive ([place]) 40/98/1191   Chronic systolic CHF (congestive heart failure) ([place]) 07/09/2019   Overweight (BMI 25.0-29.9) 07/08/2019   COVID-19 virus infection 07/08/2019   Septic shock due to Escherichia coli ([place]) 07/08/2019   Acute pyelonephritis 07/08/2019   Unspecified mood (affective) disorder ([place]) 07/08/2019   Leukocytosis 07/08/2019   Thrombocytopenia ([place]) 07/08/2019   Acute renal failure (ARF) ([place]) 06/27/2019   Partial epilepsy with impairment of consciousness, intractable ([place]) 11/03/2013   Condyloma    Epilepsy ([place])    Depression     REFERRING DIAG: left breast cancer at risk for lymphedema  THERAPY DIAG: Aftercare following surgery for neoplasm  PERTINENT HISTORY: Patient was diagnosed on 10/27/2021 with 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 She underwent a left lumpectomy and sentinel node biopsy (1/6 nodes positive) on 12/08/2021. It is ER positive, PR negative, and HER2 negative with a Ki67 of 10%. She also has epilepsy.   PRECAUTIONS: left UE Lymphedema risk, None  SUBJECTIVE: Pt returns for her first 3 month L-Dex screen.   PAIN:  Are you having pain? No  SOZO SCREENING: Patient was assessed today using the SOZO machine to determine the lymphedema index score. This was compared to her baseline score. It  was determined that she is within the recommended range when compared to her baseline and no further action is needed at this time. She will continue SOZO screenings. These are done every 3 months for 2 years post operatively followed by every 6 months for 2 years, and then annually.   L-DEX FLOWSHEETS - 03/16/22 1600       L-DEX LYMPHEDEMA SCREENING   Measurement Type Unilateral    L-DEX MEASUREMENT EXTREMITY Upper Extremity    POSITION  Standing     DOMINANT SIDE Right    At Risk Side Left    BASELINE SCORE (UNILATERAL) -0.4    L-DEX SCORE (UNILATERAL) 1.5    VALUE CHANGE (UNILAT) 1.9              Otelia Limes, PTA 03/16/2022, 4:53 PM

## 2022-03-23 DIAGNOSIS — R300 Dysuria: Secondary | ICD-10-CM | POA: Diagnosis not present

## 2022-03-25 DIAGNOSIS — N3000 Acute cystitis without hematuria: Secondary | ICD-10-CM | POA: Diagnosis not present

## 2022-03-26 ENCOUNTER — Ambulatory Visit
Admission: RE | Admit: 2022-03-26 | Discharge: 2022-03-26 | Disposition: A | Discharge status: Home / Self Care | Payer: Medicare HMO | Source: Ambulatory Visit | Attending: Radiation Oncology | Admitting: Radiation Oncology

## 2022-03-26 ENCOUNTER — Telehealth: Payer: Self-pay

## 2022-03-26 ENCOUNTER — Encounter: Payer: Self-pay | Admitting: Radiation Oncology

## 2022-03-26 DIAGNOSIS — R109 Unspecified abdominal pain: Secondary | ICD-10-CM | POA: Diagnosis not present

## 2022-03-26 NOTE — Telephone Encounter (Signed)
I called the patient today about her upcoming follow-up appointment in radiation oncology.   Given the state of the COVID-19 pandemic, concerning case numbers in our community, and guidance from [facility], I offered a phone assessment with the patient to determine if coming to the clinic was necessary. She accepted.  The patient denies any symptomatic concerns.  Specifically, they report good healing of their skin in the radiation fields.  Skin is intact.  Pt denied breast pain did report new mild back pain. She recently went to see MD for this and is being treated. She also denied fatigue or any major concerns at this time.   I recommended that she continue skin care by applying oil or lotion with vitamin E to the skin in the radiation fields, BID, for 2 more months.  Continue follow-up with medical oncology - follow-up is scheduled on 05-21-22 with Thedore Mins .  I explained that yearly mammograms are important for patients with intact breast tissue, and physical exams are important after mastectomy for patients that cannot undergo mammography.  I encouraged her to call if she had further questions or concerns about her healing. Otherwise, she will follow-up PRN in radiation oncology. Patient is pleased with this plan, and we will cancel her upcoming follow-up to reduce the risk of COVID-19 transmission.

## 2022-03-31 ENCOUNTER — Ambulatory Visit (HOSPITAL_BASED_OUTPATIENT_CLINIC_OR_DEPARTMENT_OTHER)
Admission: RE | Admit: 2022-03-31 | Discharge: 2022-03-31 | Disposition: A | Discharge status: Home / Self Care | Payer: Medicare HMO | Source: Ambulatory Visit | Attending: Family Medicine | Admitting: Family Medicine

## 2022-03-31 ENCOUNTER — Other Ambulatory Visit (HOSPITAL_COMMUNITY): Payer: Self-pay | Admitting: Family Medicine

## 2022-03-31 DIAGNOSIS — R109 Unspecified abdominal pain: Secondary | ICD-10-CM | POA: Diagnosis not present

## 2022-03-31 DIAGNOSIS — M549 Dorsalgia, unspecified: Secondary | ICD-10-CM | POA: Diagnosis not present

## 2022-03-31 DIAGNOSIS — N134 Hydroureter: Secondary | ICD-10-CM | POA: Diagnosis not present

## 2022-03-31 DIAGNOSIS — R569 Unspecified convulsions: Secondary | ICD-10-CM | POA: Diagnosis not present

## 2022-03-31 DIAGNOSIS — N200 Calculus of kidney: Secondary | ICD-10-CM | POA: Diagnosis not present

## 2022-04-01 ENCOUNTER — Other Ambulatory Visit: Payer: Self-pay | Admitting: Urology

## 2022-04-01 ENCOUNTER — Ambulatory Visit (HOSPITAL_COMMUNITY): Payer: Medicare HMO | Admitting: Certified Registered"

## 2022-04-01 ENCOUNTER — Encounter (HOSPITAL_COMMUNITY): Payer: Self-pay | Admitting: Urology

## 2022-04-01 ENCOUNTER — Encounter (HOSPITAL_COMMUNITY)
Admission: AD | Disposition: A | Discharge status: Home / Self Care | Payer: Self-pay | Source: Ambulatory Visit | Attending: Urology

## 2022-04-01 ENCOUNTER — Ambulatory Visit (HOSPITAL_COMMUNITY): Payer: Medicare HMO

## 2022-04-01 ENCOUNTER — Other Ambulatory Visit: Payer: Self-pay

## 2022-04-01 ENCOUNTER — Ambulatory Visit (HOSPITAL_COMMUNITY)
Admission: AD | Admit: 2022-04-01 | Discharge: 2022-04-01 | Disposition: A | Discharge status: Home / Self Care | Payer: Medicare HMO | Source: Ambulatory Visit | Attending: Urology | Admitting: Urology

## 2022-04-01 ENCOUNTER — Ambulatory Visit (HOSPITAL_BASED_OUTPATIENT_CLINIC_OR_DEPARTMENT_OTHER): Payer: Medicare HMO | Admitting: Certified Registered"

## 2022-04-01 DIAGNOSIS — N201 Calculus of ureter: Secondary | ICD-10-CM | POA: Diagnosis not present

## 2022-04-01 DIAGNOSIS — N132 Hydronephrosis with renal and ureteral calculous obstruction: Secondary | ICD-10-CM | POA: Diagnosis present

## 2022-04-01 DIAGNOSIS — Z87440 Personal history of urinary (tract) infections: Secondary | ICD-10-CM

## 2022-04-01 DIAGNOSIS — N202 Calculus of kidney with calculus of ureter: Secondary | ICD-10-CM | POA: Diagnosis not present

## 2022-04-01 DIAGNOSIS — R109 Unspecified abdominal pain: Secondary | ICD-10-CM | POA: Diagnosis not present

## 2022-04-01 DIAGNOSIS — N200 Calculus of kidney: Secondary | ICD-10-CM | POA: Diagnosis not present

## 2022-04-01 HISTORY — PX: CYSTOSCOPY WITH RETROGRADE PYELOGRAM, URETEROSCOPY AND STENT PLACEMENT: SHX5789

## 2022-04-01 LAB — CBC WITH DIFFERENTIAL/PLATELET
Abs Immature Granulocytes: 0.12 10*3/uL — ABNORMAL HIGH (ref 0.00–0.07)
Basophils Absolute: 0.1 10*3/uL (ref 0.0–0.1)
Basophils Relative: 1 %
Eosinophils Absolute: 0.1 10*3/uL (ref 0.0–0.5)
Eosinophils Relative: 1 %
HCT: 38 % (ref 36.0–46.0)
Hemoglobin: 12.1 g/dL (ref 12.0–15.0)
Immature Granulocytes: 1 %
Lymphocytes Relative: 9 %
Lymphs Abs: 1.4 10*3/uL (ref 0.7–4.0)
MCH: 29.7 pg (ref 26.0–34.0)
MCHC: 31.8 g/dL (ref 30.0–36.0)
MCV: 93.1 fL (ref 80.0–100.0)
Monocytes Absolute: 1.2 10*3/uL — ABNORMAL HIGH (ref 0.1–1.0)
Monocytes Relative: 8 %
Neutro Abs: 12 10*3/uL — ABNORMAL HIGH (ref 1.7–7.7)
Neutrophils Relative %: 80 %
Platelets: 426 10*3/uL — ABNORMAL HIGH (ref 150–400)
RBC: 4.08 MIL/uL (ref 3.87–5.11)
RDW: 14.1 % (ref 11.5–15.5)
WBC: 14.8 10*3/uL — ABNORMAL HIGH (ref 4.0–10.5)
nRBC: 0 % (ref 0.0–0.2)

## 2022-04-01 LAB — BASIC METABOLIC PANEL
Anion gap: 8 (ref 5–15)
BUN: 19 mg/dL (ref 8–23)
CO2: 22 mmol/L (ref 22–32)
Calcium: 9.4 mg/dL (ref 8.9–10.3)
Chloride: 105 mmol/L (ref 98–111)
Creatinine, Ser: 1.02 mg/dL — ABNORMAL HIGH (ref 0.44–1.00)
GFR, Estimated: >60 mL/min (ref >60)
Glucose, Bld: 129 mg/dL — ABNORMAL HIGH (ref 70–99)
Potassium: 3.9 mmol/L (ref 3.5–5.1)
Sodium: 135 mmol/L (ref 135–145)

## 2022-04-01 SURGERY — CYSTOURETEROSCOPY, WITH RETROGRADE PYELOGRAM AND STENT INSERTION
Anesthesia: General | Site: Ureter | Laterality: Bilateral

## 2022-04-01 MED ORDER — LIDOCAINE HCL URETHRAL/MUCOSAL 2 % EX GEL
CUTANEOUS | Status: AC
  Filled 2022-04-01: qty 30

## 2022-04-01 MED ORDER — LIDOCAINE HCL URETHRAL/MUCOSAL 2 % EX GEL
CUTANEOUS | Status: DC | PRN
  Administered 2022-04-01: 1 via URETHRAL

## 2022-04-01 MED ORDER — LIDOCAINE 2% (20 MG/ML) 5 ML SYRINGE
INTRAMUSCULAR | Status: DC | PRN
  Administered 2022-04-01: 40 mg via INTRAVENOUS

## 2022-04-01 MED ORDER — MIDAZOLAM HCL 2 MG/2ML IJ SOLN
INTRAMUSCULAR | Status: DC | PRN
  Administered 2022-04-01: 2 mg via INTRAVENOUS

## 2022-04-01 MED ORDER — OXYCODONE HCL 5 MG/5ML PO SOLN: 5.0000 mg | Freq: Once | ORAL | Status: DC | PRN

## 2022-04-01 MED ORDER — PHENAZOPYRIDINE HCL 200 MG PO TABS: 200.0000 mg | ORAL_TABLET | Freq: Three times a day (TID) | ORAL | 1 refills | Status: DC | PRN

## 2022-04-01 MED ORDER — DEXAMETHASONE SODIUM PHOSPHATE 10 MG/ML IJ SOLN
INTRAMUSCULAR | Status: AC
  Filled 2022-04-01: qty 1

## 2022-04-01 MED ORDER — DEXAMETHASONE SODIUM PHOSPHATE 10 MG/ML IJ SOLN
INTRAMUSCULAR | Status: DC | PRN
  Administered 2022-04-01: 4 mg via INTRAVENOUS

## 2022-04-01 MED ORDER — ACETAMINOPHEN 160 MG/5ML PO SOLN: 1000.0000 mg | Freq: Once | ORAL | Status: DC | PRN

## 2022-04-01 MED ORDER — LACTATED RINGERS IV SOLN: INTRAVENOUS | Status: DC

## 2022-04-01 MED ORDER — PROPOFOL 10 MG/ML IV BOLUS
INTRAVENOUS | Status: DC | PRN
  Administered 2022-04-01: 140 mg via INTRAVENOUS

## 2022-04-01 MED ORDER — CEFAZOLIN SODIUM-DEXTROSE 2-4 GM/100ML-% IV SOLN
2.0000 g | INTRAVENOUS | Status: AC
  Administered 2022-04-01: 2 g via INTRAVENOUS
  Filled 2022-04-01: qty 100

## 2022-04-01 MED ORDER — ACETAMINOPHEN 10 MG/ML IV SOLN: 1000.0000 mg | Freq: Once | INTRAVENOUS | Status: DC | PRN

## 2022-04-01 MED ORDER — FENTANYL CITRATE (PF) 100 MCG/2ML IJ SOLN
INTRAMUSCULAR | Status: AC
  Filled 2022-04-01: qty 2

## 2022-04-01 MED ORDER — CEFDINIR 300 MG PO CAPS: 300.0000 mg | ORAL_CAPSULE | Freq: Two times a day (BID) | ORAL | 0 refills | Status: DC

## 2022-04-01 MED ORDER — PROPOFOL 10 MG/ML IV BOLUS
INTRAVENOUS | Status: AC
  Filled 2022-04-01: qty 20

## 2022-04-01 MED ORDER — FENTANYL CITRATE (PF) 100 MCG/2ML IJ SOLN
INTRAMUSCULAR | Status: DC | PRN
  Administered 2022-04-01 (×2): 50 ug via INTRAVENOUS

## 2022-04-01 MED ORDER — ONDANSETRON HCL 4 MG/2ML IJ SOLN
INTRAMUSCULAR | Status: AC
  Filled 2022-04-01: qty 2

## 2022-04-01 MED ORDER — ACETAMINOPHEN 500 MG PO TABS: 1000.0000 mg | ORAL_TABLET | Freq: Once | ORAL | Status: DC | PRN

## 2022-04-01 MED ORDER — SODIUM CHLORIDE 0.9 % IR SOLN
Status: DC | PRN
  Administered 2022-04-01: 3000 mL

## 2022-04-01 MED ORDER — FENTANYL CITRATE PF 50 MCG/ML IJ SOSY: 25.0000 ug | PREFILLED_SYRINGE | INTRAMUSCULAR | Status: DC | PRN

## 2022-04-01 MED ORDER — PROPOFOL 10 MG/ML IV BOLUS: INTRAVENOUS | Status: DC | PRN

## 2022-04-01 MED ORDER — MIDAZOLAM HCL 2 MG/2ML IJ SOLN
INTRAMUSCULAR | Status: AC
  Filled 2022-04-01: qty 2

## 2022-04-01 MED ORDER — ONDANSETRON HCL 4 MG/2ML IJ SOLN
INTRAMUSCULAR | Status: DC | PRN
  Administered 2022-04-01: 4 mg via INTRAVENOUS

## 2022-04-01 MED ORDER — OXYCODONE HCL 5 MG PO TABS: 5.0000 mg | ORAL_TABLET | Freq: Once | ORAL | Status: DC | PRN

## 2022-04-01 MED ORDER — CHLORHEXIDINE GLUCONATE 0.12 % MT SOLN
15.0000 mL | Freq: Once | OROMUCOSAL | Status: AC
  Administered 2022-04-01: 15 mL via OROMUCOSAL

## 2022-04-01 SURGICAL SUPPLY — 21 items
BAG COUNTER SPONGE SURGICOUNT (BAG) IMPLANT
BAG SPNG CNTER NS LX DISP (BAG)
BAG URO CATCHER STRL LF (MISCELLANEOUS) ×2 IMPLANT
CATH URETL OPEN 5X70 (CATHETERS) ×2 IMPLANT
CLOTH BEACON ORANGE TIMEOUT ST (SAFETY) ×2 IMPLANT
EXTRACTOR STONE 1.7FRX115CM (UROLOGICAL SUPPLIES) IMPLANT
GLOVE BIO SURGEON STRL SZ8 (GLOVE) ×2 IMPLANT
GOWN STRL REUS W/ TWL XL LVL3 (GOWN DISPOSABLE) ×2 IMPLANT
GOWN STRL REUS W/TWL XL LVL3 (GOWN DISPOSABLE) ×1
GUIDEWIRE STR DUAL SENSOR (WIRE) ×2 IMPLANT
KIT TURNOVER KIT A (KITS) IMPLANT
LASER FIB FLEXIVA PULSE ID 365 (Laser) IMPLANT
LASER FIB FLEXIVA PULSE ID 550 (Laser) IMPLANT
LASER FIB FLEXIVA PULSE ID 910 (Laser) IMPLANT
MANIFOLD NEPTUNE II (INSTRUMENTS) ×2 IMPLANT
NS IRRIG 1000ML POUR BTL (IV SOLUTION) IMPLANT
PACK CYSTO (CUSTOM PROCEDURE TRAY) ×2 IMPLANT
STENT URET 6FRX24 CONTOUR (STENTS) IMPLANT
TRACTIP FLEXIVA PULS ID 200XHI (Laser) IMPLANT
TRACTIP FLEXIVA PULSE ID 200 (Laser)
TUBING CONNECTING 10 (TUBING) ×2 IMPLANT

## 2022-04-01 NOTE — Anesthesia Procedure Notes (Signed)
Procedure Name: LMA Insertion Date/Time: 04/01/2022 5:33 PM  Performed by: Eben Burow, CRNAPre-anesthesia Checklist: Patient identified, Emergency Drugs available, Suction available, Patient being monitored and Timeout performed Patient Re-evaluated:Patient Re-evaluated prior to induction Oxygen Delivery Method: Circle system utilized Preoxygenation: Pre-oxygenation with 100% oxygen Induction Type: IV induction Ventilation: Mask ventilation without difficulty LMA: LMA inserted and LMA with gastric port inserted LMA Size: 4.0 Number of attempts: 1 Tube secured with: Tape Dental Injury: Teeth and Oropharynx as per pre-operative assessment

## 2022-04-01 NOTE — Transfer of Care (Signed)
Immediate Anesthesia Transfer of Care Note  Patient: Amber Bowman  Procedure(s) Performed: CYSTOSCOPY WITH BILATERAL RETROGRADE PYELOGRAM, LEFT URETEROSCOPY LASER STONE MANIPulation AND BILATERAL STENT PLACEMENT (Bilateral: Ureter)  Patient Location: PACU  Anesthesia Type:General  Level of Consciousness: awake, alert , and patient cooperative  Airway & Oxygen Therapy: Patient Spontanous Breathing and Patient connected to face mask oxygen  Post-op Assessment: Report given to RN and Post -op Vital signs reviewed and stable  Post vital signs: Reviewed and stable  Last Vitals:  Vitals Value Taken Time  BP 122/76   Temp    Pulse 106 04/01/22 1817  Resp 14 04/01/22 1817  SpO2 100 % 04/01/22 1817  Vitals shown include unvalidated device data.  Last Pain:  Vitals:   04/01/22 1656  TempSrc: Oral  PainSc:          Complications: No notable events documented.

## 2022-04-01 NOTE — Anesthesia Preprocedure Evaluation (Signed)
Anesthesia Evaluation  Patient identified by MRN, date of birth, ID band Patient awake    Reviewed: Allergy & Precautions, NPO status , Patient's Chart, lab work & pertinent test results  History of Anesthesia Complications Negative for: history of anesthetic complications  Airway Mallampati: II  TM Distance: >3 FB Neck ROM: Full    Dental  (+) Edentulous Upper, Dental Advisory Given, Upper Dentures   Pulmonary neg pulmonary ROS   breath sounds clear to auscultation       Cardiovascular negative cardio ROS  Rhythm:Regular     Neuro/Psych Seizures -, Well Controlled,  PSYCHIATRIC DISORDERS  Depression       GI/Hepatic Neg liver ROS,GERD  Controlled and Medicated,,  Endo/Other    Renal/GU Renal diseaseLab Results      Component                Value               Date                      CREATININE               0.95                11/26/2021              Lab Results      Component                Value               Date                      K                        4.5                 11/26/2021                Musculoskeletal negative musculoskeletal ROS (+)    Abdominal   Peds  Hematology negative hematology ROS (+) Lab Results      Component                Value               Date                      WBC                      6.8                 11/26/2021                HGB                      12.8                11/26/2021                HCT                      39.2                11/26/2021                MCV  92.0                11/26/2021                PLT                      269                 11/26/2021              Anesthesia Other Findings   Reproductive/Obstetrics                             Anesthesia Physical Anesthesia Plan  ASA: 2  Anesthesia Plan: General   Post-op Pain Management: Minimal or no pain anticipated   Induction:  Intravenous  PONV Risk Score and Plan: 3 and Ondansetron and Dexamethasone  Airway Management Planned: LMA  Additional Equipment: None  Intra-op Plan:   Post-operative Plan: Extubation in OR  Informed Consent: I have reviewed the patients History and Physical, chart, labs and discussed the procedure including the risks, benefits and alternatives for the proposed anesthesia with the patient or authorized representative who has indicated his/her understanding and acceptance.     Dental advisory given  Plan Discussed with: CRNA  Anesthesia Plan Comments:        Anesthesia Quick Evaluation

## 2022-04-01 NOTE — Interval H&P Note (Signed)
History and Physical Interval Note:  04/01/2022 5:07 PM  Amber Bowman  has presented today for surgery, with the diagnosis of LEFT URETEROVESICAL JUNCTION STONE, RIGHT URETEROPELVIC JUCTION STONE.  The various methods of treatment have been discussed with the patient and family. After consideration of risks, benefits and other options for treatment, the patient has consented to  Procedure(s): CYSTOSCOPY WITH BILATERAL RETROGRADE PYELOGRAM, LEFT URETEROSCOPY [place] (Bilateral) as a surgical intervention.  The patient's history has been reviewed, patient examined, no change in status, stable for surgery.  I have reviewed the patient's chart and labs.  Questions were answered to the patient's satisfaction.     Michaelle Birks

## 2022-04-01 NOTE — Op Note (Signed)
OPERATIVE NOTE   Patient Name: Amber Bowman  MRN: [id number]   Date of Procedure: 04/01/22   Preoperative diagnosis:  Left distal ureteral calculus Right renal pelvic calculus Left flank pain History of UTI  Postoperative diagnosis:  Left distal ureteral calculus Right renal pelvic calculus Left flank pain History of UTI  Procedure:  Cystoscopy  Bilateral retrograde pyelograms with intraoperative interpretation Left ureteroscopy with stone manipulation (calculus obtained) Insertion of bilateral ureteral stents (81F x 24 cm, tether on left)  Attending: Primus Bravo, MD  Anesthesia: General  Estimated blood loss: 5 mL  Fluids: Per anesthesia record  Drains: Bilateral 81F x 24 cm ureteral stents (tether on left)  Specimens: Ureteral calculus given to patient  Antibiotics: Ancef 2 g IV  Findings: Normal urethra and bladder; 4 mm calculus in distal left ureter with proximal dilation extending to the left renal pelvis and calyces; 8 mm calculus in right renal pelvis near the UPJ without significant obstruction  Indications:  67 year old female with a history of nephrolithiasis presents now for endoscopic management of bilateral ureteral calculi.  She has had a 3-week history of left back pain with associated frequency, urgency, and dysuria.  Her urinalysis was suspicious for a UTI on 03/23/2022.  Urine culture grew >100 K E. coli.  She received Rocephin and Cipro on 03/25/2022.  She completed the Cipro yesterday.  She was evaluated with CT imaging on 03/31/2022 which showed a 4 mm calculus in the left distal ureter with associated obstruction and an 8 mm calculus in the right renal pelvis near the right UPJ without significant obstruction.  Treatment options were discussed with the patient.  She presents now for cystoscopy, bilateral retrograde pyelograms, left ureteroscopy, possible laser lithotripsy, stone manipulation, and insertion of bilateral ureteral stents.  I  recommended a staged procedure for the right proximal calculus with stent placement today followed by shockwave lithotripsy in the near future.  Risk and benefits of the treatment were discussed in detail.  She understands wishes to proceed as described.  Description of Procedure:  Patient received IV Ancef preoperatively.  After successful induction of a general anesthetic, the patient was placed in the dorsolithotomy position.  The patient's genitalia was prepped and draped in sterile fashion.  Under direct visualization, a 64 French rigid cystoscope was passed through the urethra and into the bladder.  The bladder was inspected throughout its entirety.  No mucosal lesions were seen.  No stones were seen within the bladder.  A normal-appearing trigone was noted with a single ureteral orifice bilaterally.  Bilateral retrograde pyelograms were performed for evaluation of the patient's upper tracts given her history of nephrolithiasis and UTI.  Scout film showed a nonspecific bowel gas pattern.  Calcifications were seen in both renal shadows.  No obvious bony abnormalities were appreciated.  Using a 5 [place] open-ended catheter, contrast was injected into the right ureter.  The right distal and mid ureter were normal in appearance.  The previously noted calcifications in the right renal shadow were confirmed to be within the right renal pelvis.  The largest measured 8 mm consistent with the stone seen on CT.  There was no significant obstruction noted on the right.  In a similar fashion, contrast was injected into the left ureter.  A 4 mm filling defect was noted in the left distal ureter with proximal dilation.  A sensor guidewire was then placed into the left ureter and into the left renal pelvis under fluoroscopic guidance.  Ureteroscopy was performed  alongside the guidewire.  The stone was identified in the distal ureter.  Using a stone basket, the stone was retrieved.  Ureteroscopy was again performed and  no remaining stone fragments were noted.  The ureter was normal in appearance.  There was some mucosal edema noted in the distal ureter consistent with the prior stone location.  A 6 French by 24 cm double-J stent was then passed over the guidewire and into the left renal pelvis under cystoscopic and fluoroscopic guidance.  The tether was left attached and brought through the urethral meatus.  Attention was then turned to the right side.  A sensor guidewire was passed into the right ureter and into the right renal pelvis under fluoroscopic guidance.  A 6 French by 24 cm double-J stent was then passed over the guidewire into the right renal pelvis.  Position was confirmed with fluoroscopy.  The tether was removed prior to stent placement.  Once adequate stent position was confirmed, the bladder was drained and the cystoscope was removed.  The patient received intraurethral lidocaine jelly.  The tether was cut short and placed into the vaginal vault.  The patient was then extubated and taken to the postanesthesia care unit in stable condition.  Complications: None  Condition: Stable, extubated, transferred to PACU  Plan:  D/C home  Return to office in 1 week for left stent removal and to arrange treatment for right renal pelvic stone

## 2022-04-01 NOTE — H&P (Signed)
Assessment: Left distal ureteral calculus Right UPJ calculus History of UTI Flank pain   Plan: I personally reviewed the patient's chart including provider notes, lab results, and imaging results. I personally reviewed the CT study from 03/31/2022 which shows bilateral nephrolithiasis, an 8 mm calculus at the right UPJ and a 4 mm calculus in the left distal ureter with associated hydronephrosis. Treatment options were discussed with the patient in detail.  Given the findings of bilateral ureteral calculi and significant flank pain, I have recommended surgical management with cystoscopy, bilateral retrograde pyelograms, left ureteroscopy with laser lithotripsy, and insertion of bilateral ureteral stents. She presents now for cystoscopy, bilateral retrograde pyelograms, left ureteroscopic stone manipulation with laser lithotripsy, and insertion of bilateral ureteral stents.  I recommended a staged procedure for management of the right proximal ureteral calculus with subsequent shockwave lithotripsy. Risks, benefits, alternatives were discussed with the patient in detail.  Potential risks including, but not limited to, infection; bleeding;  injury to urethra, bladder, or ureter; possible need of other treatments; possible failure to remove the calculus; ureteral  stricture formation; cardiac, pulmonary, cerebrovascular events; and anesthetic complications were discussed.  The patient understands and wishes to proceed.   Chief Complaint: Bilateral ureteral calculi  History of Present Illness:  Amber Bowman is a 67 y.o. female who presents for surgical management of bilateral ureteral calculi.  She has a history of nephrolithiasis undergoing endoscopic management in 2021 and in 2023.  She recently presented to the [facility] office in [place] on 03/23/2022 with a 3-week history of left back pain.  She was also having frequency, urgency, and dysuria.  No fevers, chills, or gross hematuria.   Her urinalysis was suspicious for UTI.  She was initially started on Keflex.  She returned on 03/25/2022 with worsening symptoms including increased pain.  A urine culture at that time grew >100 K E. coli, pansensitive.  She was treated with Rocephin and started on Cipro x 1 week.  Due to continued pain, she underwent evaluation with a CT scan on 03/31/2022.  The study showed bilateral nephrolithiasis, migration of a upper pole stone on the right into the right renal pelvis measuring 8 mm in size, mild left hydronephrosis due to a distal left ureteral calculus measuring 4 mm.  Past Medical History:  Past Medical History:  Diagnosis Date   Anemia    Cancer ([place])    left breast   Cardiomyopathy ([place]) 06/08/2019   CHF (congestive heart failure) ([place]) 07/2019   per denies this dx   Colon polyps 2014   Condyloma    Depression    Epilepsy ([place])    last seizure reported was 9//2023   GERD (gastroesophageal reflux disease)    Osteopenia 04/2018   T score -1.8 FRAX 9.5% / 1.1% overall stable from prior DEXA   Seizures ([place])    last on 11/2021,  "few seizures per month " per husband   Thrombocytopenia ([place])     Past Surgical History:  Past Surgical History:  Procedure Laterality Date   BREAST LUMPECTOMY WITH RADIOACTIVE SEED AND SENTINEL LYMPH NODE BIOPSY Left 12/08/2021   Procedure: LEFT BREAST LUMPECTOMY WITH RADIOACTIVE SEED AND SENTINEL LYMPH NODE BIOPSY;  Surgeon: Jovita Kussmaul, MD;  Location: [place];  Service: General;  Laterality: Left;   CATARACT EXTRACTION Bilateral 03/10/2015   CYSTOSCOPY W/ RETROGRADES Right 06/25/2021   Procedure: CYSTOSCOPY WITH RIGHT RETROGRADE PYELOGRAM, REMOVAL OF BLADDER STONE;  Surgeon: Raynelle Bring, MD;  Location[facility];  Service:  Urology;  Laterality: Right;   CYSTOSCOPY/URETEROSCOPY/HOLMIUM LASER/STENT PLACEMENT Left 09/04/2019   Procedure: CYSTOSCOPY/RETROGRADE/ URETEROSCOPY/HOLMIUM LASER/STENT PLACEMENT/ REMOVAL OF NEPHROSTOMY TUBE;  Surgeon: Raynelle Bring, MD;  Location[facility];  Service: Urology;  Laterality: Left;   IR NEPHROSTOMY EXCHANGE LEFT  08/17/2019   IR NEPHROSTOMY PLACEMENT LEFT  06/28/2019    Allergies:  No Known Allergies  Family History:  Family History  Problem Relation Age of Onset   Hypertension Mother    Diabetes Mother    Heart disease Mother    Hypertension Father    Heart disease Father    Diabetes Sister    Hypertension Sister    Heart disease Sister    Skin cancer Paternal Uncle    Seizures Neg Hx     Social History:  Social History   Tobacco Use   Smoking status: Never   Smokeless tobacco: Never  Vaping Use   Vaping Use: Never used  Substance Use Topics   Alcohol use: No    Alcohol/week: 0.0 standard drinks of alcohol   Drug use: No    Review of symptoms:  Constitutional:  Negative for unexplained weight loss, night sweats, fever, chills ENT:  Negative for nose bleeds, sinus pain, painful swallowing CV:  Negative for chest pain, shortness of breath, exercise intolerance, palpitations, loss of consciousness Resp:  Negative for cough, wheezing, shortness of breath GI:  Negative for nausea, vomiting, diarrhea, bloody stools GU:  Positives noted in HPI; otherwise negative for gross hematuria, dysuria, urinary incontinence Neuro:  Negative for seizures, poor balance, limb weakness, slurred speech Psych:  Negative for lack of energy, depression, anxiety Endocrine:  Negative for polydipsia, polyuria, symptoms of hypoglycemia (dizziness, hunger, sweating) Hematologic:  Negative for anemia, purpura, petechia, prolonged or excessive bleeding, use of anticoagulants  Allergic:  Negative for difficulty breathing or choking as a result of exposure to anything; no shellfish allergy; no allergic response (rash/itch) to materials, foods  Physical exam: LMP 02/11/2008  GENERAL APPEARANCE:  Well appearing, well developed, well nourished, NAD HEENT: Atraumatic, Normocephalic, oropharynx clear. NECK:  Supple without lymphadenopathy or thyromegaly. LUNGS: Clear to auscultation bilaterally. HEART: Regular Rate and Rhythm without murmurs, gallops, or rubs. ABDOMEN: Soft, non-tender, No Masses. EXTREMITIES: Moves all extremities well.  Without clubbing, cyanosis, or edema. NEUROLOGIC:  Alert and oriented x 3, normal gait, CN II-XII grossly intact.  MENTAL STATUS:  Appropriate. BACK:  Non-tender to palpation.  No CVAT SKIN:  Warm, dry and intact.    Results: None

## 2022-04-02 ENCOUNTER — Other Ambulatory Visit: Payer: Self-pay | Admitting: Urology

## 2022-04-02 ENCOUNTER — Encounter (HOSPITAL_COMMUNITY): Payer: Self-pay | Admitting: Urology

## 2022-04-02 DIAGNOSIS — N201 Calculus of ureter: Secondary | ICD-10-CM

## 2022-04-02 MED ORDER — PHENAZOPYRIDINE HCL 200 MG PO TABS: 200.0000 mg | ORAL_TABLET | Freq: Three times a day (TID) | ORAL | 1 refills | Status: DC | PRN

## 2022-04-02 MED ORDER — CEFDINIR 300 MG PO CAPS: 300.0000 mg | ORAL_CAPSULE | Freq: Two times a day (BID) | ORAL | 0 refills | Status: DC

## 2022-04-02 NOTE — Anesthesia Postprocedure Evaluation (Signed)
Anesthesia Post Note  Patient: Amber Bowman  Procedure(s) Performed: CYSTOSCOPY WITH BILATERAL RETROGRADE PYELOGRAM, LEFT URETEROSCOPY LASER STONE MANIPulation AND BILATERAL STENT PLACEMENT (Bilateral: Ureter)     Patient location during evaluation: PACU Anesthesia Type: General Level of consciousness: awake and alert Pain management: pain level controlled Vital Signs Assessment: post-procedure vital signs reviewed and stable Respiratory status: spontaneous breathing, nonlabored ventilation and respiratory function stable Cardiovascular status: blood pressure returned to baseline and stable Postop Assessment: no apparent nausea or vomiting Anesthetic complications: no   No notable events documented.  Last Vitals:  Vitals:   04/01/22 1843 04/01/22 1900  BP: 126/70 (!) 140/81  Pulse: 100 98  Resp: 18 18  Temp:    SpO2: 96% 98%    Last Pain:  Vitals:   04/01/22 1900  TempSrc:   PainSc: 0-No pain                 Deshawn Skelley

## 2022-04-03 ENCOUNTER — Telehealth: Payer: Self-pay | Admitting: Urology

## 2022-04-03 NOTE — Telephone Encounter (Signed)
Patients husband left a v.mail wanting to know if his wife can take more than prescribed pain pills due to her extensive pain & asked for a call back!  -lmr.

## 2022-04-03 NOTE — Telephone Encounter (Signed)
Returned pts call. Pt states she is doing better now as she got her Rx yesterday. There was a delay in pt getting her pain meds from pharmacy, Pt states husband left VM asking if she could take extra since there was a delay however she did not take extra. Pt states pain in controlled now and she is feeling better.

## 2022-04-09 ENCOUNTER — Other Ambulatory Visit: Payer: Self-pay

## 2022-04-09 ENCOUNTER — Ambulatory Visit (INDEPENDENT_AMBULATORY_CARE_PROVIDER_SITE_OTHER): Payer: Medicare HMO | Admitting: Urology

## 2022-04-09 ENCOUNTER — Encounter: Payer: Self-pay | Admitting: Urology

## 2022-04-09 ENCOUNTER — Ambulatory Visit (HOSPITAL_BASED_OUTPATIENT_CLINIC_OR_DEPARTMENT_OTHER)
Admission: RE | Admit: 2022-04-09 | Discharge: 2022-04-09 | Disposition: A | Discharge status: Home / Self Care | Payer: Medicare HMO | Source: Ambulatory Visit | Attending: Urology | Admitting: Urology

## 2022-04-09 VITALS — BP 118/77 | HR 121 | Ht 63.0 in | Wt 153.0 lb

## 2022-04-09 DIAGNOSIS — N201 Calculus of ureter: Secondary | ICD-10-CM

## 2022-04-09 DIAGNOSIS — N200 Calculus of kidney: Secondary | ICD-10-CM | POA: Diagnosis not present

## 2022-04-09 DIAGNOSIS — Z87440 Personal history of urinary (tract) infections: Secondary | ICD-10-CM | POA: Diagnosis not present

## 2022-04-09 DIAGNOSIS — R8290 Unspecified abnormal findings in urine: Secondary | ICD-10-CM

## 2022-04-09 DIAGNOSIS — Z466 Encounter for fitting and adjustment of urinary device: Secondary | ICD-10-CM | POA: Diagnosis not present

## 2022-04-09 LAB — URINALYSIS
Glucose, UA: 100 mg/dL — AB
Nitrite, UA: POSITIVE
Protein, UA: POSITIVE — AB
Spec Grav, UA: 1.02 (ref 1.010–1.025)
Urobilinogen, UA: 0.2 E.U./dL
pH, UA: 5 (ref 5.0–8.0)

## 2022-04-09 MED ORDER — TRAMADOL HCL 50 MG PO TABS: 50.0000 mg | ORAL_TABLET | Freq: Four times a day (QID) | ORAL | 0 refills | Status: DC | PRN

## 2022-04-09 MED ORDER — CIPROFLOXACIN HCL 500 MG PO TABS: 500.0000 mg | ORAL_TABLET | Freq: Two times a day (BID) | ORAL | 0 refills | Status: DC

## 2022-04-09 MED ORDER — CIPROFLOXACIN HCL 500 MG PO TABS: 500.0000 mg | ORAL_TABLET | Freq: Two times a day (BID) | ORAL | 0 refills | Status: AC

## 2022-04-09 MED ORDER — CEFTRIAXONE SODIUM 1 G IJ SOLR
1.0000 g | Freq: Once | INTRAMUSCULAR | Status: AC
  Administered 2022-04-09: 1 g via INTRAMUSCULAR

## 2022-04-09 NOTE — Progress Notes (Signed)
Assessment: 1. Ureteral calculus   2. Nephrolithiasis   3. Abnormal urine findings   4. History of UTI     Plan: I personally reviewed the KUB study from today which shows bilateral ureteral stents in good position, multiple calcifications in the right renal shadow including the 7 x 7 mm calcification in the area of the right renal pelvis. Urine culture sent today. Rocephin 1 g IM given today. Will change antibiotics to Cipro 500 mg BID x 7 days Will postpone stent removal today due to possible UTI. Refill of tramadol provided. Samples of Myrbetriq 25 mg daily provided. Recommend AZO for stent symptoms. Will call to arrange next visit for stent removal and to arrange for right ESL  Chief Complaint: Chief Complaint  Patient presents with   Nephrolithiasis    HPI: Amber Bowman is a 67 y.o. female who presents for continued evaluation of bilateral ureteral calculi. She presented on 04/01/22 with a 3-week history of left back pain with associated frequency, urgency, and dysuria. Her urinalysis was suspicious for a UTI on 03/23/2022. Urine culture grew >100 K E. coli. She received Rocephin and Cipro on 03/25/2022. She completed the Cipro on 03/31/22. She was evaluated with CT imaging on 03/31/2022 which showed a 4 mm calculus in the left distal ureter with associated obstruction and an 8 mm calculus in the right renal pelvis near the right UPJ without significant obstruction. Treatment options were discussed with the patient. She underwent cystoscopy, bilateral retrograde pyelograms, left ureteroscopy, stone manipulation, and insertion of bilateral ureteral stents. A staged procedure for the right proximal calculus with stent placement  followed by shockwave lithotripsy in the near future was recommended for the proximal right ureteral calculus.  She returns today for follow-up.  She continues on her cefdinir.  She is complaining of low back pain as well as discomfort with voiding.  No fevers  or chills.  No gross hematuria.  No flank pain.  She is not taking Pyridium on a regular basis.   Portions of the above documentation were copied from a prior visit for review purposes only.  Allergies: No Known Allergies  PMH: Past Medical History:  Diagnosis Date   Anemia    Cancer ([place])    left breast   Cardiomyopathy ([place]) 06/08/2019   CHF (congestive heart failure) ([place]) 07/2019   per denies this dx   Colon polyps 2014   Condyloma    Depression    Epilepsy ([place])    last seizure reported was 9//2023   GERD (gastroesophageal reflux disease)    Osteopenia 04/2018   T score -1.8 FRAX 9.5% / 1.1% overall stable from prior DEXA   Seizures ([place])    last on 11/2021,  "few seizures per month " per husband   Thrombocytopenia ([place])     PSH: Past Surgical History:  Procedure Laterality Date   BREAST LUMPECTOMY WITH RADIOACTIVE SEED AND SENTINEL LYMPH NODE BIOPSY Left 12/08/2021   Procedure: LEFT BREAST LUMPECTOMY WITH RADIOACTIVE SEED AND SENTINEL LYMPH NODE BIOPSY;  Surgeon: Jovita Kussmaul, MD;  Location: [place];  Service: General;  Laterality: Left;   CATARACT EXTRACTION Bilateral 03/10/2015   CYSTOSCOPY W/ RETROGRADES Right 06/25/2021   Procedure: CYSTOSCOPY WITH RIGHT RETROGRADE PYELOGRAM, REMOVAL OF BLADDER STONE;  Surgeon: Raynelle Bring, MD;  [facility];  Service: Urology;  Laterality: Right;   CYSTOSCOPY WITH RETROGRADE PYELOGRAM, URETEROSCOPY AND STENT PLACEMENT Bilateral 04/01/2022   Procedure: CYSTOSCOPY WITH BILATERAL RETROGRADE PYELOGRAM, LEFT URETEROSCOPY LASER STONE MANIPulation  AND BILATERAL STENT PLACEMENT;  Surgeon: Primus Bravo., MD;  [facility];  Service: Urology;  Laterality: Bilateral;   CYSTOSCOPY/URETEROSCOPY/HOLMIUM LASER/STENT PLACEMENT Left 09/04/2019   Procedure: CYSTOSCOPY/RETROGRADE/ URETEROSCOPY/HOLMIUM LASER/STENT PLACEMENT/ REMOVAL OF NEPHROSTOMY TUBE;  Surgeon: Raynelle Bring, MD;  [facility];  Service: Urology;  Laterality:  Left;   IR NEPHROSTOMY EXCHANGE LEFT  08/17/2019   IR NEPHROSTOMY PLACEMENT LEFT  06/28/2019    SH: Social History   Tobacco Use   Smoking status: Never   Smokeless tobacco: Never  Vaping Use   Vaping Use: Never used  Substance Use Topics   Alcohol use: No    Alcohol/week: 0.0 standard drinks of alcohol   Drug use: No    ROS: Constitutional:  Negative for fever, chills, weight loss CV: Negative for chest pain, previous MI, hypertension Respiratory:  Negative for shortness of breath, wheezing, sleep apnea, frequent cough GI:  Negative for nausea, vomiting, bloody stool, GERD  PE: BP 118/77   Pulse (!) 121   Ht '5\' 3"'$  (1.6 m)   Wt 153 lb (69.4 kg)   LMP 02/11/2008   BMI 27.10 kg/m  GENERAL APPEARANCE:  Well appearing, well developed, well nourished, NAD HEENT:  Atraumatic, normocephalic, oropharynx clear NECK:  Supple without lymphadenopathy or thyromegaly ABDOMEN:  Soft, non-tender, no masses EXTREMITIES:  Moves all extremities well, without clubbing, cyanosis, or edema NEUROLOGIC:  Alert and oriented x 3, normal gait, CN II-XII grossly intact MENTAL STATUS:  appropriate BACK:  Non-tender to palpation, No CVAT SKIN:  Warm, dry, and intact   Results: U/A dipstick:  3+ blood, 3+ protein, + nitrite, 3+ LE

## 2022-04-09 NOTE — Progress Notes (Signed)
IM Injection  Patient is present today for an IM Injection for treatment of UTI. Drug: Ceftriaxone Dose:1g Location:Right upper outer buttocks Lot: [id number] Exp:10/2023 Patient tolerated well, no complications were noted  Performed by: Bradly Bienenstock CMA

## 2022-04-11 LAB — URINE CULTURE

## 2022-04-13 ENCOUNTER — Telehealth: Payer: Self-pay

## 2022-04-13 NOTE — Telephone Encounter (Signed)
Notified pt as advised. Made appt for 04/14/22, pt verbalized understanding.

## 2022-04-13 NOTE — Telephone Encounter (Signed)
-----   Message from Primus Bravo, MD sent at 04/13/2022 12:31 PM EST ----- Please notify patient that her urine culture did not show evidence of a UTI. Please schedule her to come to the office tomorrow afternoon to have her stent removed.  She has a tether. OK to schedule at 2 PM.

## 2022-04-14 ENCOUNTER — Ambulatory Visit (INDEPENDENT_AMBULATORY_CARE_PROVIDER_SITE_OTHER): Payer: Medicare HMO | Admitting: Urology

## 2022-04-14 ENCOUNTER — Other Ambulatory Visit: Payer: Self-pay | Admitting: Urology

## 2022-04-14 ENCOUNTER — Encounter: Payer: Self-pay | Admitting: Urology

## 2022-04-14 VITALS — BP 111/78 | HR 134 | Ht 63.0 in | Wt 153.0 lb

## 2022-04-14 DIAGNOSIS — N200 Calculus of kidney: Secondary | ICD-10-CM

## 2022-04-14 DIAGNOSIS — Z87440 Personal history of urinary (tract) infections: Secondary | ICD-10-CM

## 2022-04-14 DIAGNOSIS — N201 Calculus of ureter: Secondary | ICD-10-CM | POA: Diagnosis not present

## 2022-04-14 LAB — URINALYSIS
Glucose, UA: NEGATIVE mg/dL
Nitrite, UA: POSITIVE — AB
Protein Ur, POC: >=300 mg/dL — AB
Spec Grav, UA: 1.025 (ref 1.010–1.025)
Urobilinogen, UA: 2 E.U./dL — AB
pH, UA: 5.5 (ref 5.0–8.0)

## 2022-04-14 MED ORDER — TRAMADOL HCL 50 MG PO TABS: 50.0000 mg | ORAL_TABLET | Freq: Four times a day (QID) | ORAL | 0 refills | Status: DC | PRN

## 2022-04-14 NOTE — Progress Notes (Unsigned)
Surgical Physician Order Form [facility]  * Scheduling expectation :  04/20/22  *Length of Case: 60 minutes  *MD Preforming Case: Michaelle Birks, MD  *Assistant Needed: no  *Facility Preference:  [facility]  *Clearance needed: no  *Anticoagulation Instructions: N/A  *Aspirin Instructions: N/A  -Admit type: OUTpatient  -Anesthesia: Local  -Use Standing Orders: ESWL  *Diagnosis: Right UPJ Stone  *Procedure: right  ESL  Additional orders: N/A  -Equipment:  none -VTE Prophylaxis Standing Order SCD's       Other:   -Standing Lab Orders Per Anesthesia    Lab other: KUB day of Procedure  -Standing Test orders EKG/Chest x-ray per Anesthesia       Test other:   - Medications:   Cipro 500 mg PO x 1  -Other orders:  N/A  *Post-op visit Date/Instructions:  MD Follow up - 1 week with KUB

## 2022-04-14 NOTE — Progress Notes (Signed)
 Assessment: 1. Nephrolithiasis   2. Ureteral calculus   3. History of UTI     Plan: Left ureteral stent removed today via tether Samples of Myrbetriq 50 mg daily provided. Recommend AZO for stent related symptoms. Refill of Tramadol provided for prn use  Procedure: The patient will be scheduled for right ESL at [place].  Surgical request is placed with the surgery schedulers and will be scheduled at the patient's/family request. Informed consent is given as documented below. Anesthesia:  local  The patient does not have sleep apnea, history of MRSA, history of VRE, history of cardiac device requiring special anesthetic needs. Patient is stable and considered clear for surgical in an outpatient ambulatory surgery setting as well as patient hospital setting.  Consent for Operation or Procedure: Provider Certification I hereby certify that the nature, purpose, benefits, usual and most frequent risks of, and alternatives to, the operation or procedure have been explained to the patient (or person authorized to sign for the patient) either by me as responsible physician or by the provider who is to perform the operation or procedure. Time spent such that the patient/family has had an opportunity to ask questions, and that those questions have been answered. The patient or the patient's representative has been advised that selected tasks may be performed by assistants to the primary health care provider(s). I believe that the patient (or person authorized to sign for the patient) understands what has been explained, and has consented to the operation or procedure. No guarantees were implied or made.  Chief Complaint: Chief Complaint  Patient presents with   Nephrolithiasis    HPI: Amber Bowman is a 66 y.o. female who presents for continued evaluation of bilateral ureteral calculi. She presented on 04/01/22 with a 3-week history of left back pain with associated frequency, urgency,  and dysuria. Her urinalysis was suspicious for a UTI on 03/23/2022. Urine culture grew >100 K E. coli. She received Rocephin and Cipro on 03/25/2022. She completed the Cipro on 03/31/22. She was evaluated with CT imaging on 03/31/2022 which showed a 4 mm calculus in the left distal ureter with associated obstruction and an 8 mm calculus in the right renal pelvis near the right UPJ without significant obstruction. Treatment options were discussed with the patient. She underwent cystoscopy, bilateral retrograde pyelograms, left ureteroscopy, stone manipulation, and insertion of bilateral ureteral stents. A staged procedure for the right proximal calculus with stent placement  followed by shockwave lithotripsy in the near future was recommended for the proximal right ureteral calculus. At her visit on 04/09/2022, she continued on her cefdinir.  She reported low back pain as well as discomfort with voiding.  No fevers or chills.  No gross hematuria.  No flank pain.  She was not taking Pyridium on a regular basis. Her urinalysis was nitrite positive.  Planned stent removal was postponed due to a possible UTI.  She was given 1 g of Rocephin IM and was changed to Cipro twice daily x 7 days. Urine culture grew <10K colonies.  She returns today for left ureteral stent removal.  She continues to have urinary frequency and urgency.  She also has gross hematuria.  She continues on Cipro. She has pain in the pelvic area.  No flank pain.  No fever or chills.   Portions of the above documentation were copied from a prior visit for review purposes only.  Allergies: No Known Allergies  PMH: Past Medical History:  Diagnosis Date   Anemia      Cancer ([facility])    left breast   Cardiomyopathy ([facility]) 06/08/2019   CHF (congestive heart failure) ([facility]) 07/2019   per denies this dx   Colon polyps 2014   Condyloma    Depression    Epilepsy ([facility])    last seizure reported was 9//2023   GERD (gastroesophageal reflux disease)     Osteopenia 04/2018   T score -1.8 FRAX 9.5% / 1.1% overall stable from prior DEXA   Seizures ([facility])    last on 11/2021,  "few seizures per month " per husband   Thrombocytopenia ([facility])     PSH: Past Surgical History:  Procedure Laterality Date   BREAST LUMPECTOMY WITH RADIOACTIVE SEED AND SENTINEL LYMPH NODE BIOPSY Left 12/08/2021   Procedure: LEFT BREAST LUMPECTOMY WITH RADIOACTIVE SEED AND SENTINEL LYMPH NODE BIOPSY;  Surgeon: Toth, Paul III, MD;  [facility];  Service: General;  Laterality: Left;   CATARACT EXTRACTION Bilateral 03/10/2015   CYSTOSCOPY W/ RETROGRADES Right 06/25/2021   Procedure: CYSTOSCOPY WITH RIGHT RETROGRADE PYELOGRAM, REMOVAL OF BLADDER STONE;  Surgeon: Borden, Lester, MD;  [facility];  Service: Urology;  Laterality: Right;   CYSTOSCOPY WITH RETROGRADE PYELOGRAM, URETEROSCOPY AND STENT PLACEMENT Bilateral 04/01/2022   Procedure: CYSTOSCOPY WITH BILATERAL RETROGRADE PYELOGRAM, LEFT URETEROSCOPY LASER STONE MANIPulation AND BILATERAL STENT PLACEMENT;  Surgeon: Eyva Califano J., MD;  [facility];  Service: Urology;  Laterality: Bilateral;   CYSTOSCOPY/URETEROSCOPY/HOLMIUM LASER/STENT PLACEMENT Left 09/04/2019   Procedure: CYSTOSCOPY/RETROGRADE/ URETEROSCOPY/HOLMIUM LASER/STENT PLACEMENT/ REMOVAL OF NEPHROSTOMY TUBE;  Surgeon: Borden, Lester, MD;  [facility];  Service: Urology;  Laterality: Left;   IR NEPHROSTOMY EXCHANGE LEFT  08/17/2019   IR NEPHROSTOMY PLACEMENT LEFT  06/28/2019    SH: Social History   Tobacco Use   Smoking status: Never   Smokeless tobacco: Never  Vaping Use   Vaping Use: Never used  Substance Use Topics   Alcohol use: No    Alcohol/week: 0.0 standard drinks of alcohol   Drug use: No    ROS: Constitutional:  Negative for fever, chills, weight loss CV: Negative for chest pain, previous MI, hypertension Respiratory:  Negative for shortness of breath, wheezing, sleep apnea, frequent cough GI:  Negative for nausea,  vomiting, bloody stool, GERD  PE: BP 111/78   Pulse (!) 134   Ht 5' 3" (1.6 m)   Wt 153 lb (69.4 kg)   LMP 02/11/2008   BMI 27.10 kg/m  GENERAL APPEARANCE:  Well appearing, well developed, well nourished, NAD HEENT:  Atraumatic, normocephalic, oropharynx clear NECK:  Supple without lymphadenopathy or thyromegaly ABDOMEN:  Soft, non-tender, no masses EXTREMITIES:  Moves all extremities well, without clubbing, cyanosis, or edema NEUROLOGIC:  Alert and oriented x 3, normal gait, CN II-XII grossly intact MENTAL STATUS:  appropriate BACK:  Non-tender to palpation, No CVAT SKIN:  Warm, dry, and intact   Results: U/A dipstick: 3+ bilirubin, 1+ ketones, 3+ blood, + nitrite, 3+ LE 

## 2022-04-14 NOTE — H&P (View-Only) (Signed)
Assessment: 1. Nephrolithiasis   2. Ureteral calculus   3. History of UTI     Plan: Left ureteral stent removed today via tether Samples of Myrbetriq 50 mg daily provided. Recommend AZO for stent related symptoms. Refill of Tramadol provided for prn use  Procedure: The patient will be scheduled for right ESL at [facility]  Surgical request is placed with the surgery schedulers and will be scheduled at the patient's/family request. Informed consent is given as documented below. Anesthesia:  local  The patient does not have sleep apnea, history of MRSA, history of VRE, history of cardiac device requiring special anesthetic needs. Patient is stable and considered clear for surgical in an outpatient ambulatory surgery setting as well as patient hospital setting.  Consent for Operation or Procedure: Provider Certification I hereby certify that the nature, purpose, benefits, usual and most frequent risks of, and alternatives to, the operation or procedure have been explained to the patient (or person authorized to sign for the patient) either by me as responsible physician or by the provider who is to perform the operation or procedure. Time spent such that the patient/family has had an opportunity to ask questions, and that those questions have been answered. The patient or the patient's representative has been advised that selected tasks may be performed by assistants to the primary health care provider(s). I believe that the patient (or person authorized to sign for the patient) understands what has been explained, and has consented to the operation or procedure. No guarantees were implied or made.  Chief Complaint: Chief Complaint  Patient presents with   Nephrolithiasis    HPI: Amber Bowman is a 67 y.o. female who presents for continued evaluation of bilateral ureteral calculi. She presented on 04/01/22 with a 3-week history of left back pain with associated frequency, urgency,  and dysuria. Her urinalysis was suspicious for a UTI on 03/23/2022. Urine culture grew >100 K E. coli. She received Rocephin and Cipro on 03/25/2022. She completed the Cipro on 03/31/22. She was evaluated with CT imaging on 03/31/2022 which showed a 4 mm calculus in the left distal ureter with associated obstruction and an 8 mm calculus in the right renal pelvis near the right UPJ without significant obstruction. Treatment options were discussed with the patient. She underwent cystoscopy, bilateral retrograde pyelograms, left ureteroscopy, stone manipulation, and insertion of bilateral ureteral stents. A staged procedure for the right proximal calculus with stent placement  followed by shockwave lithotripsy in the near future was recommended for the proximal right ureteral calculus. At her visit on 04/09/2022, she continued on her cefdinir.  She reported low back pain as well as discomfort with voiding.  No fevers or chills.  No gross hematuria.  No flank pain.  She was not taking Pyridium on a regular basis. Her urinalysis was nitrite positive.  Planned stent removal was postponed due to a possible UTI.  She was given 1 g of Rocephin IM and was changed to Cipro twice daily x 7 days. Urine culture grew <10K colonies.  She returns today for left ureteral stent removal.  She continues to have urinary frequency and urgency.  She also has gross hematuria.  She continues on Cipro. She has pain in the pelvic area.  No flank pain.  No fever or chills.   Portions of the above documentation were copied from a prior visit for review purposes only.  Allergies: No Known Allergies  PMH: Past Medical History:  Diagnosis Date   Anemia  Cancer [facility])    left breast   Cardiomyopathy ([place]) 06/08/2019   CHF (congestive heart failure) ([place]) 07/2019   per denies this dx   Colon polyps 2014   Condyloma    Depression    Epilepsy ([place])    last seizure reported was 9//2023   GERD (gastroesophageal reflux disease)     Osteopenia 04/2018   T score -1.8 FRAX 9.5% / 1.1% overall stable from prior DEXA   Seizures ([place])    last on 11/2021,  "few seizures per month " per husband   Thrombocytopenia ([place])     PSH: Past Surgical History:  Procedure Laterality Date   BREAST LUMPECTOMY WITH RADIOACTIVE SEED AND SENTINEL LYMPH NODE BIOPSY Left 12/08/2021   Procedure: LEFT BREAST LUMPECTOMY WITH RADIOACTIVE SEED AND SENTINEL LYMPH NODE BIOPSY;  Surgeon: Jovita Kussmaul, MD;  Location: [place];  Service: General;  Laterality: Left;   CATARACT EXTRACTION Bilateral 03/10/2015   CYSTOSCOPY W/ RETROGRADES Right 06/25/2021   Procedure: CYSTOSCOPY WITH RIGHT RETROGRADE PYELOGRAM, REMOVAL OF BLADDER STONE;  Surgeon: Raynelle Bring, MD;  [facility];  Service: Urology;  Laterality: Right;   CYSTOSCOPY WITH RETROGRADE PYELOGRAM, URETEROSCOPY AND STENT PLACEMENT Bilateral 04/01/2022   Procedure: CYSTOSCOPY WITH BILATERAL RETROGRADE PYELOGRAM, LEFT URETEROSCOPY LASER STONE MANIPulation AND BILATERAL STENT PLACEMENT;  Surgeon: Primus Bravo., MD;  [facility];  Service: Urology;  Laterality: Bilateral;   CYSTOSCOPY/URETEROSCOPY/HOLMIUM LASER/STENT PLACEMENT Left 09/04/2019   Procedure: CYSTOSCOPY/RETROGRADE/ URETEROSCOPY/HOLMIUM LASER/STENT PLACEMENT/ REMOVAL OF NEPHROSTOMY TUBE;  Surgeon: Raynelle Bring, MD;  [facility];  Service: Urology;  Laterality: Left;   IR NEPHROSTOMY EXCHANGE LEFT  08/17/2019   IR NEPHROSTOMY PLACEMENT LEFT  06/28/2019    SH: Social History   Tobacco Use   Smoking status: Never   Smokeless tobacco: Never  Vaping Use   Vaping Use: Never used  Substance Use Topics   Alcohol use: No    Alcohol/week: 0.0 standard drinks of alcohol   Drug use: No    ROS: Constitutional:  Negative for fever, chills, weight loss CV: Negative for chest pain, previous MI, hypertension Respiratory:  Negative for shortness of breath, wheezing, sleep apnea, frequent cough GI:  Negative for nausea,  vomiting, bloody stool, GERD  PE: BP 111/78   Pulse (!) 134   Ht 5' 3"$  (1.6 m)   Wt 153 lb (69.4 kg)   LMP 02/11/2008   BMI 27.10 kg/m  GENERAL APPEARANCE:  Well appearing, well developed, well nourished, NAD HEENT:  Atraumatic, normocephalic, oropharynx clear NECK:  Supple without lymphadenopathy or thyromegaly ABDOMEN:  Soft, non-tender, no masses EXTREMITIES:  Moves all extremities well, without clubbing, cyanosis, or edema NEUROLOGIC:  Alert and oriented x 3, normal gait, CN II-XII grossly intact MENTAL STATUS:  appropriate BACK:  Non-tender to palpation, No CVAT SKIN:  Warm, dry, and intact   Results: U/A dipstick: 3+ bilirubin, 1+ ketones, 3+ blood, + nitrite, 3+ LE

## 2022-04-15 ENCOUNTER — Encounter (HOSPITAL_BASED_OUTPATIENT_CLINIC_OR_DEPARTMENT_OTHER): Payer: Self-pay | Admitting: Urology

## 2022-04-15 NOTE — Progress Notes (Signed)
I spoke with Amber Bowman. We have discussed possible surgery dates and 04/20/2022 was agreed upon by all parties. Patient given information about surgery date, what to expect pre-operatively and post operatively.    We discussed that a pre-op nurse will be calling to set up the pre-op visit that will take place prior to surgery. Informed patient that our office will communicate any additional care to be provided after surgery.    Patients questions or concerns were discussed during our call. Advised to call our office should there be any additional information, questions or concerns that arise. Patient verbalized understanding.    Patient was given ESWL instructions in office by [facility].

## 2022-04-15 NOTE — Progress Notes (Signed)
Post op appt scheduled. KUB order placed- patient aware of KUB prior to post op appt.

## 2022-04-16 ENCOUNTER — Other Ambulatory Visit: Payer: Self-pay | Admitting: Urology

## 2022-04-20 ENCOUNTER — Encounter (HOSPITAL_BASED_OUTPATIENT_CLINIC_OR_DEPARTMENT_OTHER)
Admission: RE | Disposition: A | Discharge status: Home / Self Care | Payer: Self-pay | Source: Home / Self Care | Attending: Urology

## 2022-04-20 ENCOUNTER — Encounter (HOSPITAL_BASED_OUTPATIENT_CLINIC_OR_DEPARTMENT_OTHER): Payer: Self-pay | Admitting: Urology

## 2022-04-20 ENCOUNTER — Ambulatory Visit (HOSPITAL_BASED_OUTPATIENT_CLINIC_OR_DEPARTMENT_OTHER)
Admission: RE | Admit: 2022-04-20 | Discharge: 2022-04-20 | Disposition: A | Discharge status: Home / Self Care | Payer: Medicare HMO | Attending: Urology | Admitting: Urology

## 2022-04-20 ENCOUNTER — Other Ambulatory Visit: Payer: Self-pay | Admitting: Urology

## 2022-04-20 ENCOUNTER — Ambulatory Visit (HOSPITAL_COMMUNITY): Payer: Medicare HMO

## 2022-04-20 ENCOUNTER — Other Ambulatory Visit: Payer: Self-pay

## 2022-04-20 ENCOUNTER — Telehealth: Payer: Self-pay | Admitting: Urology

## 2022-04-20 DIAGNOSIS — N135 Crossing vessel and stricture of ureter without hydronephrosis: Secondary | ICD-10-CM

## 2022-04-20 DIAGNOSIS — K8020 Calculus of gallbladder without cholecystitis without obstruction: Secondary | ICD-10-CM | POA: Diagnosis not present

## 2022-04-20 DIAGNOSIS — G40909 Epilepsy, unspecified, not intractable, without status epilepticus: Secondary | ICD-10-CM | POA: Insufficient documentation

## 2022-04-20 DIAGNOSIS — N200 Calculus of kidney: Secondary | ICD-10-CM

## 2022-04-20 DIAGNOSIS — M419 Scoliosis, unspecified: Secondary | ICD-10-CM | POA: Diagnosis not present

## 2022-04-20 DIAGNOSIS — Z466 Encounter for fitting and adjustment of urinary device: Secondary | ICD-10-CM | POA: Diagnosis not present

## 2022-04-20 DIAGNOSIS — N201 Calculus of ureter: Secondary | ICD-10-CM

## 2022-04-20 HISTORY — PX: EXTRACORPOREAL SHOCK WAVE LITHOTRIPSY: SHX1557

## 2022-04-20 SURGERY — LITHOTRIPSY, ESWL
Anesthesia: LOCAL | Laterality: Right

## 2022-04-20 MED ORDER — DIPHENHYDRAMINE HCL 25 MG PO CAPS
ORAL_CAPSULE | ORAL | Status: AC
  Filled 2022-04-20: qty 1

## 2022-04-20 MED ORDER — SODIUM CHLORIDE 0.9 % IV SOLN: INTRAVENOUS | Status: DC

## 2022-04-20 MED ORDER — FELBAMATE 600 MG PO TABS: 300.0000 mg | ORAL_TABLET | Freq: Every day | ORAL | 11 refills | Status: AC

## 2022-04-20 MED ORDER — DIAZEPAM 5 MG PO TABS
5.0000 mg | ORAL_TABLET | Freq: Once | ORAL | Status: AC
  Administered 2022-04-20: 5 mg via ORAL

## 2022-04-20 MED ORDER — DIAZEPAM 5 MG PO TABS: 10.0000 mg | ORAL_TABLET | ORAL | Status: DC

## 2022-04-20 MED ORDER — DIPHENHYDRAMINE HCL 25 MG PO CAPS
25.0000 mg | ORAL_CAPSULE | ORAL | Status: AC
  Administered 2022-04-20: 25 mg via ORAL

## 2022-04-20 MED ORDER — TRAMADOL HCL 50 MG PO TABS: 50.0000 mg | ORAL_TABLET | Freq: Four times a day (QID) | ORAL | 0 refills | Status: DC | PRN

## 2022-04-20 MED ORDER — CIPROFLOXACIN HCL 500 MG PO TABS: 500.0000 mg | ORAL_TABLET | Freq: Two times a day (BID) | ORAL | 0 refills | Status: AC

## 2022-04-20 MED ORDER — DIAZEPAM 5 MG PO TABS
ORAL_TABLET | ORAL | Status: AC
  Filled 2022-04-20: qty 2

## 2022-04-20 MED ORDER — CIPROFLOXACIN HCL 500 MG PO TABS
ORAL_TABLET | ORAL | Status: AC
  Filled 2022-04-20: qty 1

## 2022-04-20 MED ORDER — CIPROFLOXACIN HCL 500 MG PO TABS
500.0000 mg | ORAL_TABLET | Freq: Once | ORAL | Status: AC
  Administered 2022-04-20: 500 mg via ORAL

## 2022-04-20 NOTE — Telephone Encounter (Signed)
Patient notified of new Rx.

## 2022-04-20 NOTE — Interval H&P Note (Signed)
History and Physical Interval Note:  04/20/2022 12:34 PM  Amber Bowman  has presented today for surgery, with the diagnosis of Right UPJ Stone.  The various methods of treatment have been discussed with the patient and family. After consideration of risks, benefits and other options for treatment, the patient has consented to  Procedure(s): EXTRACORPOREAL SHOCK WAVE LITHOTRIPSY (ESWL) (Right) as a surgical intervention.  The patient's history has been reviewed, patient examined, no change in status, stable for surgery.  I have reviewed the patient's chart and labs.  Questions were answered to the patient's satisfaction.     Michaelle Birks

## 2022-04-20 NOTE — Telephone Encounter (Signed)
Patient left [facility] Mail requesting a prescription for pain medication. Patient requested a call back. - lmr

## 2022-04-21 ENCOUNTER — Encounter (HOSPITAL_BASED_OUTPATIENT_CLINIC_OR_DEPARTMENT_OTHER): Payer: Self-pay | Admitting: Urology

## 2022-04-24 ENCOUNTER — Telehealth: Payer: Self-pay | Admitting: Urology

## 2022-04-24 ENCOUNTER — Other Ambulatory Visit: Payer: Self-pay | Admitting: Urology

## 2022-04-24 DIAGNOSIS — N200 Calculus of kidney: Secondary | ICD-10-CM

## 2022-04-24 DIAGNOSIS — N201 Calculus of ureter: Secondary | ICD-10-CM

## 2022-04-24 MED ORDER — TRAMADOL HCL 50 MG PO TABS: 50.0000 mg | ORAL_TABLET | Freq: Four times a day (QID) | ORAL | 0 refills | Status: DC | PRN

## 2022-04-24 NOTE — Telephone Encounter (Signed)
Patient called and requested a refill:  traMADol (ULTRAM) 50 MG tablet [id number]   Patient stated she does not have enough pain medicine to last until her appointment on 04/28/22.  -lmr.

## 2022-04-28 ENCOUNTER — Encounter: Payer: Self-pay | Admitting: Urology

## 2022-04-28 ENCOUNTER — Other Ambulatory Visit: Payer: Self-pay | Admitting: Urology

## 2022-04-28 ENCOUNTER — Ambulatory Visit (INDEPENDENT_AMBULATORY_CARE_PROVIDER_SITE_OTHER): Payer: Medicare HMO | Admitting: Urology

## 2022-04-28 ENCOUNTER — Ambulatory Visit (HOSPITAL_BASED_OUTPATIENT_CLINIC_OR_DEPARTMENT_OTHER)
Admission: RE | Admit: 2022-04-28 | Discharge: 2022-04-28 | Disposition: A | Discharge status: Home / Self Care | Payer: Medicare HMO | Source: Ambulatory Visit | Attending: Urology | Admitting: Urology

## 2022-04-28 VITALS — BP 111/74 | HR 103 | Ht 63.0 in | Wt 151.0 lb

## 2022-04-28 DIAGNOSIS — N202 Calculus of kidney with calculus of ureter: Secondary | ICD-10-CM | POA: Diagnosis not present

## 2022-04-28 DIAGNOSIS — N200 Calculus of kidney: Secondary | ICD-10-CM

## 2022-04-28 DIAGNOSIS — N201 Calculus of ureter: Secondary | ICD-10-CM | POA: Diagnosis not present

## 2022-04-28 DIAGNOSIS — Z466 Encounter for fitting and adjustment of urinary device: Secondary | ICD-10-CM | POA: Diagnosis not present

## 2022-04-28 DIAGNOSIS — Z87440 Personal history of urinary (tract) infections: Secondary | ICD-10-CM | POA: Diagnosis not present

## 2022-04-28 DIAGNOSIS — Z9889 Other specified postprocedural states: Secondary | ICD-10-CM | POA: Diagnosis not present

## 2022-04-28 NOTE — Progress Notes (Signed)
I spoke with Ms. Rittenour. We have discussed possible surgery dates and 05/04/2022 was agreed upon by all parties. Patient given information about surgery date, what to expect pre-operatively and post operatively.    We discussed that a pre-op nurse will be calling to set up the pre-op visit that will take place prior to surgery. Informed patient that our office will communicate any additional care to be provided after surgery.    Patients questions or concerns were discussed during our call. Advised to call our office should there be any additional information, questions or concerns that arise. Patient verbalized understanding.    Patient understands arrival time 11:45am at [facility]

## 2022-04-28 NOTE — H&P (View-Only) (Signed)
Assessment: 1. Ureteral calculus; right   2. Nephrolithiasis   3. History of UTI     Plan: I reviewed the KUB from today.  The study shows the right ureteral stent in good position.  There are numerous small calcifications along the stent in the mid to upper ureter.  The calcification previously noted in the right upper kidney is now in the proximal right ureter along the stent at the level of L3, measuring 5 x 7 mm in size. This is a new diagnosis as the right ureteral calculus was previously in the upper calyx.  Options for management of the right proximal ureteral calculus discussed including ureteral stent removal with possible spontaneous passage, repeat shockwave lithotripsy, and ureteroscopic stone manipulation with laser lithotripsy.  Risk and benefits of each modality discussed.  Following our discussion, she would like to proceed with repeat shockwave lithotripsy next week.  Procedure: The patient will be scheduled for right ESL at [facility].  Surgical request is placed with the surgery schedulers and will be scheduled at the patient's/family request. Informed consent is given as documented below. Anesthesia:  local  The patient does not have sleep apnea, history of MRSA, history of VRE, history of cardiac device requiring special anesthetic needs. Patient is stable and considered clear for surgical in an outpatient ambulatory surgery setting as well as patient hospital setting.  Consent for Operation or Procedure: Provider Certification I hereby certify that the nature, purpose, benefits, usual and most frequent risks of, and alternatives to, the operation or procedure have been explained to the patient (or person authorized to sign for the patient) either by me as responsible physician or by the provider who is to perform the operation or procedure. Time spent such that the patient/family has had an opportunity to ask questions, and that those questions have been answered. The  patient or the patient's representative has been advised that selected tasks may be performed by assistants to the primary health care provider(s). I believe that the patient (or person authorized to sign for the patient) understands what has been explained, and has consented to the operation or procedure. No guarantees were implied or made.  Chief Complaint: Chief Complaint  Patient presents with   Nephrolithiasis    HPI: Amber Bowman is a 67 y.o. female who presents for continued evaluation of bilateral ureteral calculi and nephrolithiasis. She presented on 04/01/22 with a 3-week history of left back pain with associated frequency, urgency, and dysuria. Her urinalysis was suspicious for a UTI on 03/23/2022. Urine culture grew >100 K E. coli. She received Rocephin and Cipro on 03/25/2022. She completed the Cipro on 03/31/22. She was evaluated with CT imaging on 03/31/2022 which showed a 4 mm calculus in the left distal ureter with associated obstruction and an 8 mm calculus in the right renal pelvis near the right UPJ without significant obstruction. Treatment options were discussed with the patient. She underwent cystoscopy, bilateral retrograde pyelograms, left ureteroscopy, stone manipulation, and insertion of bilateral ureteral stents. A staged procedure for the right proximal calculus with stent placement  followed by shockwave lithotripsy in the near future was recommended for the proximal right ureteral calculus. At her visit on 04/09/2022, she continued on her cefdinir.  She reported low back pain as well as discomfort with voiding.  No fevers or chills.  No gross hematuria.  No flank pain.  She was not taking Pyridium on a regular basis. Her urinalysis was nitrite positive.  Planned stent removal was postponed  due to a possible UTI.  She was given 1 g of Rocephin IM and was changed to Cipro twice daily x 7 days. Urine culture grew <10K colonies. Her left ureteral stent was removed on 04/14/22. She  underwent right ESL on 04/20/22.  She was in today for scheduled follow-up.  She has done fairly well since the lithotripsy procedure last week.  She has passed a number of small stone fragments.  She continues to have some stent related symptoms with frequency, urgency, and occasional gross hematuria.  She is currently on Cipro.  Portions of the above documentation were copied from a prior visit for review purposes only.  Allergies: No Known Allergies  PMH: Past Medical History:  Diagnosis Date   Anemia    Cancer ([place])    left breast   Cardiomyopathy ([place]) 06/08/2019   Pt denies   CHF (congestive heart failure) ([place]) 07/2019   per denies this dx   Colon polyps 2014   Condyloma    Depression    Epilepsy ([place])    last seizure reported was 9//2023   GERD (gastroesophageal reflux disease)    Osteopenia 04/2018   T score -1.8 FRAX 9.5% / 1.1% overall stable from prior DEXA.  Patient denies   Seizures ([place])    last on 11/2021,  "few seizures per month " per husband   Thrombocytopenia ([place])    Pt denies    PSH: Past Surgical History:  Procedure Laterality Date   BREAST LUMPECTOMY WITH RADIOACTIVE SEED AND SENTINEL LYMPH NODE BIOPSY Left 12/08/2021   Procedure: LEFT BREAST LUMPECTOMY WITH RADIOACTIVE SEED AND SENTINEL LYMPH NODE BIOPSY;  Surgeon: Jovita Kussmaul, MD;  Location: [place];  Service: General;  Laterality: Left;   CATARACT EXTRACTION Bilateral 03/10/2015   CYSTOSCOPY W/ RETROGRADES Right 06/25/2021   Procedure: CYSTOSCOPY WITH RIGHT RETROGRADE PYELOGRAM, REMOVAL OF BLADDER STONE;  Surgeon: Raynelle Bring, MD;  [facility];  Service: Urology;  Laterality: Right;   CYSTOSCOPY WITH RETROGRADE PYELOGRAM, URETEROSCOPY AND STENT PLACEMENT Bilateral 04/01/2022   Procedure: CYSTOSCOPY WITH BILATERAL RETROGRADE PYELOGRAM, LEFT URETEROSCOPY LASER STONE MANIPulation AND BILATERAL STENT PLACEMENT;  Surgeon: Primus Bravo., MD;  [facility];  Service: Urology;  Laterality:  Bilateral;   CYSTOSCOPY/URETEROSCOPY/HOLMIUM LASER/STENT PLACEMENT Left 09/04/2019   Procedure: CYSTOSCOPY/RETROGRADE/ URETEROSCOPY/HOLMIUM LASER/STENT PLACEMENT/ REMOVAL OF NEPHROSTOMY TUBE;  Surgeon: Raynelle Bring, MD;  [facility];  Service: Urology;  Laterality: Left;   EXTRACORPOREAL SHOCK WAVE LITHOTRIPSY Right 04/20/2022   Procedure: EXTRACORPOREAL SHOCK WAVE LITHOTRIPSY (ESWL);  Surgeon: Primus Bravo., MD;  Location: [facility];  Service: Urology;  Laterality: Right;   IR NEPHROSTOMY EXCHANGE LEFT  08/17/2019   IR NEPHROSTOMY PLACEMENT LEFT  06/28/2019    SH: Social History   Tobacco Use   Smoking status: Never   Smokeless tobacco: Never  Vaping Use   Vaping Use: Never used  Substance Use Topics   Alcohol use: No    Alcohol/week: 0.0 standard drinks of alcohol   Drug use: No    ROS: Constitutional:  Negative for fever, chills, weight loss CV: Negative for chest pain, previous MI, hypertension Respiratory:  Negative for shortness of breath, wheezing, sleep apnea, frequent cough GI:  Negative for nausea, vomiting, bloody stool, GERD  PE: BP 111/74   Pulse (!) 103   Ht '5\' 3"'$  (1.6 m)   Wt 151 lb (68.5 kg)   LMP 02/11/2008   BMI 26.75 kg/m  GENERAL APPEARANCE:  Well appearing, well developed, well nourished,  NAD HEENT:  Atraumatic, normocephalic, oropharynx clear NECK:  Supple without lymphadenopathy or thyromegaly ABDOMEN:  Soft, non-tender, no masses EXTREMITIES:  Moves all extremities well, without clubbing, cyanosis, or edema NEUROLOGIC:  Alert and oriented x 3, normal gait, CN II-XII grossly intact MENTAL STATUS:  appropriate BACK:  Non-tender to palpation, No CVAT SKIN:  Warm, dry, and intact   Results: U/A dipstick: 3+ blood, 2+ protein, 1+ LE  CYSTOSCOPY  Procedure: Flexible cystoscopy/Stent removal  Pre-Operative Diagnosis:   nephrolithiasis  Post-Operative Diagnosis:  nephrolithiasis  Anesthesia: local with lidocaine  gel  Surgical Narrative:  After appropriate informed consent was obtained, the patient was prepped and draped in the usual sterile fashion in the supine position. She was correctly identified and the proper procedure delineated prior to proceeding. Sterile lidocaine gel was instilled in the urethra.  The flexible cystoscope was introduced without difficulty. The right ureteral stent was identified and removed using stent graspers. She tolerated the procedure well.  A chaperone was present throughout the procedure.

## 2022-04-28 NOTE — Progress Notes (Signed)
Surgical Physician Order Form [facility]  * Scheduling expectation :  05/04/22  *Length of Case: 60 minutes  *MD Preforming Case:  Jonette Eva, MD  *Assistant Needed: no  *Facility Preference:  [facility]  *Clearance needed: no  *Anticoagulation Instructions: N/A  *Aspirin Instructions: N/A  -Admit type: OUTpatient  -Anesthesia: Local  -Use Standing Orders: ESWL  *Diagnosis: Right Ureteral Stone  *Procedure: right  ESL  Additional orders: N/A  -Equipment:  none -VTE Prophylaxis Standing Order SCD's       Other:   -Standing Lab Orders Per Anesthesia    Lab other: KUB day of Procedure  -Standing Test orders EKG/Chest x-ray per Anesthesia       Test other:   - Medications:  Cipro 500 mg PO  -Other orders:  N/A  *Post-op visit Date/Instructions:  MD Follow up in 3-4 days for KUB and possible stent removal

## 2022-04-28 NOTE — Progress Notes (Signed)
Opened in error

## 2022-04-28 NOTE — Addendum Note (Signed)
Addended by: Dorisann Frames on: 04/28/2022 02:09 PM   Modules accepted: Orders

## 2022-04-28 NOTE — Progress Notes (Addendum)
Assessment: 1. Ureteral calculus; right   2. Nephrolithiasis   3. History of UTI     Plan: I reviewed the KUB from today.  The study shows the right ureteral stent in good position.  There are numerous small calcifications along the stent in the mid to upper ureter.  The calcification previously noted in the right upper kidney is now in the proximal right ureter along the stent at the level of L3, measuring 5 x 7 mm in size. This is a new diagnosis as the right ureteral calculus was previously in the upper calyx.  Options for management of the right proximal ureteral calculus discussed including ureteral stent removal with possible spontaneous passage, repeat shockwave lithotripsy, and ureteroscopic stone manipulation with laser lithotripsy.  Risk and benefits of each modality discussed.  Following our discussion, she would like to proceed with repeat shockwave lithotripsy next week.  Procedure: The patient will be scheduled for right ESL at [facility].  Surgical request is placed with the surgery schedulers and will be scheduled at the patient's/family request. Informed consent is given as documented below. Anesthesia:  local  The patient does not have sleep apnea, history of MRSA, history of VRE, history of cardiac device requiring special anesthetic needs. Patient is stable and considered clear for surgical in an outpatient ambulatory surgery setting as well as patient hospital setting.  Consent for Operation or Procedure: Provider Certification I hereby certify that the nature, purpose, benefits, usual and most frequent risks of, and alternatives to, the operation or procedure have been explained to the patient (or person authorized to sign for the patient) either by me as responsible physician or by the provider who is to perform the operation or procedure. Time spent such that the patient/family has had an opportunity to ask questions, and that those questions have been answered. The  patient or the patient's representative has been advised that selected tasks may be performed by assistants to the primary health care provider(s). I believe that the patient (or person authorized to sign for the patient) understands what has been explained, and has consented to the operation or procedure. No guarantees were implied or made.  Chief Complaint: Chief Complaint  Patient presents with   Nephrolithiasis    HPI: Amber Bowman is a 67 y.o. female who presents for continued evaluation of bilateral ureteral calculi and nephrolithiasis. She presented on 04/01/22 with a 3-week history of left back pain with associated frequency, urgency, and dysuria. Her urinalysis was suspicious for a UTI on 03/23/2022. Urine culture grew >100 K E. coli. She received Rocephin and Cipro on 03/25/2022. She completed the Cipro on 03/31/22. She was evaluated with CT imaging on 03/31/2022 which showed a 4 mm calculus in the left distal ureter with associated obstruction and an 8 mm calculus in the right renal pelvis near the right UPJ without significant obstruction. Treatment options were discussed with the patient. She underwent cystoscopy, bilateral retrograde pyelograms, left ureteroscopy, stone manipulation, and insertion of bilateral ureteral stents. A staged procedure for the right proximal calculus with stent placement  followed by shockwave lithotripsy in the near future was recommended for the proximal right ureteral calculus. At her visit on 04/09/2022, she continued on her cefdinir.  She reported low back pain as well as discomfort with voiding.  No fevers or chills.  No gross hematuria.  No flank pain.  She was not taking Pyridium on a regular basis. Her urinalysis was nitrite positive.  Planned stent removal was postponed  due to a possible UTI.  She was given 1 g of Rocephin IM and was changed to Cipro twice daily x 7 days. Urine culture grew <10K colonies. Her left ureteral stent was removed on 04/14/22. She  underwent right ESL on 04/20/22.  She was in today for scheduled follow-up.  She has done fairly well since the lithotripsy procedure last week.  She has passed a number of small stone fragments.  She continues to have some stent related symptoms with frequency, urgency, and occasional gross hematuria.  She is currently on Cipro.  Portions of the above documentation were copied from a prior visit for review purposes only.  Allergies: No Known Allergies  PMH: Past Medical History:  Diagnosis Date   Anemia    Cancer ([place])    left breast   Cardiomyopathy ([place]) 06/08/2019   Pt denies   CHF (congestive heart failure) ([place]) 07/2019   per denies this dx   Colon polyps 2014   Condyloma    Depression    Epilepsy ([place])    last seizure reported was 9//2023   GERD (gastroesophageal reflux disease)    Osteopenia 04/2018   T score -1.8 FRAX 9.5% / 1.1% overall stable from prior DEXA.  Patient denies   Seizures ([place])    last on 11/2021,  "few seizures per month " per husband   Thrombocytopenia ([place])    Pt denies    PSH: Past Surgical History:  Procedure Laterality Date   BREAST LUMPECTOMY WITH RADIOACTIVE SEED AND SENTINEL LYMPH NODE BIOPSY Left 12/08/2021   Procedure: LEFT BREAST LUMPECTOMY WITH RADIOACTIVE SEED AND SENTINEL LYMPH NODE BIOPSY;  Surgeon: Jovita Kussmaul, MD;  Location: [place];  Service: General;  Laterality: Left;   CATARACT EXTRACTION Bilateral 03/10/2015   CYSTOSCOPY W/ RETROGRADES Right 06/25/2021   Procedure: CYSTOSCOPY WITH RIGHT RETROGRADE PYELOGRAM, REMOVAL OF BLADDER STONE;  Surgeon: Raynelle Bring, MD;  [facility];  Service: Urology;  Laterality: Right;   CYSTOSCOPY WITH RETROGRADE PYELOGRAM, URETEROSCOPY AND STENT PLACEMENT Bilateral 04/01/2022   Procedure: CYSTOSCOPY WITH BILATERAL RETROGRADE PYELOGRAM, LEFT URETEROSCOPY LASER STONE MANIPulation AND BILATERAL STENT PLACEMENT;  Surgeon: Primus Bravo., MD;  [facility];  Service: Urology;  Laterality:  Bilateral;   CYSTOSCOPY/URETEROSCOPY/HOLMIUM LASER/STENT PLACEMENT Left 09/04/2019   Procedure: CYSTOSCOPY/RETROGRADE/ URETEROSCOPY/HOLMIUM LASER/STENT PLACEMENT/ REMOVAL OF NEPHROSTOMY TUBE;  Surgeon: Raynelle Bring, MD;  [facility];  Service: Urology;  Laterality: Left;   EXTRACORPOREAL SHOCK WAVE LITHOTRIPSY Right 04/20/2022   Procedure: EXTRACORPOREAL SHOCK WAVE LITHOTRIPSY (ESWL);  Surgeon: Primus Bravo., MD;  Location: [facility];  Service: Urology;  Laterality: Right;   IR NEPHROSTOMY EXCHANGE LEFT  08/17/2019   IR NEPHROSTOMY PLACEMENT LEFT  06/28/2019    SH: Social History   Tobacco Use   Smoking status: Never   Smokeless tobacco: Never  Vaping Use   Vaping Use: Never used  Substance Use Topics   Alcohol use: No    Alcohol/week: 0.0 standard drinks of alcohol   Drug use: No    ROS: Constitutional:  Negative for fever, chills, weight loss CV: Negative for chest pain, previous MI, hypertension Respiratory:  Negative for shortness of breath, wheezing, sleep apnea, frequent cough GI:  Negative for nausea, vomiting, bloody stool, GERD  PE: BP 111/74   Pulse (!) 103   Ht '5\' 3"'$  (1.6 m)   Wt 151 lb (68.5 kg)   LMP 02/11/2008   BMI 26.75 kg/m  GENERAL APPEARANCE:  Well appearing, well developed, well nourished,  NAD HEENT:  Atraumatic, normocephalic, oropharynx clear NECK:  Supple without lymphadenopathy or thyromegaly ABDOMEN:  Soft, non-tender, no masses EXTREMITIES:  Moves all extremities well, without clubbing, cyanosis, or edema NEUROLOGIC:  Alert and oriented x 3, normal gait, CN II-XII grossly intact MENTAL STATUS:  appropriate BACK:  Non-tender to palpation, No CVAT SKIN:  Warm, dry, and intact   Results: U/A dipstick: 3+ blood, 2+ protein, 1+ LE

## 2022-04-29 LAB — URINALYSIS
Bilirubin, UA: NEGATIVE
Glucose, UA: NEGATIVE mg/dL
Ketones, UA: NEGATIVE
Nitrite, UA: NEGATIVE
Protein, UA: POSITIVE — AB
Spec Grav, UA: >=1.03 — AB (ref 1.010–1.025)
Urobilinogen, UA: 0.2 E.U./dL
pH, UA: 5.5 (ref 5.0–8.0)

## 2022-04-29 NOTE — Progress Notes (Signed)
Talked with patient. Hx and meds reviewed. Will take am meds with sip of water on Monday. Clear liquids until 0745. Instructions given,. Arrival time 1145. Husband is the driver. To bring in blue folder

## 2022-04-30 ENCOUNTER — Other Ambulatory Visit: Payer: Self-pay | Admitting: Urology

## 2022-04-30 ENCOUNTER — Telehealth: Payer: Self-pay | Admitting: Urology

## 2022-04-30 DIAGNOSIS — N200 Calculus of kidney: Secondary | ICD-10-CM

## 2022-04-30 DIAGNOSIS — N201 Calculus of ureter: Secondary | ICD-10-CM

## 2022-04-30 MED ORDER — TRAMADOL HCL 50 MG PO TABS: 50.0000 mg | ORAL_TABLET | Freq: Four times a day (QID) | ORAL | 0 refills | Status: DC | PRN

## 2022-04-30 NOTE — Telephone Encounter (Signed)
Patient called and stated she wants a refill on:  traMADol (ULTRAM) 50 MG tablet [id number]   Patient stated she REALLY NEEDS this sent to pharmacy asap. - lmr.

## 2022-05-04 ENCOUNTER — Encounter (HOSPITAL_BASED_OUTPATIENT_CLINIC_OR_DEPARTMENT_OTHER): Payer: Self-pay | Admitting: Urology

## 2022-05-04 ENCOUNTER — Encounter (HOSPITAL_BASED_OUTPATIENT_CLINIC_OR_DEPARTMENT_OTHER)
Admission: RE | Disposition: A | Discharge status: Home / Self Care | Payer: Self-pay | Source: Home / Self Care | Attending: Urology

## 2022-05-04 ENCOUNTER — Ambulatory Visit (HOSPITAL_BASED_OUTPATIENT_CLINIC_OR_DEPARTMENT_OTHER)
Admission: RE | Admit: 2022-05-04 | Discharge: 2022-05-04 | Disposition: A | Discharge status: Home / Self Care | Payer: Medicare HMO | Attending: Urology | Admitting: Urology

## 2022-05-04 ENCOUNTER — Other Ambulatory Visit: Payer: Self-pay

## 2022-05-04 ENCOUNTER — Ambulatory Visit (HOSPITAL_COMMUNITY): Payer: Medicare HMO

## 2022-05-04 DIAGNOSIS — R310 Gross hematuria: Secondary | ICD-10-CM | POA: Diagnosis not present

## 2022-05-04 DIAGNOSIS — Z09 Encounter for follow-up examination after completed treatment for conditions other than malignant neoplasm: Secondary | ICD-10-CM | POA: Insufficient documentation

## 2022-05-04 DIAGNOSIS — N202 Calculus of kidney with calculus of ureter: Secondary | ICD-10-CM | POA: Diagnosis present

## 2022-05-04 DIAGNOSIS — Z87442 Personal history of urinary calculi: Secondary | ICD-10-CM | POA: Insufficient documentation

## 2022-05-04 DIAGNOSIS — N201 Calculus of ureter: Secondary | ICD-10-CM

## 2022-05-04 HISTORY — PX: EXTRACORPOREAL SHOCK WAVE LITHOTRIPSY: SHX1557

## 2022-05-04 SURGERY — LITHOTRIPSY, ESWL
Anesthesia: LOCAL | Laterality: Right

## 2022-05-04 MED ORDER — SODIUM CHLORIDE 0.9 % IV SOLN
INTRAVENOUS | Status: DC
  Administered 2022-05-04: 1000 mL via INTRAVENOUS

## 2022-05-04 MED ORDER — DIAZEPAM 5 MG PO TABS
10.0000 mg | ORAL_TABLET | ORAL | Status: AC
  Administered 2022-05-04: 10 mg via ORAL

## 2022-05-04 MED ORDER — DIPHENHYDRAMINE HCL 25 MG PO CAPS
ORAL_CAPSULE | ORAL | Status: AC
  Filled 2022-05-04: qty 1

## 2022-05-04 MED ORDER — DIPHENHYDRAMINE HCL 25 MG PO CAPS
25.0000 mg | ORAL_CAPSULE | ORAL | Status: AC
  Administered 2022-05-04: 25 mg via ORAL

## 2022-05-04 MED ORDER — CIPROFLOXACIN HCL 500 MG PO TABS
500.0000 mg | ORAL_TABLET | ORAL | Status: AC
  Administered 2022-05-04: 500 mg via ORAL

## 2022-05-04 MED ORDER — CIPROFLOXACIN HCL 500 MG PO TABS
ORAL_TABLET | ORAL | Status: AC
  Filled 2022-05-04: qty 1

## 2022-05-04 MED ORDER — DIAZEPAM 5 MG PO TABS
ORAL_TABLET | ORAL | Status: AC
  Filled 2022-05-04: qty 2

## 2022-05-04 NOTE — Discharge Instructions (Signed)
  Post Anesthesia Home Care Instructions  Activity: Get plenty of rest for the remainder of the day. A responsible individual must stay with you for 24 hours following the procedure.  For the next 24 hours, DO NOT: -Drive a car -Paediatric nurse -Drink alcoholic beverages -Take any medication unless instructed by your physician -Make any legal decisions or sign important papers.  Meals: Start with liquid foods such as gelatin or soup. Progress to regular foods as tolerated. Avoid greasy, spicy, heavy foods. If nausea and/or vomiting occur, drink only clear liquids until the nausea and/or vomiting subsides. Call your physician if vomiting continues.

## 2022-05-05 ENCOUNTER — Encounter (HOSPITAL_BASED_OUTPATIENT_CLINIC_OR_DEPARTMENT_OTHER): Payer: Self-pay | Admitting: Urology

## 2022-05-05 NOTE — Interval H&P Note (Signed)
History and Physical Interval Note:  05/05/2022 11:42 AM  Amber Bowman  has presented today for surgery, with the diagnosis of right ureteral stone.  The various methods of treatment have been discussed with the patient and family. After consideration of risks, benefits and other options for treatment, the patient has consented to  Procedure(s): EXTRACORPOREAL SHOCK WAVE LITHOTRIPSY (ESWL) (Right) as a surgical intervention.  The patient's history has been reviewed, patient examined, no change in status, stable for surgery.  I have reviewed the patient's chart and labs.  Questions were answered to the patient's satisfaction.     Pamala Hurry

## 2022-05-06 LAB — CALCULI, WITH PHOTOGRAPH (CLINICAL LAB)
Calcium Oxalate Monohydrate: 20 %
Hydroxyapatite: 80 %
Weight Calculi: 25 mg

## 2022-05-07 ENCOUNTER — Encounter: Payer: Self-pay | Admitting: Urology

## 2022-05-07 ENCOUNTER — Ambulatory Visit (INDEPENDENT_AMBULATORY_CARE_PROVIDER_SITE_OTHER): Payer: Medicare HMO | Admitting: Urology

## 2022-05-07 ENCOUNTER — Ambulatory Visit (HOSPITAL_BASED_OUTPATIENT_CLINIC_OR_DEPARTMENT_OTHER)
Admission: RE | Admit: 2022-05-07 | Discharge: 2022-05-07 | Disposition: A | Discharge status: Home / Self Care | Payer: Medicare HMO | Source: Ambulatory Visit | Attending: Urology | Admitting: Urology

## 2022-05-07 VITALS — BP 114/79 | HR 93 | Ht 63.0 in | Wt 150.0 lb

## 2022-05-07 DIAGNOSIS — N201 Calculus of ureter: Secondary | ICD-10-CM

## 2022-05-07 DIAGNOSIS — N200 Calculus of kidney: Secondary | ICD-10-CM

## 2022-05-07 DIAGNOSIS — Z87440 Personal history of urinary (tract) infections: Secondary | ICD-10-CM | POA: Diagnosis not present

## 2022-05-07 LAB — URINALYSIS
Bilirubin, UA: NEGATIVE
Glucose, UA: NEGATIVE mg/dL
Ketones, POC UA: NEGATIVE mg/dL
Nitrite, UA: NEGATIVE
Protein Ur, POC: 100 mg/dL — AB
Spec Grav, UA: 1.02 (ref 1.010–1.025)
Urobilinogen, UA: 0.2 E.U./dL
pH, UA: 6.5 (ref 5.0–8.0)

## 2022-05-07 MED ORDER — TRAMADOL HCL 50 MG PO TABS: 50.0000 mg | ORAL_TABLET | Freq: Four times a day (QID) | ORAL | 0 refills | Status: DC | PRN

## 2022-05-07 MED ORDER — CIPROFLOXACIN HCL 500 MG PO TABS
500.0000 mg | ORAL_TABLET | Freq: Once | ORAL | Status: AC
  Administered 2022-05-07: 500 mg via ORAL

## 2022-05-07 NOTE — Addendum Note (Signed)
Addended by: Evelina Bucy on: 05/07/2022 11:58 AM   Modules accepted: Orders

## 2022-05-07 NOTE — Progress Notes (Signed)
Assessment: 1. Ureteral calculus   2. Nephrolithiasis   3. History of UTI    Plan: I personally viewed the KUB study from this morning the right ureteral stent in good position.  A 4-5 mm calcification is seen at the proximal aspect of the stent consistent with the previously treated proximal ureteral calculus.  The previously noted calcifications along the course of the stent are not currently seen.  It does appear that the proximal ureteral calculus is changed in configuration consistent with fragmentation. Right ureteral stent removed today. Cipro x 1 following procedure Return to office in 7-10 days with KUB.  Chief Complaint: Chief Complaint  Patient presents with   Nephrolithiasis   Cysto    HPI: Amber Bowman is a 67 y.o. female who presents for continued evaluation of bilateral ureteral calculi and nephrolithiasis. She presented on 04/01/22 with a 3-week history of left back pain with associated frequency, urgency, and dysuria. Her urinalysis was suspicious for a UTI on 03/23/2022. Urine culture grew >100 K E. coli. She received Rocephin and Cipro on 03/25/2022. She completed the Cipro on 03/31/22. She was evaluated with CT imaging on 03/31/2022 which showed a 4 mm calculus in the left distal ureter with associated obstruction and an 8 mm calculus in the right renal pelvis near the right UPJ without significant obstruction. Treatment options were discussed with the patient. She underwent cystoscopy, bilateral retrograde pyelograms, left ureteroscopy, stone manipulation, and insertion of bilateral ureteral stents on 04/01/22. A staged procedure for the right proximal calculus with stent placement  followed by shockwave lithotripsy in the near future was recommended for the proximal right ureteral calculus. At her visit on 04/09/2022, she continued on her cefdinir.  She reported low back pain as well as discomfort with voiding.  No fevers or chills.  No gross hematuria.  No flank pain.  She  was not taking Pyridium on a regular basis. Her urinalysis was nitrite positive.  Planned stent removal was postponed due to a possible UTI.  She was given 1 g of Rocephin IM and was changed to Cipro twice daily x 7 days. Urine culture grew <10K colonies. Her left ureteral stent was removed on 04/14/22. She underwent right ESL on 04/20/22. KUB from 04/28/2022 showed a calcification measuring approximately 6 mm along the proximal aspect of the right ureteral stent as well as multiple fragments along the mid to distal portion of the stent.  Treatment options for the proximal ureteral calculus discussed. She underwent right ESL on 05/04/22 for a proximal right ureteral calculus.  She presents today for cystoscopy and stent removal.  She has done well since the lithotripsy procedure.  She is having some stent related symptoms.  She is not on any antibiotics at the present time.  No dysuria or gross hematuria.  No fevers or chills.  Stone analysis: 20% calcium oxalate monohydrate, 80% calcium phosphate  Portions of the above documentation were copied from a prior visit for review purposes only.  Allergies: No Known Allergies  PMH: Past Medical History:  Diagnosis Date   Anemia    Cancer ([place])    left breast   Cardiomyopathy ([place]) 06/08/2019   Pt denies   CHF (congestive heart failure) ([place]) 07/2019   per denies this dx   Colon polyps 2014   Condyloma    Depression    Epilepsy ([place])    last seizure reported was 9//2023   GERD (gastroesophageal reflux disease)    Osteopenia 04/2018   T score -  1.8 FRAX 9.5% / 1.1% overall stable from prior DEXA.  Patient denies   Seizures ([place])    last on 11/2021,  "few seizures per month " per husband   Thrombocytopenia ([place])    Pt denies    PSH: Past Surgical History:  Procedure Laterality Date   BREAST LUMPECTOMY WITH RADIOACTIVE SEED AND SENTINEL LYMPH NODE BIOPSY Left 12/08/2021   Procedure: LEFT BREAST LUMPECTOMY WITH RADIOACTIVE SEED AND SENTINEL  LYMPH NODE BIOPSY;  Surgeon: Jovita Kussmaul, MD;  Location: [place];  Service: General;  Laterality: Left;   CATARACT EXTRACTION Bilateral 03/10/2015   CYSTOSCOPY W/ RETROGRADES Right 06/25/2021   Procedure: CYSTOSCOPY WITH RIGHT RETROGRADE PYELOGRAM, REMOVAL OF BLADDER STONE;  Surgeon: Raynelle Bring, MD;  [facility];  Service: Urology;  Laterality: Right;   CYSTOSCOPY WITH RETROGRADE PYELOGRAM, URETEROSCOPY AND STENT PLACEMENT Bilateral 04/01/2022   Procedure: CYSTOSCOPY WITH BILATERAL RETROGRADE PYELOGRAM, LEFT URETEROSCOPY LASER STONE MANIPulation AND BILATERAL STENT PLACEMENT;  Surgeon: Primus Bravo., MD;  [facility];  Service: Urology;  Laterality: Bilateral;   CYSTOSCOPY/URETEROSCOPY/HOLMIUM LASER/STENT PLACEMENT Left 09/04/2019   Procedure: CYSTOSCOPY/RETROGRADE/ URETEROSCOPY/HOLMIUM LASER/STENT PLACEMENT/ REMOVAL OF NEPHROSTOMY TUBE;  Surgeon: Raynelle Bring, MD;  [facility];  Service: Urology;  Laterality: Left;   EXTRACORPOREAL SHOCK WAVE LITHOTRIPSY Right 04/20/2022   Procedure: EXTRACORPOREAL SHOCK WAVE LITHOTRIPSY (ESWL);  Surgeon: Primus Bravo., MD;  Location: [facility];  Service: Urology;  Laterality: Right;   EXTRACORPOREAL SHOCK WAVE LITHOTRIPSY Right 05/04/2022   Procedure: EXTRACORPOREAL SHOCK WAVE LITHOTRIPSY (ESWL);  Surgeon: Pamala Hurry, MD;  [facility]: [facility];  Service: Urology;  Laterality: Right;   IR NEPHROSTOMY EXCHANGE LEFT  08/17/2019   IR NEPHROSTOMY PLACEMENT LEFT  06/28/2019    SH: Social History   Tobacco Use   Smoking status: Never   Smokeless tobacco: Never  Vaping Use   Vaping Use: Never used  Substance Use Topics   Alcohol use: No    Alcohol/week: 0.0 standard drinks of alcohol   Drug use: No    ROS: Constitutional:  Negative for fever, chills, weight loss CV: Negative for chest pain, previous MI, hypertension Respiratory:  Negative for shortness of breath, wheezing, sleep  apnea, frequent cough GI:  Negative for nausea, vomiting, bloody stool, GERD  PE: BP 114/79   Pulse 93   Ht '5\' 3"'$  (1.6 m)   Wt 150 lb (68 kg)   LMP 02/11/2008   BMI 26.57 kg/m  GENERAL APPEARANCE:  Well appearing, well developed, well nourished, NAD HEENT:  Atraumatic, normocephalic, oropharynx clear NECK:  Supple without lymphadenopathy or thyromegaly ABDOMEN:  Soft, non-tender, no masses EXTREMITIES:  Moves all extremities well, without clubbing, cyanosis, or edema NEUROLOGIC:  Alert and oriented x 3, normal gait, CN II-XII grossly intact MENTAL STATUS:  appropriate BACK:  Non-tender to palpation, No CVAT SKIN:  Warm, dry, and intact   Results: U/A dipstick: 3+ blood, 3+ LE  CYSTOSCOPY/STENT REMOVAL  Procedure: Flexible cystoscopy/Stent removal  Pre-Operative Diagnosis:  ureteral calculus  Post-Operative Diagnosis: ureteral calculus  Anesthesia: local with lidocaine gel  Surgical Narrative:  After appropriate informed consent was obtained, the patient was prepped and draped in the usual sterile fashion in the supine position. She was correctly identified and the proper procedure delineated prior to proceeding. Sterile lidocaine gel was instilled in the urethra.  The flexible cystoscope was introduced without difficulty. The right ureteral stent was identified and removed using stent graspers. She tolerated the procedure well.  A chaperone  was present throughout the procedure.

## 2022-05-19 ENCOUNTER — Ambulatory Visit (HOSPITAL_BASED_OUTPATIENT_CLINIC_OR_DEPARTMENT_OTHER)
Admission: RE | Admit: 2022-05-19 | Discharge: 2022-05-19 | Disposition: A | Discharge status: Home / Self Care | Payer: Medicare HMO | Source: Ambulatory Visit | Attending: Urology | Admitting: Urology

## 2022-05-19 ENCOUNTER — Ambulatory Visit (INDEPENDENT_AMBULATORY_CARE_PROVIDER_SITE_OTHER): Payer: Medicare HMO | Admitting: Urology

## 2022-05-19 ENCOUNTER — Encounter: Payer: Self-pay | Admitting: Urology

## 2022-05-19 VITALS — BP 153/80 | HR 125 | Ht 62.0 in | Wt 148.0 lb

## 2022-05-19 DIAGNOSIS — Z87442 Personal history of urinary calculi: Secondary | ICD-10-CM

## 2022-05-19 DIAGNOSIS — N200 Calculus of kidney: Secondary | ICD-10-CM

## 2022-05-19 DIAGNOSIS — Z87440 Personal history of urinary (tract) infections: Secondary | ICD-10-CM

## 2022-05-19 DIAGNOSIS — N201 Calculus of ureter: Secondary | ICD-10-CM | POA: Insufficient documentation

## 2022-05-19 LAB — URINALYSIS
Bilirubin, UA: NEGATIVE
Glucose, UA: NEGATIVE mg/dL
Ketones, POC UA: NEGATIVE mg/dL
Leukocytes, UA: NEGATIVE
Nitrite, UA: NEGATIVE
Protein Ur, POC: 30 mg/dL — AB
Spec Grav, UA: 1.025 (ref 1.010–1.025)
Urobilinogen, UA: 0.2 E.U./dL
pH, UA: 5.5 (ref 5.0–8.0)

## 2022-05-19 NOTE — Progress Notes (Signed)
Assessment: 1. Ureteral calculus   2. Nephrolithiasis   3. History of UTI     Plan: I personally viewed the KUB study from today.  The previously noted calcifications seen along the course of the right ureter are not visualized on the study today. Stone prevention discussed 24 hour urine for metabolic evaluation given history of nephrolithiasis Renal U/S in 4-6 weeks Return to office in 3 months  Chief Complaint: Chief Complaint  Patient presents with   Nephrolithiasis    HPI: Amber Bowman is a 67 y.o. female who presents for continued evaluation of bilateral ureteral calculi and nephrolithiasis. She presented on 04/01/22 with a 3-week history of left back pain with associated frequency, urgency, and dysuria. Her urinalysis was suspicious for a UTI on 03/23/2022. Urine culture grew >100 K E. coli. She received Rocephin and Cipro on 03/25/2022. She completed the Cipro on 03/31/22. She was evaluated with CT imaging on 03/31/2022 which showed a 4 mm calculus in the left distal ureter with associated obstruction and an 8 mm calculus in the right renal pelvis near the right UPJ without significant obstruction. Treatment options were discussed with the patient. She underwent cystoscopy, bilateral retrograde pyelograms, left ureteroscopy, stone manipulation, and insertion of bilateral ureteral stents on 04/01/22. A staged procedure for the right proximal calculus with stent placement  followed by shockwave lithotripsy in the near future was recommended for the proximal right ureteral calculus. At her visit on 04/09/2022, she continued on her cefdinir.  She reported low back pain as well as discomfort with voiding.  No fevers or chills.  No gross hematuria.  No flank pain.  She was not taking Pyridium on a regular basis. Her urinalysis was nitrite positive.  Planned stent removal was postponed due to a possible UTI.  She was given 1 g of Rocephin IM and was changed to Cipro twice daily x 7 days. Urine  culture grew <10K colonies. Her left ureteral stent was removed on 04/14/22. She underwent right ESL on 04/20/22. KUB from 04/28/2022 showed a calcification measuring approximately 6 mm along the proximal aspect of the right ureteral stent as well as multiple fragments along the mid to distal portion of the stent.  Treatment options for the proximal ureteral calculus discussed. She underwent right ESL on 05/04/22 for a proximal right ureteral calculus. She has done well since the lithotripsy procedure.   Her stent was removed on 05/07/22.  Stone analysis: 20% calcium oxalate monohydrate, 80% calcium phosphate  She returns today for follow-up.  She is doing very well following the stent removal.  She has passed a number of stone fragments without difficulty.  She is not having any flank pain.  She is not having any lower urinary tract symptoms. Portions of the above documentation were copied from a prior visit for review purposes only.  Allergies: No Known Allergies  PMH: Past Medical History:  Diagnosis Date   Anemia    Cancer ([place])    left breast   Cardiomyopathy ([place]) 06/08/2019   Pt denies   CHF (congestive heart failure) ([place]) 07/2019   per denies this dx   Colon polyps 2014   Condyloma    Depression    Epilepsy ([place])    last seizure reported was 9//2023   GERD (gastroesophageal reflux disease)    Osteopenia 04/2018   T score -1.8 FRAX 9.5% / 1.1% overall stable from prior DEXA.  Patient denies   Seizures ([place])    last on 11/2021,  "few  seizures per month " per husband   Thrombocytopenia ([place])    Pt denies    PSH: Past Surgical History:  Procedure Laterality Date   BREAST LUMPECTOMY WITH RADIOACTIVE SEED AND SENTINEL LYMPH NODE BIOPSY Left 12/08/2021   Procedure: LEFT BREAST LUMPECTOMY WITH RADIOACTIVE SEED AND SENTINEL LYMPH NODE BIOPSY;  Surgeon: Jovita Kussmaul, MD;  Location: [place];  Service: General;  Laterality: Left;   CATARACT EXTRACTION Bilateral 03/10/2015    CYSTOSCOPY W/ RETROGRADES Right 06/25/2021   Procedure: CYSTOSCOPY WITH RIGHT RETROGRADE PYELOGRAM, REMOVAL OF BLADDER STONE;  Surgeon: Raynelle Bring, MD;  [facility];  Service: Urology;  Laterality: Right;   CYSTOSCOPY WITH RETROGRADE PYELOGRAM, URETEROSCOPY AND STENT PLACEMENT Bilateral 04/01/2022   Procedure: CYSTOSCOPY WITH BILATERAL RETROGRADE PYELOGRAM, LEFT URETEROSCOPY LASER STONE MANIPulation AND BILATERAL STENT PLACEMENT;  Surgeon: Primus Bravo., MD;  [facility];  Service: Urology;  Laterality: Bilateral;   CYSTOSCOPY/URETEROSCOPY/HOLMIUM LASER/STENT PLACEMENT Left 09/04/2019   Procedure: CYSTOSCOPY/RETROGRADE/ URETEROSCOPY/HOLMIUM LASER/STENT PLACEMENT/ REMOVAL OF NEPHROSTOMY TUBE;  Surgeon: Raynelle Bring, MD;  [facility];  Service: Urology;  Laterality: Left;   EXTRACORPOREAL SHOCK WAVE LITHOTRIPSY Right 04/20/2022   Procedure: EXTRACORPOREAL SHOCK WAVE LITHOTRIPSY (ESWL);  Surgeon: Primus Bravo., MD;  Location: [facility];  Service: Urology;  Laterality: Right;   EXTRACORPOREAL SHOCK WAVE LITHOTRIPSY Right 05/04/2022   Procedure: EXTRACORPOREAL SHOCK WAVE LITHOTRIPSY (ESWL);  Surgeon: Pamala Hurry, MD;  Location: [facility];  Service: Urology;  Laterality: Right;   IR NEPHROSTOMY EXCHANGE LEFT  08/17/2019   IR NEPHROSTOMY PLACEMENT LEFT  06/28/2019    SH: Social History   Tobacco Use   Smoking status: Never   Smokeless tobacco: Never  Vaping Use   Vaping Use: Never used  Substance Use Topics   Alcohol use: No    Alcohol/week: 0.0 standard drinks of alcohol   Drug use: No    ROS: Constitutional:  Negative for fever, chills, weight loss CV: Negative for chest pain, previous MI, hypertension Respiratory:  Negative for shortness of breath, wheezing, sleep apnea, frequent cough GI:  Negative for nausea, vomiting, bloody stool, GERD  PE: BP (!) 153/80   Pulse (!) 125   Ht '5\' 2"'$  (1.575 m)   Wt 148 lb  (67.1 kg)   LMP 02/11/2008   BMI 27.07 kg/m  GENERAL APPEARANCE:  Well appearing, well developed, well nourished, NAD HEENT:  Atraumatic, normocephalic, oropharynx clear NECK:  Supple without lymphadenopathy or thyromegaly ABDOMEN:  Soft, non-tender, no masses EXTREMITIES:  Moves all extremities well, without clubbing, cyanosis, or edema NEUROLOGIC:  Alert and oriented x 3, normal gait, CN II-XII grossly intact MENTAL STATUS:  appropriate BACK:  Non-tender to palpation, No CVAT SKIN:  Warm, dry, and intact   Results: U/A: trace blood, 1+ protein

## 2022-05-21 ENCOUNTER — Encounter: Payer: Self-pay | Admitting: Adult Health

## 2022-05-21 ENCOUNTER — Inpatient Hospital Stay: Payer: Medicare HMO | Attending: Hematology and Oncology | Admitting: Adult Health

## 2022-05-21 ENCOUNTER — Other Ambulatory Visit: Payer: Self-pay

## 2022-05-21 ENCOUNTER — Telehealth: Payer: Self-pay | Admitting: Hematology and Oncology

## 2022-05-21 VITALS — BP 125/70 | HR 102 | Temp 97.5°F | Resp 18 | Wt 152.2 lb

## 2022-05-21 DIAGNOSIS — Z170 Estrogen receptor positive status [ER+]: Secondary | ICD-10-CM | POA: Insufficient documentation

## 2022-05-21 DIAGNOSIS — Z79811 Long term (current) use of aromatase inhibitors: Secondary | ICD-10-CM | POA: Insufficient documentation

## 2022-05-21 DIAGNOSIS — C50512 Malignant neoplasm of lower-outer quadrant of left female breast: Secondary | ICD-10-CM | POA: Diagnosis not present

## 2022-05-21 MED ORDER — KETOCONAZOLE 2 % EX CREA: 1.0000 | TOPICAL_CREAM | Freq: Every day | CUTANEOUS | 0 refills | Status: DC

## 2022-05-21 MED ORDER — ANASTROZOLE 1 MG PO TABS: 1.0000 mg | ORAL_TABLET | Freq: Every day | ORAL | 3 refills | Status: DC

## 2022-05-21 NOTE — Progress Notes (Signed)
SURVIVORSHI VISIT:   BRIEF ONCOLOGIC HISTORY:  Oncology History  Malignant neoplasm of lower-outer quadrant of left breast of female, estrogen receptor positive ([place])  11/20/2021 Initial Diagnosis   Screening mammogram detected left breast distortion.  Ultrasound revealed 2 areas of abnormality both of which were biopsied and were found to be benign fibroadenoma and fibrocystic change.  Stereotactic biopsy of the distortion revealed grade 1 IDC with DCIS ER 98%, PR 0%, Ki-67 10%, HER2 negative   11/26/2021 Cancer Staging   Staging form: Breast, AJCC 8th Edition - Clinical stage from 11/26/2021: Stage IA (cT1c, cN0, cM0, G1, ER+, PR-, HER2-) - Signed by Nicholas Lose, MD on 11/26/2021 Stage prefix: Initial diagnosis Histologic grading system: 3 grade system   12/08/2021 Surgery   Left lumpectomy: Grade 1 IDC 0.9 cm, intermediate grade DCIS, margins negative, lymphovascular invasion present, 1/6 lymph nodes positive with nodal extension, ER 98%, PR 0%, HER2 negative 1+, Ki-67 10%      12/08/2021 Oncotype testing   Oncotype DX testing: Score 22 (ROR 18%) no chemotherapy    01/12/2022 - 02/23/2022 Radiation Therapy   Plan Name: Breast_L Site: Breast, Left Technique: 3D Mode: Photon Dose Per Fraction: 2 Gy Prescribed Dose (Delivered / Prescribed): 50 Gy / 50 Gy Prescribed Fxs (Delivered / Prescribed): 25 / 25   Plan Name: Breast_L_SCV Site: Breast, Left Technique: 3D Mode: Photon Dose Per Fraction: 2 Gy Prescribed Dose (Delivered / Prescribed): 50 Gy / 50 Gy Prescribed Fxs (Delivered / Prescribed): 25 / 25   Plan Name: Breast_L_Bst Site: Breast, Left Technique: 3D Mode: Photon Dose Per Fraction: 2 Gy Prescribed Dose (Delivered / Prescribed): 10 Gy / 10 Gy Prescribed Fxs (Delivered / Prescribed): 5 / 5   02/2022 -  Anti-estrogen oral therapy   Anastrozole x 7 years     INTERVAL HISTORY:  Amber Bowman to review her survivorship care plan detailing her treatment course for breast  cancer, as well as monitoring long-term side effects of that treatment, education regarding health maintenance, screening, and overall wellness and health promotion.     Overall, Amber Bowman reports feeling quite well. She is taking anastrozole daily and tolerates it well.    REVIEW OF SYSTEMS:  Review of Systems  Constitutional:  Negative for appetite change, chills, fatigue, fever and unexpected weight change.  HENT:   Negative for hearing loss, lump/mass and trouble swallowing.   Eyes:  Negative for eye problems and icterus.  Respiratory:  Negative for chest tightness, cough and shortness of breath.   Cardiovascular:  Negative for chest pain, leg swelling and palpitations.  Gastrointestinal:  Negative for abdominal distention, abdominal pain, constipation, diarrhea, nausea and vomiting.  Endocrine: Negative for hot flashes.  Genitourinary:  Negative for difficulty urinating.   Musculoskeletal:  Negative for arthralgias.  Skin:  Negative for itching and rash.  Neurological:  Negative for dizziness, extremity weakness, headaches and numbness.  Hematological:  Negative for adenopathy. Does not bruise/bleed easily.  Psychiatric/Behavioral:  Negative for depression. The patient is not nervous/anxious.   Breast: Denies any new nodularity, masses, tenderness, nipple changes, or nipple discharge.     PAST MEDICAL/SURGICAL HISTORY:  Past Medical History:  Diagnosis Date   Anemia    Cancer ([place])    left breast   Cardiomyopathy ([place]) 06/08/2019   Pt denies   CHF (congestive heart failure) ([place]) 07/2019   per denies this dx   Colon polyps 2014   Condyloma    Depression    Epilepsy ([place])    last  seizure reported was 9//2023   GERD (gastroesophageal reflux disease)    Osteopenia 04/2018   T score -1.8 FRAX 9.5% / 1.1% overall stable from prior DEXA.  Patient denies   Seizures ([place])    last on 11/2021,  "few seizures per month " per husband   Thrombocytopenia ([place])    Pt denies   Past  Surgical History:  Procedure Laterality Date   BREAST LUMPECTOMY WITH RADIOACTIVE SEED AND SENTINEL LYMPH NODE BIOPSY Left 12/08/2021   Procedure: LEFT BREAST LUMPECTOMY WITH RADIOACTIVE SEED AND SENTINEL LYMPH NODE BIOPSY;  Surgeon: Jovita Kussmaul, MD;  Location: [facility];  Service: General;  Laterality: Left;   CATARACT EXTRACTION Bilateral 03/10/2015   CYSTOSCOPY W/ RETROGRADES Right 06/25/2021   Procedure: CYSTOSCOPY WITH RIGHT RETROGRADE PYELOGRAM, REMOVAL OF BLADDER STONE;  Surgeon: Raynelle Bring, MD;  Location: [facility];  Service: Urology;  Laterality: Right;   CYSTOSCOPY WITH RETROGRADE PYELOGRAM, URETEROSCOPY AND STENT PLACEMENT Bilateral 04/01/2022   Procedure: CYSTOSCOPY WITH BILATERAL RETROGRADE PYELOGRAM, LEFT URETEROSCOPY LASER STONE MANIPulation AND BILATERAL STENT PLACEMENT;  Surgeon: Primus Bravo., MD;  Location: [facility];  Service: Urology;  Laterality: Bilateral;   CYSTOSCOPY/URETEROSCOPY/HOLMIUM LASER/STENT PLACEMENT Left 09/04/2019   Procedure: CYSTOSCOPY/RETROGRADE/ URETEROSCOPY/HOLMIUM LASER/STENT PLACEMENT/ REMOVAL OF NEPHROSTOMY TUBE;  Surgeon: Raynelle Bring, MD;  Location: [facility];  Service: Urology;  Laterality: Left;   EXTRACORPOREAL SHOCK WAVE LITHOTRIPSY Right 04/20/2022   Procedure: EXTRACORPOREAL SHOCK WAVE LITHOTRIPSY (ESWL);  Surgeon: Primus Bravo., MD;  [facility];  Service: Urology;  Laterality: Right;   EXTRACORPOREAL SHOCK WAVE LITHOTRIPSY Right 05/04/2022   Procedure: EXTRACORPOREAL SHOCK WAVE LITHOTRIPSY (ESWL);  Surgeon: Pamala Hurry, MD;  [facility]: [facility];  Service: Urology;  Laterality: Right;   IR NEPHROSTOMY EXCHANGE LEFT  08/17/2019   IR NEPHROSTOMY PLACEMENT LEFT  06/28/2019     ALLERGIES:  No Known Allergies   CURRENT MEDICATIONS:  Outpatient Encounter Medications as of 05/21/2022  Medication Sig   anastrozole (ARIMIDEX) 1 MG tablet Take 1 tablet (1 mg total) by mouth daily.    atorvastatin (LIPITOR) 10 MG tablet Take 10 mg by mouth at bedtime.   felbamate (FELBATOL) 600 MG tablet Take 0.5 tablets (300 mg total) by mouth at bedtime.   ketoconazole (NIZORAL) 2 % cream Apply 1 Application topically daily.   levETIRAcetam (KEPPRA) 500 MG tablet TAKE 1 TABLET BY MOUTH TWICE A DAY (Patient taking differently: Take 750 mg by mouth in the morning and at bedtime.)   NONFORMULARY OR COMPOUNDED ITEM Apply 1 application  topically See admin instructions. Compounded cream (Amitriptyline -Gabapentin-Lidocaine Cream 5/10/5%)- Apply to the lips 4 times a day as needed/as directed   omeprazole (PRILOSEC) 40 MG capsule Take 40 mg by mouth daily before breakfast.   Propylene Glycol, PF, (SYSTANE COMPLETE PF) 0.6 % SOLN Place 1 drop into both eyes daily as needed (for dryness).   [DISCONTINUED] traMADol (ULTRAM) 50 MG tablet Take 1 tablet (50 mg total) by mouth every 6 (six) hours as needed (for pain). (Patient not taking: Reported on 05/21/2022)   No facility-administered encounter medications on file as of 05/21/2022.     ONCOLOGIC FAMILY HISTORY:  Family History  Problem Relation Age of Onset   Hypertension Mother    Diabetes Mother    Heart disease Mother    Hypertension Father    Heart disease Father    Diabetes Sister    Hypertension Sister    Heart disease Sister    Skin cancer Paternal  Uncle    Seizures Neg Hx      SOCIAL HISTORY:  Social History   Socioeconomic History   Marital status: Legally Separated    Spouse name: Not on file   Number of children: 0   Years of education: 13   Highest education level: Not on file  Occupational History    Comment: Disabled.  Tobacco Use   Smoking status: Never   Smokeless tobacco: Never  Vaping Use   Vaping Use: Never used  Substance and Sexual Activity   Alcohol use: No    Alcohol/week: 0.0 standard drinks of alcohol   Drug use: No   Sexual activity: Not Currently    Partners: Male    Birth control/protection:  Post-menopausal  Other Topics Concern   Not on file  Social History Narrative      Patient is disabled.   Education some college education.   Right handed.   Caffeine four cups of tea daily.   Social Determinants of Health   Financial Resource Strain: Not on file  Food Insecurity: Not on file  Transportation Needs: Not on file  Physical Activity: Not on file  Stress: Not on file  Social Connections: Not on file  Intimate Partner Violence: Not on file     OBSERVATIONS/OBJECTIVE:  BP 125/70   Pulse (!) 102   Temp (!) 97.5 F (36.4 C)   Resp 18   Wt 152 lb 3.2 oz (69 kg)   LMP 02/11/2008   SpO2 100%   BMI 27.84 kg/m  GENERAL: Patient is a well appearing female in no acute distress HEENT:  Sclerae anicteric.  Oropharynx clear and moist. No ulcerations or evidence of oropharyngeal candidiasis. Neck is supple.  NODES:  No cervical, supraclavicular, or axillary lymphadenopathy palpated.  BREAST EXAM: Left breast status postlumpectomy and radiation no sign of local recurrence right breast is benign. LUNGS:  Clear to auscultation bilaterally.  No wheezes or rhonchi. HEART:  Regular rate and rhythm. No murmur appreciated. ABDOMEN:  Soft, nontender.  Positive, normoactive bowel sounds. No organomegaly palpated. MSK:  No focal spinal tenderness to palpation. Full range of motion bilaterally in the upper extremities. EXTREMITIES:  No peripheral edema.   SKIN:  Clear with no obvious rashes or skin changes. No nail dyscrasia. NEURO:  Nonfocal. Well oriented.  Appropriate affect.   LABORATORY DATA:  None for this visit.  DIAGNOSTIC IMAGING:  None for this visit.      ASSESSMENT AND PLAN:  Amber Bowman is a pleasant 67 y.o. female with Stage IA left breast invasive ductal carcinoma, ER+/PR+/HER2-, diagnosed in 11/2021, treated with lumpectomy, adjuvant radiation therapy, and anti-estrogen therapy with Anastrozole.  She presents to the Survivorship Clinic for our initial meeting  and routine follow-up post-completion of treatment for breast cancer.    1. Stage IA left breast cancer:  Amber Bowman is continuing to recover from definitive treatment for breast cancer. She will follow-up with her medical oncologist, Dr. Lindi Adie in 6 months for f/u with history and physical exam per surveillance protocol.  She will continue her anti-estrogen therapy with Anastrozole. Thus far, she is tolerating the Anastrozole well, with minimal side effects. She was instructed to make Dr. Lindi Adie or myself aware if she begins to experience any worsening side effects of the medication and I could see her back in clinic to help manage those side effects, as needed. Her mammogram is due 10/2022; orders placed today. Today, a comprehensive survivorship care plan and treatment summary was reviewed with  the patient today detailing her breast cancer diagnosis, treatment course, potential late/long-term effects of treatment, appropriate follow-up care with recommendations for the future, and patient education resources.  A copy of this summary, along with a letter will be sent to the patient's primary care provider via mail/fax/In Basket message after today's visit.    2. Bone health:  Given Amber Bowman's age/history of breast cancer and her current treatment regimen including anti-estrogen therapy with Anastrozole, she is at risk for bone demineralization.  She is recommended to undergo bone density testing every 2 years.  Her most recent DEXA occurred on 04/30/2020 and demonstrated osteopenia with a t score of -2.3.   She was given education on specific activities to promote bone health.  3. Cancer screening:  Due to Amber Bowman's history and her age, she should receive screening for skin cancers, colon cancer, and gynecologic cancers.  The information and recommendations are listed on the patient's comprehensive care plan/treatment summary and were reviewed in detail with the patient.    4. Health maintenance and  wellness promotion: Amber Bowman was encouraged to consume 5-7 servings of fruits and vegetables per day. We reviewed the "Nutrition Rainbow" handout.  She was also encouraged to engage in moderate to vigorous exercise for 30 minutes per day most days of the week.  She was instructed to limit her alcohol consumption and continue to abstain from tobacco use.     5. Support services/counseling: It is not uncommon for this period of the patient's cancer care trajectory to be one of many emotions and stressors. She was given information regarding our available services and encouraged to contact me with any questions or for help enrolling in any of our support group/programs.    Follow up instructions:    -Return to cancer center in 6 months for f/u with Dr. Lindi Adie  -Mammogram due in 10/2022 -Bone density testing due -She is welcome to return back to the Survivorship Clinic at any time; no additional follow-up needed at this time.  -Consider referral back to survivorship as a long-term survivor for continued surveillance  The patient was provided an opportunity to ask questions and all were answered. The patient agreed with the plan and demonstrated an understanding of the instructions.   Total encounter time:40 minutes*in face-to-face visit time, chart review, lab review, care coordination, order entry, and documentation of the encounter time.  Wilber Bihari, NP 05/25/22 4:48 PM Medical Oncology and Hematology [facility] [place], [place] [address] Tel. [phone]    Fax. [phone]  *Total Encounter Time as defined by the Centers for Medicare and Medicaid Services includes, in addition to the face-to-face time of a patient visit (documented in the note above) non-face-to-face time: obtaining and reviewing outside history, ordering and reviewing medications, tests or procedures, care coordination (communications with other health care professionals or caregivers) and  documentation in the medical record.

## 2022-05-29 ENCOUNTER — Telehealth (HOSPITAL_BASED_OUTPATIENT_CLINIC_OR_DEPARTMENT_OTHER): Payer: Self-pay

## 2022-06-02 ENCOUNTER — Telehealth: Payer: Self-pay | Admitting: Urology

## 2022-06-02 NOTE — Telephone Encounter (Signed)
Attempted to return pts call, LMOM asking pt to return call.

## 2022-06-02 NOTE — Telephone Encounter (Signed)
Patient called and stated someone from Imaging called her and left her a message and patient was really confused and she wanted to talk to someone from this office before dealing with Imaging downstairs.  Patient's callback #: [phone]

## 2022-06-04 DIAGNOSIS — D84822 Immunodeficiency due to external causes: Secondary | ICD-10-CM | POA: Diagnosis not present

## 2022-06-04 DIAGNOSIS — E785 Hyperlipidemia, unspecified: Secondary | ICD-10-CM | POA: Diagnosis not present

## 2022-06-04 DIAGNOSIS — Z87440 Personal history of urinary (tract) infections: Secondary | ICD-10-CM | POA: Diagnosis not present

## 2022-06-04 DIAGNOSIS — Z961 Presence of intraocular lens: Secondary | ICD-10-CM | POA: Diagnosis not present

## 2022-06-04 DIAGNOSIS — Z79811 Long term (current) use of aromatase inhibitors: Secondary | ICD-10-CM | POA: Diagnosis not present

## 2022-06-04 DIAGNOSIS — I10 Essential (primary) hypertension: Secondary | ICD-10-CM | POA: Diagnosis not present

## 2022-06-04 DIAGNOSIS — C50919 Malignant neoplasm of unspecified site of unspecified female breast: Secondary | ICD-10-CM | POA: Diagnosis not present

## 2022-06-04 DIAGNOSIS — Z8249 Family history of ischemic heart disease and other diseases of the circulatory system: Secondary | ICD-10-CM | POA: Diagnosis not present

## 2022-06-04 DIAGNOSIS — K219 Gastro-esophageal reflux disease without esophagitis: Secondary | ICD-10-CM | POA: Diagnosis not present

## 2022-06-04 DIAGNOSIS — B369 Superficial mycosis, unspecified: Secondary | ICD-10-CM | POA: Diagnosis not present

## 2022-06-04 DIAGNOSIS — G40909 Epilepsy, unspecified, not intractable, without status epilepticus: Secondary | ICD-10-CM | POA: Diagnosis not present

## 2022-06-04 DIAGNOSIS — D84821 Immunodeficiency due to drugs: Secondary | ICD-10-CM | POA: Diagnosis not present

## 2022-06-15 ENCOUNTER — Ambulatory Visit: Payer: Medicare HMO

## 2022-06-17 ENCOUNTER — Telehealth (HOSPITAL_BASED_OUTPATIENT_CLINIC_OR_DEPARTMENT_OTHER): Payer: Self-pay

## 2022-07-01 ENCOUNTER — Telehealth (HOSPITAL_BASED_OUTPATIENT_CLINIC_OR_DEPARTMENT_OTHER): Payer: Self-pay

## 2022-07-03 ENCOUNTER — Ambulatory Visit (HOSPITAL_BASED_OUTPATIENT_CLINIC_OR_DEPARTMENT_OTHER)
Admission: RE | Admit: 2022-07-03 | Discharge: 2022-07-03 | Disposition: A | Discharge status: Home / Self Care | Payer: Medicare HMO | Source: Ambulatory Visit | Attending: Urology | Admitting: Urology

## 2022-07-03 DIAGNOSIS — N200 Calculus of kidney: Secondary | ICD-10-CM | POA: Diagnosis not present

## 2022-08-21 DIAGNOSIS — Z170 Estrogen receptor positive status [ER+]: Secondary | ICD-10-CM | POA: Diagnosis not present

## 2022-08-21 DIAGNOSIS — Z0000 Encounter for general adult medical examination without abnormal findings: Secondary | ICD-10-CM | POA: Diagnosis not present

## 2022-08-21 DIAGNOSIS — G40909 Epilepsy, unspecified, not intractable, without status epilepticus: Secondary | ICD-10-CM | POA: Diagnosis not present

## 2022-08-21 DIAGNOSIS — K8020 Calculus of gallbladder without cholecystitis without obstruction: Secondary | ICD-10-CM | POA: Diagnosis not present

## 2022-08-21 DIAGNOSIS — C50512 Malignant neoplasm of lower-outer quadrant of left female breast: Secondary | ICD-10-CM | POA: Diagnosis not present

## 2022-08-21 DIAGNOSIS — Z79899 Other long term (current) drug therapy: Secondary | ICD-10-CM | POA: Diagnosis not present

## 2022-08-21 DIAGNOSIS — I700 Atherosclerosis of aorta: Secondary | ICD-10-CM | POA: Diagnosis not present

## 2022-08-21 DIAGNOSIS — E7800 Pure hypercholesterolemia, unspecified: Secondary | ICD-10-CM | POA: Diagnosis not present

## 2022-08-21 DIAGNOSIS — K449 Diaphragmatic hernia without obstruction or gangrene: Secondary | ICD-10-CM | POA: Diagnosis not present

## 2022-08-21 DIAGNOSIS — Z1211 Encounter for screening for malignant neoplasm of colon: Secondary | ICD-10-CM | POA: Diagnosis not present

## 2022-09-01 DIAGNOSIS — G40219 Localization-related (focal) (partial) symptomatic epilepsy and epileptic syndromes with complex partial seizures, intractable, without status epilepticus: Secondary | ICD-10-CM | POA: Diagnosis not present

## 2022-09-08 DIAGNOSIS — H04562 Stenosis of left lacrimal punctum: Secondary | ICD-10-CM | POA: Diagnosis not present

## 2022-10-29 ENCOUNTER — Ambulatory Visit
Admission: RE | Admit: 2022-10-29 | Discharge: 2022-10-29 | Disposition: A | Discharge status: Home / Self Care | Payer: Medicare HMO | Source: Ambulatory Visit | Attending: Adult Health | Admitting: Adult Health

## 2022-10-29 DIAGNOSIS — Z170 Estrogen receptor positive status [ER+]: Secondary | ICD-10-CM

## 2022-10-29 DIAGNOSIS — Z9889 Other specified postprocedural states: Secondary | ICD-10-CM | POA: Diagnosis not present

## 2022-10-29 DIAGNOSIS — Z853 Personal history of malignant neoplasm of breast: Secondary | ICD-10-CM | POA: Diagnosis not present

## 2022-10-29 HISTORY — DX: Malignant neoplasm of unspecified site of unspecified female breast: C50.919

## 2022-10-29 HISTORY — DX: Personal history of irradiation: Z92.3

## 2022-10-29 HISTORY — DX: Personal history of antineoplastic chemotherapy: Z92.21

## 2022-11-13 ENCOUNTER — Telehealth: Payer: Self-pay

## 2022-11-13 NOTE — Telephone Encounter (Signed)
Pt called to confirm appt with Dr Pamelia Hoit next week 9/13. She states she does not remember him. Reviewed past visits with her and sge still does not recall who Dr Pamelia Hoit is. Verified X2 I am speaking with the correct pt and she answered identifiers appropriately.  She confirmed her appt for next week.

## 2022-11-17 NOTE — Progress Notes (Signed)
Patient Care Team: Darrow Bussing, MD as PCP - General (Family Medicine) Griselda Miner, MD as Consulting Physician (General Surgery) Serena Croissant, MD as Consulting Physician (Hematology and Oncology) Lonie Peak, MD as Attending Physician (Radiation Oncology)  DIAGNOSIS:  Encounter Diagnosis  Name Primary?   Malignant neoplasm of lower-outer quadrant of left breast of female, estrogen receptor positive ([facility]) Yes    SUMMARY OF ONCOLOGIC HISTORY: Oncology History  Malignant neoplasm of lower-outer quadrant of left breast of female, estrogen receptor positive ([facility])  11/20/2021 Initial Diagnosis   Screening mammogram detected left breast distortion.  Ultrasound revealed 2 areas of abnormality both of which were biopsied and were found to be benign fibroadenoma and fibrocystic change.  Stereotactic biopsy of the distortion revealed grade 1 IDC with DCIS ER 98%, PR 0%, Ki-67 10%, HER2 negative   11/26/2021 Cancer Staging   Staging form: Breast, AJCC 8th Edition - Clinical stage from 11/26/2021: Stage IA (cT1c, cN0, cM0, G1, ER+, PR-, HER2-) - Signed by Serena Croissant, MD on 11/26/2021 Stage prefix: Initial diagnosis Histologic grading system: 3 grade system   12/08/2021 Surgery   Left lumpectomy: Grade 1 IDC 0.9 cm, intermediate grade DCIS, margins negative, lymphovascular invasion present, 1/6 lymph nodes positive with nodal extension, ER 98%, PR 0%, HER2 negative 1+, Ki-67 10%      12/08/2021 Oncotype testing   Oncotype DX testing: Score 22 (ROR 18%) no chemotherapy    01/12/2022 - 02/23/2022 Radiation Therapy   Plan Name: Breast_L Site: Breast, Left Technique: 3D Mode: Photon Dose Per Fraction: 2 Gy Prescribed Dose (Delivered / Prescribed): 50 Gy / 50 Gy Prescribed Fxs (Delivered / Prescribed): 25 / 25   Plan Name: Breast_L_SCV Site: Breast, Left Technique: 3D Mode: Photon Dose Per Fraction: 2 Gy Prescribed Dose (Delivered / Prescribed): 50 Gy / 50 Gy Prescribed Fxs  (Delivered / Prescribed): 25 / 25   Plan Name: Breast_L_Bst Site: Breast, Left Technique: 3D Mode: Photon Dose Per Fraction: 2 Gy Prescribed Dose (Delivered / Prescribed): 10 Gy / 10 Gy Prescribed Fxs (Delivered / Prescribed): 5 / 5   02/2022 -  Anti-estrogen oral therapy   Anastrozole x 7 years     CHIEF COMPLIANT:   Discussed the use of AI scribe software for clinical note transcription with the patient, who gave verbal consent to proceed.  History of Present Illness   The patient, with a history of breast cancer, has been on anastrozole since January. She reports experiencing hot flashes, which were present prior to starting the medication and have not worsened. Occasionally, she experiences mild, sharp pains in the breast, which she describes as common and not concerning. She maintains an active lifestyle, walking daily. She has had a mammogram recently, which showed normal results. She also undergoes regular bone density tests, the most recent of which was about a year ago and showed normal results.         ALLERGIES:  has No Known Allergies.  MEDICATIONS:  Current Outpatient Medications  Medication Sig Dispense Refill   anastrozole (ARIMIDEX) 1 MG tablet Take 1 tablet (1 mg total) by mouth daily. 90 tablet 3   atorvastatin (LIPITOR) 10 MG tablet Take 10 mg by mouth at bedtime.     felbamate (FELBATOL) 600 MG tablet Take 0.5 tablets (300 mg total) by mouth at bedtime. 30 tablet 11   ketoconazole (NIZORAL) 2 % cream Apply 1 Application topically daily. 15 g 0   levETIRAcetam (KEPPRA) 500 MG tablet TAKE 1 TABLET BY MOUTH TWICE  A DAY (Patient taking differently: Take 750 mg by mouth in the morning and at bedtime.) 180 tablet 0   NONFORMULARY OR COMPOUNDED ITEM Apply 1 application  topically See admin instructions. Compounded cream (Amitriptyline -Gabapentin-Lidocaine Cream 5/10/5%)- Apply to the lips 4 times a day as needed/as directed     omeprazole (PRILOSEC) 40 MG capsule  Take 40 mg by mouth daily before breakfast.     Propylene Glycol, PF, (SYSTANE COMPLETE PF) 0.6 % SOLN Place 1 drop into both eyes daily as needed (for dryness).     No current facility-administered medications for this visit.    PHYSICAL EXAMINATION: ECOG PERFORMANCE STATUS: 1 - Symptomatic but completely ambulatory  Vitals:   11/20/22 1007  BP: (!) 119/57  Pulse: 68  Resp: 18  Temp: 97.7 F (36.5 C)  SpO2: 98%   Filed Weights   11/20/22 1007  Weight: 158 lb 8 oz (71.9 kg)    Physical Exam          (exam performed in the presence of a chaperone)  LABORATORY DATA:  I have reviewed the data as listed    Latest Ref Rng & Units 04/01/2022    4:50 PM 11/26/2021   12:06 PM 06/25/2021    2:14 PM  CMP  Glucose 70 - 99 mg/dL 478  97  295   BUN 8 - 23 mg/dL 19  15  29    Creatinine 0.44 - 1.00 mg/dL 6.21  3.08  6.57   Sodium 135 - 145 mmol/L 135  142  138   Potassium 3.5 - 5.1 mmol/L 3.9  4.5  3.7   Chloride 98 - 111 mmol/L 105  112  108   CO2 22 - 32 mmol/L 22  25    Calcium 8.9 - 10.3 mg/dL 9.4  9.0    Total Protein 6.5 - 8.1 g/dL  6.9    Total Bilirubin 0.3 - 1.2 mg/dL  0.3    Alkaline Phos 38 - 126 U/L  114    AST 15 - 41 U/L  14    ALT 0 - 44 U/L  13      Lab Results  Component Value Date   WBC 14.8 (H) 04/01/2022   HGB 12.1 04/01/2022   HCT 38.0 04/01/2022   MCV 93.1 04/01/2022   PLT 426 (H) 04/01/2022   NEUTROABS 12.0 (H) 04/01/2022    ASSESSMENT & PLAN:  Malignant neoplasm of lower-outer quadrant of left breast of female, estrogen receptor positive ([facility]) 11/20/2021:Screening mammogram detected left breast distortion.  Ultrasound revealed 2 areas of abnormality both of which were biopsied and were found to be benign fibroadenoma and fibrocystic change.  Stereotactic biopsy of the distortion revealed grade 1 IDC with DCIS ER 98%, PR 0%, Ki-67 10%, HER2 negative   12/08/2021: Left lumpectomy: Grade 1 IDC 0.9 cm, intermediate grade DCIS, margins negative,  lymphovascular invasion present, 1/6 lymph nodes positive with nodal extension, ER 98%, PR 0%, HER2 negative 1+, Ki-67 10%   Oncotype DX testing: Score 22 (ROR 18%) no chemotherapy  Adjuvant radiation therapy: 01/13/2022-02/23/2022   Treatment plan: Anastrozole 1 mg p.o. daily started 03/09/2022   Anastrozole toxicities: Mild hot flashes but no different than before. Denies any joint aches or pains.  Breast cancer surveillance: Breast exam 11/20/2022: Benign Mammogram 10/29/2022: Benign breast density category C  Return to clinic in 1 year for follow-up ------------------------------------- Assessment and Plan    Breast Cancer On Anastrozole since January with tolerable hot flashes. Reports occasional mild  sharp pain in the breast, which is common and not concerning. Mammogram on 10/29/2022 was normal. -Continue Anastrozole. -Next follow-up in one year after the next mammogram.  Bone Health Patient reports having bone density scans outside of the [facility] system, last one approximately a year ago with normal results. -Obtain a copy of the most recent bone density scan for review. -Continue regular bone density scans as recommended by primary care physician.  General Health Maintenance Patient reports regular exercise and a diet rich in fruits and vegetables. -Continue current lifestyle habits for overall health maintenance.          No orders of the defined types were placed in this encounter.  The patient has a good understanding of the overall plan. she agrees with it. she will call with any problems that may develop before the next visit here. Total time spent: 30 mins including face to face time and time spent for planning, charting and co-ordination of care   Tamsen Meek, MD 11/20/22

## 2022-11-20 ENCOUNTER — Inpatient Hospital Stay: Payer: Medicare HMO | Attending: Hematology and Oncology | Admitting: Hematology and Oncology

## 2022-11-20 ENCOUNTER — Other Ambulatory Visit: Payer: Self-pay | Admitting: Adult Health

## 2022-11-20 VITALS — BP 119/57 | HR 68 | Temp 97.7°F | Resp 18 | Ht 62.0 in | Wt 158.5 lb

## 2022-11-20 DIAGNOSIS — C50512 Malignant neoplasm of lower-outer quadrant of left female breast: Secondary | ICD-10-CM | POA: Insufficient documentation

## 2022-11-20 DIAGNOSIS — Z79811 Long term (current) use of aromatase inhibitors: Secondary | ICD-10-CM | POA: Insufficient documentation

## 2022-11-20 DIAGNOSIS — Z923 Personal history of irradiation: Secondary | ICD-10-CM | POA: Insufficient documentation

## 2022-11-20 DIAGNOSIS — R232 Flushing: Secondary | ICD-10-CM | POA: Insufficient documentation

## 2022-11-20 DIAGNOSIS — Z170 Estrogen receptor positive status [ER+]: Secondary | ICD-10-CM | POA: Diagnosis not present

## 2022-11-20 NOTE — Assessment & Plan Note (Addendum)
11/20/2021:Screening mammogram detected left breast distortion.  Ultrasound revealed 2 areas of abnormality both of which were biopsied and were found to be benign fibroadenoma and fibrocystic change.  Stereotactic biopsy of the distortion revealed grade 1 IDC with DCIS ER 98%, PR 0%, Ki-67 10%, HER2 negative   12/08/2021: Left lumpectomy: Grade 1 IDC 0.9 cm, intermediate grade DCIS, margins negative, lymphovascular invasion present, 1/6 lymph nodes positive with nodal extension, ER 98%, PR 0%, HER2 negative 1+, Ki-67 10%   Oncotype DX testing: Score 22 (ROR 18%) no chemotherapy  Adjuvant radiation therapy: 01/13/2022-02/23/2022   Treatment plan: Anastrozole 1 mg p.o. daily started 03/09/2022   Anastrozole toxicities: Mild hot flashes but no different than before. Denies any joint aches or pains.  Breast cancer surveillance: Breast exam 11/20/2022: Benign Mammogram 10/29/2022: Benign breast density category C  Return to clinic in 1 year for follow-up

## 2023-02-02 DIAGNOSIS — G40219 Localization-related (focal) (partial) symptomatic epilepsy and epileptic syndromes with complex partial seizures, intractable, without status epilepticus: Secondary | ICD-10-CM | POA: Diagnosis not present

## 2023-02-16 DIAGNOSIS — Z23 Encounter for immunization: Secondary | ICD-10-CM | POA: Diagnosis not present

## 2023-02-22 DIAGNOSIS — R748 Abnormal levels of other serum enzymes: Secondary | ICD-10-CM | POA: Diagnosis not present

## 2023-02-22 DIAGNOSIS — G40919 Epilepsy, unspecified, intractable, without status epilepticus: Secondary | ICD-10-CM | POA: Diagnosis not present

## 2023-02-22 DIAGNOSIS — M8580 Other specified disorders of bone density and structure, unspecified site: Secondary | ICD-10-CM | POA: Diagnosis not present

## 2023-02-22 DIAGNOSIS — I700 Atherosclerosis of aorta: Secondary | ICD-10-CM | POA: Diagnosis not present

## 2023-02-22 DIAGNOSIS — C50919 Malignant neoplasm of unspecified site of unspecified female breast: Secondary | ICD-10-CM | POA: Diagnosis not present

## 2023-02-23 ENCOUNTER — Telehealth: Payer: Self-pay

## 2023-02-23 DIAGNOSIS — M8589 Other specified disorders of bone density and structure, multiple sites: Secondary | ICD-10-CM

## 2023-02-23 NOTE — Telephone Encounter (Signed)
Pt LVM in the triage line stating that she needs to schedule her DXA scan.   LAEX 03/13/2021-TW (MCR risk unknown-recall placed for 03/2023, nothing currently scheduled) LDEXA-04/30/2020-osteopenia (T-score -2.3)   Please advise.

## 2023-02-23 NOTE — Telephone Encounter (Signed)
She is welcome to schedule DXA at an alternate location.

## 2023-02-24 NOTE — Telephone Encounter (Signed)
LVMTCB

## 2023-03-04 NOTE — Telephone Encounter (Signed)
Spoke w/ pt and she decided that she will go to the [facility] location.   Order placed and advised pt to call them tomorrow to schedule since it is almost the end of the business day.  Pt voiced understanding and appreciation for the cb.

## 2023-03-09 ENCOUNTER — Ambulatory Visit (INDEPENDENT_AMBULATORY_CARE_PROVIDER_SITE_OTHER): Payer: Medicare HMO | Admitting: Urology

## 2023-03-09 ENCOUNTER — Encounter: Payer: Self-pay | Admitting: Urology

## 2023-03-09 ENCOUNTER — Ambulatory Visit (HOSPITAL_BASED_OUTPATIENT_CLINIC_OR_DEPARTMENT_OTHER)
Admission: RE | Admit: 2023-03-09 | Discharge: 2023-03-09 | Disposition: A | Discharge status: Home / Self Care | Payer: Medicare HMO | Source: Ambulatory Visit | Attending: Urology | Admitting: Urology

## 2023-03-09 VITALS — BP 117/62 | HR 76 | Ht 63.0 in | Wt 158.0 lb

## 2023-03-09 DIAGNOSIS — M549 Dorsalgia, unspecified: Secondary | ICD-10-CM | POA: Diagnosis not present

## 2023-03-09 DIAGNOSIS — N200 Calculus of kidney: Secondary | ICD-10-CM

## 2023-03-09 DIAGNOSIS — N9489 Other specified conditions associated with female genital organs and menstrual cycle: Secondary | ICD-10-CM

## 2023-03-09 DIAGNOSIS — M545 Low back pain, unspecified: Secondary | ICD-10-CM

## 2023-03-09 DIAGNOSIS — Z87442 Personal history of urinary calculi: Secondary | ICD-10-CM | POA: Diagnosis not present

## 2023-03-09 DIAGNOSIS — Z87440 Personal history of urinary (tract) infections: Secondary | ICD-10-CM

## 2023-03-09 DIAGNOSIS — R300 Dysuria: Secondary | ICD-10-CM | POA: Diagnosis not present

## 2023-03-09 LAB — URINALYSIS, ROUTINE W REFLEX MICROSCOPIC
Bilirubin, UA: NEGATIVE
Glucose, UA: NEGATIVE
Ketones, UA: NEGATIVE
Leukocytes,UA: NEGATIVE
Nitrite, UA: NEGATIVE
RBC, UA: NEGATIVE
Specific Gravity, UA: >1.03 — ABNORMAL HIGH (ref 1.005–1.030)
Urobilinogen, Ur: 0.2 mg/dL (ref 0.2–1.0)
pH, UA: 6 (ref 5.0–7.5)

## 2023-03-09 NOTE — Progress Notes (Signed)
 Assessment: 1. Nephrolithiasis   2. History of UTI   3. Vaginal burning     Plan: She does not appear to have UTI today. Her back pain is not suggestive of nephrolithiasis. Will obtain a KUB today for evaluation given her history of nephrolithiasis. Recommend continued use of Monistat vaginal cream and if her symptoms do not improve, consider GYN evaluation. Will call her with results of the KUB. Return to office in 6 months.  Chief Complaint: Chief Complaint  Patient presents with   Nephrolithiasis    HPI: Amber Bowman is a 67 y.o. female who presents for continued evaluation of bilateral ureteral calculi and nephrolithiasis. She presented on 04/01/22 with a 3-week history of left back pain with associated frequency, urgency, and dysuria. Her urinalysis was suspicious for a UTI on 03/23/2022. Urine culture grew >100 K E. coli. She received Rocephin  and Cipro  on 03/25/2022. She completed the Cipro  on 03/31/22. She was evaluated with CT imaging on 03/31/2022 which showed a 4 mm calculus in the left distal ureter with associated obstruction and an 8 mm calculus in the right renal pelvis near the right UPJ without significant obstruction. Treatment options were discussed with the patient. She underwent cystoscopy, bilateral retrograde pyelograms, left ureteroscopy, stone manipulation, and insertion of bilateral ureteral stents on 04/01/22. A staged procedure for the right proximal calculus with stent placement  followed by shockwave lithotripsy in the near future was recommended for the proximal right ureteral calculus. At her visit on 04/09/2022, she continued on her cefdinir .  She reported low back pain as well as discomfort with voiding.  No fevers or chills.  No gross hematuria.  No flank pain.  She was not taking Pyridium  on a regular basis. Her urinalysis was nitrite positive.  Planned stent removal was postponed due to a possible UTI.  She was given 1 g of Rocephin  IM and was changed to  Cipro  twice daily x 7 days. Urine culture grew <10K colonies. Her left ureteral stent was removed on 04/14/22. She underwent right ESL on 04/20/22. KUB from 04/28/2022 showed a calcification measuring approximately 6 mm along the proximal aspect of the right ureteral stent as well as multiple fragments along the mid to distal portion of the stent.  Treatment options for the proximal ureteral calculus discussed. She underwent right ESL on 05/04/22 for a proximal right ureteral calculus. She has done well since the lithotripsy procedure.   Her stent was removed on 05/07/22.  Stone analysis: 20% calcium  oxalate monohydrate, 80% calcium  phosphate  At her visit in 3/24, she was doing very well following the stent removal.  She had passed a number of stone fragments without difficulty.  No flank pain.  She was not having any lower urinary tract symptoms. KUB from 3/24 did not show any obvious calcifications along the expected course of the right ureter. Renal ultrasound from 4/24 showed no evidence of hydronephrosis.  She returns today for evaluation of low back pain and dysuria.  She had onset of symptoms approximately 1 week ago.  She reports pain in the right low back area which is intermittent in nature.  This is improved with standing.  She is not having any flank pain.  She also reports burning in the vaginal area.  She has taken AZO with some improvement.  She is also been using Monistat vaginal cream with improvement in the burning.  No vaginal discharge.  Portions of the above documentation were copied from a prior visit for review purposes  only.  Allergies: No Known Allergies  PMH: Past Medical History:  Diagnosis Date   Anemia    Breast cancer (HCC)    Cancer (HCC)    left breast   Cardiomyopathy (HCC) 06/08/2019   Pt denies   CHF (congestive heart failure) (HCC) 07/2019   per denies this dx   Colon polyps 2014   Condyloma    Depression    Epilepsy (HCC)    last seizure reported  was 9//2023   GERD (gastroesophageal reflux disease)    Osteopenia 04/2018   T score -1.8 FRAX 9.5% / 1.1% overall stable from prior DEXA.  Patient denies   Personal history of chemotherapy    Personal history of radiation therapy    Seizures (HCC)    last on 11/2021,  few seizures per month  per husband   Thrombocytopenia (HCC)    Pt denies    PSH: Past Surgical History:  Procedure Laterality Date   BREAST LUMPECTOMY     BREAST LUMPECTOMY WITH RADIOACTIVE SEED AND SENTINEL LYMPH NODE BIOPSY Left 12/08/2021   Procedure: LEFT BREAST LUMPECTOMY WITH RADIOACTIVE SEED AND SENTINEL LYMPH NODE BIOPSY;  Surgeon: Curvin Deward MOULD, MD;  Location: [facility] OR;  Service: General;  Laterality: Left;   CATARACT EXTRACTION Bilateral 03/10/2015   CYSTOSCOPY W/ RETROGRADES Right 06/25/2021   Procedure: CYSTOSCOPY WITH RIGHT RETROGRADE PYELOGRAM, REMOVAL OF BLADDER STONE;  Surgeon: Renda Glance, MD;  Location: [facility];  Service: Urology;  Laterality: Right;   CYSTOSCOPY WITH RETROGRADE PYELOGRAM, URETEROSCOPY AND STENT PLACEMENT Bilateral 04/01/2022   Procedure: CYSTOSCOPY WITH BILATERAL RETROGRADE PYELOGRAM, LEFT URETEROSCOPY LASER STONE MANIPulation AND BILATERAL STENT PLACEMENT;  Surgeon: Roseann Adine PARAS., MD;  Location: [facility];  Service: Urology;  Laterality: Bilateral;   CYSTOSCOPY/URETEROSCOPY/HOLMIUM LASER/STENT PLACEMENT Left 09/04/2019   Procedure: CYSTOSCOPY/RETROGRADE/ URETEROSCOPY/HOLMIUM LASER/STENT PLACEMENT/ REMOVAL OF NEPHROSTOMY TUBE;  Surgeon: Renda Glance, MD;  Location: [facility];  Service: Urology;  Laterality: Left;   EXTRACORPOREAL SHOCK WAVE LITHOTRIPSY Right 04/20/2022   Procedure: EXTRACORPOREAL SHOCK WAVE LITHOTRIPSY (ESWL);  Surgeon: Roseann Adine PARAS., MD;  [facility]  Service: Urology;  Laterality: Right;   EXTRACORPOREAL SHOCK WAVE LITHOTRIPSY Right 05/04/2022   Procedure: EXTRACORPOREAL SHOCK WAVE LITHOTRIPSY (ESWL);  Surgeon: Shona Layman BROCKS,  MD;  Location[facility];  Service: Urology;  Laterality: Right;   IR NEPHROSTOMY EXCHANGE LEFT  08/17/2019   IR NEPHROSTOMY PLACEMENT LEFT  06/28/2019    SH: Social History   Tobacco Use   Smoking status: Never   Smokeless tobacco: Never  Vaping Use   Vaping status: Never Used  Substance Use Topics   Alcohol use: No    Alcohol/week: 0.0 standard drinks of alcohol   Drug use: No    ROS: Constitutional:  Negative for fever, chills, weight loss CV: Negative for chest pain, previous MI, hypertension Respiratory:  Negative for shortness of breath, wheezing, sleep apnea, frequent cough GI:  Negative for nausea, vomiting, bloody stool, GERD  PE: BP 117/62   Pulse 76   Ht 5' 3 (1.6 m)   Wt 158 lb (71.7 kg)   LMP 02/11/2008   BMI 27.99 kg/m  GENERAL APPEARANCE:  Well appearing, well developed, well nourished, NAD HEENT:  Atraumatic, normocephalic, oropharynx clear NECK:  Supple without lymphadenopathy or thyromegaly ABDOMEN:  Soft, non-tender, no masses EXTREMITIES:  Moves all extremities well, without clubbing, cyanosis, or edema NEUROLOGIC:  Alert and oriented x 3, normal gait, CN II-XII grossly intact MENTAL STATUS:  appropriate BACK:  Non-tender to palpation, No CVAT SKIN:  Warm, dry, and intact   Results: U/A: trace protein

## 2023-03-15 ENCOUNTER — Telehealth: Payer: Self-pay

## 2023-03-15 NOTE — Telephone Encounter (Signed)
-----   Message from Amber Bowman sent at 03/15/2023 10:01 AM EST ----- Please notify the patient that her KUB from last week did not show any obvious kidney stones.

## 2023-03-15 NOTE — Telephone Encounter (Signed)
LMOM asking pt to return call for results.

## 2023-03-22 NOTE — Telephone Encounter (Signed)
 Left msg requesting a call back from patient regarding results.

## 2023-03-31 ENCOUNTER — Ambulatory Visit (HOSPITAL_BASED_OUTPATIENT_CLINIC_OR_DEPARTMENT_OTHER)
Admission: RE | Admit: 2023-03-31 | Discharge: 2023-03-31 | Disposition: A | Discharge status: Home / Self Care | Payer: Medicare HMO | Source: Ambulatory Visit | Attending: Nurse Practitioner | Admitting: Nurse Practitioner

## 2023-03-31 DIAGNOSIS — M8589 Other specified disorders of bone density and structure, multiple sites: Secondary | ICD-10-CM | POA: Diagnosis not present

## 2023-04-01 NOTE — Telephone Encounter (Signed)
DXA performed on 03/31/2023. Encounter closed.

## 2023-04-05 DIAGNOSIS — Z008 Encounter for other general examination: Secondary | ICD-10-CM | POA: Diagnosis not present

## 2023-04-15 ENCOUNTER — Telehealth: Payer: Self-pay

## 2023-04-15 NOTE — Telephone Encounter (Signed)
 LVMTCB on mobile #.  Called home #, no answer, per DPR LDVMOM with recommendations.  Encounter closed.

## 2023-04-15 NOTE — Telephone Encounter (Signed)
 Recommend contacting urology or PCP for appointment. If not an option, urgent care recommended.

## 2023-04-15 NOTE — Telephone Encounter (Signed)
 Pt LVM in triage line c/o UTI sxs of burning. Has tried some OTC meds to help w/o relief. Denies bleeding.  Stated that she spoke w/ appt desk to make an appt but was advised first available wouldn't be until next week (Tuesday 04/20/2023).   Please advise.

## 2023-04-19 ENCOUNTER — Ambulatory Visit: Payer: Medicare HMO | Admitting: Nurse Practitioner

## 2023-04-19 ENCOUNTER — Other Ambulatory Visit (HOSPITAL_COMMUNITY)
Admission: RE | Admit: 2023-04-19 | Discharge: 2023-04-19 | Disposition: A | Discharge status: Home / Self Care | Payer: Medicare HMO | Source: Ambulatory Visit | Attending: Obstetrics and Gynecology | Admitting: Obstetrics and Gynecology

## 2023-04-19 ENCOUNTER — Encounter: Payer: Self-pay | Admitting: Obstetrics and Gynecology

## 2023-04-19 ENCOUNTER — Other Ambulatory Visit: Payer: Self-pay | Admitting: Obstetrics and Gynecology

## 2023-04-19 ENCOUNTER — Ambulatory Visit (INDEPENDENT_AMBULATORY_CARE_PROVIDER_SITE_OTHER): Payer: Medicare HMO | Admitting: Obstetrics and Gynecology

## 2023-04-19 VITALS — BP 104/62 | HR 107 | Temp 98.4°F | Wt 160.8 lb

## 2023-04-19 DIAGNOSIS — N762 Acute vulvitis: Secondary | ICD-10-CM | POA: Diagnosis not present

## 2023-04-19 DIAGNOSIS — R300 Dysuria: Secondary | ICD-10-CM

## 2023-04-19 MED ORDER — NYSTATIN-TRIAMCINOLONE 100000-0.1 UNIT/GM-% EX OINT: 1.0000 | TOPICAL_OINTMENT | Freq: Two times a day (BID) | CUTANEOUS | 0 refills | Status: DC

## 2023-04-19 MED ORDER — NYSTATIN 100000 UNIT/GM EX OINT: 1.0000 | TOPICAL_OINTMENT | Freq: Two times a day (BID) | CUTANEOUS | 0 refills | Status: AC

## 2023-04-19 MED ORDER — SULFAMETHOXAZOLE-TRIMETHOPRIM 800-160 MG PO TABS: 1.0000 | ORAL_TABLET | Freq: Two times a day (BID) | ORAL | 0 refills | Status: AC

## 2023-04-19 MED ORDER — TRIAMCINOLONE ACETONIDE 0.025 % EX OINT: 1.0000 | TOPICAL_OINTMENT | Freq: Two times a day (BID) | CUTANEOUS | 0 refills | Status: AC

## 2023-04-19 NOTE — Telephone Encounter (Signed)
 Pharmacy is requesting alternative drug. Medication not covered.

## 2023-04-19 NOTE — Progress Notes (Signed)
 GYNECOLOGY  VISIT   HPI: 68 y.o.   Legally Separated  Caucasian female   G0P0 with Patient's last menstrual period was 02/11/2008.   here for: UTI   She states that she has been having burning for about 2 months. She has tired to get it to go away with over the counter medications. Patient states that she walks a lot.   Has dysuria and external vulvar irritation after wiping.   No blood in the urine.   No frequency or urgency.   No fever, shakes or chills.  Has constant back pain.  No change in this.   Tried Monistat cream.    No vaginal discharge or itching.    Had some vaginal odor prior to using monistat.   Not sexually active since her last visit here.   Hx E Coli sepsis.   Hx renal stones.   GYNECOLOGIC HISTORY: Patient's last menstrual period was 02/11/2008. Contraception:  PMP Menopausal hormone therapy:  n/a Last 2 paps:  03/12/20 normal History of abnormal Pap or positive HPV:  no Mammogram:  10/24/20 normal        OB History     Gravida  0   Para      Term      Preterm      AB      Living         SAB      IAB      Ectopic      Multiple      Live Births                 Patient Active Problem List   Diagnosis Date Noted   Ureteral calculus 05/07/2022   Nephrolithiasis 05/07/2022   Malignant neoplasm of lower-outer quadrant of left breast of female, estrogen receptor positive (HCC) 11/24/2021   Chronic systolic CHF (congestive heart failure) (HCC) 07/09/2019   Overweight (BMI 25.0-29.9) 07/08/2019   COVID-19 virus infection 07/08/2019   Septic shock due to Escherichia coli (HCC) 07/08/2019   Acute pyelonephritis 07/08/2019   Unspecified mood (affective) disorder (HCC) 07/08/2019   Leukocytosis 07/08/2019   Thrombocytopenia (HCC) 07/08/2019   Acute renal failure (ARF) (HCC) 06/27/2019   Vitamin D  deficiency 05/31/2014   Partial epilepsy with impairment of consciousness, intractable (HCC) 11/03/2013   Condyloma    Epilepsy  (HCC)    Depression     Past Medical History:  Diagnosis Date   Anemia    Breast cancer (HCC)    Cancer (HCC)    left breast   Cardiomyopathy (HCC) 06/08/2019   Pt denies   CHF (congestive heart failure) (HCC) 07/2019   per denies this dx   Colon polyps 2014   Condyloma    Depression    Epilepsy (HCC)    last seizure reported was 9//2023   GERD (gastroesophageal reflux disease)    Osteopenia 04/2018   T score -1.8 FRAX 9.5% / 1.1% overall stable from prior DEXA.  Patient denies   Personal history of chemotherapy    Personal history of radiation therapy    Seizures (HCC)    last on 11/2021,  "few seizures per month " per husband   Thrombocytopenia (HCC)    Pt denies    Past Surgical History:  Procedure Laterality Date   BREAST LUMPECTOMY     BREAST LUMPECTOMY WITH RADIOACTIVE SEED AND SENTINEL LYMPH NODE BIOPSY Left 12/08/2021   Procedure: LEFT BREAST LUMPECTOMY WITH RADIOACTIVE SEED AND SENTINEL LYMPH NODE BIOPSY;  Surgeon: Caralyn Chandler, MD;  Location: [facility] OR;  Service: General;  Laterality: Left;   CATARACT EXTRACTION Bilateral 03/10/2015   CYSTOSCOPY W/ RETROGRADES Right 06/25/2021   Procedure: CYSTOSCOPY WITH RIGHT RETROGRADE PYELOGRAM, REMOVAL OF BLADDER STONE;  Surgeon: Florencio Hunting, MD;  Location: [facility];  Service: Urology;  Laterality: Right;   CYSTOSCOPY WITH RETROGRADE PYELOGRAM, URETEROSCOPY AND STENT PLACEMENT Bilateral 04/01/2022   Procedure: CYSTOSCOPY WITH BILATERAL RETROGRADE PYELOGRAM, LEFT URETEROSCOPY LASER STONE MANIPulation AND BILATERAL STENT PLACEMENT;  Surgeon: Mellie Sprinkle., MD;  Location: [facility];  Service: Urology;  Laterality: Bilateral;   CYSTOSCOPY/URETEROSCOPY/HOLMIUM LASER/STENT PLACEMENT Left 09/04/2019   Procedure: CYSTOSCOPY/RETROGRADE/ URETEROSCOPY/HOLMIUM LASER/STENT PLACEMENT/ REMOVAL OF NEPHROSTOMY TUBE;  Surgeon: Florencio Hunting, MD;  Location: [facility];  Service: Urology;  Laterality: Left;   EXTRACORPOREAL SHOCK WAVE LITHOTRIPSY  Right 04/20/2022   Procedure: EXTRACORPOREAL SHOCK WAVE LITHOTRIPSY (ESWL);  Surgeon: Mellie Sprinkle., MD;  Location[facility];  Service: Urology;  Laterality: Right;   EXTRACORPOREAL SHOCK WAVE LITHOTRIPSY Right 05/04/2022   Procedure: EXTRACORPOREAL SHOCK WAVE LITHOTRIPSY (ESWL);  Surgeon: Scarlet Curly, MD;  Location: [facility];  Service: Urology;  Laterality: Right;   IR NEPHROSTOMY EXCHANGE LEFT  08/17/2019   IR NEPHROSTOMY PLACEMENT LEFT  06/28/2019    Current Outpatient Medications  Medication Sig Dispense Refill   anastrozole  (ARIMIDEX ) 1 MG tablet Take 1 tablet (1 mg total) by mouth daily. 90 tablet 3   atorvastatin (LIPITOR) 10 MG tablet Take 10 mg by mouth at bedtime.     Cholecalciferol 50 MCG (2000 UT) TABS 1 tablet Orally Once a day     CRANBERRY PO Take by mouth.     felbamate  (FELBATOL ) 600 MG tablet Take 0.5 tablets (300 mg total) by mouth at bedtime. 30 tablet 11   ketoconazole  (NIZORAL ) 2 % cream APPLY TO AFFECTED AREA TOPICALLY DAILY 15 g 0   levETIRAcetam  (KEPPRA ) 500 MG tablet TAKE 1 TABLET BY MOUTH TWICE A DAY (Patient taking differently: Take 750 mg by mouth in the morning and at bedtime.) 180 tablet 0   NONFORMULARY OR COMPOUNDED ITEM Apply 1 application  topically See admin instructions. Compounded cream (Amitriptyline -Gabapentin -Lidocaine  Cream 5/10/5%)- Apply to the lips 4 times a day as needed/as directed     Omega-3 Fatty Acids (FISH OIL) 1200 MG CAPS 1 capsule Orally Twice a day     omeprazole (PRILOSEC) 40 MG capsule Take 40 mg by mouth daily before breakfast.     Propylene Glycol, PF, (SYSTANE COMPLETE PF) 0.6 % SOLN Place 1 drop into both eyes daily as needed (for dryness).     Turmeric Curcumin 07-998 MG CAPS 1/2 capsule Orally twice a day     zonisamide  (ZONEGRAN ) 50 MG capsule Take 50 mg by mouth daily.     No current facility-administered medications for this visit.     ALLERGIES: Patient has no known  allergies.  Family History  Problem Relation Age of Onset   Hypertension Mother    Diabetes Mother    Heart disease Mother    Hypertension Father    Heart disease Father    Diabetes Sister    Hypertension Sister    Heart disease Sister    Skin cancer Paternal Uncle    Seizures Neg Hx     Social History   Socioeconomic History   Marital status: Legally Separated    Spouse name: Not on file   Number of children: 0   Years of education: 19  Highest education level: Not on file  Occupational History    Comment: Disabled.  Tobacco Use   Smoking status: Never   Smokeless tobacco: Never  Vaping Use   Vaping status: Never Used  Substance and Sexual Activity   Alcohol use: No    Alcohol/week: 0.0 standard drinks of alcohol   Drug use: No   Sexual activity: Not Currently    Partners: Male    Birth control/protection: Post-menopausal  Other Topics Concern   Not on file  Social History Narrative      Patient is disabled.   Education some college education.   Right handed.   Caffeine four cups of tea daily.   Social Drivers of Health   Financial Resource Strain: Patient Declined (03/11/2022)   Received from [facility], [facility]   Overall Financial Resource Strain (CARDIA)    Difficulty of Paying Living Expenses: Patient declined  Food Insecurity: Patient Declined (03/11/2022)   Received from [facility], [facility]   Hunger Vital Sign    Worried About Running Out of Food in the Last Year: Patient declined    Ran Out of Food in the Last Year: Patient declined  Transportation Needs: Patient Declined (03/11/2022)   Received from [facility], [facility]   PRAPARE - Transportation    Lack of Transportation (Medical): Patient declined    Lack of Transportation (Non-Medical): Patient declined  Physical Activity: Sufficiently Active (08/18/2019)   Received from [facility], [facility]   Exercise Vital Sign    Days of Exercise per Week: 7 days    Minutes  of Exercise per Session: 30 min  Stress: No Stress Concern Present (08/18/2019)   Received from [employer], [facility]   [employer] of Occupational Health - Occupational Stress Questionnaire    Feeling of Stress : Only a little  Social Connections: Unknown (07/06/2021)   Received from [facility], [facility]   Social Network    Social Network: Not on file  Intimate Partner Violence: Unknown (06/10/2021)   Received from [facility], [facility]   HITS    Physically Hurt: Not on file    Insult or Talk Down To: Not on file    Threaten Physical Harm: Not on file    Scream or Curse: Not on file    Review of Systems  Genitourinary:  Positive for dysuria and vaginal pain.    PHYSICAL EXAMINATION:   BP 104/62   Pulse (!) 107   Temp 98.4 F (36.9 C)   Wt 160 lb 12.8 oz (72.9 kg)   LMP 02/11/2008   SpO2 99%   BMI 28.48 kg/m     General appearance: alert, cooperative and appears stated age   Pelvic: External genitalia:  rosy erythema of vulva.              Urethra:  normal appearing urethra with no masses, tenderness or lesions              Bartholins and Skenes: normal                 Vagina: normal appearing vagina with normal color and discharge, no lesions              Cervix: no lesions                Bimanual Exam:  Uterus:  normal size, contour, position, consistency, mobility, non-tender              Adnexa: no  mass, fullness, tenderness        Chaperone was present for exam:  Chrystie Crass, CMA  ASSESSMENT:  Dysuria. Vulvar irritation.  Hx breast cancer.  Hx E Coli sepsis.  Hx renal stones.   PLAN:  Urinalysis and reflex cx. Urine sg 1.025, ph 5.5, 20 - 40 WBC, 0 - 2 RBC, 10 - 20 epis, mod bacteria. Bactrim  DS po bid x 3. Declines pyridium .  Mycolog II.  Vaginitis testing.  Fu prn.

## 2023-04-20 LAB — CERVICOVAGINAL ANCILLARY ONLY
Bacterial Vaginitis (gardnerella): NEGATIVE
Candida Glabrata: NEGATIVE
Candida Vaginitis: NEGATIVE
Comment: NEGATIVE
Comment: NEGATIVE
Comment: NEGATIVE
Comment: NEGATIVE
Trichomonas: NEGATIVE

## 2023-04-21 LAB — URINALYSIS, COMPLETE W/RFL CULTURE
Bilirubin Urine: NEGATIVE
Glucose, UA: NEGATIVE
Hgb urine dipstick: NEGATIVE
Hyaline Cast: NONE SEEN /[LPF]
Ketones, ur: NEGATIVE
Nitrites, Initial: NEGATIVE
Protein, ur: NEGATIVE
Specific Gravity, Urine: 1.025 (ref 1.001–1.035)
pH: 5.5 (ref 5.0–8.0)

## 2023-04-21 LAB — URINE CULTURE
MICRO NUMBER:: 16063099
Result:: NO GROWTH
SPECIMEN QUALITY:: ADEQUATE

## 2023-04-21 LAB — CULTURE INDICATED

## 2023-05-20 DIAGNOSIS — H04202 Unspecified epiphora, left lacrimal gland: Secondary | ICD-10-CM | POA: Diagnosis not present

## 2023-06-25 ENCOUNTER — Other Ambulatory Visit: Payer: Self-pay | Admitting: Adult Health

## 2023-06-28 ENCOUNTER — Telehealth: Payer: Self-pay

## 2023-06-28 ENCOUNTER — Other Ambulatory Visit: Payer: Self-pay

## 2023-06-28 MED ORDER — ANASTROZOLE 1 MG PO TABS: 1.0000 mg | ORAL_TABLET | Freq: Every day | ORAL | 3 refills | Status: AC

## 2023-06-28 NOTE — Telephone Encounter (Signed)
 Pt called to request refill for anastrozole  and states her phx told her they have attempted to reach us  several times regarding refill. Advised we have not received a refill request for anastrozole . Refill sent per MD and pt verbalized thanks.

## 2023-06-29 ENCOUNTER — Telehealth: Payer: Self-pay | Admitting: Hematology and Oncology

## 2023-06-29 NOTE — Telephone Encounter (Signed)
 Confirmed with pt scheduled appt date and time

## 2023-08-05 DIAGNOSIS — Z1330 Encounter for screening examination for mental health and behavioral disorders, unspecified: Secondary | ICD-10-CM | POA: Diagnosis not present

## 2023-08-05 DIAGNOSIS — G40219 Localization-related (focal) (partial) symptomatic epilepsy and epileptic syndromes with complex partial seizures, intractable, without status epilepticus: Secondary | ICD-10-CM | POA: Diagnosis not present

## 2023-08-05 DIAGNOSIS — M25559 Pain in unspecified hip: Secondary | ICD-10-CM | POA: Diagnosis not present

## 2023-08-06 DIAGNOSIS — M47816 Spondylosis without myelopathy or radiculopathy, lumbar region: Secondary | ICD-10-CM | POA: Diagnosis not present

## 2023-08-06 DIAGNOSIS — R52 Pain, unspecified: Secondary | ICD-10-CM | POA: Diagnosis not present

## 2023-08-06 DIAGNOSIS — M533 Sacrococcygeal disorders, not elsewhere classified: Secondary | ICD-10-CM | POA: Diagnosis not present

## 2023-08-07 DIAGNOSIS — Z170 Estrogen receptor positive status [ER+]: Secondary | ICD-10-CM | POA: Diagnosis not present

## 2023-08-07 DIAGNOSIS — C50512 Malignant neoplasm of lower-outer quadrant of left female breast: Secondary | ICD-10-CM | POA: Diagnosis not present

## 2023-08-07 DIAGNOSIS — E7800 Pure hypercholesterolemia, unspecified: Secondary | ICD-10-CM | POA: Diagnosis not present

## 2023-08-24 DIAGNOSIS — M5451 Vertebrogenic low back pain: Secondary | ICD-10-CM | POA: Diagnosis not present

## 2023-08-24 DIAGNOSIS — M25552 Pain in left hip: Secondary | ICD-10-CM | POA: Diagnosis not present

## 2023-08-24 DIAGNOSIS — M25551 Pain in right hip: Secondary | ICD-10-CM | POA: Diagnosis not present

## 2023-08-31 DIAGNOSIS — M25551 Pain in right hip: Secondary | ICD-10-CM | POA: Diagnosis not present

## 2023-08-31 DIAGNOSIS — M5451 Vertebrogenic low back pain: Secondary | ICD-10-CM | POA: Diagnosis not present

## 2023-08-31 DIAGNOSIS — M25552 Pain in left hip: Secondary | ICD-10-CM | POA: Diagnosis not present

## 2023-09-02 ENCOUNTER — Telehealth: Payer: Self-pay | Admitting: Pharmacist

## 2023-09-02 DIAGNOSIS — M25552 Pain in left hip: Secondary | ICD-10-CM | POA: Diagnosis not present

## 2023-09-02 DIAGNOSIS — M25551 Pain in right hip: Secondary | ICD-10-CM | POA: Diagnosis not present

## 2023-09-02 DIAGNOSIS — M5451 Vertebrogenic low back pain: Secondary | ICD-10-CM | POA: Diagnosis not present

## 2023-09-02 NOTE — Progress Notes (Signed)
   09/02/2023  Patient ID: Amber Bowman, female   DOB: 04-18-1955, 68 y.o.   MRN: [id number]  Patient appeared on insurance report for at-risk for failing 2025 metric: Medication Adherence for Cholesterol (MAC)    Medication: Atorvastatin 10mg  Last fill date: 1/22 90DS  Fail date: 09/05/23 (not yet)  Patient needs refills on the script. Requested on 06/29/23, but denied due to refill too soon.  Therefore patient will fail if not filled and picked up by 09/05/23. Sending message to Dr. Chrystal.    Aloysius Lewis, PharmD [facility]  Phone Number: [phone]

## 2023-09-06 ENCOUNTER — Ambulatory Visit: Payer: Medicare HMO | Admitting: Urology

## 2023-09-06 DIAGNOSIS — Z1211 Encounter for screening for malignant neoplasm of colon: Secondary | ICD-10-CM | POA: Diagnosis not present

## 2023-09-06 DIAGNOSIS — Z170 Estrogen receptor positive status [ER+]: Secondary | ICD-10-CM | POA: Diagnosis not present

## 2023-09-06 DIAGNOSIS — E7800 Pure hypercholesterolemia, unspecified: Secondary | ICD-10-CM | POA: Diagnosis not present

## 2023-09-06 DIAGNOSIS — C50512 Malignant neoplasm of lower-outer quadrant of left female breast: Secondary | ICD-10-CM | POA: Diagnosis not present

## 2023-09-08 DIAGNOSIS — M5451 Vertebrogenic low back pain: Secondary | ICD-10-CM | POA: Diagnosis not present

## 2023-09-08 DIAGNOSIS — M25551 Pain in right hip: Secondary | ICD-10-CM | POA: Diagnosis not present

## 2023-09-08 DIAGNOSIS — M25552 Pain in left hip: Secondary | ICD-10-CM | POA: Diagnosis not present

## 2023-09-15 DIAGNOSIS — M25552 Pain in left hip: Secondary | ICD-10-CM | POA: Diagnosis not present

## 2023-09-15 DIAGNOSIS — M25551 Pain in right hip: Secondary | ICD-10-CM | POA: Diagnosis not present

## 2023-09-15 DIAGNOSIS — M5451 Vertebrogenic low back pain: Secondary | ICD-10-CM | POA: Diagnosis not present

## 2023-09-17 DIAGNOSIS — M533 Sacrococcygeal disorders, not elsewhere classified: Secondary | ICD-10-CM | POA: Diagnosis not present

## 2023-09-17 DIAGNOSIS — M7061 Trochanteric bursitis, right hip: Secondary | ICD-10-CM | POA: Diagnosis not present

## 2023-09-21 ENCOUNTER — Other Ambulatory Visit: Payer: Self-pay | Admitting: Adult Health

## 2023-09-21 DIAGNOSIS — Z853 Personal history of malignant neoplasm of breast: Secondary | ICD-10-CM

## 2023-09-28 ENCOUNTER — Telehealth: Payer: Self-pay

## 2023-09-28 NOTE — Telephone Encounter (Signed)
 Patient scheduled.

## 2023-09-28 NOTE — Telephone Encounter (Signed)
 Please offer her appt with me Friday at 8:30am

## 2023-09-28 NOTE — Telephone Encounter (Signed)
 BS Patient called and stated she is having some vaginal burning and spotting when she wipes. She states that the blood is only when she wipes. She says that this is the second time in a week that she has had these symptoms. Patient is not sexually active. She denies pain except for the external burning.  There are no appointments for the next week. Please advise.

## 2023-09-29 ENCOUNTER — Encounter: Payer: Self-pay | Admitting: Radiology

## 2023-09-29 ENCOUNTER — Ambulatory Visit (INDEPENDENT_AMBULATORY_CARE_PROVIDER_SITE_OTHER): Admitting: Radiology

## 2023-09-29 VITALS — BP 122/80 | Wt 159.6 lb

## 2023-09-29 DIAGNOSIS — N952 Postmenopausal atrophic vaginitis: Secondary | ICD-10-CM

## 2023-09-29 DIAGNOSIS — N958 Other specified menopausal and perimenopausal disorders: Secondary | ICD-10-CM | POA: Diagnosis not present

## 2023-09-29 LAB — WET PREP FOR TRICH, YEAST, CLUE

## 2023-09-29 MED ORDER — ESTRADIOL 0.1 MG/GM VA CREA: 1.0000 g | TOPICAL_CREAM | VAGINAL | 3 refills | Status: AC

## 2023-09-29 NOTE — Progress Notes (Signed)
      Subjective: Amber Bowman is a 68 y.o. female who complains of vaginal bleeding, noticed after wiping 1 week ago, also with external vulvar burning. Tried monistat 7 last week without relief. Tried Nystatin  yesterday without relief. Currently taking Amoxicillin 500mg  for dental problem.    Review of Systems  All other systems reviewed and are negative.   Past Medical History:  Diagnosis Date   Anemia    Breast cancer (HCC)    Cancer (HCC)    left breast   Cardiomyopathy (HCC) 06/08/2019   Pt denies   CHF (congestive heart failure) (HCC) 07/2019   per denies this dx   Colon polyps 2014   Condyloma    Depression    Epilepsy (HCC)    last seizure reported was 9//2023   GERD (gastroesophageal reflux disease)    Osteopenia 04/2018   T score -1.8 FRAX 9.5% / 1.1% overall stable from prior DEXA.  Patient denies   Personal history of chemotherapy    Personal history of radiation therapy    Seizures (HCC)    last on 11/2021,  few seizures per month  per husband   Thrombocytopenia (HCC)    Pt denies      Objective:  Today's Vitals   09/29/23 0939  BP: 122/80  Weight: 159 lb 9.6 oz (72.4 kg)   Body mass index is 28.27 kg/m.   Physical Exam Vitals and nursing note reviewed. Exam conducted with a chaperone present.  Constitutional:      Appearance: Normal appearance. She is well-developed.  Pulmonary:     Effort: Pulmonary effort is normal.  Abdominal:     General: Abdomen is flat.     Palpations: Abdomen is soft.  Genitourinary:    General: Normal vulva.     Urethra: Urethral swelling (and erythema) present.     Vagina: Vaginal discharge and erythema (severe atrophy) present. No bleeding or lesions.     Cervix: Normal. No discharge, friability, lesion or erythema.     Uterus: Normal.      Adnexa: Right adnexa normal and left adnexa normal.  Neurological:     Mental Status: She is alert.  Psychiatric:        Mood and Affect: Mood normal.        Thought  Content: Thought content normal.        Judgment: Judgment normal.      Microscopic wet-mount exam shows negative for pathogens, normal epithelial cells.   Darice Hoit, CMA present for exam  Assessment:/Plan:   1. Atrophic vaginitis (Primary) - WET PREP FOR TRICH, YEAST, CLUE  2. Genitourinary syndrome of menopause Will begin vagina estrogen, reassured safe with her cancer history, Vaginal estrogen is not systemically absorbed. Will alternated with coconut oil or key-E suppositories  - estradiol  (ESTRACE  VAGINAL) 0.1 MG/GM vaginal cream; Place 1 g vaginally 3 (three) times a week.  Dispense: 42.5 g; Refill: 3    Avoid intercourse until symptoms are resolved. Safe sex encouraged. Avoid the use of soaps or perfumed products in the peri area. Avoid tub baths and sitting in sweaty or wet clothing for prolonged periods of time.     Makel Mcmann B, NP 9:58 AM

## 2023-10-06 ENCOUNTER — Ambulatory Visit: Admitting: Urology

## 2023-10-07 DIAGNOSIS — C50512 Malignant neoplasm of lower-outer quadrant of left female breast: Secondary | ICD-10-CM | POA: Diagnosis not present

## 2023-10-07 DIAGNOSIS — Z170 Estrogen receptor positive status [ER+]: Secondary | ICD-10-CM | POA: Diagnosis not present

## 2023-10-07 DIAGNOSIS — E7800 Pure hypercholesterolemia, unspecified: Secondary | ICD-10-CM | POA: Diagnosis not present

## 2023-10-21 ENCOUNTER — Encounter: Payer: Self-pay | Admitting: Urology

## 2023-10-21 ENCOUNTER — Ambulatory Visit (INDEPENDENT_AMBULATORY_CARE_PROVIDER_SITE_OTHER): Admitting: Urology

## 2023-10-21 VITALS — BP 122/79 | HR 71 | Ht 63.0 in | Wt 159.0 lb

## 2023-10-21 DIAGNOSIS — N200 Calculus of kidney: Secondary | ICD-10-CM

## 2023-10-21 DIAGNOSIS — Z87440 Personal history of urinary (tract) infections: Secondary | ICD-10-CM | POA: Diagnosis not present

## 2023-10-21 LAB — URINALYSIS, ROUTINE W REFLEX MICROSCOPIC
Bilirubin, UA: NEGATIVE
Glucose, UA: NEGATIVE
Ketones, UA: NEGATIVE
Nitrite, UA: NEGATIVE
Protein,UA: NEGATIVE
Specific Gravity, UA: 1.015 (ref 1.005–1.030)
Urobilinogen, Ur: 0.2 mg/dL (ref 0.2–1.0)
pH, UA: 6 (ref 5.0–7.5)

## 2023-10-21 LAB — MICROSCOPIC EXAMINATION

## 2023-10-21 NOTE — Progress Notes (Signed)
 Assessment: 1. Nephrolithiasis   2. History of UTI     Plan: Continue stone prevention. Recommend continuing vaginal hormone replacement. Return to office in 1 year with KUB  Chief Complaint: Chief Complaint  Patient presents with   Nephrolithiasis    HPI: Amber Bowman is a 68 y.o. female who presents for continued evaluation of bilateral ureteral calculi and nephrolithiasis.  She presented on 04/01/22 with a 3-week history of left back pain with associated frequency, urgency, and dysuria. Her urinalysis was suspicious for a UTI on 03/23/2022. Urine culture grew >100 K E. coli. She received Rocephin and Cipro on 03/25/2022. She completed the Cipro on 03/31/22. She was evaluated with CT imaging on 03/31/2022 which showed a 4 mm calculus in the left distal ureter with associated obstruction and an 8 mm calculus in the right renal pelvis near the right UPJ without significant obstruction. Treatment options were discussed with the patient. She underwent cystoscopy, bilateral retrograde pyelograms, left ureteroscopy, stone manipulation, and insertion of bilateral ureteral stents on 04/01/22. A staged procedure for the right proximal calculus with stent placement  followed by shockwave lithotripsy in the near future was recommended for the proximal right ureteral calculus. At her visit on 04/09/2022, she continued on her cefdinir.  She reported low back pain as well as discomfort with voiding.  No fevers or chills.  No gross hematuria.  No flank pain.  She was not taking Pyridium on a regular basis. Her urinalysis was nitrite positive.  Planned stent removal was postponed due to a possible UTI.  She was given 1 g of Rocephin IM and was changed to Cipro twice daily x 7 days. Urine culture grew <10K colonies. Her left ureteral stent was removed on 04/14/22. She underwent right ESL on 04/20/22. KUB from 04/28/2022 showed a calcification measuring approximately 6 mm along the proximal aspect of the right  ureteral stent as well as multiple fragments along the mid to distal portion of the stent.  Treatment options for the proximal ureteral calculus discussed. She underwent right ESL on 05/04/22 for a proximal right ureteral calculus. Her stent was removed on 05/07/22.  Stone analysis: 20% calcium oxalate monohydrate, 80% calcium phosphate  At her visit in 3/24, she was doing very well following the stent removal.  She had passed a number of stone fragments without difficulty.  No flank pain.  She was not having any lower urinary tract symptoms. KUB from 3/24 did not show any obvious calcifications along the expected course of the right ureter. Renal ultrasound from 4/24 showed no evidence of hydronephrosis.  At her visit in 12/24, she reported low back pain and dysuria for 1 week.  She reported pain in the right low back area which is intermittent in nature.  This improved with standing.  No flank pain.  She also reported burning in the vaginal area.  She took AZO with some improvement.  She had also been using Monistat vaginal cream with improvement in the burning.  No vaginal discharge. KUB from 03/09/2023 showed faintly visible left renal calculi. She has been evaluated by gynecology and diagnosed with atrophic vaginitis.  She has been started on vaginal estrogen cream.  She returns today for follow-up.  She is not having any stone symptoms.  No dysuria or gross hematuria. No flank pain.  She has noted improvement in her vaginal burning with Estrace cream.  Portions of the above documentation were copied from a prior visit for review purposes only.  Allergies: No Known  Allergies  PMH: Past Medical History:  Diagnosis Date   Anemia    Breast cancer (HCC)    Cancer (HCC)    left breast   Cardiomyopathy (HCC) 06/08/2019   Pt denies   CHF (congestive heart failure) (HCC) 07/2019   per denies this dx   Colon polyps 2014   Condyloma    Depression    Epilepsy (HCC)    last seizure  reported was 9//2023   GERD (gastroesophageal reflux disease)    Osteopenia 04/2018   T score -1.8 FRAX 9.5% / 1.1% overall stable from prior DEXA.  Patient denies   Personal history of chemotherapy    Personal history of radiation therapy    Seizures (HCC)    last on 11/2021,  few seizures per month  per husband   Thrombocytopenia (HCC)    Pt denies    PSH: Past Surgical History:  Procedure Laterality Date   BREAST LUMPECTOMY     BREAST LUMPECTOMY WITH RADIOACTIVE SEED AND SENTINEL LYMPH NODE BIOPSY Left 12/08/2021   Procedure: LEFT BREAST LUMPECTOMY WITH RADIOACTIVE SEED AND SENTINEL LYMPH NODE BIOPSY;  Surgeon: Curvin Deward MOULD, MD;  Location: [facility];  Service: General;  Laterality: Left;   CATARACT EXTRACTION Bilateral 03/10/2015   CYSTOSCOPY W/ RETROGRADES Right 06/25/2021   Procedure: CYSTOSCOPY WITH RIGHT RETROGRADE PYELOGRAM, REMOVAL OF BLADDER STONE;  Surgeon: Renda Glance, MD;  Location: [facility];  Service: Urology;  Laterality: Right;   CYSTOSCOPY WITH RETROGRADE PYELOGRAM, URETEROSCOPY AND STENT PLACEMENT Bilateral 04/01/2022   Procedure: CYSTOSCOPY WITH BILATERAL RETROGRADE PYELOGRAM, LEFT URETEROSCOPY LASER STONE MANIPulation AND BILATERAL STENT PLACEMENT;  Surgeon: Roseann Adine PARAS., MD;  Location: [facility];  Service: Urology;  Laterality: Bilateral;   CYSTOSCOPY/URETEROSCOPY/HOLMIUM LASER/STENT PLACEMENT Left 09/04/2019   Procedure: CYSTOSCOPY/RETROGRADE/ URETEROSCOPY/HOLMIUM LASER/STENT PLACEMENT/ REMOVAL OF NEPHROSTOMY TUBE;  Surgeon: Renda Glance, MD;  Location: [facility];  Service: Urology;  Laterality: Left;   EXTRACORPOREAL SHOCK WAVE LITHOTRIPSY Right 04/20/2022   Procedure: EXTRACORPOREAL SHOCK WAVE LITHOTRIPSY (ESWL);  Surgeon: Roseann Adine PARAS., MD;  Location: [facility];  Service: Urology;  Laterality: Right;   EXTRACORPOREAL SHOCK WAVE LITHOTRIPSY Right 05/04/2022   Procedure: EXTRACORPOREAL SHOCK WAVE LITHOTRIPSY (ESWL);  Surgeon: Shona Layman BROCKS, MD;  Location: [facility];  Service: Urology;  Laterality: Right;   IR NEPHROSTOMY EXCHANGE LEFT  08/17/2019   IR NEPHROSTOMY PLACEMENT LEFT  06/28/2019    SH: Social History   Tobacco Use   Smoking status: Never   Smokeless tobacco: Never  Vaping Use   Vaping status: Never Used  Substance Use Topics   Alcohol use: No    Alcohol/week: 0.0 standard drinks of alcohol   Drug use: No    ROS: Constitutional:  Negative for fever, chills, weight loss CV: Negative for chest pain, previous MI, hypertension Respiratory:  Negative for shortness of breath, wheezing, sleep apnea, frequent cough GI:  Negative for nausea, vomiting, bloody stool, GERD  PE: BP 122/79   Pulse 71   Ht 5' 3 (1.6 m)   Wt 159 lb (72.1 kg)   LMP 02/11/2008   BMI 28.17 kg/m  GENERAL APPEARANCE:  Well appearing, well developed, well nourished, NAD HEENT:  Atraumatic, normocephalic, oropharynx clear NECK:  Supple without lymphadenopathy or thyromegaly ABDOMEN:  Soft, non-tender, no masses EXTREMITIES:  Moves all extremities well, without clubbing, cyanosis, or edema NEUROLOGIC:  Alert and oriented x 3, normal gait, CN II-XII grossly intact MENTAL STATUS:  appropriate BACK:  Non-tender to palpation, No CVAT  SKIN:  Warm, dry, and intact   Results: U/A: 6-10 WBCs, 0-2 RBCs, moderate bacteria

## 2023-11-01 ENCOUNTER — Ambulatory Visit
Admission: RE | Admit: 2023-11-01 | Discharge: 2023-11-01 | Disposition: A | Discharge status: Home / Self Care | Source: Ambulatory Visit | Attending: Adult Health | Admitting: Adult Health

## 2023-11-01 DIAGNOSIS — Z853 Personal history of malignant neoplasm of breast: Secondary | ICD-10-CM

## 2023-11-01 DIAGNOSIS — R928 Other abnormal and inconclusive findings on diagnostic imaging of breast: Secondary | ICD-10-CM | POA: Diagnosis not present

## 2023-11-22 ENCOUNTER — Inpatient Hospital Stay: Attending: Hematology and Oncology | Admitting: Hematology and Oncology

## 2023-11-22 VITALS — BP 133/67 | HR 60 | Temp 97.7°F | Resp 17 | Ht 63.0 in | Wt 161.2 lb

## 2023-11-22 DIAGNOSIS — Z923 Personal history of irradiation: Secondary | ICD-10-CM | POA: Insufficient documentation

## 2023-11-22 DIAGNOSIS — Z1732 Human epidermal growth factor receptor 2 negative status: Secondary | ICD-10-CM | POA: Insufficient documentation

## 2023-11-22 DIAGNOSIS — M8580 Other specified disorders of bone density and structure, unspecified site: Secondary | ICD-10-CM | POA: Insufficient documentation

## 2023-11-22 DIAGNOSIS — C50512 Malignant neoplasm of lower-outer quadrant of left female breast: Secondary | ICD-10-CM | POA: Insufficient documentation

## 2023-11-22 DIAGNOSIS — Z79811 Long term (current) use of aromatase inhibitors: Secondary | ICD-10-CM | POA: Insufficient documentation

## 2023-11-22 DIAGNOSIS — Z1722 Progesterone receptor negative status: Secondary | ICD-10-CM | POA: Insufficient documentation

## 2023-11-22 DIAGNOSIS — Z170 Estrogen receptor positive status [ER+]: Secondary | ICD-10-CM | POA: Diagnosis not present

## 2023-11-22 NOTE — Assessment & Plan Note (Signed)
 11/20/2021:Screening mammogram detected left breast distortion.  Ultrasound revealed 2 areas of abnormality both of which were biopsied and were found to be benign fibroadenoma and fibrocystic change.  Stereotactic biopsy of the distortion revealed grade 1 IDC with DCIS ER 98%, PR 0%, Ki-67 10%, HER2 negative   12/08/2021: Left lumpectomy: Grade 1 IDC 0.9 cm, intermediate grade DCIS, margins negative, lymphovascular invasion present, 1/6 lymph nodes positive with nodal extension, ER 98%, PR 0%, HER2 negative 1+, Ki-67 10%   Oncotype DX testing: Score 22 (ROR 18%) no chemotherapy  Adjuvant radiation therapy: 01/13/2022-02/23/2022   Treatment plan: Anastrozole  1 mg p.o. daily started 03/09/2022   Anastrozole  toxicities: Mild hot flashes but no different than before. Denies any joint aches or pains.   Breast cancer surveillance: Breast exam 11/22/2023: Benign Mammogram 11/01/2023: Benign breast density category B   Return to clinic in 1 year for follow-up

## 2023-11-22 NOTE — Progress Notes (Signed)
 Patient Care Team: Chrystal Lamarr RAMAN, MD as PCP - General (Family Medicine) Curvin Deward MOULD, MD as Consulting Physician (General Surgery) Odean Potts, MD as Consulting Physician (Hematology and Oncology) Izell Domino, MD as Attending Physician (Radiation Oncology)  DIAGNOSIS:  Encounter Diagnosis  Name Primary?   Malignant neoplasm of lower-outer quadrant of left breast of female, estrogen receptor positive (HCC) Yes    SUMMARY OF ONCOLOGIC HISTORY: Oncology History  Malignant neoplasm of lower-outer quadrant of left breast of female, estrogen receptor positive (HCC)  11/20/2021 Initial Diagnosis   Screening mammogram detected left breast distortion.  Ultrasound revealed 2 areas of abnormality both of which were biopsied and were found to be benign fibroadenoma and fibrocystic change.  Stereotactic biopsy of the distortion revealed grade 1 IDC with DCIS ER 98%, PR 0%, Ki-67 10%, HER2 negative   11/26/2021 Cancer Staging   Staging form: Breast, AJCC 8th Edition - Clinical stage from 11/26/2021: Stage IA (cT1c, cN0, cM0, G1, ER+, PR-, HER2-) - Signed by Odean Potts, MD on 11/26/2021 Stage prefix: Initial diagnosis Histologic grading system: 3 grade system   12/08/2021 Surgery   Left lumpectomy: Grade 1 IDC 0.9 cm, intermediate grade DCIS, margins negative, lymphovascular invasion present, 1/6 lymph nodes positive with nodal extension, ER 98%, PR 0%, HER2 negative 1+, Ki-67 10%      12/08/2021 Oncotype testing   Oncotype DX testing: Score 22 (ROR 18%) no chemotherapy    01/12/2022 - 02/23/2022 Radiation Therapy   Plan Name: Breast_L Site: Breast, Left Technique: 3D Mode: Photon Dose Per Fraction: 2 Gy Prescribed Dose (Delivered / Prescribed): 50 Gy / 50 Gy Prescribed Fxs (Delivered / Prescribed): 25 / 25   Plan Name: Breast_L_SCV Site: Breast, Left Technique: 3D Mode: Photon Dose Per Fraction: 2 Gy Prescribed Dose (Delivered / Prescribed): 50 Gy / 50 Gy Prescribed Fxs  (Delivered / Prescribed): 25 / 25   Plan Name: Breast_L_Bst Site: Breast, Left Technique: 3D Mode: Photon Dose Per Fraction: 2 Gy Prescribed Dose (Delivered / Prescribed): 10 Gy / 10 Gy Prescribed Fxs (Delivered / Prescribed): 5 / 5   02/2022 -  Anti-estrogen oral therapy   Anastrozole  x 7 years     CHIEF COMPLIANT:   HISTORY OF PRESENT ILLNESS:   History of Present Illness Amber Bowman is a 68 year old female with breast cancer who presents for a routine follow-up.  She has been on anastrozole  for approximately a year and a half and experiences occasional hot flashes every other night, which are not significantly bothersome. Her recent mammogram results were satisfactory. A bone density test was conducted about two months ago. She is currently taking vitamin D  and calcium  supplements. She maintains an active lifestyle, walking about three miles daily.     ALLERGIES:  has no known allergies.  MEDICATIONS:  Current Outpatient Medications  Medication Sig Dispense Refill   anastrozole  (ARIMIDEX ) 1 MG tablet Take 1 tablet (1 mg total) by mouth daily. 90 tablet 3   atorvastatin (LIPITOR) 10 MG tablet Take 10 mg by mouth at bedtime.     betamethasone  valerate ointment (VALISONE ) 0.1 % Apply 1 Application topically.     Cholecalciferol 50 MCG (2000 UT) TABS 1 tablet Orally Once a day     CRANBERRY PO Take by mouth.     lidocaine  (XYLOCAINE ) 2 % jelly Apply topically.     naproxen sodium (ALEVE) 220 MG tablet 1 tablet with food or milk as needed Orally every 12 hrs  amoxicillin (AMOXIL) 500 MG capsule Take 500 mg by mouth 3 (three) times daily. (Patient not taking: Reported on 11/22/2023)     estradiol  (ESTRACE  VAGINAL) 0.1 MG/GM vaginal cream Place 1 g vaginally 3 (three) times a week. (Patient not taking: Reported on 11/22/2023) 42.5 g 3   felbamate  (FELBATOL ) 600 MG tablet Take 0.5 tablets (300 mg total) by mouth at bedtime. 30 tablet 11   ketoconazole  (NIZORAL ) 2 %  cream APPLY TO AFFECTED AREA TOPICALLY DAILY 15 g 0   levETIRAcetam  (KEPPRA ) 500 MG tablet TAKE 1 TABLET BY MOUTH TWICE A DAY (Patient taking differently: 2 (two) times daily.) 180 tablet 0   NONFORMULARY OR COMPOUNDED ITEM Apply 1 application  topically See admin instructions. Compounded cream (Amitriptyline -Gabapentin -Lidocaine  Cream 5/10/5%)- Apply to the lips 4 times a day as needed/as directed     nystatin  ointment (MYCOSTATIN ) Apply 1 Application topically 2 (two) times daily. Apply 1 Application topically 2 (two) times daily. Use for two weeks at a time as needed. 30 g 0   Omega-3 Fatty Acids (FISH OIL) 1200 MG CAPS 1 capsule Orally Twice a day     omeprazole (PRILOSEC) 40 MG capsule Take 40 mg by mouth daily before breakfast.     Propylene Glycol, PF, (SYSTANE COMPLETE PF) 0.6 % SOLN Place 1 drop into both eyes daily as needed (for dryness).     sulfamethoxazole -trimethoprim  (BACTRIM  DS) 800-160 MG tablet Take 1 tablet by mouth 2 (two) times daily. One PO BID x 3 days (Patient not taking: Reported on 10/21/2023) 6 tablet 0   triamcinolone  (KENALOG ) 0.025 % ointment Apply 1 Application topically 2 (two) times daily. Use for two weeks at a time as needed. 30 g 0   Turmeric Curcumin 07-998 MG CAPS 1/2 capsule Orally twice a day     zonisamide  (ZONEGRAN ) 50 MG capsule Take 50 mg by mouth daily.     No current facility-administered medications for this visit.    PHYSICAL EXAMINATION: ECOG PERFORMANCE STATUS: 1 - Symptomatic but completely ambulatory  Vitals:   11/22/23 0942  BP: 133/67  Pulse: 60  Resp: 17  Temp: 97.7 F (36.5 C)  SpO2: 98%   Filed Weights   11/22/23 0942  Weight: 161 lb 3 oz (73.1 kg)    Physical Exam   (exam performed in the presence of a chaperone)  LABORATORY DATA:  I have reviewed the data as listed    Latest Ref Rng & Units 04/01/2022    4:50 PM 11/26/2021   12:06 PM 06/25/2021    2:14 PM  CMP  Glucose 70 - 99 mg/dL 870  97  895   BUN 8 - 23 mg/dL  19  15  29    Creatinine 0.44 - 1.00 mg/dL 8.97  9.04  8.39   Sodium 135 - 145 mmol/L 135  142  138   Potassium 3.5 - 5.1 mmol/L 3.9  4.5  3.7   Chloride 98 - 111 mmol/L 105  112  108   CO2 22 - 32 mmol/L 22  25    Calcium  8.9 - 10.3 mg/dL 9.4  9.0    Total Protein 6.5 - 8.1 g/dL  6.9    Total Bilirubin 0.3 - 1.2 mg/dL  0.3    Alkaline Phos 38 - 126 U/L  114    AST 15 - 41 U/L  14    ALT 0 - 44 U/L  13      Lab Results  Component Value Date  WBC 14.8 (H) 04/01/2022   HGB 12.1 04/01/2022   HCT 38.0 04/01/2022   MCV 93.1 04/01/2022   PLT 426 (H) 04/01/2022   NEUTROABS 12.0 (H) 04/01/2022    ASSESSMENT & PLAN:  Malignant neoplasm of lower-outer quadrant of left breast of female, estrogen receptor positive (HCC) 11/20/2021:Screening mammogram detected left breast distortion.  Ultrasound revealed 2 areas of abnormality both of which were biopsied and were found to be benign fibroadenoma and fibrocystic change.  Stereotactic biopsy of the distortion revealed grade 1 IDC with DCIS ER 98%, PR 0%, Ki-67 10%, HER2 negative   12/08/2021: Left lumpectomy: Grade 1 IDC 0.9 cm, intermediate grade DCIS, margins negative, lymphovascular invasion present, 1/6 lymph nodes positive with nodal extension, ER 98%, PR 0%, HER2 negative 1+, Ki-67 10%   Oncotype DX testing: Score 22 (ROR 18%) no chemotherapy  Adjuvant radiation therapy: 01/13/2022-02/23/2022   Treatment plan: Anastrozole  1 mg p.o. daily started 03/09/2022   Anastrozole  toxicities: Mild hot flashes but no different than before. Denies any joint aches or pains.   Breast cancer surveillance: Breast exam 11/22/2023: Benign Mammogram 11/01/2023: Benign breast density category B   Return to clinic in 1 year for follow-up   Assessment & Plan Estrogen receptor positive malignant neoplasm of lower-outer quadrant of left breast Two years post-treatment with ongoing anastrozole  therapy. Recent mammograms satisfactory, no recurrence concerns. -  Continue anastrozole  therapy. - Ensure medication refills until next year.  Osteopenia Bone density scan shows osteopenia with a tendency towards osteoporosis. Fracture probability in ten years is twelve percent. Discussed bisphosphonates but opted for monitoring. - Continue vitamin D  and calcium  supplementation. - Continue regular physical activity, including walking. - Monitor bone density and reassess pharmacological intervention need.      No orders of the defined types were placed in this encounter.  The patient has a good understanding of the overall plan. she agrees with it. she will call with any problems that may develop before the next visit here. Total time spent: 30 mins including face to face time and time spent for planning, charting and co-ordination of care   Naomi MARLA Chad, MD 11/22/23

## 2023-12-07 DIAGNOSIS — E7800 Pure hypercholesterolemia, unspecified: Secondary | ICD-10-CM | POA: Diagnosis not present

## 2023-12-07 DIAGNOSIS — Z170 Estrogen receptor positive status [ER+]: Secondary | ICD-10-CM | POA: Diagnosis not present

## 2023-12-07 DIAGNOSIS — C50512 Malignant neoplasm of lower-outer quadrant of left female breast: Secondary | ICD-10-CM | POA: Diagnosis not present

## 2024-01-07 DIAGNOSIS — C50512 Malignant neoplasm of lower-outer quadrant of left female breast: Secondary | ICD-10-CM | POA: Diagnosis not present

## 2024-01-07 DIAGNOSIS — Z170 Estrogen receptor positive status [ER+]: Secondary | ICD-10-CM | POA: Diagnosis not present

## 2024-01-07 DIAGNOSIS — E7800 Pure hypercholesterolemia, unspecified: Secondary | ICD-10-CM | POA: Diagnosis not present

## 2024-02-06 DIAGNOSIS — E7800 Pure hypercholesterolemia, unspecified: Secondary | ICD-10-CM | POA: Diagnosis not present

## 2024-02-06 DIAGNOSIS — C50512 Malignant neoplasm of lower-outer quadrant of left female breast: Secondary | ICD-10-CM | POA: Diagnosis not present

## 2024-02-06 DIAGNOSIS — Z170 Estrogen receptor positive status [ER+]: Secondary | ICD-10-CM | POA: Diagnosis not present

## 2024-02-08 DIAGNOSIS — G40219 Localization-related (focal) (partial) symptomatic epilepsy and epileptic syndromes with complex partial seizures, intractable, without status epilepticus: Secondary | ICD-10-CM | POA: Diagnosis not present

## 2024-10-19 ENCOUNTER — Ambulatory Visit: Admitting: Urology

## 2024-11-21 ENCOUNTER — Ambulatory Visit: Admitting: Hematology and Oncology
